# Patient Record
Sex: Female | Born: 1945 | Race: White | Hispanic: No | Smoking: Never smoker
Health system: Southern US, Community
[De-identification: ages and names within clinical notes are randomized; demographics above are authoritative.]

## PROBLEM LIST (undated history)

## (undated) DIAGNOSIS — J449 Chronic obstructive pulmonary disease, unspecified: Secondary | ICD-10-CM

## (undated) DIAGNOSIS — I509 Heart failure, unspecified: Secondary | ICD-10-CM

## (undated) DIAGNOSIS — N289 Disorder of kidney and ureter, unspecified: Secondary | ICD-10-CM

## (undated) DIAGNOSIS — I10 Essential (primary) hypertension: Secondary | ICD-10-CM

## (undated) DIAGNOSIS — I499 Cardiac arrhythmia, unspecified: Secondary | ICD-10-CM

## (undated) DIAGNOSIS — I219 Acute myocardial infarction, unspecified: Secondary | ICD-10-CM

## (undated) DIAGNOSIS — E119 Type 2 diabetes mellitus without complications: Secondary | ICD-10-CM

## (undated) HISTORY — DX: Essential (primary) hypertension: I10

## (undated) HISTORY — PX: ABDOMINAL HYSTERECTOMY: SHX81

## (undated) HISTORY — DX: Heart failure, unspecified: I50.9

## (undated) HISTORY — PX: CARDIAC SURGERY: SHX584

## (undated) HISTORY — DX: Cardiac arrhythmia, unspecified: I49.9

---

## 2004-11-03 ENCOUNTER — Emergency Department: Payer: Self-pay | Admitting: Internal Medicine

## 2004-11-05 ENCOUNTER — Emergency Department: Payer: Self-pay | Admitting: Emergency Medicine

## 2005-02-12 ENCOUNTER — Emergency Department: Payer: Self-pay | Admitting: Internal Medicine

## 2005-03-24 ENCOUNTER — Ambulatory Visit: Payer: Self-pay | Admitting: Family Medicine

## 2005-04-27 ENCOUNTER — Ambulatory Visit: Payer: Self-pay | Admitting: Internal Medicine

## 2006-03-02 ENCOUNTER — Emergency Department: Payer: Self-pay | Admitting: Emergency Medicine

## 2006-09-01 ENCOUNTER — Ambulatory Visit: Payer: Self-pay | Admitting: *Deleted

## 2006-09-14 ENCOUNTER — Inpatient Hospital Stay (HOSPITAL_COMMUNITY): Admission: RE | Admit: 2006-09-14 | Discharge: 2006-09-17 | Payer: Self-pay | Admitting: Cardiology

## 2006-10-04 ENCOUNTER — Encounter: Admission: RE | Admit: 2006-10-04 | Discharge: 2006-10-04 | Payer: Self-pay | Admitting: Neurology

## 2006-10-15 ENCOUNTER — Other Ambulatory Visit: Payer: Self-pay

## 2006-10-15 ENCOUNTER — Ambulatory Visit: Payer: Self-pay | Admitting: Cardiology

## 2006-11-05 ENCOUNTER — Inpatient Hospital Stay (HOSPITAL_COMMUNITY): Admission: RE | Admit: 2006-11-05 | Discharge: 2006-11-06 | Payer: Self-pay | Admitting: Cardiology

## 2008-11-26 DIAGNOSIS — I10 Essential (primary) hypertension: Secondary | ICD-10-CM | POA: Insufficient documentation

## 2009-09-14 ENCOUNTER — Emergency Department: Payer: Self-pay | Admitting: Emergency Medicine

## 2009-09-23 ENCOUNTER — Inpatient Hospital Stay: Payer: Self-pay | Admitting: Specialist

## 2010-11-08 NOTE — Op Note (Signed)
[name], [name]              ACCOUNT NO.:  [id number]   MEDICAL RECORD NO.:  [id number]          PATIENT TYPE:  INP   LOCATION:  6526                         FACILITY:  [facility]   PHYSICIAN:  Cristy Hilts. Jacinto Halim, MD       DATE OF BIRTH:  03/26/1946   DATE OF PROCEDURE:  11/05/2006  DATE OF DISCHARGE:                               OPERATIVE REPORT   PROCEDURES PERFORMED:  1. Right subclavian arteriogram with right vertebral arteriogram.  2. Left subclavian arteriogram.  3. Left vertebral arteriogram.  4. Percutaneous transluminal angioplasty and stenting of the left      subclavian artery.  5. Percutaneous transluminal angioplasty and stenting of the left      vertebral artery.   INDICATIONS:  Andrea [name] is a 65 year old female with a history  of hypertension and hyperlipidemia, who has had symptoms of claudication  in the left upper extremity.  She had previously undergone a selective  left subclavian arteriogram, which had revealed a high-grade stenosis  with a 15 mm pressure gradient across the stenosis.  The stenosis was  very complex, involving the bifurcation of vertebral artery.  She also  has significant symptoms of claudication.  Given this, she was brought  to the catheterization lab with an eye towards percutaneous  revascularization of the left subclavian artery.   Left vertebral artery was compromised with stenting with a high-grade  90% stenosis.  Given this, we proceeded to electively stent the left  vertebral artery.   OPERATOR:  1. Primary operator Cristy Hilts. Jacinto Halim, MD, for angiography.  2. Primary operator for left vertebral stenting:  Nanetta Batty, M.D.  3. Proctoring physician is Grandville Silos. Corliss Skains, M.D.   ANGIOGRAPHIC DATA:  Right vertebral artery:  Right vertebral artery is a  large vessel and is dominant vessel.  Right subclavian artery is widely  patent.   Left vertebral artery:  Left vertebral artery is smaller and appears to  be nondominant.   However, there is a ostial 40% stenosis.  Following  balloon angioplasty, there was a high-grade 90% stenosis.   Left subclavian arteriogram:  Left subclavian arteriogram revealed a 70%  stenosis of the left subclavian artery.  The LIMA arose in the usual  fashion.  The stenosis was in the body of the subclavian artery.   INTERVENTION DATA:  Successful PTA and stenting of the left subclavian  artery with a 7.0 x 24 mm Genesis stent, which was deployed at 11  atmospheres for 42 seconds.  Post stent deployment, stenosis was reduced  to 0%.  However, there was a 90% stenosis noted in the left vertebral  artery.   Successful PTA and stenting of the left vertebral artery with  implantation of a 3.5 x 8-mm Vision performed at 10 atmospheres pressure  for 30 seconds.  Post stent deployment 0% residual stenosis was noted.  There was a spasm noted in the midsegment of the vertebral artery  secondary to the wire; however, this was all.  There was no evidence of  any dissection.   RECOMMENDATIONS:  The patient will be continued on aggressive risk  modification.   A total of 245 mL of contrast was utilized for diagnostic angiography.   TECHNIQUE OF THE PROCEDURE:  Diagnostic cardiac catheterization using a  7-French long bright tip 90 cm sheath.  This sheath was carefully  advanced over a Wholey wire into the left subclavian artery using a 5-  [place] JR-4 diagnostic catheter.  Then angiography was performed.  Then  we proceeded to electively stent the left subclavian artery.  This was  done over the [facility] wire and a 7.0 x 24 mm Genesis stent was advanced  at the site of the lesion and the stent was deployed at 11 atmospheres  pressure for 42 seconds.  Then post stent deployment angiography  revealed a high-grade stenosis of the right vertebral artery.  Then  intra-arterial nitroglycerin was administered and the attention was  directed towards the right subclavian artery and the right  vertebral  artery.  Angiography of the right vertebral artery was performed.  Having confirmed that the right vertebral artery was probably the  dominant artery, we proceeded back to re-look at the left vertebral  artery.  Again the stenosis was persistent.  Hence with the help of a  0.014-inch [employer] we initially attempted to stent it with a 3.5  x 12 mm Vision but because of inability to advance the stent through the  stent strut of the left subclavian artery, we used a 3.0 x 8 mm Express  balloon and a gentle balloon dilatation at 8 atmospheres pressure for 55  seconds was performed and angiography was repeated.  There was  persistence of stenosis.  Hence, we opted to proceed with stenting of  the left vertebral artery.  This time a 3.5 x 8-mm Vision stent was  advanced into the left vertebral artery and after positioning the stent,  the stent was deployed at 10 atmospheres pressure for 30 seconds.  Again  200 mcg of intra-arterial nitroglycerin was also administered.  Angiography was repeated.  The wire-induced spasm was noted in the body  of the vertebral artery.  However, there was no evidence of dissection.  Hence, we felt that this was safe enough to leave this alone.  Overall  the patient tolerated the procedure well.  No immediate complications  were noted.  The left bright tip sheath was exchanged to a short 7-  French sheath and the sheath was sutured in place.  During the procedure  heparin was utilized for anticoagulation.   We thank Dr. Kerby Nora for proctoring the procedure.      Cristy Hilts. Jacinto Halim, MD  Electronically Signed     JRG/MEDQ  D:  11/05/2006  T:  11/06/2006  Job:  [id number]

## 2010-11-11 NOTE — Discharge Summary (Signed)
NAMELAPORSCHE, HOEGER              ACCOUNT NO.:  [id number]   MEDICAL RECORD NO.:  [id number]          PATIENT TYPE:  INP   LOCATION:  2040                         FACILITY:  [facility]   PHYSICIAN:  Cristy Hilts. Jacinto Halim, MD       DATE OF BIRTH:  10-Dec-1945   DATE OF ADMISSION:  09/13/2006  DATE OF DISCHARGE:                               DISCHARGE SUMMARY   HISTORY OF PRESENT ILLNESS:  Andrea [name] is a 65 year old female patient  of Dr. Yates Decamp and Vonita Moss who had been seen in the office on  September 04, 2006.  She has no history of previous coronary artery disease.  She had been having some TIA symptoms.  She has undergone a Myoview test  on August 31, 2006, which revealed mid anteroseptal and apical ischemia.  Thus, it was decided she should undergo cardiac cath for further  evaluation.  This was performed on September 13, 2006, by Dr. Yates Decamp.  She had 80% blockage in a proximal RCA, 80% in her proximal circumflex.  She underwent PTA and stenting with a 3.0 x 24 Endeavor DES to her RCA,  reduced from 80% to 0%.  It was decided that she should undergo PCI of  her circumflex electively.  She also was noted to have a 70% left  subclavian stenosis.  She was given Integrilin and heparin.  Postprocedure, she had a groin hematoma.  She was Star-Closed.  She was  kept in the hospital over the weekend.  An abdominal CT was done because  of suspicion of a retroperitoneal bleed.  She had a rectus sheath  hematoma by CT scan.  On September 17, 2006, she was up walking the halls.  She was feeling well.  She did receive 2 units of packed red blood cells  on September 15, 2006, with stabilization of her hemoglobin.  On the day of  discharge, it was 10.1.  Hematocrit was 29.7.  She was seen by Dr. Jacinto Halim  and considered stable for discharge home.   LABS:  Sodium 136, potassium 4.3, glucose 173, BUN 15, creatinine 0.83.  Total protein was 5.5, albumin 2.7.  SGOT was 20.  SGPT was 19.  September 16, 2006:  Hemoglobin  10.3, hematocrit 31.3, WBC 7.8.  Platelets were  153.  On September 15, 2006, her hemoglobin was 8.0.  Hematocrit was 23.8.   Discharge medications are:  1. Metformin 1 gram twice daily.  2. Aspirin 81 mg a day.  3. Plavix 75 mg a day.  4. Lipitor 80 mg a day.  5. Prinivil 10 mg daily.  6. Ferrous sulfate 325 mg twice a day.  7. Prilosec 20 mg daily.  8. Lutein 20 mg and PreserVision daily.  9. Nitroglycerin 1/150 under the tongue as needed for chest pain.   CT of the abdomen, I do not have.  It is not in the computer at this  time.  However, I was told it showed a rectus sheath hematoma.   DISCHARGE DIAGNOSES:  1. Coronary artery disease status post positive Cardiolite with      subsequent  cardiac catheterization revealing 2-vessel disease in      her RCA and circumflex.  She had RCA DES placed.  2. 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  3. Residual coronary artery disease with plans to have elective      circumflex stents placed and also left subclavian intervention.  4. Post-procedure hematoma.  5. Blood loss anemia treated with 2 units of packed red blood cells.      Also, additional ferrous sulfate given.  6. History of hypertension.  Has been on the low side in the hospital,      probably related to her anemia.  7. Noninsulin-dependent diabetes mellitus.  8. 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      Lezlie Octave, N.P.      Cristy Hilts. Jacinto Halim, MD  Electronically Signed    BB/MEDQ  D:  09/17/2006  T:  09/17/2006  Job:  [id number]   cc:   Vonita Moss, Dr.  Sunny Schlein. Pearlean Brownie, MD  Cristy Hilts Jacinto Halim, MD

## 2010-11-11 NOTE — Discharge Summary (Signed)
Andrea [name], Andrea [name]              ACCOUNT NO.:  [id number]   MEDICAL RECORD NO.:  [id number]          PATIENT TYPE:  INP   LOCATION:  6526                         FACILITY:  [facility]   PHYSICIAN:  Cristy Hilts. Jacinto Halim, MD       DATE OF BIRTH:  14-Oct-1945   DATE OF ADMISSION:  11/05/2006  DATE OF DISCHARGE:  11/06/2006                               DISCHARGE SUMMARY   DISCHARGE DIAGNOSES:  1. Claudication in the left upper extremity.  2. Right and left subclavian arteriogram.  3. Right and left vertebral arteriogram.  4. Status post percutaneous transluminal coronary angioplasty and      stenting of the left subclavian artery.  5. Status post percutaneous transluminal coronary angioplasty and      stenting of the left vertebral artery.  6. Dyslipidemia.  7. Hypertension.  8. Coronary artery disease.  9. 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  10.Patent foramen ovale.   PROCEDURE:  Arteriogram was performed on the right and left subclavian  artery as well as the right and left vertebral artery.  The right  vertebral artery was large, right subclavian widely patent.  The left  vertebral artery was small and had an ostial 40% lesion.  Following  balloon angioplasty there was a high-grade 90% stenosis in the left  vertebral artery.   DICTATION ENDED AT THIS POINT.     ______________________________  Andrea Muff, NP      Cristy Hilts Jacinto Halim, MD  Electronically Signed    LS/MEDQ  D:  12/11/2006  T:  12/12/2006  Job:  [id number]

## 2010-11-11 NOTE — Discharge Summary (Signed)
Andrea [name], Andrea [name]              ACCOUNT NO.:  [id number]   MEDICAL RECORD NO.:  [id number]          PATIENT TYPE:  INP   LOCATION:  6526                         FACILITY:  [facility]   PHYSICIAN:  Cristy Hilts. Jacinto Halim, MD       DATE OF BIRTH:  11/24/1945   DATE OF ADMISSION:  11/05/2006  DATE OF DISCHARGE:  11/06/2006                               DISCHARGE SUMMARY   PRIMARY CARE PHYSICIAN:  Mark Crissmon.   DISCHARGE DIAGNOSES:  1. Left upper extremity claudication.  2. Known high-grade stenosis in the left subclavian involving the      bifurcation of vertebral artery.  3. History of coronary artery disease.  4. Dyslipidemia.  5. Hypertension.   PROCEDURE:  Right and left subclavian arteriogram with right and left  vertebral arteriogram.  These studies were performed on Nov 05, 2006 by  Dr. Yates Decamp.  Complications - none.  The right subclavian artery was  widely patent.  The left subclavian artery revealed a 70% stenosis of  the left subclavian artery.  The left vertebral artery was small with an  ostial 40% lesion, as well as a high-grade 90% stenosis.   INTERVENTION:  Successful PTA and stenting was performed on the left  subclavian artery with a 7.0 x 24 mm Genesis stent.  After that  intervention was complete, a 90% stenosis was noted in the left  vertebral artery.  Therefore, PTA and stenting was performed on the left  vertebral artery with placement of a 3.5 x 8 mm Vision stent.  Complications - none.   BRIEF HISTORY:  Andrea [name] is a very pleasant 65 year old female with a  history of dyslipidemia and hypertension, as well as coronary artery  disease and known peripheral vascular disease.  She reported symptoms of  claudication in the left upper extremity, and there was a known high-  grade stenosis in the left subclavian artery involving the bifurcation  of the vertebral artery.  Given her symptoms and known blockage, she was  taken to the cardiac catheterization suite with  plans for percutaneous  revascularization of the left subclavian artery.   HOSPITAL COURSE:  Andrea [name] was admitted to [facility] and  taken to the cardiac catheterization suite for the above-described  procedure.  She tolerated the arteriogram, as well as the intervention  of her left subclavian and left vertebral arteries, without  complication.  Post procedure labs revealed a hemoglobin of 11.5 and a  hematocrit of 34.2.  Sodium of 135, potassium of 4.1 and was glucose of  228, BUN of 9 and creatinine of 0.82.  Her claudication symptoms  improved significantly, and she had no complaints on the day of  discharge.  Overall, she was stable and discharge was recommended at  this time.   ACTIVITY:  She is to increase her activity slowly and may walk up steps  with assistance.  She may shower or bathe later on the day of discharge.  No lifting for 2 weeks.  No driving for the next 2 days.   DISCHARGE INSTRUCTIONS:  She is to keep her groin  site clean and dry and  call us with any redness, swelling or drainage from the site.  Our  office will schedule upper extremity Doppler studies and schedule a  follow up appointment with Dr. Jacinto Halim.  They will call the patient with  the specific date and time.   DISCHARGE MEDICATIONS:  1. Aspirin 325 mg daily.  2. Plavix 75 mg daily.  3. Metformin 1000 mg b.i.d. (she is to restart that on Nov 08, 2006).  4. Lutein 20 mg daily.  5. PreserVision one daily.  6. Lipitor 80 mg daily.  7. Lisinopril 10 mg daily (she is to restart that on Nov 08, 2006).  8. Nexium 40 mg daily.  9. Iron 325 daily.  10.Nitrostat 0.4 mg under tongue p.r.n. chest pain.     ______________________________  Charmian Muff, NP      Cristy Hilts. Jacinto Halim, MD  Electronically Signed    LS/MEDQ  D:  12/18/2006  T:  12/19/2006  Job:  [id number]   cc:   Tama Gander(?), M.D.

## 2010-11-11 NOTE — Cardiovascular Report (Signed)
NAMEGIAVANA, Andrea [name] NO.:  [id number]   MEDICAL RECORD NO.:  [id number]          PATIENT TYPE:  OBV   LOCATION:  2807                         FACILITY:  [facility]   PHYSICIAN:  Cristy Hilts. Jacinto Halim, MD       DATE OF BIRTH:  [birth year]   DATE OF PROCEDURE:  DATE OF DISCHARGE:                            CARDIAC CATHETERIZATION   DATE OF PROCEDURE:  09/13/06.   CONSULTATION:  Vonita Moss, MD.   PROCEDURE PERFORMED:  1. Left ventriculography.  2. Selective right and left coronary arteriography.  3. Arch aortogram.  4. Left subclavian arteriography.  5. Abdominal aortogram.  6. PTCA and stenting of the right coronary artery.  7. Closure of right femoral arterial access with star close.   INDICATIONS:  Ms. Andrea [name] is a 65 year old female who was  referred with symptoms suggestive of TIA.  She has multiple  cardiovascular risk factors that include hypertension, hyperlipidemia,  prior smoking.  She had undergone a stress Myoview in the outpatient  setting and this had revealed anterior wall ischemia.  Given that she  was brought to the cardiac catheter lab to evaluate her coronary  anatomy.  Left subclavian arteriography was performed because of  symptomatic left subclavian artery stenosis with symptoms of  claudication and wasting of her left upper extremity muscle mass.  Abdominal aortogram was performed  for abdominal atherosclerosis and  renal artery stenosis.   HEMODYNAMIC DATA:  The left ventricular pressure 144/2 with an end-  diastolic pressure of  79 mmHg.  Aortic pressure 144/71 with a mean of  102 mmHg.  There is no significant pressure gradient across the aortic  valve.   ANGIOGRAPHIC DATA:  Left ventricular systolic function was preserved  with ejection fraction of 50-55% with mild global hypokinesis.  There is  inferior and interseptal hypokinesis.  There is no significant mitral  regurgitation.  Right coronary artery is a moderate caliber vessel  which  is the dominant vessel.  There is a high-grade 80% stenosis in the mid  segment of right coronary artery.  There is a tandem 40-50% stenosis.  It gives origin to a moderate-sized PDA and a moderate-sized PLA branch.  He has got mild diffuse disease.   Left main.  The left main is large , smooth and normal.   Circumflex.  Circumflex has a proximal 80% stenosis.  It gives origin to  small OM1.  At the stenotic segment, it also gives origin to a conus  branch.   LAD is a large vessel.  Gives origin to several small to moderate sized  diagonals.  It has got mild luminal irregularity.   Abdominal aortogram. Abdominal gram revealed no evidence abdominal  aortic aneurysm.  No significant abdominal calcification.  The right  renal artery, there was one right renal artery which is widely patent  and there was too small left renal arteries that were patent.   Arteriogram. Arteriogram revealed type A arch.  There was right  innominate artery gives origin to the right internal, right common  carotid and right vertebral artery.  The right vertebral artery appeared  to be much larger than the left.  They are smooth and normal.   Left common carotid also arise from the arch and appeared to be widely  patent in its proximal segment.   Left subclavian artery.  The left subclavian artery is a complex 70-80%  stenosis in the proximal and mid segment.  The stenosis extends to the  origin of the vertebral artery which has an ostial 20% stenosis.  LIMA  is widely patent.  There was a 50 mm pressure gradient across the left  subclavian artery stenosis.   INTERVENTION DATA:  Successful PTCA and stenting of the proximal and mid  segment of the right coronary artery with a 3.0 x 24 mm endeaver stent  was deployed at 12 atmospheres of pressure and post dilated with a 3.25  x 15-mm Quantum at 14 atmospheres pressure.  Overall the stenosis was  reduced from 80% to 0% with brisk TIMI III to TIMI III  flow maintained  in the procedure.   COMPLICATIONS DURING PROCEDURE:  The patient did develop a small  hematoma after access was closed with Star close.   RECOMMENDATIONS:  The patient will be continued with very close  observation.  Probably will be put in the transitional care unit and  hemoglobin will be closely watched.  She will be brought back for an  elective PCI of the circumflex coronary artery.   A total of 275 mL of contrast was utilized for diagnostic angiography.   The subclavian artery stenosis appears to be pretty complex but she  probably will benefit from subclavian angioplasty.  I reviewed these  films with my colleagues and will probably perform the procedure at a  later date.   TECHNIQUE OF PROCEDURE:  Usual sterile precautions using a 6-French  right femoral artery access a 6-French multipurpose B2 catheter was  advanced into the ascending aorta over 0.035 inches J-wire.  The  catheter was gently advanced into left ventricle and LV angio done both  in RAO and LAO projection.  The catheter was flushed with saline, pulled  back into the ascending aorta and pressure gradient across the aortic  valve was monitored.  Right coronary artery selective engaged and  angiography was performed.  Then the left main coronary artery was  engaged.  Angiography was performed.  Then the catheter was pulled back  into the arch of the aorta and arch aortogram was performed.   Left subclavian arteriography was performed by selective engagement of  the left subclavian artery.  Hemodynamics was also monitored.  Then the  catheter was pulled back in the abdominal and abdominal aortogram was  performed.   TECHNIQUE OF INTERVENTION:  Using intergrellin plus Heparin and using a  7-French FR-4 guide right coronary artery selectively and angiography  was performed.  200 mcg ntg ic was also administered.  Then I proceeded direct stenting with a 3.0 x 24 mm endeavor DES stent which was a  3.0 x  24 mm in length which was deployed into the proximal and mid segment of  right coronary artery at 12 atmospheres pressure.  There was a waste  noted in the proximal tightest segment which was post dilated with a  3.25 x 15-mm Quantum at 14 atmospheres pressure.  Post angioplasty  excellent results were noted.  Distally there was a tandem 50% stenosis  now appeared to be 50-60%, however, the transition was very smooth and  without any evidence of dissection or thrombus in terms of procedure.  The patient tolerated the procedure.   Right femoral angiography was performed through the arterial access  sheath and access was closed with Star close, however, the patient did  develop a complication of a moderate-to-large size hematoma.      Cristy Hilts. Jacinto Halim, MD  Electronically Signed     JRG/MEDQ  D:  09/13/2006  T:  09/13/2006  Job:  [id number]

## 2010-11-11 NOTE — Discharge Summary (Signed)
NAMEDAENA, ALPER NO.:  [id number]   MEDICAL RECORD NO.:  [id number]          PATIENT TYPE:  INP   LOCATION:  2040                         FACILITY:  [facility]   PHYSICIAN:  Lezlie Octave, N.P.     DATE OF BIRTH:  1946/04/25   DATE OF ADMISSION:  09/13/2006  DATE OF DISCHARGE:                               DISCHARGE SUMMARY   ADDENDUM  Upon discharge it was noted that a urine clean catch had been done on  September 15, 2006.  It apparently grew E. coli greater than 100,000 with  sensitive trimethoprim-sulfamethoxazole.  Thus, she was placed on Septra  DS 1 p.o. b.i.d. x7 days at the time of discharge.      Lezlie Octave, N.P.     BB/MEDQ  D:  09/17/2006  T:  09/17/2006  Job:  [id number]   cc:   Dr. Pearlean Brownie

## 2011-02-16 DIAGNOSIS — C50919 Malignant neoplasm of unspecified site of unspecified female breast: Secondary | ICD-10-CM | POA: Insufficient documentation

## 2011-10-26 ENCOUNTER — Emergency Department: Payer: Self-pay | Admitting: Unknown Physician Specialty

## 2012-10-11 DIAGNOSIS — I2510 Atherosclerotic heart disease of native coronary artery without angina pectoris: Secondary | ICD-10-CM | POA: Insufficient documentation

## 2012-10-22 DIAGNOSIS — K219 Gastro-esophageal reflux disease without esophagitis: Secondary | ICD-10-CM | POA: Insufficient documentation

## 2013-04-04 DIAGNOSIS — R198 Other specified symptoms and signs involving the digestive system and abdomen: Secondary | ICD-10-CM | POA: Insufficient documentation

## 2013-09-01 DIAGNOSIS — M25551 Pain in right hip: Secondary | ICD-10-CM | POA: Insufficient documentation

## 2013-09-01 DIAGNOSIS — G8929 Other chronic pain: Secondary | ICD-10-CM | POA: Insufficient documentation

## 2014-04-14 DIAGNOSIS — Z87898 Personal history of other specified conditions: Secondary | ICD-10-CM | POA: Insufficient documentation

## 2016-06-28 ENCOUNTER — Encounter: Payer: Self-pay | Admitting: Emergency Medicine

## 2016-06-28 ENCOUNTER — Emergency Department: Payer: Medicare HMO

## 2016-06-28 ENCOUNTER — Emergency Department
Admission: EM | Admit: 2016-06-28 | Discharge: 2016-06-28 | Disposition: A | Discharge status: Home / Self Care | Payer: Medicare HMO | Attending: Emergency Medicine | Admitting: Emergency Medicine

## 2016-06-28 DIAGNOSIS — W19XXXA Unspecified fall, initial encounter: Secondary | ICD-10-CM

## 2016-06-28 DIAGNOSIS — S199XXA Unspecified injury of neck, initial encounter: Secondary | ICD-10-CM | POA: Diagnosis present

## 2016-06-28 DIAGNOSIS — X501XXA Overexertion from prolonged static or awkward postures, initial encounter: Secondary | ICD-10-CM | POA: Diagnosis not present

## 2016-06-28 DIAGNOSIS — S161XXA Strain of muscle, fascia and tendon at neck level, initial encounter: Secondary | ICD-10-CM | POA: Diagnosis not present

## 2016-06-28 DIAGNOSIS — Y999 Unspecified external cause status: Secondary | ICD-10-CM | POA: Diagnosis not present

## 2016-06-28 DIAGNOSIS — Y92481 Parking lot as the place of occurrence of the external cause: Secondary | ICD-10-CM | POA: Insufficient documentation

## 2016-06-28 DIAGNOSIS — R42 Dizziness and giddiness: Secondary | ICD-10-CM | POA: Diagnosis not present

## 2016-06-28 DIAGNOSIS — J449 Chronic obstructive pulmonary disease, unspecified: Secondary | ICD-10-CM | POA: Diagnosis not present

## 2016-06-28 DIAGNOSIS — Y9389 Activity, other specified: Secondary | ICD-10-CM | POA: Diagnosis not present

## 2016-06-28 DIAGNOSIS — S39012A Strain of muscle, fascia and tendon of lower back, initial encounter: Secondary | ICD-10-CM | POA: Insufficient documentation

## 2016-06-28 DIAGNOSIS — S7001XA Contusion of right hip, initial encounter: Secondary | ICD-10-CM | POA: Diagnosis not present

## 2016-06-28 DIAGNOSIS — I2510 Atherosclerotic heart disease of native coronary artery without angina pectoris: Secondary | ICD-10-CM | POA: Insufficient documentation

## 2016-06-28 DIAGNOSIS — E119 Type 2 diabetes mellitus without complications: Secondary | ICD-10-CM | POA: Insufficient documentation

## 2016-06-28 HISTORY — DX: Chronic obstructive pulmonary disease, unspecified: J44.9

## 2016-06-28 HISTORY — DX: Disorder of kidney and ureter, unspecified: N28.9

## 2016-06-28 HISTORY — DX: Type 2 diabetes mellitus without complications: E11.9

## 2016-06-28 HISTORY — DX: Acute myocardial infarction, unspecified: I21.9

## 2016-06-28 LAB — BASIC METABOLIC PANEL
ANION GAP: 8 (ref 5–15)
BUN: 22 mg/dL — AB (ref 6–20)
CO2: 29 mmol/L (ref 22–32)
Calcium: 9.8 mg/dL (ref 8.9–10.3)
Chloride: 103 mmol/L (ref 101–111)
Creatinine, Ser: 1.31 mg/dL — ABNORMAL HIGH (ref 0.44–1.00)
GFR calc Af Amer: 47 mL/min — ABNORMAL LOW (ref >60)
GFR, EST NON AFRICAN AMERICAN: 41 mL/min — AB (ref >60)
GLUCOSE: 222 mg/dL — AB (ref 65–99)
POTASSIUM: 3.9 mmol/L (ref 3.5–5.1)
Sodium: 140 mmol/L (ref 135–145)

## 2016-06-28 LAB — CBC
HEMATOCRIT: 37.6 % (ref 35.0–47.0)
Hemoglobin: 12.6 g/dL (ref 12.0–16.0)
MCH: 29.8 pg (ref 26.0–34.0)
MCHC: 33.4 g/dL (ref 32.0–36.0)
MCV: 89.1 fL (ref 80.0–100.0)
PLATELETS: 173 10*3/uL (ref 150–440)
RBC: 4.22 MIL/uL (ref 3.80–5.20)
RDW: 14.5 % (ref 11.5–14.5)
WBC: 9.1 10*3/uL (ref 3.6–11.0)

## 2016-06-28 MED ORDER — OXYCODONE HCL 5 MG PO TABS: 5.0000 mg | ORAL_TABLET | Freq: Four times a day (QID) | ORAL | 0 refills | Status: AC | PRN

## 2016-06-28 MED ORDER — FENTANYL CITRATE (PF) 100 MCG/2ML IJ SOLN
75.0000 ug | Freq: Once | INTRAMUSCULAR | Status: AC
  Administered 2016-06-28: 75 ug via INTRAVENOUS
  Filled 2016-06-28: qty 2

## 2016-06-28 MED ORDER — OXYCODONE-ACETAMINOPHEN 5-325 MG PO TABS
ORAL_TABLET | ORAL | Status: AC
  Administered 2016-06-28: 1 via ORAL
  Filled 2016-06-28: qty 1

## 2016-06-28 MED ORDER — OXYCODONE-ACETAMINOPHEN 5-325 MG PO TABS
1.0000 | ORAL_TABLET | Freq: Once | ORAL | Status: AC
  Administered 2016-06-28: 1 via ORAL

## 2016-06-28 MED ORDER — SODIUM CHLORIDE 0.9 % IV BOLUS (SEPSIS)
1000.0000 mL | Freq: Once | INTRAVENOUS | Status: AC
  Administered 2016-06-28: 1000 mL via INTRAVENOUS

## 2016-06-28 NOTE — ED Notes (Signed)
Patient transported to X-ray 

## 2016-06-28 NOTE — ED Notes (Signed)

## 2016-06-28 NOTE — ED Notes (Signed)
ED Provider at bedside. 

## 2016-06-28 NOTE — ED Notes (Signed)
Pt attempted to ambulate to toilet, unable to ambulate, reports pain increases when attempting to ambulate.

## 2016-06-28 NOTE — Discharge Instructions (Signed)
You may apply a heating pad to your low back, neck, and your right hip for 10-15 minutes every 2-3 hours. If ice packs feel better, you can try ice instead. You may take Tylenol for mild to moderate pain, and oxycodone for severe pain. Do not drive within 8 hours of taking oxycodone. Do not take Motrin, Advil, ibuprofen, or any other NSAID medications as your kidney function is abnormal and this can worsen your kidney function.  Return to the emergency department if you develop severe pain, numbness tingling or weakness, inability to walk, severe headache, vomiting, or any other symptoms concerning to you.

## 2016-06-28 NOTE — ED Provider Notes (Signed)
[facility] Emergency Department Provider Note  ____________________________________________  Time seen: Approximately 4:07 PM  I have reviewed the triage vital signs and the nursing notes.   HISTORY  Chief Complaint Fall    HPI Andrea [name] is a 71 y.o. female with a history of COPD, DM, CAD status post MI, presenting for mechanical fall with resulting right hip pain. The patient reports that she was pushing a cart up hill in a parking lot when she got "twisted around" and lost her balance.This resulted in her falling, and she was unable to get up without assistance. When she tried to bear weight, she had excruciating pain in the right hip resulting in a lightheaded feeling. She also intermittently had some low back pain and neck pain, which she has had in the past and is not experiencing at this time. She may have hit her head, but did not lose consciousness. She is not having any headache, nausea or vomiting, numbness tingling or weakness. She has no associated chest pain, shortness of breath. The patient is not anticoagulated other than a low dose daily aspirin.   Past Medical History:  Diagnosis Date  . COPD (chronic obstructive pulmonary disease) ([place])   . Diabetes mellitus without complication ([place])   . Heart attack   . Renal disorder     There are no active problems to display for this patient.   Past Surgical History:  Procedure Laterality Date  . ABDOMINAL HYSTERECTOMY    . CARDIAC SURGERY     stents      Allergies Metformin and related and Sulfur  No family history on file.  Social History Social History  Substance Use Topics  . Smoking status: Never Smoker  . Smokeless tobacco: Never Used  . Alcohol use No    Review of Systems Constitutional: No fever/chills.No lightheadedness or dizziness. No syncope. Positive mechanical fall. Eyes: No visual changes. No blurred or double vision. ENT: No sore throat. No congestion or  rhinorrhea. No dental injury. Cardiovascular: Denies chest pain. Denies palpitations. Respiratory: Denies shortness of breath.  No cough. Gastrointestinal: No abdominal pain.  No nausea, no vomiting.  No diarrhea.  No constipation. Genitourinary: Negative for dysuria. Musculoskeletal: Positive for back pain. Positive for neck pain. Both neck and back pain are resolved at this point. Positive for right hip pain. Differential inability to bear weight due to right hip pain. Skin: Negative for rash. Neurological: Negative for headaches. No focal numbness, tingling or weakness.   10-point ROS otherwise negative.  ____________________________________________   PHYSICAL EXAM:  VITAL SIGNS: ED Triage Vitals  Enc Vitals Group     BP 06/28/16 1405 (!) 157/59     Pulse Rate 06/28/16 1405 69     Resp 06/28/16 1405 15     Temp 06/28/16 1405 97.8 F (36.6 C)     Temp Source 06/28/16 1405 Oral     SpO2 06/28/16 1405 99 %     Weight 06/28/16 1406 160 lb (72.6 kg)     Height 06/28/16 1406 5\' 2"  (1.575 m)     Head Circumference --      Peak Flow --      Pain Score 06/28/16 1406 7     Pain Loc --      Pain Edu? --      Excl. in [place]? --     Constitutional: Alert and oriented. Mildly uncomfortable appearing but  in no acute distress. Answers questions appropriately. Eyes: Conjunctivae are normal.  EOMI.  No scleral icterus. Head: Atraumatic. No raccoon eyes. No Battle sign. Nose: No congestion/rhinnorhea. No swelling over the nose. No septal hematoma. Mouth/Throat: Mucous membranes are moist. No dental injury or malocclusion. Neck: No stridor.  Supple.  No midline C-spine tenderness to palpation, step-offs or deformities. Cardiovascular: Normal rate, regular rhythm. No murmurs, rubs or gallops.  Respiratory: Normal respiratory effort.  No accessory muscle use or retractions. Lungs CTAB.  No wheezes, rales or ronchi. Gastrointestinal: Soft, nontender and nondistended.  No guarding or rebound.  No  peritoneal signs. Musculoskeletal: Full range of motion without pain of the bilateral shoulders, elbows, wrists, ankles, left knee, left hip. The right hip is tender over the greater trochanter, the patient is unable to give a full knee exam due to the pain in the right hip. Normal DP and PT pulses bilaterally. Normal sensation to light touch throughout the bilateral lower extremities. The legs are symmetric in length. Neurologic:  A&Ox3.  Speech is clear.  Face and smile are symmetric.  EOMI.  Moves all extremities well. Skin:  Skin is warm, dry and intact. No rash noted. Psychiatric: Mood and affect are normal. Speech and behavior are normal.  Normal judgement.  ____________________________________________   LABS (all labs ordered are listed, but only abnormal results are displayed)  Labs Reviewed  BASIC METABOLIC PANEL - Abnormal; Notable for the following:       Result Value   Glucose, Bld 222 (*)    BUN 22 (*)    Creatinine, Ser 1.31 (*)    GFR calc non Af Amer 41 (*)    GFR calc Af Amer 47 (*)    All other components within normal limits  CBC   ____________________________________________  EKG  ED ECG REPORT I, Eula Listen, the attending physician, personally viewed and interpreted this ECG.   Date: 06/28/2016  EKG Time: 1404  Rate: 73  Rhythm: normal sinus rhythm  Axis: Normal  Intervals:none  ST&T Change: Nonspecific T-wave inversions in V1, no ST elevation. No ischemic changes.  ____________________________________________  RADIOLOGY  Dg Lumbar Spine 2-3 Views  Result Date: 06/28/2016 CLINICAL DATA:  Fall in parking lot today onto right side. Low back pain. EXAM: LUMBAR SPINE - 2-3 VIEW COMPARISON:  CT 09/16/2006 FINDINGS: Degenerative disc disease at L4-5 and L5-S1. Normal alignment. No fracture. SI joints are symmetric and unremarkable. IMPRESSION: Degenerative changes.  No acute findings. Electronically Signed   By: Rolm Baptise M.D.   On: 06/28/2016  16:55   Ct Head Wo Contrast  Result Date: 06/28/2016 CLINICAL DATA:  Fall, hit back of head EXAM: CT HEAD WITHOUT CONTRAST CT CERVICAL SPINE WITHOUT CONTRAST TECHNIQUE: Multidetector CT imaging of the head and cervical spine was performed following the standard protocol without intravenous contrast. Multiplanar CT image reconstructions of the cervical spine were also generated. COMPARISON:  MRI 10/04/2009 FINDINGS: CT HEAD FINDINGS Brain: No acute intracranial abnormality. Specifically, no hemorrhage, hydrocephalus, mass lesion, acute infarction, or significant intracranial injury. Vascular: No hyperdense vessel or unexpected calcification. Skull: No acute calvarial abnormality. Sinuses/Orbits: Visualized paranasal sinuses and mastoids clear. Orbital soft tissues unremarkable. Other: None CT CERVICAL SPINE FINDINGS Alignment: Normal Skull base and vertebrae: No acute fracture. No primary bone lesion or focal pathologic process. Soft tissues and spinal canal: No prevertebral fluid or swelling. No visible canal hematoma. Disc levels: Disc space narrowing at C5-6 and C6-7 with mild anterior spurring. Upper chest: Negative. Other: None IMPRESSION: No acute intracranial abnormality. No acute bony abnormality in the cervical spine. Electronically Signed  By: Rolm Baptise M.D.   On: 06/28/2016 16:51   Ct Cervical Spine Wo Contrast  Result Date: 06/28/2016 CLINICAL DATA:  Fall, hit back of head EXAM: CT HEAD WITHOUT CONTRAST CT CERVICAL SPINE WITHOUT CONTRAST TECHNIQUE: Multidetector CT imaging of the head and cervical spine was performed following the standard protocol without intravenous contrast. Multiplanar CT image reconstructions of the cervical spine were also generated. COMPARISON:  MRI 10/04/2009 FINDINGS: CT HEAD FINDINGS Brain: No acute intracranial abnormality. Specifically, no hemorrhage, hydrocephalus, mass lesion, acute infarction, or significant intracranial injury. Vascular: No hyperdense vessel or  unexpected calcification. Skull: No acute calvarial abnormality. Sinuses/Orbits: Visualized paranasal sinuses and mastoids clear. Orbital soft tissues unremarkable. Other: None CT CERVICAL SPINE FINDINGS Alignment: Normal Skull base and vertebrae: No acute fracture. No primary bone lesion or focal pathologic process. Soft tissues and spinal canal: No prevertebral fluid or swelling. No visible canal hematoma. Disc levels: Disc space narrowing at C5-6 and C6-7 with mild anterior spurring. Upper chest: Negative. Other: None IMPRESSION: No acute intracranial abnormality. No acute bony abnormality in the cervical spine. Electronically Signed   By: Rolm Baptise M.D.   On: 06/28/2016 16:51   Ct Hip Right Wo Contrast  Result Date: 06/28/2016 CLINICAL DATA:  Pain after fall. EXAM: CT OF THE RIGHT HIP WITHOUT CONTRAST TECHNIQUE: Multidetector CT imaging of the right hip was performed according to the standard protocol. Multiplanar CT image reconstructions were also generated. COMPARISON:  None. FINDINGS: Bones/Joint/Cartilage No fracture of the right hip. Mild posterior hip joint space narrowing which suggests probable mild cartilaginous thinning posteriorly. Deep right superior and inferior pubic rami as well as acetabulum appears intact. Ligaments Suboptimally assessed by CT. Muscles and Tendons Nonacute Soft tissues Surgical clips noted along the right external iliac artery and vein. Mild soft tissue induration along the lateral aspect of the right hip that soft tissue hematoma. No intramuscular hematoma. The visualized bladder is nonacute. Hysterectomy. No adnexal mass. IMPRESSION: Mild soft tissue contusion overlying the right hip without acute underlying osseous abnormality or malalignment. Electronically Signed   By: Ashley Royalty M.D.   On: 06/28/2016 17:50   Dg Hip Unilat W Or Wo Pelvis 2-3 Views Right  Result Date: 06/28/2016 CLINICAL DATA:  Fall in parking lot today onto right side. Right lateral hip pain.  EXAM: DG HIP (WITH OR WITHOUT PELVIS) 2-3V RIGHT COMPARISON:  None. FINDINGS: There is no evidence of hip fracture or dislocation. There is no evidence of arthropathy or other focal bone abnormality. IMPRESSION: Negative. Electronically Signed   By: Rolm Baptise M.D.   On: 06/28/2016 16:54    ____________________________________________   PROCEDURES  Procedure(s) performed: None  Procedures  Critical Care performed: No ____________________________________________   INITIAL IMPRESSION / ASSESSMENT AND PLAN / ED COURSE  Pertinent labs & imaging results that were available during my care of the patient were reviewed by me and considered in my medical decision making (see chart for details).  71 y.o. female s/p mechanical fall with R hip pain, now resolved lumbar and cervical spine pain, and possibly hit head.  No neuro deficits.  Eval for hip fx, r/o intracranial or spine injury.  ____________________________________________  FINAL CLINICAL IMPRESSION(S) / ED DIAGNOSES  Final diagnoses:  Contusion of right hip, initial encounter  Strain of lumbar region, initial encounter  Cervical strain, acute, initial encounter    Clinical Course as of Jun 29 1823  Wed Jun 28, 2016  1709 The patient's imaging is reassuring with no intracranial or cervical  spine injury, no lumbar spine abnormalities, no evidence of fracture or dislocation in the right hip. However, the patient has such significant tenderness, I'll follow the right hip x-ray with a CT to rule out fracture.  [AN]    Clinical Course User Index [AN] Eula Listen, MD    ----------------------------------------- 6:25 PM on 06/28/2016 -----------------------------------------  The patient's CT does not show any evidence of fracture. She is able to ambulate with mild discomfort but is able to tolerate full weight bearing. Plan discharge. She understands her follow-up and return  NEW MEDICATIONS STARTED DURING THIS  VISIT:  New Prescriptions   OXYCODONE (ROXICODONE) 5 MG IMMEDIATE RELEASE TABLET    Take 1 tablet (5 mg total) by mouth every 6 (six) hours as needed for severe pain.      Eula Listen, MD 06/28/16 1825

## 2016-06-28 NOTE — ED Triage Notes (Signed)
Per [facility], patient comes from home after a fall. Patient was pushing a cart up a ramp and fell backwards. Patient denies LOC. Patient states she hit the back of her head when she fell. GCS 15. Patient takes daily asa. Patient A&O x4. C/O bilateral hip pain. Left leg shortening noted. Patient states most of her pain is to the right hip. CBG 272. Hx of diabetes. Patient states high blood sugar is abnormal for her but she has been trying to fight a sinus infection.

## 2017-09-21 ENCOUNTER — Other Ambulatory Visit: Payer: Self-pay

## 2017-09-21 ENCOUNTER — Emergency Department: Payer: Medicare HMO

## 2017-09-21 ENCOUNTER — Encounter: Payer: Self-pay | Admitting: Emergency Medicine

## 2017-09-21 ENCOUNTER — Inpatient Hospital Stay
Admission: EM | Admit: 2017-09-21 | Discharge: 2017-09-26 | DRG: 871 | Disposition: A | Discharge status: Home / Self Care | Payer: Medicare HMO | Attending: Internal Medicine | Admitting: Internal Medicine

## 2017-09-21 DIAGNOSIS — Z90710 Acquired absence of both cervix and uterus: Secondary | ICD-10-CM

## 2017-09-21 DIAGNOSIS — T380X5A Adverse effect of glucocorticoids and synthetic analogues, initial encounter: Secondary | ICD-10-CM | POA: Diagnosis present

## 2017-09-21 DIAGNOSIS — Z66 Do not resuscitate: Secondary | ICD-10-CM | POA: Diagnosis present

## 2017-09-21 DIAGNOSIS — J9601 Acute respiratory failure with hypoxia: Secondary | ICD-10-CM | POA: Diagnosis present

## 2017-09-21 DIAGNOSIS — J189 Pneumonia, unspecified organism: Secondary | ICD-10-CM | POA: Diagnosis present

## 2017-09-21 DIAGNOSIS — J441 Chronic obstructive pulmonary disease with (acute) exacerbation: Secondary | ICD-10-CM | POA: Diagnosis present

## 2017-09-21 DIAGNOSIS — A419 Sepsis, unspecified organism: Secondary | ICD-10-CM | POA: Diagnosis not present

## 2017-09-21 DIAGNOSIS — I2510 Atherosclerotic heart disease of native coronary artery without angina pectoris: Secondary | ICD-10-CM | POA: Diagnosis present

## 2017-09-21 DIAGNOSIS — E1165 Type 2 diabetes mellitus with hyperglycemia: Secondary | ICD-10-CM | POA: Diagnosis present

## 2017-09-21 DIAGNOSIS — I252 Old myocardial infarction: Secondary | ICD-10-CM

## 2017-09-21 DIAGNOSIS — I129 Hypertensive chronic kidney disease with stage 1 through stage 4 chronic kidney disease, or unspecified chronic kidney disease: Secondary | ICD-10-CM | POA: Diagnosis present

## 2017-09-21 DIAGNOSIS — E1122 Type 2 diabetes mellitus with diabetic chronic kidney disease: Secondary | ICD-10-CM | POA: Diagnosis present

## 2017-09-21 DIAGNOSIS — N179 Acute kidney failure, unspecified: Secondary | ICD-10-CM | POA: Diagnosis present

## 2017-09-21 DIAGNOSIS — N183 Chronic kidney disease, stage 3 (moderate): Secondary | ICD-10-CM | POA: Diagnosis present

## 2017-09-21 DIAGNOSIS — J440 Chronic obstructive pulmonary disease with acute lower respiratory infection: Secondary | ICD-10-CM | POA: Diagnosis present

## 2017-09-21 DIAGNOSIS — E871 Hypo-osmolality and hyponatremia: Secondary | ICD-10-CM | POA: Diagnosis not present

## 2017-09-21 LAB — PROTIME-INR
INR: 0.98
Prothrombin Time: 12.9 seconds (ref 11.4–15.2)

## 2017-09-21 LAB — COMPREHENSIVE METABOLIC PANEL
ALBUMIN: 3.3 g/dL — AB (ref 3.5–5.0)
ALT: 24 U/L (ref 14–54)
AST: 31 U/L (ref 15–41)
Alkaline Phosphatase: 93 U/L (ref 38–126)
Anion gap: 12 (ref 5–15)
BUN: 33 mg/dL — AB (ref 6–20)
CHLORIDE: 99 mmol/L — AB (ref 101–111)
CO2: 21 mmol/L — AB (ref 22–32)
Calcium: 8.3 mg/dL — ABNORMAL LOW (ref 8.9–10.3)
Creatinine, Ser: 1.81 mg/dL — ABNORMAL HIGH (ref 0.44–1.00)
GFR calc Af Amer: 31 mL/min — ABNORMAL LOW (ref >60)
GFR calc non Af Amer: 27 mL/min — ABNORMAL LOW (ref >60)
GLUCOSE: 261 mg/dL — AB (ref 65–99)
POTASSIUM: 3.8 mmol/L (ref 3.5–5.1)
SODIUM: 132 mmol/L — AB (ref 135–145)
Total Bilirubin: 0.7 mg/dL (ref 0.3–1.2)
Total Protein: 7.6 g/dL (ref 6.5–8.1)

## 2017-09-21 LAB — CBC WITH DIFFERENTIAL/PLATELET
Basophils Absolute: 0 10*3/uL (ref 0–0.1)
Basophils Relative: 0 %
Eosinophils Absolute: 0 10*3/uL (ref 0–0.7)
Eosinophils Relative: 0 %
HEMATOCRIT: 34.5 % — AB (ref 35.0–47.0)
Hemoglobin: 11.3 g/dL — ABNORMAL LOW (ref 12.0–16.0)
LYMPHS ABS: 0.9 10*3/uL — AB (ref 1.0–3.6)
LYMPHS PCT: 8 %
MCH: 28.5 pg (ref 26.0–34.0)
MCHC: 32.6 g/dL (ref 32.0–36.0)
MCV: 87.3 fL (ref 80.0–100.0)
MONO ABS: 0.9 10*3/uL (ref 0.2–0.9)
Monocytes Relative: 8 %
NEUTROS ABS: 9.2 10*3/uL — AB (ref 1.4–6.5)
Neutrophils Relative %: 84 %
Platelets: 197 10*3/uL (ref 150–440)
RBC: 3.95 MIL/uL (ref 3.80–5.20)
RDW: 14.4 % (ref 11.5–14.5)
WBC: 11 10*3/uL (ref 3.6–11.0)

## 2017-09-21 LAB — LACTIC ACID, PLASMA: LACTIC ACID, VENOUS: 1.2 mmol/L (ref 0.5–1.9)

## 2017-09-21 NOTE — ED Notes (Signed)
Patient transported to X-ray 

## 2017-09-21 NOTE — ED Triage Notes (Signed)
Pt in via [facility] from home; pt reports feeling "sick" since Monday.  Pt with non productive cough and febrile upon arrival.  Per EMS, room air saturation 86%, on 4L nasal cannula upon arrival.

## 2017-09-22 DIAGNOSIS — N179 Acute kidney failure, unspecified: Secondary | ICD-10-CM | POA: Diagnosis present

## 2017-09-22 DIAGNOSIS — J441 Chronic obstructive pulmonary disease with (acute) exacerbation: Secondary | ICD-10-CM | POA: Diagnosis present

## 2017-09-22 DIAGNOSIS — J189 Pneumonia, unspecified organism: Secondary | ICD-10-CM | POA: Diagnosis present

## 2017-09-22 DIAGNOSIS — I2510 Atherosclerotic heart disease of native coronary artery without angina pectoris: Secondary | ICD-10-CM | POA: Diagnosis present

## 2017-09-22 DIAGNOSIS — E871 Hypo-osmolality and hyponatremia: Secondary | ICD-10-CM | POA: Diagnosis not present

## 2017-09-22 DIAGNOSIS — N183 Chronic kidney disease, stage 3 (moderate): Secondary | ICD-10-CM | POA: Diagnosis present

## 2017-09-22 DIAGNOSIS — Z90710 Acquired absence of both cervix and uterus: Secondary | ICD-10-CM | POA: Diagnosis not present

## 2017-09-22 DIAGNOSIS — J9601 Acute respiratory failure with hypoxia: Secondary | ICD-10-CM | POA: Diagnosis present

## 2017-09-22 DIAGNOSIS — I252 Old myocardial infarction: Secondary | ICD-10-CM | POA: Diagnosis not present

## 2017-09-22 DIAGNOSIS — T380X5A Adverse effect of glucocorticoids and synthetic analogues, initial encounter: Secondary | ICD-10-CM | POA: Diagnosis present

## 2017-09-22 DIAGNOSIS — E1122 Type 2 diabetes mellitus with diabetic chronic kidney disease: Secondary | ICD-10-CM | POA: Diagnosis present

## 2017-09-22 DIAGNOSIS — Z66 Do not resuscitate: Secondary | ICD-10-CM | POA: Diagnosis present

## 2017-09-22 DIAGNOSIS — E1165 Type 2 diabetes mellitus with hyperglycemia: Secondary | ICD-10-CM | POA: Diagnosis present

## 2017-09-22 DIAGNOSIS — J440 Chronic obstructive pulmonary disease with acute lower respiratory infection: Secondary | ICD-10-CM | POA: Diagnosis present

## 2017-09-22 DIAGNOSIS — I129 Hypertensive chronic kidney disease with stage 1 through stage 4 chronic kidney disease, or unspecified chronic kidney disease: Secondary | ICD-10-CM | POA: Diagnosis present

## 2017-09-22 DIAGNOSIS — A419 Sepsis, unspecified organism: Secondary | ICD-10-CM | POA: Diagnosis present

## 2017-09-22 LAB — URINALYSIS, COMPLETE (UACMP) WITH MICROSCOPIC
Bilirubin Urine: NEGATIVE
GLUCOSE, UA: 50 mg/dL — AB
HGB URINE DIPSTICK: NEGATIVE
Ketones, ur: NEGATIVE mg/dL
NITRITE: NEGATIVE
PH: 5 (ref 5.0–8.0)
Protein, ur: 100 mg/dL — AB
SPECIFIC GRAVITY, URINE: 1.017 (ref 1.005–1.030)

## 2017-09-22 LAB — BASIC METABOLIC PANEL
Anion gap: 12 (ref 5–15)
BUN: 36 mg/dL — AB (ref 6–20)
CHLORIDE: 99 mmol/L — AB (ref 101–111)
CO2: 21 mmol/L — AB (ref 22–32)
Calcium: 7.9 mg/dL — ABNORMAL LOW (ref 8.9–10.3)
Creatinine, Ser: 1.87 mg/dL — ABNORMAL HIGH (ref 0.44–1.00)
GFR calc Af Amer: 30 mL/min — ABNORMAL LOW (ref >60)
GFR calc non Af Amer: 26 mL/min — ABNORMAL LOW (ref >60)
GLUCOSE: 293 mg/dL — AB (ref 65–99)
POTASSIUM: 3.7 mmol/L (ref 3.5–5.1)
SODIUM: 132 mmol/L — AB (ref 135–145)

## 2017-09-22 LAB — INFLUENZA PANEL BY PCR (TYPE A & B)
Influenza A By PCR: NEGATIVE
Influenza B By PCR: NEGATIVE

## 2017-09-22 LAB — CBC
HEMATOCRIT: 31.7 % — AB (ref 35.0–47.0)
Hemoglobin: 10.5 g/dL — ABNORMAL LOW (ref 12.0–16.0)
MCH: 28.9 pg (ref 26.0–34.0)
MCHC: 33.2 g/dL (ref 32.0–36.0)
MCV: 86.9 fL (ref 80.0–100.0)
Platelets: 160 10*3/uL (ref 150–440)
RBC: 3.64 MIL/uL — ABNORMAL LOW (ref 3.80–5.20)
RDW: 14.4 % (ref 11.5–14.5)
WBC: 9.2 10*3/uL (ref 3.6–11.0)

## 2017-09-22 LAB — HEMOGLOBIN A1C
Hgb A1c MFr Bld: 7.2 % — ABNORMAL HIGH (ref 4.8–5.6)
Mean Plasma Glucose: 159.94 mg/dL

## 2017-09-22 LAB — GLUCOSE, CAPILLARY
GLUCOSE-CAPILLARY: 164 mg/dL — AB (ref 65–99)
GLUCOSE-CAPILLARY: 297 mg/dL — AB (ref 65–99)
GLUCOSE-CAPILLARY: 381 mg/dL — AB (ref 65–99)
Glucose-Capillary: 221 mg/dL — ABNORMAL HIGH (ref 65–99)

## 2017-09-22 LAB — LACTIC ACID, PLASMA: Lactic Acid, Venous: 0.8 mmol/L (ref 0.5–1.9)

## 2017-09-22 MED ORDER — PANTOPRAZOLE SODIUM 40 MG PO TBEC
40.0000 mg | DELAYED_RELEASE_TABLET | Freq: Every day | ORAL | Status: DC
  Administered 2017-09-22 – 2017-09-26 (×5): 40 mg via ORAL
  Filled 2017-09-22 (×5): qty 1

## 2017-09-22 MED ORDER — MENTHOL 3 MG MT LOZG
1.0000 | LOZENGE | OROMUCOSAL | Status: DC | PRN
  Filled 2017-09-22: qty 9

## 2017-09-22 MED ORDER — PREDNISONE 50 MG PO TABS
50.0000 mg | ORAL_TABLET | Freq: Every day | ORAL | Status: DC
  Administered 2017-09-22 – 2017-09-24 (×3): 50 mg via ORAL
  Filled 2017-09-22 (×3): qty 1

## 2017-09-22 MED ORDER — MOMETASONE FURO-FORMOTEROL FUM 100-5 MCG/ACT IN AERO
2.0000 | INHALATION_SPRAY | Freq: Two times a day (BID) | RESPIRATORY_TRACT | Status: DC
  Administered 2017-09-22 – 2017-09-26 (×9): 2 via RESPIRATORY_TRACT
  Filled 2017-09-22: qty 8.8

## 2017-09-22 MED ORDER — INSULIN ASPART 100 UNIT/ML ~~LOC~~ SOLN
0.0000 [IU] | Freq: Three times a day (TID) | SUBCUTANEOUS | Status: DC
  Administered 2017-09-22: 8 [IU] via SUBCUTANEOUS
  Administered 2017-09-22: 5 [IU] via SUBCUTANEOUS
  Administered 2017-09-22: 3 [IU] via SUBCUTANEOUS
  Administered 2017-09-23: 15 [IU] via SUBCUTANEOUS
  Administered 2017-09-23: 11 [IU] via SUBCUTANEOUS
  Administered 2017-09-23: 15 [IU] via SUBCUTANEOUS
  Administered 2017-09-24: 8 [IU] via SUBCUTANEOUS
  Administered 2017-09-24: 15 [IU] via SUBCUTANEOUS
  Administered 2017-09-24 – 2017-09-25 (×2): 8 [IU] via SUBCUTANEOUS
  Administered 2017-09-25 (×2): 5 [IU] via SUBCUTANEOUS
  Administered 2017-09-26: 2 [IU] via SUBCUTANEOUS
  Filled 2017-09-22 (×13): qty 1

## 2017-09-22 MED ORDER — ENOXAPARIN SODIUM 40 MG/0.4ML ~~LOC~~ SOLN: 40.0000 mg | SUBCUTANEOUS | Status: DC

## 2017-09-22 MED ORDER — LEVOFLOXACIN IN D5W 750 MG/150ML IV SOLN: 750.0000 mg | INTRAVENOUS | Status: DC

## 2017-09-22 MED ORDER — AMLODIPINE BESYLATE 5 MG PO TABS
5.0000 mg | ORAL_TABLET | Freq: Every day | ORAL | Status: DC
  Administered 2017-09-23 – 2017-09-26 (×3): 5 mg via ORAL
  Filled 2017-09-22 (×5): qty 1

## 2017-09-22 MED ORDER — ONDANSETRON HCL 4 MG PO TABS: 4.0000 mg | ORAL_TABLET | Freq: Four times a day (QID) | ORAL | Status: DC | PRN

## 2017-09-22 MED ORDER — ENOXAPARIN SODIUM 40 MG/0.4ML ~~LOC~~ SOLN
30.0000 mg | SUBCUTANEOUS | Status: DC
  Administered 2017-09-22 – 2017-09-26 (×5): 30 mg via SUBCUTANEOUS
  Filled 2017-09-22 (×5): qty 0.4

## 2017-09-22 MED ORDER — SODIUM CHLORIDE 0.9 % IV SOLN
500.0000 mg | Freq: Once | INTRAVENOUS | Status: AC
  Administered 2017-09-22: 500 mg via INTRAVENOUS
  Filled 2017-09-22: qty 500

## 2017-09-22 MED ORDER — ACETAMINOPHEN 650 MG RE SUPP: 650.0000 mg | Freq: Four times a day (QID) | RECTAL | Status: DC | PRN

## 2017-09-22 MED ORDER — ONDANSETRON HCL 4 MG/2ML IJ SOLN: 4.0000 mg | Freq: Four times a day (QID) | INTRAMUSCULAR | Status: DC | PRN

## 2017-09-22 MED ORDER — GLIPIZIDE 5 MG PO TABS
5.0000 mg | ORAL_TABLET | Freq: Two times a day (BID) | ORAL | Status: DC
  Administered 2017-09-22 – 2017-09-26 (×9): 5 mg via ORAL
  Filled 2017-09-22 (×11): qty 1

## 2017-09-22 MED ORDER — ACETAMINOPHEN 325 MG PO TABS
650.0000 mg | ORAL_TABLET | Freq: Four times a day (QID) | ORAL | Status: DC | PRN
  Administered 2017-09-24: 650 mg via ORAL
  Filled 2017-09-22: qty 2

## 2017-09-22 MED ORDER — METOPROLOL SUCCINATE ER 25 MG PO TB24
25.0000 mg | ORAL_TABLET | Freq: Every day | ORAL | Status: DC
  Administered 2017-09-22 – 2017-09-26 (×5): 25 mg via ORAL
  Filled 2017-09-22 (×4): qty 1

## 2017-09-22 MED ORDER — POLYETHYLENE GLYCOL 3350 17 G PO PACK: 17.0000 g | PACK | Freq: Every day | ORAL | Status: DC | PRN

## 2017-09-22 MED ORDER — ALBUTEROL SULFATE (2.5 MG/3ML) 0.083% IN NEBU
2.5000 mg | INHALATION_SOLUTION | RESPIRATORY_TRACT | Status: DC | PRN
  Administered 2017-09-22: 2.5 mg via RESPIRATORY_TRACT
  Filled 2017-09-22: qty 3

## 2017-09-22 MED ORDER — ASPIRIN EC 81 MG PO TBEC
81.0000 mg | DELAYED_RELEASE_TABLET | Freq: Every day | ORAL | Status: DC
  Administered 2017-09-22 – 2017-09-26 (×5): 81 mg via ORAL
  Filled 2017-09-22 (×5): qty 1

## 2017-09-22 MED ORDER — INSULIN ASPART 100 UNIT/ML ~~LOC~~ SOLN
0.0000 [IU] | Freq: Every day | SUBCUTANEOUS | Status: DC
  Administered 2017-09-22: 5 [IU] via SUBCUTANEOUS
  Administered 2017-09-23: 2 [IU] via SUBCUTANEOUS
  Administered 2017-09-24: 5 [IU] via SUBCUTANEOUS
  Administered 2017-09-25: 100 [IU] via SUBCUTANEOUS
  Filled 2017-09-22 (×4): qty 1

## 2017-09-22 MED ORDER — ORAL CARE MOUTH RINSE
15.0000 mL | Freq: Two times a day (BID) | OROMUCOSAL | Status: DC
  Administered 2017-09-22 – 2017-09-26 (×7): 15 mL via OROMUCOSAL

## 2017-09-22 MED ORDER — SODIUM CHLORIDE 0.9 % IV SOLN
1.0000 g | Freq: Once | INTRAVENOUS | Status: AC
  Administered 2017-09-22: 1 g via INTRAVENOUS
  Filled 2017-09-22: qty 10

## 2017-09-22 NOTE — ED Provider Notes (Signed)
[facility] Emergency Department Provider Note  ____________________________________________   First MD Initiated Contact with Patient 09/21/17 2304     (approximate)  I have reviewed the triage vital signs and the nursing notes.   HISTORY  Chief Complaint Fever and Shortness of Breath   HPI Andrea [name] is a 72 y.o. female with history of COPD as well as MI was presented to the emergency department with fever as well as dry cough and shortness of breath since this past Monday.  Per EMS she was 86% and placed on 4 L by fire rescue.  Not reporting any pain.   Past Medical History:  Diagnosis Date  . COPD (chronic obstructive pulmonary disease) ([place])   . Diabetes mellitus without complication ([place])   . Heart attack ([place])   . Renal disorder     There are no active problems to display for this patient.   Past Surgical History:  Procedure Laterality Date  . ABDOMINAL HYSTERECTOMY    . CARDIAC SURGERY     stents    Prior to Admission medications   Not on File    Allergies Metformin and related and Sulfur  No family history on file.  Social History Social History   Tobacco Use  . Smoking status: Never Smoker  . Smokeless tobacco: Never Used  Substance Use Topics  . Alcohol use: No  . Drug use: No    Review of Systems  Constitutional: Fever Eyes: No visual changes. ENT: No sore throat. Cardiovascular: Denies chest pain. Respiratory: As above Gastrointestinal: No abdominal pain.  No nausea, no vomiting.  No diarrhea.  No constipation. Genitourinary: Negative for dysuria. Musculoskeletal: Negative for back pain. Skin: Negative for rash. Neurological: Negative for headaches, focal weakness or numbness.   ____________________________________________   PHYSICAL EXAM:  VITAL SIGNS: ED Triage Vitals  Enc Vitals Group     BP 09/21/17 2259 140/67     Pulse Rate 09/21/17 2259 (!) 115     Resp 09/21/17 2259 (!) 29     Temp  09/21/17 2259 (!) 101.3 F (38.5 C)     Temp Source 09/21/17 2259 Oral     SpO2 09/21/17 2256 (!) 86 %     Weight 09/21/17 2301 157 lb (71.2 kg)     Height 09/21/17 2301 5\' 2"  (1.575 m)     Head Circumference --      Peak Flow --      Pain Score 09/21/17 2300 0     Pain Loc --      Pain Edu? --      Excl. in [place]? --     Constitutional: Alert and oriented. Well appearing and in no acute distress. Eyes: Conjunctivae are normal.  Head: Atraumatic. Nose: No congestion/rhinnorhea.  Wearing nasal cannula Mouth/Throat: Mucous membranes are moist.  Neck: No stridor.   Cardiovascular: Normal rate, regular rhythm. Grossly normal heart sounds.  Good peripheral circulation. Respiratory: Slightly labored respirations with speaking in full sentences.  Vesicular sounds to the left middle field.  Normal lung sounds to the right field. Gastrointestinal: Soft and nontender. No distention. No CVA tenderness. Musculoskeletal: No lower extremity tenderness nor edema.  No joint effusions. Neurologic:  Normal speech and language. No gross focal neurologic deficits are appreciated. Skin:  Skin is warm, dry and intact. No rash noted. Psychiatric: Mood and affect are normal. Speech and behavior are normal.  ____________________________________________   LABS (all labs ordered are listed, but only abnormal results are displayed)  Labs Reviewed  COMPREHENSIVE METABOLIC PANEL - Abnormal; Notable for the following components:      Result Value   Sodium 132 (*)    Chloride 99 (*)    CO2 21 (*)    Glucose, Bld 261 (*)    BUN 33 (*)    Creatinine, Ser 1.81 (*)    Calcium 8.3 (*)    Albumin 3.3 (*)    GFR calc non Af Amer 27 (*)    GFR calc Af Amer 31 (*)    All other components within normal limits  CBC WITH DIFFERENTIAL/PLATELET - Abnormal; Notable for the following components:   Hemoglobin 11.3 (*)    HCT 34.5 (*)    Neutro Abs 9.2 (*)    Lymphs Abs 0.9 (*)    All other components within normal  limits  CULTURE, BLOOD (ROUTINE X 2)  CULTURE, BLOOD (ROUTINE X 2)  LACTIC ACID, PLASMA  PROTIME-INR  LACTIC ACID, PLASMA  URINALYSIS, COMPLETE (UACMP) WITH MICROSCOPIC  INFLUENZA PANEL BY PCR (TYPE A & B)   ____________________________________________  EKG  ED ECG REPORT I, Doran Stabler, the attending physician, personally viewed and interpreted this ECG.   Date: 09/22/2017  EKG Time: 2302  Rate: 109  Rhythm: sinus tachycardia  Axis: Normal  Intervals:none  ST&T Change: No ST segment elevation or depression.  No abnormal T wave inversion.  EKG machine reads minimal depression in the lateral leads.  Possibly related to the baseline wandering slightly.  ____________________________________________  RADIOLOGY  Right middle lobe pneumonia.  Possible left-sided lingular consolidation ____________________________________________   PROCEDURES  Procedure(s) performed:   Procedures  Critical Care performed:   ____________________________________________   INITIAL IMPRESSION / ASSESSMENT AND PLAN / ED COURSE  Pertinent labs & imaging results that were available during my care of the patient were reviewed by me and considered in my medical decision making (see chart for details).  Differential includes, but is not limited to, viral syndrome, bronchitis including COPD exacerbation, pneumonia, reactive airway disease including asthma, CHF including exacerbation with or without pulmonary/interstitial edema, pneumothorax, ACS, thoracic trauma, and pulmonary embolism. As part of my medical decision making, I reviewed the following data within the electronic MEDICAL RECORD NUMBER Notes from prior ED visits  ----------------------------------------- 12:31 AM on 09/22/2017 -----------------------------------------  Sepsis alert called.  Patient to be admitted for community acquired pneumonia.  Signed out to Dr. Darvin Neighbours.  Patient to be continued on supplemental oxygen which is  new.  ____________________________________________   FINAL CLINICAL IMPRESSION(S) / ED DIAGNOSES  Multilobar pneumonia.    NEW MEDICATIONS STARTED DURING THIS VISIT:  New Prescriptions   No medications on file     Note:  This document was prepared using Dragon voice recognition software and may include unintentional dictation errors.     Orbie Pyo, MD 09/22/17 [phone]

## 2017-09-22 NOTE — Progress Notes (Signed)
Patients BP was 121/56, MD notified and instructed to hold 1200 BP medication. Will reassess BP at 1700.  Dorna Bloom, SN [facility]

## 2017-09-22 NOTE — Progress Notes (Signed)
Pharmacy Antibiotic Note  Andrea [name] is a 72 y.o. female admitted on 09/21/2017 with pneumonia.  Pharmacy has been consulted for levaquin dosing.  Plan: Patient received ceftriaxone 1g and azithromycin 500 mg IV x 1 in ED  Will start levaquin 750 mg IV q48h per CrCl 20 - 49 ml/min to start on 04/01 @ 2200.  Height: 5\' 2"  (157.5 cm) Weight: 157 lb (71.2 kg) IBW/kg (Calculated) : 50.1  Temp (24hrs), Avg:101.3 F (38.5 C), Min:101.3 F (38.5 C), Max:101.3 F (38.5 C)  Recent Labs  Lab 09/21/17 2309  WBC 11.0  CREATININE 1.81*  LATICACIDVEN 1.2    Estimated Creatinine Clearance: 26.3 mL/min (A) (by C-G formula based on SCr of 1.81 mg/dL (H)).    Allergies  Allergen Reactions  . Metformin And Related Diarrhea  . Sulfur Hives    Thank you for allowing pharmacy to be a part of this patient's care.  Tobie Lords, PharmD, BCPS Clinical Pharmacist 09/22/2017

## 2017-09-22 NOTE — H&P (Signed)
[place] [facility] [place] NAME: Andrea [name]    MR#:  [id number]  DATE OF BIRTH:  April 27, 1946  DATE OF ADMISSION:  09/21/2017  PRIMARY CARE PHYSICIAN: Chester Holstein, MD   REQUESTING/REFERRING PHYSICIAN: Dr. Clearnce Hasten  CHIEF COMPLAINT:   Chief Complaint  Patient presents with  . Fever  . Shortness of Breath    HISTORY OF PRESENT ILLNESS:  Pamlea [name]  is a 72 y.o. female with a known history of COPD, hypertension, diabetes mellitus presents to the emergency room complaining of 4 days of worsening shortness of breath and fever of up to 102.8.  Patient on arrival to emergency room was found to have acute hypoxic respiratory failure and placed on 3 L oxygen.  Temperature 101.4, tachycardia.  Lactic acid normal.  Chest x-ray showing right-sided pneumonia.  Patient is being admitted to the hospitalist service.  PAST MEDICAL HISTORY:   Past Medical History:  Diagnosis Date  . COPD (chronic obstructive pulmonary disease) ([place])   . Diabetes mellitus without complication ([place])   . Heart attack ([place])   . Renal disorder     PAST SURGICAL HISTORY:   Past Surgical History:  Procedure Laterality Date  . ABDOMINAL HYSTERECTOMY    . CARDIAC SURGERY     stents    SOCIAL HISTORY:   Social History   Tobacco Use  . Smoking status: Never Smoker  . Smokeless tobacco: Never Used  Substance Use Topics  . Alcohol use: No    FAMILY HISTORY:  No family history on file.  DRUG ALLERGIES:   Allergies  Allergen Reactions  . Metformin And Related Diarrhea  . Sulfur Hives    REVIEW OF SYSTEMS:   Review of Systems  Constitutional: Positive for malaise/fatigue. Negative for chills, fever and weight loss.  HENT: Negative for hearing loss and nosebleeds.   Eyes: Negative for blurred vision, double vision and pain.  Respiratory: Positive for cough, shortness of breath and wheezing. Negative for hemoptysis and sputum production.    Cardiovascular: Negative for chest pain, palpitations, orthopnea and leg swelling.  Gastrointestinal: Negative for abdominal pain, constipation, diarrhea, nausea and vomiting.  Genitourinary: Negative for dysuria and hematuria.  Musculoskeletal: Negative for back pain, falls and myalgias.  Skin: Negative for rash.  Neurological: Negative for dizziness, tremors, sensory change, speech change, focal weakness, seizures and headaches.  Endo/Heme/Allergies: Does not bruise/bleed easily.  Psychiatric/Behavioral: Negative for depression and memory loss. The patient is not nervous/anxious.     MEDICATIONS AT HOME:   Prior to Admission medications   Not on File     VITAL SIGNS:  Blood pressure 140/67, pulse (!) 115, temperature (!) 101.3 F (38.5 C), temperature source Oral, resp. rate (!) 29, height 5\' 2"  (1.575 m), weight 71.2 kg (157 lb), SpO2 94 %.  PHYSICAL EXAMINATION:  Physical Exam  GENERAL:  72 y.o.-year-old patient lying in the bed with conversational dyspnea EYES: Pupils equal, round, reactive to light and accommodation. No scleral icterus. Extraocular muscles intact.  HEENT: Head atraumatic, normocephalic. Oropharynx and nasopharynx clear. No oropharyngeal erythema, moist oral mucosa  NECK:  Supple, no jugular venous distention. No thyroid enlargement, no tenderness.  LUNGS: Decreased breath sounds bilaterally with rhonchi CARDIOVASCULAR: S1, S2 normal. No murmurs, rubs, or gallops.  ABDOMEN: Soft, nontender, nondistended. Bowel sounds present. No organomegaly or mass.  EXTREMITIES: No pedal edema, cyanosis, or clubbing. + 2 pedal & radial pulses b/l.   NEUROLOGIC: Cranial nerves II through XII are intact. No focal  Motor or sensory deficits appreciated b/l PSYCHIATRIC: The patient is alert and oriented x 3.  Anxious SKIN: No obvious rash, lesion, or ulcer.   LABORATORY PANEL:   CBC Recent Labs  Lab 09/21/17 2309  WBC 11.0  HGB 11.3*  HCT 34.5*  PLT 197    ------------------------------------------------------------------------------------------------------------------  Chemistries  Recent Labs  Lab 09/21/17 2309  NA 132*  K 3.8  CL 99*  CO2 21*  GLUCOSE 261*  BUN 33*  CREATININE 1.81*  CALCIUM 8.3*  AST 31  ALT 24  ALKPHOS 93  BILITOT 0.7   ------------------------------------------------------------------------------------------------------------------  Cardiac Enzymes No results for input(s): TROPONINI in the last 168 hours. ------------------------------------------------------------------------------------------------------------------  RADIOLOGY:  Dg Chest 2 View  Result Date: 09/21/2017 CLINICAL DATA:  No fracture of cough.  Decreased oxygen saturation. EXAM: CHEST - 2 VIEW COMPARISON:  09/14/2009 FINDINGS: Cardiomediastinal silhouette is normal. Mediastinal contours appear intact. Calcific atherosclerotic disease of the aorta. There is no evidence of pleural effusion or pneumothorax. Right middle lobe and lingular peribronchial airspace consolidation. Osseous structures are without acute abnormality. Have postsurgical changes in the right chest wall/right breast IMPRESSION: Right middle lobe and lingular peribronchial airspace consolidation, which may be seen with early pneumonia. The distribution is suggestive of atypical pneumonia. Postsurgical changes of the right chest wall or right breast. Has the patient had radiation to her right breast, which may explain some of the changes in the right middle lobe? Electronically Signed   By: Fidela Salisbury M.D.   On: 09/21/2017 23:33     IMPRESSION AND PLAN:   *Right sided community acquired pneumonia with acute hypoxic respiratory failure and COPD exacerbation - steroids, Antibiotics - Scheduled Nebulizers - Inhalers -Wean O2 as tolerated - Consult pulmonary if no improvement  *Hypertension.  Continue amlodipine.  IV medications added.  *Diabetes mellitus type 2.   Sliding scale insulin.  Glipizide at lower dose.  Would likely remain uncontrolled with hyperglycemia due to steroid use per  *DVT prophylaxis with Lovenox   All the records are reviewed and case discussed with ED provider. Management plans discussed with the patient, family and they are in agreement.  CODE STATUS: DNR  TOTAL TIME TAKING CARE OF THIS PATIENT: 35 minutes.   Leia Alf Tannah Dreyfuss M.D on 09/22/2017 at 12:51 AM  Between 7am to 6pm - Pager - [phone]  After 6pm go to [email or web address] - password [email or web address] [facility]  [facility]  Office  [phone]  CC: Primary care physician; Chester Holstein, MD  Note: This dictation was prepared with Dragon dictation along with smaller phrase technology. Any transcriptional errors that result from this process are unintentional.

## 2017-09-22 NOTE — Progress Notes (Signed)
Patient complained of a sore throat and voice hoarseness/difficulty speaking. Respiratory called. PRN neb treatment given, MD paged, orders placed for lozenges. VS and respiratory status stable. Will continue to monitor.  Dorna Bloom, SN [facility]

## 2017-09-22 NOTE — Progress Notes (Signed)
Advance care planning  Patient wants her sister Andrea [name] to be her healthcare power of attorney.  Discussed with patient regarding COPD, acute pneumonia and acute hypoxic respiratory failure.  We discussed regarding prognosis and treatment plan.  Patient quit smoking 30 years back. Patient is independent at baseline and lives alone.  Discussed regarding CPR, intubation and full ventilatory support.  Patient after asking appropriate questions has requested that we change her CODE STATUS to DO NOT RESUSCITATE and DO NOT INTUBATE.  Orders entered  Time spent 20 minutes

## 2017-09-23 LAB — BLOOD CULTURE ID PANEL (REFLEXED)
Acinetobacter baumannii: NOT DETECTED
CANDIDA GLABRATA: NOT DETECTED
CANDIDA TROPICALIS: NOT DETECTED
Candida albicans: NOT DETECTED
Candida krusei: NOT DETECTED
Candida parapsilosis: NOT DETECTED
ENTEROBACTER CLOACAE COMPLEX: NOT DETECTED
Enterobacteriaceae species: NOT DETECTED
Enterococcus species: NOT DETECTED
Escherichia coli: NOT DETECTED
HAEMOPHILUS INFLUENZAE: NOT DETECTED
Klebsiella oxytoca: NOT DETECTED
Klebsiella pneumoniae: NOT DETECTED
Listeria monocytogenes: NOT DETECTED
NEISSERIA MENINGITIDIS: NOT DETECTED
PROTEUS SPECIES: NOT DETECTED
PSEUDOMONAS AERUGINOSA: NOT DETECTED
SERRATIA MARCESCENS: NOT DETECTED
STAPHYLOCOCCUS AUREUS BCID: NOT DETECTED
STREPTOCOCCUS AGALACTIAE: NOT DETECTED
STREPTOCOCCUS SPECIES: NOT DETECTED
Staphylococcus species: NOT DETECTED
Streptococcus pneumoniae: NOT DETECTED
Streptococcus pyogenes: NOT DETECTED

## 2017-09-23 LAB — GLUCOSE, CAPILLARY
GLUCOSE-CAPILLARY: 385 mg/dL — AB (ref 65–99)
Glucose-Capillary: 317 mg/dL — ABNORMAL HIGH (ref 65–99)
Glucose-Capillary: 411 mg/dL — ABNORMAL HIGH (ref 65–99)
Glucose-Capillary: 222 mg/dL — ABNORMAL HIGH (ref 65–99)

## 2017-09-23 LAB — BASIC METABOLIC PANEL
ANION GAP: 12 (ref 5–15)
BUN: 50 mg/dL — ABNORMAL HIGH (ref 6–20)
CALCIUM: 8.8 mg/dL — AB (ref 8.9–10.3)
CO2: 22 mmol/L (ref 22–32)
CREATININE: 2.09 mg/dL — AB (ref 0.44–1.00)
Chloride: 100 mmol/L — ABNORMAL LOW (ref 101–111)
GFR calc Af Amer: 26 mL/min — ABNORMAL LOW (ref >60)
GFR, EST NON AFRICAN AMERICAN: 23 mL/min — AB (ref >60)
GLUCOSE: 377 mg/dL — AB (ref 65–99)
Potassium: 3.9 mmol/L (ref 3.5–5.1)
Sodium: 134 mmol/L — ABNORMAL LOW (ref 135–145)

## 2017-09-23 MED ORDER — TRAZODONE HCL 50 MG PO TABS: 50.0000 mg | ORAL_TABLET | Freq: Every evening | ORAL | Status: DC | PRN

## 2017-09-23 MED ORDER — LEVOFLOXACIN IN D5W 750 MG/150ML IV SOLN
750.0000 mg | INTRAVENOUS | Status: DC
  Administered 2017-09-23 – 2017-09-25 (×2): 750 mg via INTRAVENOUS
  Filled 2017-09-23 (×2): qty 150

## 2017-09-23 MED ORDER — MELATONIN 5 MG PO TABS
5.0000 mg | ORAL_TABLET | Freq: Every day | ORAL | Status: DC
  Administered 2017-09-23 – 2017-09-25 (×3): 5 mg via ORAL
  Filled 2017-09-23 (×4): qty 1

## 2017-09-23 MED ORDER — SODIUM CHLORIDE 0.9 % IV SOLN
INTRAVENOUS | Status: DC
  Administered 2017-09-23 – 2017-09-24 (×2): via INTRAVENOUS

## 2017-09-23 MED ORDER — INSULIN GLARGINE 100 UNIT/ML ~~LOC~~ SOLN
12.0000 [IU] | Freq: Every day | SUBCUTANEOUS | Status: DC
  Administered 2017-09-23: 12 [IU] via SUBCUTANEOUS
  Filled 2017-09-23 (×2): qty 0.12

## 2017-09-23 MED ORDER — ALBUTEROL SULFATE (2.5 MG/3ML) 0.083% IN NEBU
2.5000 mg | INHALATION_SOLUTION | Freq: Four times a day (QID) | RESPIRATORY_TRACT | Status: DC
  Administered 2017-09-23 – 2017-09-26 (×12): 2.5 mg via RESPIRATORY_TRACT
  Filled 2017-09-23 (×12): qty 3

## 2017-09-23 MED ORDER — INSULIN ASPART 100 UNIT/ML ~~LOC~~ SOLN
4.0000 [IU] | SUBCUTANEOUS | Status: AC
  Administered 2017-09-23: 4 [IU] via SUBCUTANEOUS

## 2017-09-23 NOTE — Progress Notes (Signed)
CBG [phone], Dr Leslye Peer paged.

## 2017-09-23 NOTE — Progress Notes (Signed)
Pharmacy Antibiotic Note  [name] is a 72 y.o. female admitted on 09/21/2017 with pneumonia.  Pharmacy has been consulted for levaquin dosing.  Plan: Patient received ceftriaxone 1g and azithromycin 500 mg IV x 1 in ED  Will start levaquin 750 mg IV q48h per CrCl 20 - 49 ml/min to start on 3/31 (separate from azithro by 24hr)  Height: 5\' 2"  (157.5 cm) Weight: 158 lb 12.8 oz (72 kg) IBW/kg (Calculated) : 50.1  Temp (24hrs), Avg:97.6 F (36.4 C), Min:97.1 F (36.2 C), Max:98 F (36.7 C)  Recent Labs  Lab 09/21/17 2309 09/22/17 0309 09/23/17 0355  WBC 11.0 9.2  --   CREATININE 1.81* 1.87* 2.09*  LATICACIDVEN 1.2 0.8  --     Estimated Creatinine Clearance: 23 mL/min (A) (by C-G formula based on SCr of 2.09 mg/dL (H)).    Allergies  Allergen Reactions  . Metformin And Related Diarrhea  . Sulfur Hives    Thank you for allowing pharmacy to be a part of this patient's care.  Ramond Dial, Pharm.D, BCPS Clinical Pharmacist  09/23/2017

## 2017-09-23 NOTE — Progress Notes (Signed)
PHARMACY - PHYSICIAN COMMUNICATION CRITICAL VALUE ALERT - BLOOD CULTURE IDENTIFICATION (BCID)  Andrea [name] is an 72 y.o. female who presented to [facility] on 09/21/2017 with a chief complaint of PNA  Assessment:  1/4 anaerobic gram + cocci BCID negative (include suspected source if known)  Name of physician (or Provider) Contacted: Dr Leslye Peer   Current antibiotics: levaquin  Changes to prescribed antibiotics recommended:  Recommendations declined by provider due to likely contaminant   Results for orders placed or performed during the hospital encounter of 09/21/17  Blood Culture ID Panel (Reflexed) (Collected: 09/21/2017 11:08 PM)  Result Value Ref Range   Enterococcus species NOT DETECTED NOT DETECTED   Listeria monocytogenes NOT DETECTED NOT DETECTED   Staphylococcus species NOT DETECTED NOT DETECTED   Staphylococcus aureus NOT DETECTED NOT DETECTED   Streptococcus species NOT DETECTED NOT DETECTED   Streptococcus agalactiae NOT DETECTED NOT DETECTED   Streptococcus pneumoniae NOT DETECTED NOT DETECTED   Streptococcus pyogenes NOT DETECTED NOT DETECTED   Acinetobacter baumannii NOT DETECTED NOT DETECTED   Enterobacteriaceae species NOT DETECTED NOT DETECTED   Enterobacter cloacae complex NOT DETECTED NOT DETECTED   Escherichia coli NOT DETECTED NOT DETECTED   Klebsiella oxytoca NOT DETECTED NOT DETECTED   Klebsiella pneumoniae NOT DETECTED NOT DETECTED   Proteus species NOT DETECTED NOT DETECTED   Serratia marcescens NOT DETECTED NOT DETECTED   Haemophilus influenzae NOT DETECTED NOT DETECTED   Neisseria meningitidis NOT DETECTED NOT DETECTED   Pseudomonas aeruginosa NOT DETECTED NOT DETECTED   Candida albicans NOT DETECTED NOT DETECTED   Candida glabrata NOT DETECTED NOT DETECTED   Candida krusei NOT DETECTED NOT DETECTED   Candida parapsilosis NOT DETECTED NOT DETECTED   Candida tropicalis NOT DETECTED NOT DETECTED    [name] 09/23/2017  10:05 AM

## 2017-09-23 NOTE — Progress Notes (Signed)
Patient 91% on room air, ambulated to restroom and o2 sats dropped to 84%. O2 2L placed back on and sats increased to 94%.

## 2017-09-23 NOTE — Progress Notes (Addendum)
Patient ID: Andrea [name], female   DOB: Mar 10, 1946, 72 y.o.   MRN: [id number]  [employer] PROGRESS NOTE  Andrea [name] LPF:[id number] DOB: [birth year] DOA: 09/21/2017 PCP: Chester Holstein, MD  HPI/Subjective: Patient feeling better today than yesterday.  Still short of breath and wheezing and cough.  Objective: Vitals:   09/22/17 2127 09/23/17 0745  BP: (!) 114/58 133/60  Pulse: 70 66  Resp: 20 18  Temp: 97.6 F (36.4 C) (!) 97.1 F (36.2 C)  SpO2: 98% 100%    Filed Weights   09/21/17 2301 09/22/17 0226  Weight: 71.2 kg (157 lb) 72 kg (158 lb 12.8 oz)    ROS: Review of Systems  Constitutional: Negative for chills and fever.  Eyes: Negative for blurred vision.  Respiratory: Positive for cough, shortness of breath and wheezing.   Cardiovascular: Negative for chest pain.  Gastrointestinal: Negative for abdominal pain, constipation, diarrhea, nausea and vomiting.  Genitourinary: Negative for dysuria.  Musculoskeletal: Negative for joint pain.  Neurological: Negative for dizziness and headaches.   Exam: Physical Exam  Constitutional: She is oriented to person, place, and time.  HENT:  Nose: No mucosal edema.  Mouth/Throat: No oropharyngeal exudate or posterior oropharyngeal edema.  Eyes: Pupils are equal, round, and reactive to light. Conjunctivae, EOM and lids are normal.  Neck: No JVD present. Carotid bruit is not present. No edema present. No thyroid mass and no thyromegaly present.  Cardiovascular: S1 normal and S2 normal. Exam reveals no gallop.  No murmur heard. Pulses:      Dorsalis pedis pulses are 2+ on the right side, and 2+ on the left side.  Respiratory: No respiratory distress. She has decreased breath sounds in the right middle field, the right lower field, the left middle field and the left lower field. She has wheezes in the right middle field, the right lower field, the left middle field and the left lower field. She has no rhonchi. She has no  rales.  GI: Soft. Bowel sounds are normal. There is no tenderness.  Musculoskeletal:       Right ankle: She exhibits no swelling.       Left ankle: She exhibits no swelling.  Lymphadenopathy:    She has no cervical adenopathy.  Neurological: She is alert and oriented to person, place, and time. No cranial nerve deficit.  Skin: Skin is warm. No rash noted. Nails show no clubbing.  Psychiatric: She has a normal mood and affect.      Data Reviewed: Basic Metabolic Panel: Recent Labs  Lab 09/21/17 2309 09/22/17 0309 09/23/17 0355  NA 132* 132* 134*  K 3.8 3.7 3.9  CL 99* 99* 100*  CO2 21* 21* 22  GLUCOSE 261* 293* 377*  BUN 33* 36* 50*  CREATININE 1.81* 1.87* 2.09*  CALCIUM 8.3* 7.9* 8.8*   Liver Function Tests: Recent Labs  Lab 09/21/17 2309  AST 31  ALT 24  ALKPHOS 93  BILITOT 0.7  PROT 7.6  ALBUMIN 3.3*   CBC: Recent Labs  Lab 09/21/17 2309 09/22/17 0309  WBC 11.0 9.2  NEUTROABS 9.2*  --   HGB 11.3* 10.5*  HCT 34.5* 31.7*  MCV 87.3 86.9  PLT 197 160    CBG: Recent Labs  Lab 09/22/17 1206 09/22/17 1650 09/22/17 2108 09/23/17 0748 09/23/17 1216  GLUCAP 164* 297* 381* 317* 411*    Recent Results (from the past 240 hour(s))  Culture, blood (Routine x 2)     Status: None (Preliminary result)  Collection Time: 09/21/17 11:08 PM  Result Value Ref Range Status   Specimen Description   Final    BLOOD LEFT ANTECUBITAL Performed at [facility], [address] [place], [place] [address]    Special Requests   Final    BOTTLES DRAWN AEROBIC AND ANAEROBIC Blood Culture adequate volume Performed at [facility], [address] [place], [place] [address]    Culture  Setup Time   Final    GRAM POSITIVE COCCI ANAEROBIC BOTTLE ONLY CRITICAL RESULT CALLED TO, READ BACK BY AND VERIFIED WITH: GARRETT COFFEE ON 09/23/17 AT 0957 QSD Performed at [facility], [place] [address], [place], [place] [address]    Culture GRAM POSITIVE COCCI   Final   Report Status PENDING  Incomplete  Blood Culture ID Panel (Reflexed)     Status: None   Collection Time: 09/21/17 11:08 PM  Result Value Ref Range Status   Enterococcus species NOT DETECTED NOT DETECTED Final   Listeria monocytogenes NOT DETECTED NOT DETECTED Final   Staphylococcus species NOT DETECTED NOT DETECTED Final   Staphylococcus aureus NOT DETECTED NOT DETECTED Final   Streptococcus species NOT DETECTED NOT DETECTED Final   Streptococcus agalactiae NOT DETECTED NOT DETECTED Final   Streptococcus pneumoniae NOT DETECTED NOT DETECTED Final   Streptococcus pyogenes NOT DETECTED NOT DETECTED Final   Acinetobacter baumannii NOT DETECTED NOT DETECTED Final   Enterobacteriaceae species NOT DETECTED NOT DETECTED Final   Enterobacter cloacae complex NOT DETECTED NOT DETECTED Final   Escherichia coli NOT DETECTED NOT DETECTED Final   Klebsiella oxytoca NOT DETECTED NOT DETECTED Final   Klebsiella pneumoniae NOT DETECTED NOT DETECTED Final   Proteus species NOT DETECTED NOT DETECTED Final   Serratia marcescens NOT DETECTED NOT DETECTED Final   Haemophilus influenzae NOT DETECTED NOT DETECTED Final   Neisseria meningitidis NOT DETECTED NOT DETECTED Final   Pseudomonas aeruginosa NOT DETECTED NOT DETECTED Final   Candida albicans NOT DETECTED NOT DETECTED Final   Candida glabrata NOT DETECTED NOT DETECTED Final   Candida krusei NOT DETECTED NOT DETECTED Final   Candida parapsilosis NOT DETECTED NOT DETECTED Final   Candida tropicalis NOT DETECTED NOT DETECTED Final    Comment: Performed at [facility], [address] [place], [place] [address]  Culture, blood (Routine x 2)     Status: None (Preliminary result)   Collection Time: 09/21/17 11:09 PM  Result Value Ref Range Status   Specimen Description BLOOD LEFT ANTECUBITAL  Final   Special Requests   Final    BOTTLES DRAWN AEROBIC AND ANAEROBIC Blood Culture adequate volume   Culture   Final    NO GROWTH 1  DAY Performed at [facility], [address] [place], [place] [address]    Report Status PENDING  Incomplete     Studies: Dg Chest 2 View  Result Date: 09/21/2017 CLINICAL DATA:  No fracture of cough.  Decreased oxygen saturation. EXAM: CHEST - 2 VIEW COMPARISON:  09/14/2009 FINDINGS: Cardiomediastinal silhouette is normal. Mediastinal contours appear intact. Calcific atherosclerotic disease of the aorta. There is no evidence of pleural effusion or pneumothorax. Right middle lobe and lingular peribronchial airspace consolidation. Osseous structures are without acute abnormality. Have postsurgical changes in the right chest wall/right breast IMPRESSION: Right middle lobe and lingular peribronchial airspace consolidation, which may be seen with early pneumonia. The distribution is suggestive of atypical pneumonia. Postsurgical changes of the right chest wall or right breast. Has the patient had radiation to her  right breast, which may explain some of the changes in the right middle lobe? Electronically Signed   By: Fidela Salisbury M.D.   On: 09/21/2017 23:33    Scheduled Meds: . albuterol  2.5 mg Nebulization Q6H  . amLODipine  5 mg Oral Daily  . aspirin EC  81 mg Oral Daily  . enoxaparin (LOVENOX) injection  30 mg Subcutaneous Q24H  . glipiZIDE  5 mg Oral BID AC  . insulin aspart  0-15 Units Subcutaneous TID WC  . insulin aspart  0-5 Units Subcutaneous QHS  . insulin glargine  12 Units Subcutaneous QHS  . mouth rinse  15 mL Mouth Rinse BID  . Melatonin  5 mg Oral QHS  . metoprolol succinate  25 mg Oral Daily  . mometasone-formoterol  2 puff Inhalation BID  . pantoprazole  40 mg Oral Daily  . predniSONE  50 mg Oral Q breakfast   Continuous Infusions: . sodium chloride 50 mL/hr at 09/23/17 2706  . levofloxacin (LEVAQUIN) IV 750 mg (09/23/17 1228)    Assessment/Plan:  1. Acute hypoxic respiratory failure.  Patient initially had a pulse ox of 86%.  Currently on 2 L.  Try  to taper off oxygen and check pulse ox tomorrow morning. 2. Clinical sepsis with community-acquired pneumonia (present on admission) with fever tachycardia.  On Levaquin.  I think positive blood culture will likely be a skin contamination. 3. COPD exacerbation on prednisone and nebulizer treatments 4. Type 2 diabetes mellitus sugars will be high while on steroids.  Place on low-dose Lantus along with sliding scale.  Add on a hemoglobin A1c. 5. History of CAD on aspirin and metoprolol 6. Acute kidney injury on chronic kidney disease stage III.  Gentle IV fluids and recheck BMP tomorrow morning.  Code Status:     Code Status Orders  (From admission, onward)        Start     Ordered   09/22/17 0049  Do not attempt resuscitation (DNR)  Continuous    Question Answer Comment  In the event of cardiac or respiratory ARREST Do not call a "code blue"   In the event of cardiac or respiratory ARREST Do not perform Intubation, CPR, defibrillation or ACLS   In the event of cardiac or respiratory ARREST Use medication by any route, position, wound care, and other measures to relive pain and suffering. May use oxygen, suction and manual treatment of airway obstruction as needed for comfort.      09/22/17 0049    Code Status History    This patient has a current code status but no historical code status.     Disposition Plan: To be determined  Antibiotics:  Levaquin  Time spent: 28 minutes    [place]

## 2017-09-24 LAB — GLUCOSE, CAPILLARY
GLUCOSE-CAPILLARY: 265 mg/dL — AB (ref 65–99)
GLUCOSE-CAPILLARY: 298 mg/dL — AB (ref 65–99)
GLUCOSE-CAPILLARY: 385 mg/dL — AB (ref 65–99)
Glucose-Capillary: 353 mg/dL — ABNORMAL HIGH (ref 65–99)

## 2017-09-24 LAB — BASIC METABOLIC PANEL
ANION GAP: 13 (ref 5–15)
BUN: 56 mg/dL — ABNORMAL HIGH (ref 6–20)
CALCIUM: 8.3 mg/dL — AB (ref 8.9–10.3)
CHLORIDE: 98 mmol/L — AB (ref 101–111)
CO2: 18 mmol/L — AB (ref 22–32)
CREATININE: 2.11 mg/dL — AB (ref 0.44–1.00)
GFR calc non Af Amer: 22 mL/min — ABNORMAL LOW (ref >60)
GFR, EST AFRICAN AMERICAN: 26 mL/min — AB (ref >60)
Glucose, Bld: 283 mg/dL — ABNORMAL HIGH (ref 65–99)
Potassium: 3.6 mmol/L (ref 3.5–5.1)
SODIUM: 129 mmol/L — AB (ref 135–145)

## 2017-09-24 MED ORDER — INSULIN GLARGINE 100 UNIT/ML ~~LOC~~ SOLN
15.0000 [IU] | Freq: Every day | SUBCUTANEOUS | Status: DC
  Administered 2017-09-24 – 2017-09-25 (×2): 15 [IU] via SUBCUTANEOUS
  Filled 2017-09-24 (×3): qty 0.15

## 2017-09-24 MED ORDER — BENZONATATE 100 MG PO CAPS
100.0000 mg | ORAL_CAPSULE | Freq: Three times a day (TID) | ORAL | Status: DC
  Administered 2017-09-24 – 2017-09-26 (×6): 100 mg via ORAL
  Filled 2017-09-24 (×6): qty 1

## 2017-09-24 MED ORDER — PREDNISONE 20 MG PO TABS
40.0000 mg | ORAL_TABLET | Freq: Every day | ORAL | Status: DC
  Administered 2017-09-25: 40 mg via ORAL
  Filled 2017-09-24 (×2): qty 2

## 2017-09-24 MED ORDER — INSULIN ASPART 100 UNIT/ML ~~LOC~~ SOLN
4.0000 [IU] | Freq: Three times a day (TID) | SUBCUTANEOUS | Status: DC
  Administered 2017-09-24 – 2017-09-26 (×5): 4 [IU] via SUBCUTANEOUS
  Filled 2017-09-24 (×5): qty 1

## 2017-09-24 NOTE — Progress Notes (Addendum)
Inpatient Diabetes Program Recommendations  AACE/ADA: New Consensus Statement on Inpatient Glycemic Control (2015)  Target Ranges:  Prepandial:   less than 140 mg/dL      Peak postprandial:   less than 180 mg/dL (1-2 hours)      Critically ill patients:  140 - 180 mg/dL   Results for Andrea [name], Andrea [name] (MRN [id number]) as of 09/24/2017 09:00  Ref. Range 09/23/2017 07:48 09/23/2017 12:16 09/23/2017 16:52 09/23/2017 21:13  Glucose-Capillary Latest Ref Range: 65 - 99 mg/dL 317 (H)  11 units NOVOLOG  411 (H)  19 units NOVOLOG  385 (H)  15 units NOVOLOG  222 (H)  2 units NOVOLOG +  12 units LANTUS   Results for Andrea [name], Andrea [name] (MRN [id number]) as of 09/24/2017 09:00  Ref. Range 09/24/2017 08:05  Glucose-Capillary Latest Ref Range: 65 - 99 mg/dL 265 (H)  8 units NOVOLOG    Results for Andrea [name], Andrea [name] (MRN [id number]) as of 09/24/2017 09:00  Ref. Range 09/21/2017 23:09  Hemoglobin A1C Latest Ref Range: 4.8 - 5.6 % 7.2 (H)    Home DM Meds: Glipizide 10 mg BID       Januvia 50 mg daily  Current Orders: Lantus 12 units QHS       Novolog Moderate Correction Scale/ SSI (0-15 units) TID AC + HS      Glipizide 5 mg BID       Glucose levels well controlled at home on oral DM regimen.  Current A1c= 7.2%.  Note patient getting Prednisone 50 mg daily.  Prednisone likely worsening CBG control in hospital.    MD- Please consider the following in-hospital insulin adjustments if patient to remain on Prednisone:  1. Increase Lantus slightly to 15 units QHS  2. Start Novolog Meal Coverage: Novolog 4 units TID with meals (hold if pt eats <50% of meal)     --Will follow patient during hospitalization--  Wyn Quaker RN, MSN, CDE Diabetes Coordinator Inpatient Glycemic Control Team Team Pager: [phone] (8a-5p)

## 2017-09-24 NOTE — Progress Notes (Signed)
Patient ID: Andrea [name], female   DOB: 04/07/1946, 72 y.o.   MRN: [id number]  [facility] PROGRESS NOTE  Andrea [name] UVO:[id number] DOB: 01-25-1946 DOA: 09/21/2017 PCP: Chester Holstein, MD  HPI/Subjective: Patient feels a little bit better today.  Still with some cough and shortness of breath.  There were able to taper down to 1 L of oxygen.  Objective: Vitals:   09/24/17 1304 09/24/17 1306  BP:    Pulse:    Resp:    Temp:    SpO2: (!) 87% 93%    Filed Weights   09/21/17 2301 09/22/17 0226 09/24/17 0500  Weight: 71.2 kg (157 lb) 72 kg (158 lb 12.8 oz) 72 kg (158 lb 12.8 oz)    ROS: Review of Systems  Constitutional: Negative for chills and fever.  Eyes: Negative for blurred vision.  Respiratory: Positive for cough, shortness of breath and wheezing.   Cardiovascular: Negative for chest pain.  Gastrointestinal: Negative for abdominal pain, constipation, diarrhea, nausea and vomiting.  Genitourinary: Negative for dysuria.  Musculoskeletal: Negative for joint pain.  Neurological: Negative for dizziness and headaches.   Exam: Physical Exam  Constitutional: She is oriented to person, place, and time.  HENT:  Nose: No mucosal edema.  Mouth/Throat: No oropharyngeal exudate or posterior oropharyngeal edema.  Eyes: Pupils are equal, round, and reactive to light. Conjunctivae, EOM and lids are normal.  Neck: No JVD present. Carotid bruit is not present. No edema present. No thyroid mass and no thyromegaly present.  Cardiovascular: S1 normal and S2 normal. Exam reveals no gallop.  No murmur heard. Pulses:      Dorsalis pedis pulses are 2+ on the right side, and 2+ on the left side.  Respiratory: No respiratory distress. She has decreased breath sounds in the right middle field, the right lower field, the left middle field and the left lower field. She has wheezes in the right lower field and the left lower field. She has no rhonchi. She has no rales.  GI: Soft. Bowel  sounds are normal. There is no tenderness.  Musculoskeletal:       Right ankle: She exhibits no swelling.       Left ankle: She exhibits no swelling.  Lymphadenopathy:    She has no cervical adenopathy.  Neurological: She is alert and oriented to person, place, and time. No cranial nerve deficit.  Skin: Skin is warm. No rash noted. Nails show no clubbing.  Psychiatric: She has a normal mood and affect.      Data Reviewed: Basic Metabolic Panel: Recent Labs  Lab 09/21/17 2309 09/22/17 0309 09/23/17 0355 09/24/17 0413  NA 132* 132* 134* 129*  K 3.8 3.7 3.9 3.6  CL 99* 99* 100* 98*  CO2 21* 21* 22 18*  GLUCOSE 261* 293* 377* 283*  BUN 33* 36* 50* 56*  CREATININE 1.81* 1.87* 2.09* 2.11*  CALCIUM 8.3* 7.9* 8.8* 8.3*   Liver Function Tests: Recent Labs  Lab 09/21/17 2309  AST 31  ALT 24  ALKPHOS 93  BILITOT 0.7  PROT 7.6  ALBUMIN 3.3*   CBC: Recent Labs  Lab 09/21/17 2309 09/22/17 0309  WBC 11.0 9.2  NEUTROABS 9.2*  --   HGB 11.3* 10.5*  HCT 34.5* 31.7*  MCV 87.3 86.9  PLT 197 160    CBG: Recent Labs  Lab 09/23/17 1216 09/23/17 1652 09/23/17 2113 09/24/17 0805 09/24/17 1138  GLUCAP 411* 385* 222* 265* 298*    Recent Results (from the past 240 hour(s))  Culture, blood (Routine x 2)     Status: None (Preliminary result)   Collection Time: 09/21/17 11:08 PM  Result Value Ref Range Status   Specimen Description   Final    BLOOD LEFT ANTECUBITAL Performed at [facility], [address] [place], [place] [address]    Special Requests   Final    BOTTLES DRAWN AEROBIC AND ANAEROBIC Blood Culture adequate volume Performed at [facility], [address], [place], [place] [address]    Culture  Setup Time   Final    GRAM POSITIVE COCCI ANAEROBIC BOTTLE ONLY CRITICAL RESULT CALLED TO, READ BACK BY AND VERIFIED WITH: GARRETT COFFEE ON 09/23/17 AT 0957 QSD    Culture   Final    GRAM POSITIVE COCCI IDENTIFICATION TO FOLLOW Performed  at [facility], [place] [address], [place], [place] [address]    Report Status PENDING  Incomplete  Blood Culture ID Panel (Reflexed)     Status: None   Collection Time: 09/21/17 11:08 PM  Result Value Ref Range Status   Enterococcus species NOT DETECTED NOT DETECTED Final   Listeria monocytogenes NOT DETECTED NOT DETECTED Final   Staphylococcus species NOT DETECTED NOT DETECTED Final   Staphylococcus aureus NOT DETECTED NOT DETECTED Final   Streptococcus species NOT DETECTED NOT DETECTED Final   Streptococcus agalactiae NOT DETECTED NOT DETECTED Final   Streptococcus pneumoniae NOT DETECTED NOT DETECTED Final   Streptococcus pyogenes NOT DETECTED NOT DETECTED Final   Acinetobacter baumannii NOT DETECTED NOT DETECTED Final   Enterobacteriaceae species NOT DETECTED NOT DETECTED Final   Enterobacter cloacae complex NOT DETECTED NOT DETECTED Final   Escherichia coli NOT DETECTED NOT DETECTED Final   Klebsiella oxytoca NOT DETECTED NOT DETECTED Final   Klebsiella pneumoniae NOT DETECTED NOT DETECTED Final   Proteus species NOT DETECTED NOT DETECTED Final   Serratia marcescens NOT DETECTED NOT DETECTED Final   Haemophilus influenzae NOT DETECTED NOT DETECTED Final   Neisseria meningitidis NOT DETECTED NOT DETECTED Final   Pseudomonas aeruginosa NOT DETECTED NOT DETECTED Final   Candida albicans NOT DETECTED NOT DETECTED Final   Candida glabrata NOT DETECTED NOT DETECTED Final   Candida krusei NOT DETECTED NOT DETECTED Final   Candida parapsilosis NOT DETECTED NOT DETECTED Final   Candida tropicalis NOT DETECTED NOT DETECTED Final    Comment: Performed at [facility], [address] [place], [place] [address]  Culture, blood (Routine x 2)     Status: None (Preliminary result)   Collection Time: 09/21/17 11:09 PM  Result Value Ref Range Status   Specimen Description BLOOD LEFT ANTECUBITAL  Final   Special Requests   Final    BOTTLES DRAWN AEROBIC AND ANAEROBIC Blood  Culture adequate volume   Culture   Final    NO GROWTH 2 DAYS Performed at [facility], [address] [place], [place] [address]    Report Status PENDING  Incomplete     Scheduled Meds: . albuterol  2.5 mg Nebulization Q6H  . amLODipine  5 mg Oral Daily  . aspirin EC  81 mg Oral Daily  . enoxaparin (LOVENOX) injection  30 mg Subcutaneous Q24H  . glipiZIDE  5 mg Oral BID AC  . insulin aspart  0-15 Units Subcutaneous TID WC  . insulin aspart  0-5 Units Subcutaneous QHS  . insulin aspart  4 Units Subcutaneous TID WC  . insulin glargine  15 Units Subcutaneous QHS  . mouth rinse  15 mL Mouth Rinse  BID  . Melatonin  5 mg Oral QHS  . metoprolol succinate  25 mg Oral Daily  . mometasone-formoterol  2 puff Inhalation BID  . pantoprazole  40 mg Oral Daily  . [START ON 09/25/2017] predniSONE  40 mg Oral Q breakfast   Continuous Infusions: . levofloxacin (LEVAQUIN) IV Stopped (09/23/17 1349)    Assessment/Plan:  1. Acute hypoxic respiratory failure.  Patient initially had a pulse ox of 86%.  Currently on 1 L.  Try to taper off oxygen and check pulse ox tomorrow morning. 2. Clinical sepsis with community-acquired pneumonia (present on admission) with fever tachycardia.  On Levaquin.  Positive  blood culture is a skin contaminant  3. COPD exacerbation on prednisone and nebulizer treatments 4. Type 2 diabetes mellitus sugars will be high while on steroids.  While on steroids increase Lantus to 15 units daily and add NovoLog 4 units prior to meals with sliding scale. 5. History of CAD on aspirin and metoprolol 6. Acute kidney injury on chronic kidney disease stage III.  Patient eating and drinking better.  Will stop IV fluids.  Check a BMP tomorrow 7. Hyponatremia.  Could be worsened with the IV fluids.  Stop IV fluids.  Code Status:     Code Status Orders  (From admission, onward)        Start     Ordered   09/22/17 0049  Do not attempt resuscitation (DNR)  Continuous     Question Answer Comment  In the event of cardiac or respiratory ARREST Do not call a "code blue"   In the event of cardiac or respiratory ARREST Do not perform Intubation, CPR, defibrillation or ACLS   In the event of cardiac or respiratory ARREST Use medication by any route, position, wound care, and other measures to relive pain and suffering. May use oxygen, suction and manual treatment of airway obstruction as needed for comfort.      09/22/17 0049    Code Status History    This patient has a current code status but no historical code status.     Disposition Plan: Hopefully home once off oxygen  Antibiotics:  Levaquin  Time spent: 26 minutes    [place]

## 2017-09-25 LAB — GLUCOSE, CAPILLARY
GLUCOSE-CAPILLARY: 209 mg/dL — AB (ref 65–99)
GLUCOSE-CAPILLARY: 254 mg/dL — AB (ref 65–99)
GLUCOSE-CAPILLARY: 250 mg/dL — AB (ref 65–99)
GLUCOSE-CAPILLARY: 273 mg/dL — AB (ref 65–99)

## 2017-09-25 LAB — BASIC METABOLIC PANEL
Anion gap: 5 (ref 5–15)
BUN: 59 mg/dL — ABNORMAL HIGH (ref 6–20)
CALCIUM: 8.9 mg/dL (ref 8.9–10.3)
CO2: 23 mmol/L (ref 22–32)
Chloride: 110 mmol/L (ref 101–111)
Creatinine, Ser: 1.96 mg/dL — ABNORMAL HIGH (ref 0.44–1.00)
GFR, EST AFRICAN AMERICAN: 28 mL/min — AB (ref >60)
GFR, EST NON AFRICAN AMERICAN: 25 mL/min — AB (ref >60)
Glucose, Bld: 299 mg/dL — ABNORMAL HIGH (ref 65–99)
POTASSIUM: 4.4 mmol/L (ref 3.5–5.1)
Sodium: 138 mmol/L (ref 135–145)

## 2017-09-25 LAB — CULTURE, BLOOD (ROUTINE X 2): SPECIAL REQUESTS: ADEQUATE

## 2017-09-25 MED ORDER — GUAIFENESIN-DM 100-10 MG/5ML PO SYRP
5.0000 mL | ORAL_SOLUTION | ORAL | Status: DC | PRN
  Administered 2017-09-25: 5 mL via ORAL
  Filled 2017-09-25: qty 5

## 2017-09-25 MED ORDER — LEVOFLOXACIN 500 MG PO TABS: 750.0000 mg | ORAL_TABLET | ORAL | Status: DC

## 2017-09-25 MED ORDER — HYDROCOD POLST-CPM POLST ER 10-8 MG/5ML PO SUER: 5.0000 mL | Freq: Every evening | ORAL | Status: DC | PRN

## 2017-09-25 NOTE — Progress Notes (Signed)
Inpatient Diabetes Program Recommendations  AACE/ADA: New Consensus Statement on Inpatient Glycemic Control (2015)  Target Ranges:  Prepandial:   less than 140 mg/dL      Peak postprandial:   less than 180 mg/dL (1-2 hours)      Critically ill patients:  140 - 180 mg/dL   Results for Andrea [name], Andrea [name] (MRN [id number]) as of 09/25/2017 12:49  Ref. Range 09/24/2017 08:05 09/24/2017 11:38 09/24/2017 17:08 09/24/2017 21:10  Glucose-Capillary Latest Ref Range: 65 - 99 mg/dL 265 (H) 298 (H) 385 (H) 353 (H)    Results for Andrea [name], Andrea [name] (MRN [id number]) as of 09/25/2017 12:49  Ref. Range 09/25/2017 07:59 09/25/2017 11:40  Glucose-Capillary Latest Ref Range: 65 - 99 mg/dL 209 (H) 254 (H)    Home DM Meds: Glipizide 10 mg BID                             Januvia 50 mg daily  Current Orders: Lantus 15 units QHS                             Novolog Moderate Correction Scale/ SSI (0-15 units) TID AC + HS      Novolog 4 units TID with meals                            Glipizide 5 mg BID       Glucose levels well controlled at home on oral DM regimen.  Current A1c= 7.2%.  Note patient getting Prednisone 40 mg daily.  Prednisone likely worsening CBG control in hospital.  Note that Lantus increased last PM and Novolog Meal Coverage started yesterday at 5pm.     MD- Please consider the following in-hospital insulin adjustments:  Increase Novolog Meal Coverage to: Novolog 6 units TID with meals (hold if pt eats <50% of meal)  While patient remains on Prednisone      --Will follow patient during hospitalization--  Wyn Quaker RN, MSN, CDE Diabetes Coordinator Inpatient Glycemic Control Team Team Pager: [phone] (8a-5p)

## 2017-09-25 NOTE — Evaluation (Signed)
Physical Therapy Evaluation Patient Details Name: Andrea [name] MRN: [id number] DOB: 1945-11-06 Today's Date: 09/25/2017   History of Present Illness  72 y/o female here with pneumonia, sepsis, COPD exacerbation.  Clinical Impression  Pt was able to ambulate 250 ft (and negotiate up/down steps) w/o AD or O2 with safe but relatively slow gait.  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  She is not at her baseline, but regarding all safety and PT issues she does not require further PT intervention.  Pt is safe to go home once O2 situation is resolved and does not require further PT intervention.  She did have drop in O2 on room air with sats in the mid 90s at rest and slow drop to mid 80s during activity - able to improve this with some focused breathing but generally was <90% t/o the effort of ambulation.  Orders will be completed at this time.    Follow Up Recommendations No PT follow up    Equipment Recommendations       Recommendations for Other Services       Precautions / Restrictions Restrictions Weight Bearing Restrictions: No      Mobility  Bed Mobility Overal bed mobility: Independent                Transfers Overall transfer level: Independent               General transfer comment: Pt able to easily rise to standing w/o assist  Ambulation/Gait Ambulation/Gait assistance: Modified independent (Device/Increase time) Ambulation Distance (Feet): 250 Feet Assistive device: None       General Gait Details: Pt reports she is walking much slower than her normal and did chose to occasionally use rail/counter for some stability though she did not have any LOBs or safety issues apart from gradually dropping O2 sats (drop to 85% on room air, generally held 88-90% with focused breathing)  Stairs Stairs: Yes Stairs assistance: Modified independent (Device/Increase time) Stair Management: Two rails Number of Stairs:  4 General stair comments: Pt easily negotiates up/down steps  Wheelchair Mobility    Modified Rankin (Stroke Patients Only)       Balance Overall balance assessment: Modified Independent                                           Pertinent Vitals/Pain Pain Assessment: No/denies pain    Home Living Family/patient expects to be discharged to:: Private residence Living Arrangements: Alone Available Help at Discharge: Family Type of Home: Apartment Home Access: Stairs to enter Entrance Stairs-Rails: Can reach both Entrance Stairs-Number of Steps: 4   Home Equipment: Dell - 2 wheels;Cane - single point      Prior Function Level of Independence: Independent         Comments: Pt active goes to gym 3 hrs 3x/wk, cleans homes, generally very active     Hand Dominance        Extremity/Trunk Assessment   Upper Extremity Assessment Upper Extremity Assessment: Overall WFL for tasks assessed    Lower Extremity Assessment Lower Extremity Assessment: Overall WFL for tasks assessed       Communication   Communication: No difficulties  Cognition Arousal/Alertness: Awake/alert Behavior During Therapy: WFL for tasks assessed/performed Overall Cognitive Status: Within Functional Limits for tasks assessed  General Comments      Exercises     Assessment/Plan    PT Assessment Patent does not need any further PT services  PT Problem List Decreased activity tolerance;Cardiopulmonary status limiting activity;Decreased balance;Decreased safety awareness       PT Treatment Interventions      PT Goals (Current goals can be found in the Care Plan section)  Acute Rehab PT Goals Patient Stated Goal: get back to going to the gym PT Goal Formulation: All assessment and education complete, DC therapy    Frequency     Barriers to discharge        Co-evaluation               AM-PAC PT "6  Clicks" Daily Activity  Outcome Measure Difficulty turning over in bed (including adjusting bedclothes, sheets and blankets)?: None Difficulty moving from lying on back to sitting on the side of the bed? : None Difficulty sitting down on and standing up from a chair with arms (e.g., wheelchair, bedside commode, etc,.)?: None Help needed moving to and from a bed to chair (including a wheelchair)?: None Help needed walking in hospital room?: None Help needed climbing 3-5 steps with a railing? : None 6 Click Score: 24    End of Session Equipment Utilized During Treatment: Gait belt Activity Tolerance: Patient tolerated treatment well;Patient limited by fatigue Patient left: in chair;with call bell/phone within reach Nurse Communication: (drop in O2 on room air) PT Visit Diagnosis: Muscle weakness (generalized) (M62.81);Difficulty in walking, not elsewhere classified (R26.2)    Time: 2440-1027 PT Time Calculation (min) (ACUTE ONLY): 22 min   Charges:   PT Evaluation $PT Eval Low Complexity: 1 Low     PT G Codes:        Kreg Shropshire, DPT 09/25/2017, 10:44 AM

## 2017-09-25 NOTE — Progress Notes (Signed)
Pt on room air sats 93%. Up to bathroom, sats came down to 90%.

## 2017-09-25 NOTE — Progress Notes (Signed)
Patient ID: Andrea [name], female   DOB: [birth year], 72 y.o.   MRN: [id number]  [facility] PROGRESS NOTE  Andrea [name] [id number]:[id number] DOB: Apr 07, 1946 DOA: 09/21/2017 PCP: Chester Holstein, MD  HPI/Subjective: Patient states she was coughing all night and could not sleep.  She is short of breath and wheezing.  Not feeling well at all.  Patient was still on oxygen this morning.    Objective: Vitals:   09/25/17 0950 09/25/17 1023  BP: 133/87   Pulse: 90   Resp:    Temp:    SpO2:  92%    Filed Weights   09/22/17 0226 09/24/17 0500 09/25/17 0447  Weight: 72 kg (158 lb 12.8 oz) 72 kg (158 lb 12.8 oz) 72.8 kg (160 lb 7.9 oz)    ROS: Review of Systems  Constitutional: Negative for chills and fever.  Eyes: Negative for blurred vision.  Respiratory: Positive for cough, shortness of breath and wheezing.   Cardiovascular: Negative for chest pain.  Gastrointestinal: Negative for abdominal pain, constipation, diarrhea, nausea and vomiting.  Genitourinary: Negative for dysuria.  Musculoskeletal: Negative for joint pain.  Neurological: Negative for dizziness and headaches.   Exam: Physical Exam  Constitutional: She is oriented to person, place, and time.  HENT:  Nose: No mucosal edema.  Mouth/Throat: No oropharyngeal exudate or posterior oropharyngeal edema.  Eyes: Pupils are equal, round, and reactive to light. Conjunctivae, EOM and lids are normal.  Neck: No JVD present. Carotid bruit is not present. No edema present. No thyroid mass and no thyromegaly present.  Cardiovascular: S1 normal and S2 normal. Exam reveals no gallop.  No murmur heard. Pulses:      Dorsalis pedis pulses are 2+ on the right side, and 2+ on the left side.  Respiratory: No respiratory distress. She has decreased breath sounds in the right middle field, the right lower field, the left middle field and the left lower field. She has wheezes in the right lower field and the left lower field. She has no  rhonchi. She has no rales.  GI: Soft. Bowel sounds are normal. There is no tenderness.  Musculoskeletal:       Right ankle: She exhibits no swelling.       Left ankle: She exhibits no swelling.  Lymphadenopathy:    She has no cervical adenopathy.  Neurological: She is alert and oriented to person, place, and time. No cranial nerve deficit.  Skin: Skin is warm. No rash noted. Nails show no clubbing.  Psychiatric: She has a normal mood and affect.      Data Reviewed: Basic Metabolic Panel: Recent Labs  Lab 09/21/17 2309 09/22/17 0309 09/23/17 0355 09/24/17 0413 09/25/17 0451  NA 132* 132* 134* 129* 138  K 3.8 3.7 3.9 3.6 4.4  CL 99* 99* 100* 98* 110  CO2 21* 21* 22 18* 23  GLUCOSE 261* 293* 377* 283* 299*  BUN 33* 36* 50* 56* 59*  CREATININE 1.81* 1.87* 2.09* 2.11* 1.96*  CALCIUM 8.3* 7.9* 8.8* 8.3* 8.9   Liver Function Tests: Recent Labs  Lab 09/21/17 2309  AST 31  ALT 24  ALKPHOS 93  BILITOT 0.7  PROT 7.6  ALBUMIN 3.3*   CBC: Recent Labs  Lab 09/21/17 2309 09/22/17 0309  WBC 11.0 9.2  NEUTROABS 9.2*  --   HGB 11.3* 10.5*  HCT 34.5* 31.7*  MCV 87.3 86.9  PLT 197 160    CBG: Recent Labs  Lab 09/24/17 1138 09/24/17 1708 09/24/17 2110 09/25/17 0759 09/25/17  Andrea [name]    Recent Results (from the past 240 hour(s))  Culture, blood (Routine x 2)     Status: Abnormal   Collection Time: 09/21/17 11:08 PM  Result Value Ref Range Status   Specimen Description   Final    BLOOD LEFT ANTECUBITAL Performed at [facility], [address] [place], [place] [address]    Special Requests   Final    BOTTLES DRAWN AEROBIC AND ANAEROBIC Blood Culture adequate volume Performed at [facility], [address] [place], [place] [address]    Culture  Setup Time   Final    GRAM POSITIVE COCCI ANAEROBIC BOTTLE ONLY CRITICAL RESULT CALLED TO, READ BACK BY AND VERIFIED WITH: GARRETT COFFEE ON 09/23/17 AT 0957 QSD     Culture (A)  Final    STAPHYLOCOCCUS SPECIES (COAGULASE NEGATIVE) THE SIGNIFICANCE OF ISOLATING THIS ORGANISM FROM A SINGLE SET OF BLOOD CULTURES WHEN MULTIPLE SETS ARE DRAWN IS UNCERTAIN. PLEASE NOTIFY THE MICROBIOLOGY DEPARTMENT WITHIN ONE WEEK IF SPECIATION AND SENSITIVITIES ARE REQUIRED. Performed at [facility], [place] [address], [place], [place] [address]    Report Status 09/25/2017 FINAL  Final  Blood Culture ID Panel (Reflexed)     Status: None   Collection Time: 09/21/17 11:08 PM  Result Value Ref Range Status   Enterococcus species NOT DETECTED NOT DETECTED Final   Listeria monocytogenes NOT DETECTED NOT DETECTED Final   Staphylococcus species NOT DETECTED NOT DETECTED Final   Staphylococcus aureus NOT DETECTED NOT DETECTED Final   Streptococcus species NOT DETECTED NOT DETECTED Final   Streptococcus agalactiae NOT DETECTED NOT DETECTED Final   Streptococcus pneumoniae NOT DETECTED NOT DETECTED Final   Streptococcus pyogenes NOT DETECTED NOT DETECTED Final   Acinetobacter baumannii NOT DETECTED NOT DETECTED Final   Enterobacteriaceae species NOT DETECTED NOT DETECTED Final   Enterobacter cloacae complex NOT DETECTED NOT DETECTED Final   Escherichia coli NOT DETECTED NOT DETECTED Final   Klebsiella oxytoca NOT DETECTED NOT DETECTED Final   Klebsiella pneumoniae NOT DETECTED NOT DETECTED Final   Proteus species NOT DETECTED NOT DETECTED Final   Serratia marcescens NOT DETECTED NOT DETECTED Final   Haemophilus influenzae NOT DETECTED NOT DETECTED Final   Neisseria meningitidis NOT DETECTED NOT DETECTED Final   Pseudomonas aeruginosa NOT DETECTED NOT DETECTED Final   Candida albicans NOT DETECTED NOT DETECTED Final   Candida glabrata NOT DETECTED NOT DETECTED Final   Candida krusei NOT DETECTED NOT DETECTED Final   Candida parapsilosis NOT DETECTED NOT DETECTED Final   Candida tropicalis NOT DETECTED NOT DETECTED Final    Comment: Performed at [facility], [address] [place], [place] [address]  Culture, blood (Routine x 2)     Status: None (Preliminary result)   Collection Time: 09/21/17 11:09 PM  Result Value Ref Range Status   Specimen Description BLOOD LEFT ANTECUBITAL  Final   Special Requests   Final    BOTTLES DRAWN AEROBIC AND ANAEROBIC Blood Culture adequate volume   Culture   Final    NO GROWTH 3 DAYS Performed at [facility], [address] [place],  [address]    Report Status PENDING  Incomplete     Scheduled Meds: . albuterol  2.5 mg Nebulization Q6H  . amLODipine  5 mg Oral Daily  . aspirin EC  81 mg Oral Daily  . benzonatate  100 mg Oral TID  . enoxaparin (LOVENOX) injection  30 mg  Subcutaneous Q24H  . glipiZIDE  5 mg Oral BID AC  . insulin aspart  0-15 Units Subcutaneous TID WC  . insulin aspart  0-5 Units Subcutaneous QHS  . insulin aspart  4 Units Subcutaneous TID WC  . insulin glargine  15 Units Subcutaneous QHS  . [START ON 09/27/2017] levofloxacin  750 mg Oral Q48H  . mouth rinse  15 mL Mouth Rinse BID  . Melatonin  5 mg Oral QHS  . metoprolol succinate  25 mg Oral Daily  . mometasone-formoterol  2 puff Inhalation BID  . pantoprazole  40 mg Oral Daily  . predniSONE  40 mg Oral Q breakfast   Continuous Infusions:   Assessment/Plan:  1. Acute hypoxic respiratory failure.  Patient initially had a pulse ox of 86%.  Patient able to be titrated off oxygen 2. Clinical sepsis with community-acquired pneumonia (present on admission) with fever tachycardia.  On Levaquin.  Positive  blood culture is a skin contaminant  3. COPD exacerbation on prednisone and nebulizer treatments.  Still with quite a bit of cough at night.  Try Tussionex at night. 4. Type 2 diabetes mellitus sugars will be high while on steroids.  While on steroids increase Lantus to 15 units daily and add NovoLog 4 units prior to meals with sliding scale. 5. History of CAD on aspirin and metoprolol 6. Acute kidney injury on chronic  kidney disease stage III.  Patient eating and drinking better.  Creatinine had improved 7. Hyponatremia.  Improved off IV fluids  Code Status:     Code Status Orders  (From admission, onward)        Start     Ordered   09/22/17 0049  Do not attempt resuscitation (DNR)  Continuous    Question Answer Comment  In the event of cardiac or respiratory ARREST Do not call a "code blue"   In the event of cardiac or respiratory ARREST Do not perform Intubation, CPR, defibrillation or ACLS   In the event of cardiac or respiratory ARREST Use medication by any route, position, wound care, and other measures to relive pain and suffering. May use oxygen, suction and manual treatment of airway obstruction as needed for comfort.      09/22/17 0049    Code Status History    This patient has a current code status but no historical code status.     Disposition Plan: Hopefully will be able to get a better night sleep and less coughing and be able to go home tomorrow.  Antibiotics:  Levaquin  Time spent: 26 minutes  [place]

## 2017-09-25 NOTE — Progress Notes (Signed)
SATURATION QUALIFICATIONS:   Patient Saturations on Room Air at Rest = 93%  Patient Saturations on Room Air while Ambulating = 87%  Patient Saturations on 2 Liters of oxygen while Ambulating = 94%  Please briefly explain why patient needs home oxygen: Desats when ambulating

## 2017-09-26 LAB — GLUCOSE, CAPILLARY
GLUCOSE-CAPILLARY: 132 mg/dL — AB (ref 65–99)
Glucose-Capillary: 129 mg/dL — ABNORMAL HIGH (ref 65–99)

## 2017-09-26 LAB — BASIC METABOLIC PANEL WITH GFR
Anion gap: 9 (ref 5–15)
BUN: 50 mg/dL — ABNORMAL HIGH (ref 6–20)
CO2: 24 mmol/L (ref 22–32)
Calcium: 9.2 mg/dL (ref 8.9–10.3)
Chloride: 109 mmol/L (ref 101–111)
Creatinine, Ser: 1.63 mg/dL — ABNORMAL HIGH (ref 0.44–1.00)
GFR calc Af Amer: 36 mL/min — ABNORMAL LOW
GFR calc non Af Amer: 31 mL/min — ABNORMAL LOW
Glucose, Bld: 198 mg/dL — ABNORMAL HIGH (ref 65–99)
Potassium: 4 mmol/L (ref 3.5–5.1)
Sodium: 142 mmol/L (ref 135–145)

## 2017-09-26 MED ORDER — LEVOFLOXACIN 750 MG PO TABS: 750.0000 mg | ORAL_TABLET | ORAL | 0 refills | Status: DC

## 2017-09-26 MED ORDER — TRAZODONE HCL 50 MG PO TABS: 50.0000 mg | ORAL_TABLET | Freq: Every evening | ORAL | 0 refills | Status: DC | PRN

## 2017-09-26 MED ORDER — HYDROCOD POLST-CPM POLST ER 10-8 MG/5ML PO SUER: 5.0000 mL | Freq: Every evening | ORAL | 0 refills | Status: DC | PRN

## 2017-09-26 MED ORDER — PREDNISONE 10 MG PO TABS: ORAL_TABLET | ORAL | 0 refills | Status: DC

## 2017-09-26 MED ORDER — PREDNISONE 20 MG PO TABS
30.0000 mg | ORAL_TABLET | Freq: Every day | ORAL | Status: DC
  Administered 2017-09-26: 30 mg via ORAL

## 2017-09-26 MED ORDER — BENZONATATE 100 MG PO CAPS: 100.0000 mg | ORAL_CAPSULE | Freq: Three times a day (TID) | ORAL | 0 refills | Status: DC

## 2017-09-26 NOTE — Discharge Instructions (Signed)

## 2017-09-26 NOTE — Discharge Summary (Signed)
[name] NAME: Andrea [name]    MR#:  [id number]  DATE OF BIRTH:  [birth year]  DATE OF ADMISSION:  09/21/2017 ADMITTING PHYSICIAN: Hillary Bow, MD  DATE OF DISCHARGE: 09/26/2017  1:10 PM  PRIMARY CARE PHYSICIAN: Chester Holstein, MD    ADMISSION DIAGNOSIS:  Multifocal pneumonia [J18.9]  DISCHARGE DIAGNOSIS:  Active Problems:   Pneumonia   SECONDARY DIAGNOSIS:   Past Medical History:  Diagnosis Date  . COPD (chronic obstructive pulmonary disease) ([place])   . Diabetes mellitus without complication ([place])   . Heart attack ([place])   . Renal disorder     HOSPITAL COURSE:   1.  Acute hypoxic respiratory failure.  The patient initially had a pulse ox of 86%.  The patient was able to be titrated off oxygen prior to disposition. 2.  Clinical sepsis with community-acquired pneumonia.  Present on admission.  Patient had fever and tachycardia.  The patient was started on Levaquin.  Positive blood culture was a skin contamination.  2 more doses of Levaquin upon going home. 3.  COPD exacerbation.  The patient was started on prednisone and nebulizer treatments.  The patient had quite a bit of cough and was prescribed Tussionex at night which helped. 4.  Type 2 diabetes mellitus.  Sugars were high while on steroids.  Her A1c is 7.2 under good control at home.  She will go back on glipizide at home.  Here in the hospital she was given Lantus and NovoLog insulin to cover while she was on the steroids.  Quick prednisone taper. 5.  History of CAD on aspirin and metoprolol 6.  Acute kidney injury on chronic kidney disease stage III.  Since patient is eating and drinking better her creatinine actually improved down to  1.63.  Was high as 2.11 during the hospital course. 7.  Hyponatremia.  When the patient was started on IV fluids for acute kidney injury the sodium actually went down.  Her IV fluids were stopped and sodium then normalized.  DISCHARGE  CONDITIONS:   Satisfactory  CONSULTS OBTAINED:  None  DRUG ALLERGIES:   Allergies  Allergen Reactions  . Metformin And Related Diarrhea  . Sulfur Hives    DISCHARGE MEDICATIONS:   Allergies as of 09/26/2017      Reactions   Metformin And Related Diarrhea   Sulfur Hives      Medication List    TAKE these medications   amLODipine 10 MG tablet Commonly known as:  NORVASC TAKE 1 TABLET BY MOUTH ONCE DAILY FOR  HIGH  BLOOD  PRESSURE   aspirin EC 81 MG tablet Take 81 mg by mouth daily.   benzonatate 100 MG capsule Commonly known as:  TESSALON Take 1 capsule (100 mg total) by mouth 3 (three) times daily.   chlorpheniramine-HYDROcodone 10-8 MG/5ML Suer Commonly known as:  TUSSIONEX Take 5 mLs by mouth at bedtime as needed for cough.   fluticasone 50 MCG/ACT nasal spray Commonly known as:  FLONASE 1 spray by Each Nare route daily.   glipiZIDE 10 MG tablet Commonly known as:  GLUCOTROL TAKE ONE TABLET BY MOUTH TWICE DAILY TAKE  30  MINUTES  BEFORE  A  MEAL*   levofloxacin 750 MG tablet Commonly known as:  LEVAQUIN Take 1 tablet (750 mg total) by mouth every other day. Start taking on:  09/27/2017   metoprolol succinate 25 MG 24 hr tablet Commonly known as:  TOPROL-XL TAKE ONE TABLET BY MOUTH ONCE  DAILY   MULTI-VITAMINS Tabs Take 1 tablet by mouth daily.   nitroGLYCERIN 0.4 MG SL tablet Commonly known as:  NITROSTAT PLACE 1 TABLET UNDER THE TONGUE EVERY 5 MINUTES AS NEEDED FOR CHEST PAIN   omeprazole 20 MG capsule Commonly known as:  PRILOSEC Take 1 capsule by mouth daily.   predniSONE 10 MG tablet Commonly known as:  DELTASONE 2 tabs po day1; 1 tab po day2 then stop   sitaGLIPtin 50 MG tablet Commonly known as:  JANUVIA Take 50 mg by mouth.   SPIRIVA HANDIHALER 18 MCG inhalation capsule Generic drug:  tiotropium INHALE ONE DOSE BY MOUTH ONCE DAILY   SYMBICORT 160-4.5 MCG/ACT inhaler Generic drug:  budesonide-formoterol INHALE TWO PUFFS BY MOUTH  TWICE DAILY   traZODone 50 MG tablet Commonly known as:  DESYREL Take 1 tablet (50 mg total) by mouth at bedtime as needed for sleep.   VENTOLIN HFA 108 (90 Base) MCG/ACT inhaler Generic drug:  albuterol INHALE TWO PUFFS BY MOUTH EVERY 4 HOURS AS NEEDED FOR WHEEZING   Vitamin D3 1000 units Caps Take 1 capsule by mouth daily.        DISCHARGE INSTRUCTIONS:  Follow-up PMD  6 days   If you experience worsening of your admission symptoms, develop shortness of breath, life threatening emergency, suicidal or homicidal thoughts you must seek medical attention immediately by calling 911 or calling your MD immediately  if symptoms less severe.  You Must read complete instructions/literature along with all the possible adverse reactions/side effects for all the Medicines you take and that have been prescribed to you. Take any new Medicines after you have completely understood and accept all the possible adverse reactions/side effects.   Please note  You were cared for by a hospitalist during your hospital stay. If you have any questions about your discharge medications or the care you received while you were in the hospital after you are discharged, you can call the unit and asked to speak with the hospitalist on call if the hospitalist that took care of you is not available. Once you are discharged, your primary care physician will handle any further medical issues. Please note that NO REFILLS for any discharge medications will be authorized once you are discharged, as it is imperative that you return to your primary care physician (or establish a relationship with a primary care physician if you do not have one) for your aftercare needs so that they can reassess your need for medications and monitor your lab values.    Today   CHIEF COMPLAINT:   Chief Complaint  Patient presents with  . Fever  . Shortness of Breath    HISTORY OF PRESENT ILLNESS:  Andrea [name]  is a 72 y.o. female  with a known history of presented with fever and shortness of breath   VITAL SIGNS:  Blood pressure (!) 135/59, pulse 69, temperature 97.7 F (36.5 C), temperature source Oral, resp. rate 18, height 5\' 2"  (1.575 m), weight 73 kg (161 lb), SpO2 96 %.   PHYSICAL EXAMINATION:  GENERAL:  72 y.o.-year-old patient lying in the bed with no acute distress.  EYES: Pupils equal, round, reactive to light and accommodation. No scleral icterus. Extraocular muscles intact.  HEENT: Head atraumatic, normocephalic. Oropharynx and nasopharynx clear.  NECK:  Supple, no jugular venous distention. No thyroid enlargement, no tenderness.  LUNGS: Decreased breath sounds bilaterally, no wheezing, rales,rhonchi or crepitation. No use of accessory muscles of respiration.  CARDIOVASCULAR: S1, S2 normal. No murmurs, rubs,  or gallops.  ABDOMEN: Soft, non-tender, non-distended. Bowel sounds present. No organomegaly or mass.  EXTREMITIES: No pedal edema, cyanosis, or clubbing.  NEUROLOGIC: Cranial nerves II through XII are intact. Muscle strength 5/5 in all extremities. Sensation intact. Gait not checked.  PSYCHIATRIC: The patient is alert and oriented x 3.  SKIN: No obvious rash, lesion, or ulcer.   DATA REVIEW:   CBC Recent Labs  Lab 09/22/17 0309  WBC 9.2  HGB 10.5*  HCT 31.7*  PLT 160    Chemistries  Recent Labs  Lab 09/21/17 2309  09/26/17 0544  NA 132*   < > 142  K 3.8   < > 4.0  CL 99*   < > 109  CO2 21*   < > 24  GLUCOSE 261*   < > 198*  BUN 33*   < > 50*  CREATININE 1.81*   < > 1.63*  CALCIUM 8.3*   < > 9.2  AST 31  --   --   ALT 24  --   --   ALKPHOS 93  --   --   BILITOT 0.7  --   --    < > = values in this interval not displayed.     Microbiology Results  Results for orders placed or performed during the hospital encounter of 09/21/17  Culture, blood (Routine x 2)     Status: Abnormal   Collection Time: 09/21/17 11:08 PM  Result Value Ref Range Status   Specimen Description    Final    BLOOD LEFT ANTECUBITAL Performed at [facility], [address], [place], [place] [address]    Special Requests   Final    BOTTLES DRAWN AEROBIC AND ANAEROBIC Blood Culture adequate volume Performed at [facility], [address] [place], [place] [address]    Culture  Setup Time   Final    GRAM POSITIVE COCCI ANAEROBIC BOTTLE ONLY CRITICAL RESULT CALLED TO, READ BACK BY AND VERIFIED WITH: GARRETT COFFEE ON 09/23/17 AT 0957 QSD    Culture (A)  Final    STAPHYLOCOCCUS SPECIES (COAGULASE NEGATIVE) THE SIGNIFICANCE OF ISOLATING THIS ORGANISM FROM A SINGLE SET OF BLOOD CULTURES WHEN MULTIPLE SETS ARE DRAWN IS UNCERTAIN. PLEASE NOTIFY THE MICROBIOLOGY DEPARTMENT WITHIN ONE WEEK IF SPECIATION AND SENSITIVITIES ARE REQUIRED. Performed at [facility], [place] [address] [place], [place] [address]    Report Status 09/25/2017 FINAL  Final  Blood Culture ID Panel (Reflexed)     Status: None   Collection Time: 09/21/17 11:08 PM  Result Value Ref Range Status   Enterococcus species NOT DETECTED NOT DETECTED Final   Listeria monocytogenes NOT DETECTED NOT DETECTED Final   Staphylococcus species NOT DETECTED NOT DETECTED Final   Staphylococcus aureus NOT DETECTED NOT DETECTED Final   Streptococcus species NOT DETECTED NOT DETECTED Final   Streptococcus agalactiae NOT DETECTED NOT DETECTED Final   Streptococcus pneumoniae NOT DETECTED NOT DETECTED Final   Streptococcus pyogenes NOT DETECTED NOT DETECTED Final   Acinetobacter baumannii NOT DETECTED NOT DETECTED Final   Enterobacteriaceae species NOT DETECTED NOT DETECTED Final   Enterobacter cloacae complex NOT DETECTED NOT DETECTED Final   Escherichia coli NOT DETECTED NOT DETECTED Final   Klebsiella oxytoca NOT DETECTED NOT DETECTED Final   Klebsiella pneumoniae NOT DETECTED NOT DETECTED Final   Proteus species NOT DETECTED NOT DETECTED Final   Serratia marcescens NOT DETECTED NOT DETECTED Final   Haemophilus  influenzae NOT DETECTED NOT DETECTED Final  Neisseria meningitidis NOT DETECTED NOT DETECTED Final   Pseudomonas aeruginosa NOT DETECTED NOT DETECTED Final   Candida albicans NOT DETECTED NOT DETECTED Final   Candida glabrata NOT DETECTED NOT DETECTED Final   Candida krusei NOT DETECTED NOT DETECTED Final   Candida parapsilosis NOT DETECTED NOT DETECTED Final   Candida tropicalis NOT DETECTED NOT DETECTED Final    Comment: Performed at [facility], [address] [place], [place] [address]  Culture, blood (Routine x 2)     Status: None (Preliminary result)   Collection Time: 09/21/17 11:09 PM  Result Value Ref Range Status   Specimen Description BLOOD LEFT ANTECUBITAL  Final   Special Requests   Final    BOTTLES DRAWN AEROBIC AND ANAEROBIC Blood Culture adequate volume   Culture   Final    NO GROWTH 4 DAYS Performed at [facility], [address], [place], [place] [address]    Report Status PENDING  Incomplete     Management plans discussed with the patient,  and she is in agreement.  CODE STATUS:  Code Status History    Date Active Date Inactive Code Status Order ID Comments User Context   09/22/2017 0049 09/26/2017 1618 DNR [id number]  Hillary Bow, MD ED    Questions for Most Recent Historical Code Status (Order [id number])    Question Answer Comment   In the event of cardiac or respiratory ARREST Do not call a "code blue"    In the event of cardiac or respiratory ARREST Do not perform Intubation, CPR, defibrillation or ACLS    In the event of cardiac or respiratory ARREST Use medication by any route, position, wound care, and other measures to relive pain and suffering. May use oxygen, suction and manual treatment of airway obstruction as needed for comfort.       TOTAL TIME TAKING CARE OF THIS PATIENT: 32 minutes.    Loletha Grayer M.D on 09/26/2017 at 4:36 PM  Between 7am to 6pm - Pager - [phone]  After 6pm go to [email or web address] - password  [facility]  [phone]  CC: Primary care physician; Chester Holstein, MD

## 2017-09-26 NOTE — Progress Notes (Signed)
Patient being discharged. Daughter here to pick her up. Reviewed AVS in detail with patient. Reiterated medication education that was discussed in the morning. Patient given opportunity to ask questions.

## 2017-09-27 LAB — CULTURE, BLOOD (ROUTINE X 2)
CULTURE: NO GROWTH
Special Requests: ADEQUATE

## 2017-10-01 ENCOUNTER — Telehealth: Payer: Self-pay

## 2017-10-01 NOTE — Telephone Encounter (Signed)
EMMI Follow-up: Noted on the report the patient had questions about discharge papers.  She did receive her discharge paperwork but questioned why MD took her off cholesterol medication while in the hospital and why it wasn't on the list of medications to resume. She said she had lost about 1 lb. a day (158.8 down to 153.2 lbs.)  Directed her to contact Dr. Verner Chol regarding if she should/or should not resume taking cholesterol medication and weight lost. No other concerns at this time.

## 2017-11-15 ENCOUNTER — Other Ambulatory Visit: Payer: Self-pay

## 2017-11-15 ENCOUNTER — Inpatient Hospital Stay
Admission: EM | Admit: 2017-11-15 | Discharge: 2017-11-18 | DRG: 190 | Disposition: A | Discharge status: Home / Self Care | Payer: Medicare HMO | Attending: Internal Medicine | Admitting: Internal Medicine

## 2017-11-15 ENCOUNTER — Encounter: Payer: Self-pay | Admitting: Emergency Medicine

## 2017-11-15 ENCOUNTER — Emergency Department: Payer: Medicare HMO

## 2017-11-15 DIAGNOSIS — K219 Gastro-esophageal reflux disease without esophagitis: Secondary | ICD-10-CM | POA: Diagnosis present

## 2017-11-15 DIAGNOSIS — E1122 Type 2 diabetes mellitus with diabetic chronic kidney disease: Secondary | ICD-10-CM | POA: Diagnosis present

## 2017-11-15 DIAGNOSIS — B348 Other viral infections of unspecified site: Secondary | ICD-10-CM | POA: Diagnosis present

## 2017-11-15 DIAGNOSIS — N183 Chronic kidney disease, stage 3 (moderate): Secondary | ICD-10-CM | POA: Diagnosis present

## 2017-11-15 DIAGNOSIS — J9601 Acute respiratory failure with hypoxia: Secondary | ICD-10-CM | POA: Diagnosis present

## 2017-11-15 DIAGNOSIS — Z90710 Acquired absence of both cervix and uterus: Secondary | ICD-10-CM

## 2017-11-15 DIAGNOSIS — Z79899 Other long term (current) drug therapy: Secondary | ICD-10-CM | POA: Diagnosis not present

## 2017-11-15 DIAGNOSIS — Z882 Allergy status to sulfonamides status: Secondary | ICD-10-CM | POA: Diagnosis not present

## 2017-11-15 DIAGNOSIS — Z7984 Long term (current) use of oral hypoglycemic drugs: Secondary | ICD-10-CM | POA: Diagnosis not present

## 2017-11-15 DIAGNOSIS — I2510 Atherosclerotic heart disease of native coronary artery without angina pectoris: Secondary | ICD-10-CM | POA: Diagnosis present

## 2017-11-15 DIAGNOSIS — Z7982 Long term (current) use of aspirin: Secondary | ICD-10-CM

## 2017-11-15 DIAGNOSIS — J441 Chronic obstructive pulmonary disease with (acute) exacerbation: Secondary | ICD-10-CM | POA: Diagnosis present

## 2017-11-15 DIAGNOSIS — Z7951 Long term (current) use of inhaled steroids: Secondary | ICD-10-CM

## 2017-11-15 DIAGNOSIS — J440 Chronic obstructive pulmonary disease with acute lower respiratory infection: Principal | ICD-10-CM | POA: Diagnosis present

## 2017-11-15 DIAGNOSIS — I252 Old myocardial infarction: Secondary | ICD-10-CM | POA: Diagnosis not present

## 2017-11-15 DIAGNOSIS — R0902 Hypoxemia: Secondary | ICD-10-CM

## 2017-11-15 DIAGNOSIS — Z888 Allergy status to other drugs, medicaments and biological substances status: Secondary | ICD-10-CM | POA: Diagnosis not present

## 2017-11-15 DIAGNOSIS — I129 Hypertensive chronic kidney disease with stage 1 through stage 4 chronic kidney disease, or unspecified chronic kidney disease: Secondary | ICD-10-CM | POA: Diagnosis present

## 2017-11-15 DIAGNOSIS — J209 Acute bronchitis, unspecified: Secondary | ICD-10-CM | POA: Diagnosis present

## 2017-11-15 LAB — BASIC METABOLIC PANEL
Anion gap: 8 (ref 5–15)
BUN: 21 mg/dL — ABNORMAL HIGH (ref 6–20)
CHLORIDE: 102 mmol/L (ref 101–111)
CO2: 26 mmol/L (ref 22–32)
Calcium: 9 mg/dL (ref 8.9–10.3)
Creatinine, Ser: 1.55 mg/dL — ABNORMAL HIGH (ref 0.44–1.00)
GFR, EST AFRICAN AMERICAN: 38 mL/min — AB (ref >60)
GFR, EST NON AFRICAN AMERICAN: 33 mL/min — AB (ref >60)
Glucose, Bld: 211 mg/dL — ABNORMAL HIGH (ref 65–99)
POTASSIUM: 4.2 mmol/L (ref 3.5–5.1)
Sodium: 136 mmol/L (ref 135–145)

## 2017-11-15 LAB — TROPONIN I: Troponin I: <0.03 ng/mL (ref <0.03)

## 2017-11-15 LAB — CBC WITH DIFFERENTIAL/PLATELET
BASOS ABS: 0 10*3/uL (ref 0–0.1)
BASOS PCT: 0 %
EOS PCT: 3 %
Eosinophils Absolute: 0.2 10*3/uL (ref 0–0.7)
HCT: 35.6 % (ref 35.0–47.0)
HEMOGLOBIN: 11.9 g/dL — AB (ref 12.0–16.0)
LYMPHS PCT: 19 %
Lymphs Abs: 1.2 10*3/uL (ref 1.0–3.6)
MCH: 29.4 pg (ref 26.0–34.0)
MCHC: 33.6 g/dL (ref 32.0–36.0)
MCV: 87.5 fL (ref 80.0–100.0)
Monocytes Absolute: 0.6 10*3/uL (ref 0.2–0.9)
Monocytes Relative: 8 %
NEUTROS ABS: 4.6 10*3/uL (ref 1.4–6.5)
Neutrophils Relative %: 70 %
PLATELETS: 171 10*3/uL (ref 150–440)
RBC: 4.06 MIL/uL (ref 3.80–5.20)
RDW: 15.5 % — ABNORMAL HIGH (ref 11.5–14.5)
WBC: 6.7 10*3/uL (ref 3.6–11.0)

## 2017-11-15 LAB — INFLUENZA PANEL BY PCR (TYPE A & B)
INFLAPCR: NEGATIVE
Influenza B By PCR: NEGATIVE

## 2017-11-15 LAB — GLUCOSE, CAPILLARY: GLUCOSE-CAPILLARY: 406 mg/dL — AB (ref 65–99)

## 2017-11-15 MED ORDER — IPRATROPIUM BROMIDE 0.02 % IN SOLN
1.0000 mg | Freq: Once | RESPIRATORY_TRACT | Status: AC
  Administered 2017-11-15: 1 mg via RESPIRATORY_TRACT
  Filled 2017-11-15: qty 5

## 2017-11-15 MED ORDER — METHYLPREDNISOLONE SODIUM SUCC 125 MG IJ SOLR
125.0000 mg | Freq: Once | INTRAMUSCULAR | Status: AC
  Administered 2017-11-15: 125 mg via INTRAVENOUS
  Filled 2017-11-15: qty 2

## 2017-11-15 MED ORDER — IPRATROPIUM-ALBUTEROL 0.5-2.5 (3) MG/3ML IN SOLN
3.0000 mL | Freq: Four times a day (QID) | RESPIRATORY_TRACT | Status: DC
  Administered 2017-11-15 – 2017-11-18 (×12): 3 mL via RESPIRATORY_TRACT
  Filled 2017-11-15 (×12): qty 3

## 2017-11-15 MED ORDER — INSULIN ASPART 100 UNIT/ML ~~LOC~~ SOLN: 0.0000 [IU] | Freq: Every day | SUBCUTANEOUS | Status: DC

## 2017-11-15 MED ORDER — AZITHROMYCIN 500 MG PO TABS
500.0000 mg | ORAL_TABLET | Freq: Once | ORAL | Status: AC
  Administered 2017-11-15: 500 mg via ORAL
  Filled 2017-11-15: qty 1

## 2017-11-15 MED ORDER — IPRATROPIUM-ALBUTEROL 0.5-2.5 (3) MG/3ML IN SOLN
3.0000 mL | Freq: Once | RESPIRATORY_TRACT | Status: AC
  Administered 2017-11-15: 3 mL via RESPIRATORY_TRACT
  Filled 2017-11-15: qty 3

## 2017-11-15 MED ORDER — AZITHROMYCIN 250 MG PO TABS
250.0000 mg | ORAL_TABLET | Freq: Every day | ORAL | Status: DC
  Administered 2017-11-16 – 2017-11-18 (×3): 250 mg via ORAL
  Filled 2017-11-15 (×3): qty 1

## 2017-11-15 MED ORDER — INSULIN ASPART 100 UNIT/ML ~~LOC~~ SOLN
0.0000 [IU] | Freq: Three times a day (TID) | SUBCUTANEOUS | Status: DC
  Administered 2017-11-15: 15 [IU] via SUBCUTANEOUS
  Filled 2017-11-15: qty 1

## 2017-11-15 MED ORDER — INSULIN ASPART 100 UNIT/ML ~~LOC~~ SOLN
0.0000 [IU] | Freq: Three times a day (TID) | SUBCUTANEOUS | Status: DC
  Administered 2017-11-16 (×2): 15 [IU] via SUBCUTANEOUS
  Administered 2017-11-16: 8 [IU] via SUBCUTANEOUS
  Administered 2017-11-17: 2 [IU] via SUBCUTANEOUS
  Administered 2017-11-17 (×2): 8 [IU] via SUBCUTANEOUS
  Administered 2017-11-18: 3 [IU] via SUBCUTANEOUS
  Administered 2017-11-18: 8 [IU] via SUBCUTANEOUS
  Filled 2017-11-15 (×9): qty 1

## 2017-11-15 MED ORDER — ALBUTEROL SULFATE HFA 108 (90 BASE) MCG/ACT IN AERS: 2.0000 | INHALATION_SPRAY | RESPIRATORY_TRACT | Status: DC | PRN

## 2017-11-15 MED ORDER — ONDANSETRON HCL 4 MG/2ML IJ SOLN: 4.0000 mg | Freq: Four times a day (QID) | INTRAMUSCULAR | Status: DC | PRN

## 2017-11-15 MED ORDER — DOCUSATE SODIUM 100 MG PO CAPS
100.0000 mg | ORAL_CAPSULE | Freq: Two times a day (BID) | ORAL | Status: DC
  Administered 2017-11-15 – 2017-11-18 (×6): 100 mg via ORAL
  Filled 2017-11-15 (×6): qty 1

## 2017-11-15 MED ORDER — PREDNISONE 20 MG PO TABS: 40.0000 mg | ORAL_TABLET | Freq: Every day | ORAL | Status: DC

## 2017-11-15 MED ORDER — AZITHROMYCIN 250 MG PO TABS
500.0000 mg | ORAL_TABLET | Freq: Once | ORAL | Status: DC
Start: 2017-11-15 — End: 2017-11-15

## 2017-11-15 MED ORDER — INSULIN ASPART 100 UNIT/ML ~~LOC~~ SOLN
0.0000 [IU] | Freq: Every day | SUBCUTANEOUS | Status: DC
  Administered 2017-11-16: 5 [IU] via SUBCUTANEOUS
  Filled 2017-11-15: qty 1

## 2017-11-15 MED ORDER — ALBUTEROL SULFATE (2.5 MG/3ML) 0.083% IN NEBU
2.5000 mg | INHALATION_SOLUTION | RESPIRATORY_TRACT | Status: DC | PRN
  Filled 2017-11-15: qty 3

## 2017-11-15 MED ORDER — INSULIN GLARGINE 100 UNIT/ML ~~LOC~~ SOLN
12.0000 [IU] | Freq: Every day | SUBCUTANEOUS | Status: DC
  Administered 2017-11-15 – 2017-11-17 (×3): 12 [IU] via SUBCUTANEOUS
  Filled 2017-11-15 (×4): qty 0.12

## 2017-11-15 MED ORDER — AZITHROMYCIN 250 MG PO TABS: 250.0000 mg | ORAL_TABLET | Freq: Every day | ORAL | Status: DC

## 2017-11-15 MED ORDER — METHYLPREDNISOLONE SODIUM SUCC 40 MG IJ SOLR
40.0000 mg | Freq: Three times a day (TID) | INTRAMUSCULAR | Status: DC
  Administered 2017-11-15 – 2017-11-16 (×3): 40 mg via INTRAVENOUS
  Filled 2017-11-15 (×3): qty 1

## 2017-11-15 MED ORDER — ALBUTEROL SULFATE (2.5 MG/3ML) 0.083% IN NEBU
5.0000 mg | INHALATION_SOLUTION | Freq: Once | RESPIRATORY_TRACT | Status: AC
  Administered 2017-11-15: 5 mg via RESPIRATORY_TRACT
  Filled 2017-11-15: qty 6

## 2017-11-15 MED ORDER — INSULIN ASPART 100 UNIT/ML ~~LOC~~ SOLN
10.0000 [IU] | Freq: Once | SUBCUTANEOUS | Status: AC
  Administered 2017-11-15: 10 [IU] via SUBCUTANEOUS
  Filled 2017-11-15: qty 1

## 2017-11-15 MED ORDER — ALBUTEROL SULFATE (2.5 MG/3ML) 0.083% IN NEBU
2.5000 mg | INHALATION_SOLUTION | RESPIRATORY_TRACT | Status: AC
  Administered 2017-11-15: 2.5 mg via RESPIRATORY_TRACT
  Filled 2017-11-15: qty 3

## 2017-11-15 MED ORDER — ACETAMINOPHEN 325 MG PO TABS
650.0000 mg | ORAL_TABLET | Freq: Four times a day (QID) | ORAL | Status: DC | PRN
  Administered 2017-11-16 – 2017-11-18 (×2): 650 mg via ORAL
  Filled 2017-11-15 (×2): qty 2

## 2017-11-15 NOTE — Progress Notes (Signed)
Nwew admission for respiratory failure secondary to bronchitis and COPD exercebation,iv steriods in progress,2 liters of oxygen via nasal canula,hyperglycemic and treated.

## 2017-11-15 NOTE — ED Provider Notes (Signed)
[facility] Emergency Department Provider Note   ____________________________________________   First MD Initiated Contact with Patient 11/15/17 1152     (approximate)  I have reviewed the triage vital signs and the nursing notes.   HISTORY  Chief Complaint Shortness of Breath    HPI Andrea [name] is a 72 y.o. female here for shortness of breath, reports about 4 days now she has had a cough generally nonproductive but did have some productive sputum yesterday.  She reports she is noticed some wheezing and a "rattling and bubbling" when she coughs.  This morning she got up and when she woke up she felt very short of breath.  She walked to her chair and checked her home pulse oximetry which registered 80%.  She sat in the chair while her oxygen level improved into the high 80s, but she is continued to feel short of breath.  Does not use oxygen at home.  She has not used any breathing treatments at home, reports she does not have a nebulizer  No chest pain.  No fevers or chills.  No nausea vomiting.  Reports she had pneumonia and was hospitalized just about a month ago as well.     Past Medical History:  Diagnosis Date  . COPD (chronic obstructive pulmonary disease) ([place])   . Diabetes mellitus without complication ([place])   . Heart attack ([place])   . Renal disorder     Patient Active Problem List   Diagnosis Date Noted  . Pneumonia 09/22/2017    Past Surgical History:  Procedure Laterality Date  . ABDOMINAL HYSTERECTOMY    . CARDIAC SURGERY     stents    Prior to Admission medications   Medication Sig Start Date End Date Taking? Authorizing Provider  albuterol (VENTOLIN HFA) 108 (90 Base) MCG/ACT inhaler INHALE TWO PUFFS BY MOUTH EVERY 4 HOURS AS NEEDED FOR WHEEZING 05/07/17   [provider]  amLODipine (NORVASC) 10 MG tablet TAKE 1 TABLET BY MOUTH ONCE DAILY FOR  HIGH  BLOOD  PRESSURE 05/28/17   [provider]  aspirin EC 81  MG tablet Take 81 mg by mouth daily.    [provider]  benzonatate (TESSALON) 100 MG capsule Take 1 capsule (100 mg total) by mouth 3 (three) times daily. 09/26/17   Loletha Grayer, MD  budesonide-formoterol (SYMBICORT) 160-4.5 MCG/ACT inhaler INHALE TWO PUFFS BY MOUTH TWICE DAILY 06/28/16   [provider]  chlorpheniramine-HYDROcodone (TUSSIONEX) 10-8 MG/5ML SUER Take 5 mLs by mouth at bedtime as needed for cough. 09/26/17   Loletha Grayer, MD  Cholecalciferol (VITAMIN D3) 1000 units CAPS Take 1 capsule by mouth daily.    [provider]  fluticasone (FLONASE) 50 MCG/ACT nasal spray 1 spray by Each Nare route daily. 01/03/17 01/03/18  [provider]  glipiZIDE (GLUCOTROL) 10 MG tablet TAKE ONE TABLET BY MOUTH TWICE DAILY TAKE  30  MINUTES  BEFORE  A  MEAL* 03/19/17   [provider]  levofloxacin (LEVAQUIN) 750 MG tablet Take 1 tablet (750 mg total) by mouth every other day. 09/27/17   Loletha Grayer, MD  metoprolol succinate (TOPROL-XL) 25 MG 24 hr tablet TAKE ONE TABLET BY MOUTH ONCE DAILY 11/15/16   [provider]  Multiple Vitamin (MULTI-VITAMINS) TABS Take 1 tablet by mouth daily.    [provider]  nitroGLYCERIN (NITROSTAT) 0.4 MG SL tablet PLACE 1 TABLET UNDER THE TONGUE EVERY 5 MINUTES AS NEEDED FOR CHEST PAIN 01/19/17   [provider]  omeprazole (PRILOSEC) 20 MG capsule Take 1 capsule by mouth daily. 07/04/17   [provider]  predniSONE (DELTASONE) 10 MG tablet 2 tabs po day1; 1 tab po day2 then stop 09/26/17   Loletha Grayer, MD  sitaGLIPtin (JANUVIA) 50 MG tablet Take 50 mg by mouth. 07/31/17 07/31/18  [provider]  tiotropium (SPIRIVA HANDIHALER) 18 MCG inhalation capsule INHALE ONE DOSE BY MOUTH ONCE DAILY 08/01/17   [provider]  traZODone (DESYREL) 50 MG tablet Take 1 tablet (50 mg total) by mouth at bedtime as needed for sleep. 09/26/17   Loletha Grayer, MD    Allergies Metformin  and related and Sulfur  History reviewed. No pertinent family history.  Social History Social History   Tobacco Use  . Smoking status: Never Smoker  . Smokeless tobacco: Never Used  Substance Use Topics  . Alcohol use: No  . Drug use: No    Review of Systems Constitutional: No fever/chills Eyes: No visual changes. ENT: No sore throat. Cardiovascular: Denies chest pain. Respiratory: Denies shortness of breath. Gastrointestinal: No abdominal pain.  No nausea, no vomiting.  No diarrhea.  No constipation. Genitourinary: Negative for dysuria. Musculoskeletal: Negative for back pain. Skin: Negative for rash. Neurological: Negative for headaches, focal weakness or numbness.    ____________________________________________   PHYSICAL EXAM:  VITAL SIGNS: ED Triage Vitals  Enc Vitals Group     BP 11/15/17 1135 (!) 156/57     Pulse Rate 11/15/17 1135 81     Resp --      Temp 11/15/17 1135 98.1 F (36.7 C)     Temp Source 11/15/17 1135 Oral     SpO2 11/15/17 1135 (!) 88 %     Weight 11/15/17 1136 150 lb (68 kg)     Height 11/15/17 1136 5' 2"  (1.575 m)     Head Circumference --      Peak Flow --      Pain Score 11/15/17 1136 0     Pain Loc --      Pain Edu? --      Excl. in [place]? --     Constitutional: Alert and oriented.  Mildly dyspneic, saturating about 87% on room air.  She is very pleasant along with friend at the bedside. Eyes: Conjunctivae are normal. Head: Atraumatic. Nose: No congestion/rhinnorhea. Mouth/Throat: Mucous membranes are moist. Neck: No stridor.   Cardiovascular: Normal rate, regular rhythm. Grossly normal heart sounds.  Good peripheral circulation. Respiratory: Mild use of accessory muscles.  Moderate end expiratory wheezing in all lobes.  Central rhonchi.  No focal crackles denoted. Gastrointestinal: Soft and nontender. No distention. Musculoskeletal: No lower extremity tenderness nor edema. Neurologic:  Normal speech and language. No gross focal  neurologic deficits are appreciated.  Skin:  Skin is warm, dry and intact. No rash noted. Psychiatric: Mood and affect are normal. Speech and behavior are normal.  Patient placed on 2 L nasal cannula with improvement in oxygen saturation.  Reports improvement in shortness of breath.  ____________________________________________   LABS (all labs ordered are listed, but only abnormal results are displayed)  Labs Reviewed  CBC WITH DIFFERENTIAL/PLATELET - Abnormal; Notable for the following components:      Result Value   Hemoglobin 11.9 (*)    RDW 15.5 (*)    All other components within normal limits  BASIC METABOLIC PANEL - Abnormal; Notable for the following components:   Glucose, Bld 211 (*)    BUN 21 (*)    Creatinine, Ser  1.55 (*)    GFR calc non Af Amer 33 (*)    GFR calc Af Amer 38 (*)    All other components within normal limits  TROPONIN I  INFLUENZA PANEL BY PCR (TYPE A & B)   ____________________________________________  EKG  Reviewed enterotomy at 1310 Heart rate 110 QRS 90 QTc 470 Sinus tachycardia, somewhat diffuse T wave depressions noted throughout, except for the anterior precordial leads.  No evidence of STEMI ____________________________________________  RADIOLOGY    Chest x-ray reviewed, no acute disease denoted ____________________________________________   PROCEDURES  Procedure(s) performed: None  Procedures  Critical Care performed: Yes, see critical care note(s)  CRITICAL CARE Performed by: Delman Kitten   Total critical care time: 35 minutes  Critical care time was exclusive of separately billable procedures and treating other patients.  Critical care was necessary to treat or prevent imminent or life-threatening deterioration.  Critical care was time spent personally by me on the following activities: development of treatment plan with patient and/or surrogate as well as nursing, discussions with consultants, evaluation of patient's  response to treatment, examination of patient, obtaining history from patient or surrogate, ordering and performing treatments and interventions, ordering and review of laboratory studies, ordering and review of radiographic studies, pulse oximetry and re-evaluation of patient's condition.  Patient required repeated doses of bronchodilator, IV steroids, and reassessment as well as admission to the hospital.  Patient felt to be at elevated risk for pulmonary decompensation, respiratory failure.  ____________________________________________   INITIAL IMPRESSION / ASSESSMENT AND PLAN / ED COURSE  Pertinent labs & imaging results that were available during my care of the patient were reviewed by me and considered in my medical decision making (see chart for details).  Dyspnea.  Associated with wheezing, upper respiratory symptoms including dry cough.  No associated chest pain.  Recent hospitalization and treated with outpatient Levaquin for multifocal pneumonia with less than 2 months ago.  As of acute cardiac disease by EKG, will send trop, CBC, met panel.  No chest pain, no pleuritic discomfort, no tachycardia suggest pulmonary embolism.  Symptoms appear likely bronchitis with associated COPD exacerbation given presentation, but also consider potential infectious etiologies such as pneumonia.  Will start by treatment with duo nebs and steroids, close reevaluation.    ----------------------------------------- 1:44 PM on 11/15/2017 -----------------------------------------  Patient condition improving, on 2 L nasal cannula saturation in the mid 90s now.  Mild ongoing expiratory wheezing.  Will order additional albuterol treatment for ongoing bronchospasm.  ____________________________________________   FINAL CLINICAL IMPRESSION(S) / ED DIAGNOSES  Final diagnoses:  COPD exacerbation ([place])  Hypoxia      NEW MEDICATIONS STARTED DURING THIS VISIT:  New Prescriptions   No medications  on file     Note:  This document was prepared using Dragon voice recognition software and may include unintentional dictation errors.     Delman Kitten, MD 11/15/17 1346

## 2017-11-15 NOTE — ED Triage Notes (Signed)
Pt brought over from [facility], she was being seen for [facility], they were unable to get her O2 above 90%. Pt reports that she had pneumonia about a month ago and feels that she may not have got over it. She does not wear O2 at home. She is able to speak in complete clear sentences. She reports that she got [facility] when she was getting ready this morning. She does have a non-productive cough.

## 2017-11-15 NOTE — Progress Notes (Signed)
Family Meeting Note  Advance Directive:yes  Today a meeting took place with the Patient.   The following clinical team members were present during this meeting:MD  The following were discussed:Patient's diagnosis: Acute hypoxic respiratory failure from COPD exacerbation and acute bronchitis, other comorbidities as dictated below in the plan of care discussed in detail with the patient.  She verbalized understanding of the plan.      Patient's progosis: > 12 months and Goals for treatment: Full Code, her-2 daughters are healthcare power of attorney  Additional follow-up to be provided: hospitalist  Time spent during discussion:18 min   Nicholes Mango, MD

## 2017-11-15 NOTE — ED Notes (Signed)
Admitting provider at bedside.

## 2017-11-15 NOTE — H&P (Signed)
[facility] [place] NAME: Andrea [name]    MR#:  [id number]  DATE OF BIRTH:  Oct 15, 1945  DATE OF ADMISSION:  11/15/2017  PRIMARY CARE PHYSICIAN: Chester Holstein, MD   REQUESTING/REFERRING PHYSICIAN: quale  CHIEF COMPLAINT:   Sob , cough HISTORY OF PRESENT ILLNESS:  Andrea [name]  is a 72 y.o. female with a known history of copd ,DM, CKD , is presenting to the ED with a chief complaint of worsening of shortness of breath and cough for the past 2 to 3 days.  Patient does not live on oxygen at home.  Influenza test is negative and a chest x-ray is negative in the ED.  Patient is started on IV Solu-Medrol as she was hypoxemic and she was received a bronchodilator treatments.  Hospitalist team is called to admit the patient as there is no significant improvement after bronchodilator treatments and IV Solu-Medrol.  Patient is resting comfortably during my examination.  Patient reports while resting she is  not short of breath.  PAST MEDICAL HISTORY:   Past Medical History:  Diagnosis Date  . COPD (chronic obstructive pulmonary disease) ([place])   . Diabetes mellitus without complication ([place])   . Heart attack ([place])   . Renal disorder     PAST SURGICAL HISTOIRY:   Past Surgical History:  Procedure Laterality Date  . ABDOMINAL HYSTERECTOMY    . CARDIAC SURGERY     stents    SOCIAL HISTORY:   Social History   Tobacco Use  . Smoking status: Never Smoker  . Smokeless tobacco: Never Used  Substance Use Topics  . Alcohol use: No    FAMILY HISTORY:  History reviewed. No pertinent family history.  DRUG ALLERGIES:   Allergies  Allergen Reactions  . Metformin And Related Diarrhea  . Sulfur Hives    REVIEW OF SYSTEMS:  CONSTITUTIONAL: No fever, fatigue or weakness.  EYES: No blurred or double vision.  EARS, NOSE, AND THROAT: No tinnitus or ear pain.  RESPIRATORY: reports cough, shortness of breath, wheezing , no   hemoptysis.  CARDIOVASCULAR: No chest pain, orthopnea, edema.  GASTROINTESTINAL: No nausea, vomiting, diarrhea or abdominal pain.  GENITOURINARY: No dysuria, hematuria.  ENDOCRINE: No polyuria, nocturia,  HEMATOLOGY: No anemia, easy bruising or bleeding SKIN: No rash or lesion. MUSCULOSKELETAL: No joint pain or arthritis.   NEUROLOGIC: No tingling, numbness, weakness.  PSYCHIATRY: No anxiety or depression.   MEDICATIONS AT HOME:   Prior to Admission medications   Medication Sig Start Date End Date Taking? Authorizing Provider  albuterol (VENTOLIN HFA) 108 (90 Base) MCG/ACT inhaler INHALE TWO PUFFS BY MOUTH EVERY 4 HOURS AS NEEDED FOR WHEEZING 05/07/17  Yes [provider]  amLODipine (NORVASC) 10 MG tablet TAKE 1 TABLET BY MOUTH ONCE DAILY FOR  HIGH  BLOOD  PRESSURE 05/28/17  Yes [provider]  aspirin EC 81 MG tablet Take 81 mg by mouth daily.   Yes [provider]  atorvastatin (LIPITOR) 40 MG tablet Take 40 mg by mouth at bedtime. 10/09/17  Yes [provider]  benzonatate (TESSALON) 100 MG capsule Take 1 capsule (100 mg total) by mouth 3 (three) times daily. 09/26/17  Yes Loletha Grayer, MD  budesonide-formoterol (SYMBICORT) 160-4.5 MCG/ACT inhaler INHALE TWO PUFFS BY MOUTH TWICE DAILY 06/28/16  Yes [provider]  chlorpheniramine-HYDROcodone (TUSSIONEX) 10-8 MG/5ML SUER Take 5 mLs by mouth at bedtime as needed for cough. 09/26/17  Yes Loletha Grayer, MD  Cholecalciferol (VITAMIN D3)  1000 units CAPS Take 1 capsule by mouth daily.   Yes [provider]  fluticasone (FLONASE) 50 MCG/ACT nasal spray 1 spray by Each Nare route daily. 01/03/17 01/03/18 Yes [provider]  glipiZIDE (GLUCOTROL) 10 MG tablet TAKE ONE TABLET BY MOUTH TWICE DAILY TAKE  30  MINUTES  BEFORE  A  MEAL* 03/19/17  Yes [provider]  metoprolol succinate (TOPROL-XL) 25 MG 24 hr tablet TAKE ONE TABLET BY MOUTH ONCE DAILY 11/15/16  Yes [provider]  Multiple Vitamin (MULTI-VITAMINS) TABS Take 1 tablet by mouth daily.   Yes [provider]  nitroGLYCERIN (NITROSTAT) 0.4 MG SL tablet PLACE 1 TABLET UNDER THE TONGUE EVERY 5 MINUTES AS NEEDED FOR CHEST PAIN 01/19/17  Yes [provider]  omeprazole (PRILOSEC) 20 MG capsule Take 1 capsule by mouth daily. 07/04/17  Yes [provider]  sitaGLIPtin (JANUVIA) 50 MG tablet Take 50 mg by mouth. 07/31/17 07/31/18 Yes [provider]  tiotropium (SPIRIVA HANDIHALER) 18 MCG inhalation capsule INHALE ONE DOSE BY MOUTH ONCE DAILY 08/01/17  Yes [provider]  predniSONE (DELTASONE) 10 MG tablet 2 tabs po day1; 1 tab po day2 then stop Patient not taking: Reported on 11/15/2017 09/26/17   Loletha Grayer, MD  traZODone (DESYREL) 50 MG tablet Take 1 tablet (50 mg total) by mouth at bedtime as needed for sleep. Patient not taking: Reported on 11/15/2017 09/26/17   Loletha Grayer, MD      VITAL SIGNS:  Blood pressure (!) 131/53, pulse 98, temperature 98.1 F (36.7 C), temperature source Oral, resp. rate 16, height 5\' 2"  (1.575 m), weight 68 kg (150 lb), SpO2 96 %.  PHYSICAL EXAMINATION:  GENERAL:  72 y.o.-year-old patient lying in the bed with no acute distress.  EYES: Pupils equal, round, reactive to light and accommodation. No scleral icterus. Extraocular muscles intact.  HEENT: Head atraumatic, normocephalic. Oropharynx and nasopharynx clear.  NECK:  Supple, no jugular venous distention. No thyroid enlargement, no tenderness.  LUNGS: Mod coarse  breath sounds bilaterally, min  Wheezing, no rales,rhonchi or crepitation. No use of accessory muscles of respiration.  CARDIOVASCULAR: S1, S2 normal. No murmurs, rubs, or gallops.  ABDOMEN: Soft, nontender, nondistended. Bowel sounds present. No organomegaly or mass.  EXTREMITIES: No pedal edema, cyanosis, or clubbing.  NEUROLOGIC: Cranial nerves II through XII are intact. Muscle strength 5/5 in all  extremities. Sensation intact. Gait not checked.  PSYCHIATRIC: The patient is alert and oriented x 3.  SKIN: No obvious rash, lesion, or ulcer.   LABORATORY PANEL:   CBC Recent Labs  Lab 11/15/17 1147  WBC 6.7  HGB 11.9*  HCT 35.6  PLT 171   ------------------------------------------------------------------------------------------------------------------  Chemistries  Recent Labs  Lab 11/15/17 1147  NA 136  K 4.2  CL 102  CO2 26  GLUCOSE 211*  BUN 21*  CREATININE 1.55*  CALCIUM 9.0   ------------------------------------------------------------------------------------------------------------------  Cardiac Enzymes Recent Labs  Lab 11/15/17 1147  TROPONINI <0.03   ------------------------------------------------------------------------------------------------------------------  RADIOLOGY:  Dg Chest 2 View  Result Date: 11/15/2017 CLINICAL DATA:  Shortness of breath. EXAM: CHEST - 2 VIEW COMPARISON:  09/21/2017 FINDINGS: There is bilateral chronic interstitial thickening. There is no focal consolidation. There is no pleural effusion or pneumothorax. The heart and mediastinal contours are unremarkable. The osseous structures are unremarkable. IMPRESSION: No active cardiopulmonary disease. Electronically Signed   By: Kathreen Devoid   On: 11/15/2017 12:18    EKG:   Orders placed or performed during the hospital encounter of  11/15/17  . ED EKG  . ED EKG  . EKG 12-Lead  . EKG 12-Lead    IMPRESSION AND PLAN:   Sury Wentworth  is a 72 y.o. female with a known history of copd ,DM, CKD , is presenting to the ED with a chief complaint of worsening of shortness of breath and cough for the past 2 to 3 days.  Patient does not live on oxygen at home.  Influenza test is negative and a chest x-ray is negative in the ED.  Patient is started on IV Solu-Medrol as she was hypoxemic and she was received a bronchodilator treatments  #Acute hypoxic respiratory failure secondary to  acute bronchitis and COPD exacerbation Admit to MedSurg unit  oxygen via nasal cannula and wean off as tolerated  Solu-Medrol IV and bronchodilator treatments  #Acute bronchitis Chest x-ray is negative We will get sputum culture and sensitivity if patient can provide a sample Azithromycin Bronchodilator treatments  incentive spirometry  #Acute COPD Centimeters 125 mg IV was given in the emergency department we will continue Solu-Medrol 60 mg IV and changed to p.o. prednisone tomorrow if patient is clinically improving Bronchodilator treatments-and albuterol as needed duo nebs every 6 hours Singulair to be continued  #Essential hypertension Blood pressure is stable.  Continue home medication amlodipine and Toprol  #Diabetes mellitus Continue glipizide and hold sitagliptin  provide sliding scale insulin   DVT prophylaxis with Lovenox subcu GI prophylaxis with PPI   All the records are reviewed and case discussed with ED provider. Management plans discussed with the patient, family and they are in agreement.  CODE STATUS: fc   TOTAL TIME TAKING CARE OF THIS PATIENT: 43 minutes.   Note: This dictation was prepared with Dragon dictation along with smaller phrase technology. Any transcriptional errors that result from this process are unintentional.  Nicholes Mango M.D on 11/15/2017 at 2:20 PM  Between 7am to 6pm - Pager - [phone]  After 6pm go to [email or web address] - password [email or web address] [facility]  [facility]  Office  [phone]  CC: Primary care physician; Chester Holstein, MD

## 2017-11-15 NOTE — ED Notes (Signed)
Patient transported to X-ray 

## 2017-11-16 LAB — RESPIRATORY PANEL BY PCR
ADENOVIRUS-RVPPCR: NOT DETECTED
Bordetella pertussis: NOT DETECTED
CORONAVIRUS NL63-RVPPCR: NOT DETECTED
CORONAVIRUS OC43-RVPPCR: NOT DETECTED
Chlamydophila pneumoniae: NOT DETECTED
Coronavirus 229E: NOT DETECTED
Coronavirus HKU1: NOT DETECTED
INFLUENZA A-RVPPCR: NOT DETECTED
Influenza B: NOT DETECTED
Metapneumovirus: NOT DETECTED
Mycoplasma pneumoniae: NOT DETECTED
PARAINFLUENZA VIRUS 1-RVPPCR: NOT DETECTED
PARAINFLUENZA VIRUS 2-RVPPCR: NOT DETECTED
PARAINFLUENZA VIRUS 3-RVPPCR: DETECTED — AB
PARAINFLUENZA VIRUS 4-RVPPCR: NOT DETECTED
RHINOVIRUS / ENTEROVIRUS - RVPPCR: NOT DETECTED
Respiratory Syncytial Virus: NOT DETECTED

## 2017-11-16 LAB — GLUCOSE, CAPILLARY
Glucose-Capillary: 427 mg/dL — ABNORMAL HIGH (ref 65–99)
Glucose-Capillary: 297 mg/dL — ABNORMAL HIGH (ref 65–99)
Glucose-Capillary: 366 mg/dL — ABNORMAL HIGH (ref 65–99)
Glucose-Capillary: 352 mg/dL — ABNORMAL HIGH (ref 65–99)
Glucose-Capillary: 359 mg/dL — ABNORMAL HIGH (ref 65–99)

## 2017-11-16 LAB — HEMOGLOBIN A1C
Hgb A1c MFr Bld: 7.3 % — ABNORMAL HIGH (ref 4.8–5.6)
Mean Plasma Glucose: 162.81 mg/dL

## 2017-11-16 MED ORDER — MENTHOL 3 MG MT LOZG
1.0000 | LOZENGE | OROMUCOSAL | Status: DC | PRN
  Administered 2017-11-16: 3 mg via ORAL
  Filled 2017-11-16: qty 9

## 2017-11-16 MED ORDER — PANTOPRAZOLE SODIUM 40 MG PO TBEC
40.0000 mg | DELAYED_RELEASE_TABLET | Freq: Every day | ORAL | Status: DC
  Administered 2017-11-16 – 2017-11-18 (×3): 40 mg via ORAL
  Filled 2017-11-16 (×3): qty 1

## 2017-11-16 MED ORDER — BUDESONIDE 0.5 MG/2ML IN SUSP
0.5000 mg | Freq: Two times a day (BID) | RESPIRATORY_TRACT | Status: DC
  Administered 2017-11-16 – 2017-11-18 (×5): 0.5 mg via RESPIRATORY_TRACT
  Filled 2017-11-16 (×5): qty 2

## 2017-11-16 MED ORDER — PREMIER PROTEIN SHAKE
11.0000 [oz_av] | Freq: Two times a day (BID) | ORAL | Status: DC
  Administered 2017-11-16 – 2017-11-18 (×5): 11 [oz_av] via ORAL

## 2017-11-16 MED ORDER — METHYLPREDNISOLONE SODIUM SUCC 40 MG IJ SOLR
40.0000 mg | Freq: Every day | INTRAMUSCULAR | Status: DC
  Administered 2017-11-16 – 2017-11-18 (×3): 40 mg via INTRAVENOUS
  Filled 2017-11-16 (×3): qty 1

## 2017-11-16 MED ORDER — HEPARIN SODIUM (PORCINE) 5000 UNIT/ML IJ SOLN
5000.0000 [IU] | Freq: Three times a day (TID) | INTRAMUSCULAR | Status: DC
  Administered 2017-11-16 – 2017-11-18 (×5): 5000 [IU] via SUBCUTANEOUS
  Filled 2017-11-16 (×5): qty 1

## 2017-11-16 NOTE — Evaluation (Signed)
Clinical/Bedside Swallow Evaluation Patient Details  Name: Andrea [name] MRN: [id number] Date of Birth: 1945/12/05  Today's Date: 11/16/2017 Time: SLP Start Time (ACUTE ONLY): 53 SLP Stop Time (ACUTE ONLY): 1310 SLP Time Calculation (min) (ACUTE ONLY): 50 min  Past Medical History:  Past Medical History:  Diagnosis Date  . COPD (chronic obstructive pulmonary disease) ([place])   . Diabetes mellitus without complication ([place])   . Heart attack ([place])   . Renal disorder    Past Surgical History:  Past Surgical History:  Procedure Laterality Date  . ABDOMINAL HYSTERECTOMY    . CARDIAC SURGERY     stents   HPI:  Pt is a 72 y.o. female with a known history of COPD, DM, CKD, GERD per pt report who is presenting to the ED with a chief complaint of worsening of shortness of breath and cough for the past 2 to 3 days.  Patient does not live on oxygen at home.  Influenza test is negative and a chest x-ray is negative in the ED.  Patient is started on IV Solu-Medrol as she was hypoxemic and she was received a bronchodilator treatments.  Hospitalist team is called to admit the patient as there is no significant improvement after bronchodilator treatments and IV Solu-Medrol.  Patient is resting comfortably during my examination.  Patient reports while resting she is  not short of breath. Pt reports "very acidy reflux" but only taking a PPI 1x daily since "cutting back from 2x a day". Pt also stated she has awakened to have to go to the bathroom to cough/spit up. She does follow some REFLUX precautions at home. She also reports to feeling dry-mouthed - is on multiple medications, has to monitor fluid intake d/t CKD, has Reflux.    Assessment / Plan / Recommendation Clinical Impression  Pt appears to present w/ adequate oropharyngeal phase swallow function; she appears at reduced risk for aspiration from an oropharyngeal phase standpoint when following general aspiration precautions. Pt consumed trials of  thin liquids via cup/straw and solids w/ no immediate, overt coughing noted; no decline in vocal quality or respiratory status during/post po's. Oral phase appeared [facility] for boluses given, however, pt does wear U/L dentures w/ dry mouth from the adhesive she feels. Encouraged pt to moisten foods well, chew carefully and take smaller bites - avoid tougher solid foods difficult to chew(especially meats). Pt fed self; conversive w/ SLP. She described moderate s/s of REFLUX stating she is on a PPI but only 1x daily. Educated pt on general REFLUX precautions and encouraged her to f/u w/ her GI for REFLUX/GERD management - ANY Reflux/Regurgitated material could impact the Pulmonary system and pose risk for Pulmonary decline(she has described such Regurgitation at night at home). Recommend continue a regular diet w/ meats moistened and eaten in small bites/pieces, less bread w/ thin liquids; general aspiration precautions; REFLUX precautions. Handouts given. Pills in puree if needed d/t difficulty swallowing w/ liquids. No further ST services indicated at this time. NSG to reconsult if needed. MD updated.  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 General aspiration precautions; REFLUX precautions.  Medication Administration: Whole meds with liquid(as tolerates)    Other  Recommendations Recommended Consults: Consider GI evaluation(for GERD management) Oral Care Recommendations: Oral care BID;Patient independent with oral care  Other Recommendations: (n/a)   Follow up Recommendations None      Frequency and Duration (n/a)  (n/a)       Prognosis Prognosis for Safe Diet Advancement: Good Barriers to Reach Goals: (n/a)      Swallow Study   General Date of Onset:  11/15/17 HPI: Pt is a 72 y.o. female with a known history of COPD, DM, CKD, GERD per pt report who is presenting to the ED with a chief complaint of worsening of shortness of breath and cough for the past 2 to 3 days.  Patient does not live on oxygen at home.  Influenza test is negative and a chest x-ray is negative in the ED.  Patient is started on IV Solu-Medrol as she was hypoxemic and she was received a bronchodilator treatments.  Hospitalist team is called to admit the patient as there is no significant improvement after bronchodilator treatments and IV Solu-Medrol.  Patient is resting comfortably during my examination.  Patient reports while resting she is  not short of breath. Pt reports "very acidy reflux" but only taking a PPI 1x daily since "cutting back from 2x a day". Pt also stated she has awakened to have to go to the bathroom to cough/spit up. She does follow some REFLUX precautions at home. She also reports to feeling dry-mouthed - is on multiple medications, has to monitor fluid intake d/t CKD, has Reflux.  Type of Study: Bedside Swallow Evaluation Previous Swallow Assessment: none Diet Prior to this Study: Regular;Thin liquids Temperature Spikes Noted: No(wbc 6.7) Respiratory Status: Nasal cannula(2 liters) History of Recent Intubation: No Behavior/Cognition: Alert;Cooperative;Pleasant mood Oral Cavity Assessment: Within Functional Limits Oral Care Completed by SLP: Recent completion by staff Oral Cavity - Dentition: Dentures, top;Dentures, bottom Vision: Functional for self-feeding Self-Feeding Abilities: Able to feed self Patient Positioning: Upright in bed Baseline Vocal Quality: Normal Volitional Cough: Strong Volitional Swallow: Able to elicit    Oral/Motor/Sensory Function Overall Oral Motor/Sensory Function: Within functional limits   Ice Chips Ice chips: Not tested   Thin Liquid Thin Liquid: Within functional limits Presentation: Cup;Self Fed;Straw(~4 ozs )    Nectar  Thick Nectar Thick Liquid: Not tested   Honey Thick Honey Thick Liquid: Not tested   Puree Puree: Not tested   Solid   GO   Solid: Within functional limits Presentation: Self Fed;Spoon(4 trials) Other Comments: pt c/o the white meat chicken being "very dry"; she felt it was "worse" if she tried overly chewing it and that it just became "a pulp".       Orinda Kenner, MS, CCC-SLP Marquisa Salih 11/16/2017,1:33 PM

## 2017-11-16 NOTE — Progress Notes (Signed)
Initial Nutrition Assessment  DOCUMENTATION CODES:   Not applicable  INTERVENTION:   Premier Protein BID, each supplement provides 160 kcal and 30 grams of protein.   Liberalize diet  NUTRITION DIAGNOSIS:   Increased nutrient needs related to catabolic illness(COPD) as evidenced by increased estimated needs.  GOAL:   Patient will meet greater than or equal to 90% of their needs  MONITOR:   Supplement acceptance, PO intake, Labs, Weight trends  REASON FOR ASSESSMENT:   Consult COPD Protocol  ASSESSMENT:    72 y.o. female with a known history of COPD, DM, CKD, is presented to the ED with a chief complaint of worsening of shortness of breath and cough for the past 2 to 3 days. Admitted for COPD exacerbation and bronchitis    Met with pt in room today. Pt reports good appetite and oral intake pta and currently. Pt eating 100% of meals in hospital. Pt drinks Premier Protein at home. Per chart, pt appears wt stable. Pt reports her UBW around 153-155lbs; pt weighs regularly at home. RD discussed with pt the importance of adequate protein intake with COPD. RD will order supplements and liberalize diet as a heart healthy diet restricts protein also.   Medications reviewed and include: azithromycin, colace, heparin, insulin, solu-medrol   Labs reviewed: BUN 21(H), creat 1.55(H) cbgs- 406, 427, 366 x 24hrs AIC 7.2(H)- 08/2017  NUTRITION - FOCUSED PHYSICAL EXAM:    Most Recent Value  Orbital Region  No depletion  Upper Arm Region  No depletion  Thoracic and Lumbar Region  No depletion  Buccal Region  No depletion  Temple Region  No depletion  Clavicle Bone Region  No depletion  Clavicle and Acromion Bone Region  No depletion  Scapular Bone Region  No depletion  Dorsal Hand  No depletion  Patellar Region  No depletion  Anterior Thigh Region  No depletion  Posterior Calf Region  No depletion  Edema (RD Assessment)  None  Hair  Reviewed  Eyes  Reviewed  Mouth  Reviewed   Skin  Reviewed  Nails  Reviewed     Diet Order:   Diet Order           Diet regular Room service appropriate? Yes; Fluid consistency: Thin  Diet effective now         EDUCATION NEEDS:   Education needs have been addressed  Skin:  Skin Assessment: Reviewed RN Assessment  Last BM:  5/23  Height:   Ht Readings from Last 1 Encounters:  11/15/17 5' 2" (1.575 m)    Weight:   Wt Readings from Last 1 Encounters:  11/16/17 161 lb 12.8 oz (73.4 kg)    Ideal Body Weight:  50 kg  BMI:  Body mass index is 29.59 kg/m.  Estimated Nutritional Needs:   Kcal:  1400-1600kcal/day   Protein:  70-83g/day   Fluid:  >1.3L/day     MS, RD, LDN Pager #- [phone] Office#- [phone] After Hours Pager: [phone]  

## 2017-11-16 NOTE — Progress Notes (Signed)
SATURATION QUALIFICATIONS: (This note is used to comply with regulatory documentation for home oxygen)  Patient Saturations on Room Air at Rest =94%  Patient Saturations on Room Air while Ambulating = 91%  Patient Saturations on  Liters of oxygen while Ambulating = %  Please briefly explain why patient needs home oxygen:

## 2017-11-16 NOTE — Progress Notes (Signed)
Inpatient Diabetes Program Recommendations  AACE/ADA: New Consensus Statement on Inpatient Glycemic Control (2015)  Target Ranges:  Prepandial:   less than 140 mg/dL      Peak postprandial:   less than 180 mg/dL (1-2 hours)      Critically ill patients:  140 - 180 mg/dL   Lab Results  Component Value Date   GLUCAP 427 (H) 11/15/2017   HGBA1C 7.2 (H) 09/21/2017    Review of Glycemic ControlResults for [name], Andrea [name] (MRN [id number]) as of 11/16/2017 09:48  Ref. Range 09/26/2017 11:46 11/15/2017 16:16 11/15/2017 20:36  Glucose-Capillary Latest Ref Range: 65 - 99 mg/dL 132 (H) 406 (H) 427 (H)   Diabetes history: Type 2 DM  Outpatient Diabetes medications: Glucotrol 10 mg bid, Januvia 50 mg daily Current orders for Inpatient glycemic control: Lantus 12 units daily, Novolog moderate tid with meals and HS, Solumedrol 40 mg IV daily Inpatient Diabetes Program Recommendations:   Referral received. Note steroids have been reduced to once daily.  A1C in March was 7.2%.  CBG this morning=297 mg/dL.  Agree with current orders will follow.   Thanks,  Andrea Perl, RN, BC-ADM Inpatient Diabetes Coordinator Pager [phone] (8a-5p)

## 2017-11-16 NOTE — Progress Notes (Signed)
Patient ID: Andrea [name], female   DOB: 13-May-1946, 72 y.o.   MRN: [id number]  [facility] PROGRESS NOTE  Andrea [name] FAO:[id number] DOB: 1946-06-16 DOA: 11/15/2017 PCP: Chester Holstein, MD  HPI/Subjective: Patient feeling better than when she came in.  Still coughing but now nonproductive.  Sugars were elevated with high-dose steroids yesterday.  Complains of food getting caught.  She took a drink and it flushed it down.  Objective: Vitals:   11/16/17 0735 11/16/17 0811  BP:  (!) 112/53  Pulse:  94  Resp:  20  Temp:  97.8 F (36.6 C)  SpO2: 96% 98%    Filed Weights   11/15/17 1136 11/15/17 1446 11/16/17 1202  Weight: 68 kg (150 lb) 63.8 kg (140 lb 9.6 oz) 73.4 kg (161 lb 12.8 oz)    ROS: Review of Systems  Constitutional: Negative for chills and fever.  Eyes: Negative for blurred vision.  Respiratory: Positive for cough, shortness of breath and wheezing.   Cardiovascular: Negative for chest pain.  Gastrointestinal: Negative for abdominal pain, constipation, diarrhea, nausea and vomiting.  Genitourinary: Negative for dysuria.  Musculoskeletal: Negative for joint pain.  Neurological: Negative for dizziness and headaches.   Exam: Physical Exam  Constitutional: She is oriented to person, place, and time.  HENT:  Nose: No mucosal edema.  Mouth/Throat: No oropharyngeal exudate or posterior oropharyngeal edema.  Eyes: Pupils are equal, round, and reactive to light. Conjunctivae, EOM and lids are normal.  Neck: No JVD present. Carotid bruit is not present. No edema present. No thyroid mass and no thyromegaly present.  Cardiovascular: S1 normal and S2 normal. Exam reveals no gallop.  No murmur heard. Pulses:      Dorsalis pedis pulses are 2+ on the right side, and 2+ on the left side.  Respiratory: No respiratory distress. She has decreased breath sounds in the right middle field, the right lower field, the left middle field and the left lower field. She has  wheezes in the right middle field, the right lower field, the left middle field and the left lower field. She has no rhonchi. She has no rales.  GI: Soft. Bowel sounds are normal. There is no tenderness.  Musculoskeletal:       Right ankle: She exhibits no swelling.       Left ankle: She exhibits no swelling.  Lymphadenopathy:    She has no cervical adenopathy.  Neurological: She is alert and oriented to person, place, and time. No cranial nerve deficit.  Skin: Skin is warm. No rash noted. Nails show no clubbing.  Psychiatric: She has a normal mood and affect.      Data Reviewed: Basic Metabolic Panel: Recent Labs  Lab 11/15/17 1147  NA 136  K 4.2  CL 102  CO2 26  GLUCOSE 211*  BUN 21*  CREATININE 1.55*  CALCIUM 9.0   CBC: Recent Labs  Lab 11/15/17 1147  WBC 6.7  NEUTROABS 4.6  HGB 11.9*  HCT 35.6  MCV 87.5  PLT 171   Cardiac Enzymes: Recent Labs  Lab 11/15/17 1147  TROPONINI <0.03    CBG: Recent Labs  Lab 11/15/17 1616 11/15/17 2036 11/16/17 1148  GLUCAP 406* 427* 366*    Recent Results (from the past 240 hour(s))  Respiratory Panel by PCR     Status: Abnormal   Collection Time: 11/16/17  9:17 AM  Result Value Ref Range Status   Adenovirus NOT DETECTED NOT DETECTED Final   Coronavirus 229E NOT DETECTED NOT DETECTED  Final   Coronavirus HKU1 NOT DETECTED NOT DETECTED Final   Coronavirus NL63 NOT DETECTED NOT DETECTED Final   Coronavirus OC43 NOT DETECTED NOT DETECTED Final   Metapneumovirus NOT DETECTED NOT DETECTED Final   Rhinovirus / Enterovirus NOT DETECTED NOT DETECTED Final   Influenza A NOT DETECTED NOT DETECTED Final   Influenza B NOT DETECTED NOT DETECTED Final   Parainfluenza Virus 1 NOT DETECTED NOT DETECTED Final   Parainfluenza Virus 2 NOT DETECTED NOT DETECTED Final   Parainfluenza Virus 3 DETECTED (A) NOT DETECTED Final   Parainfluenza Virus 4 NOT DETECTED NOT DETECTED Final   Respiratory Syncytial Virus NOT DETECTED NOT DETECTED  Final   Bordetella pertussis NOT DETECTED NOT DETECTED Final   Chlamydophila pneumoniae NOT DETECTED NOT DETECTED Final   Mycoplasma pneumoniae NOT DETECTED NOT DETECTED Final    Comment: Performed at [facility], [place] [address], [place], [place] [address]     Studies: Dg Chest 2 View  Result Date: 11/15/2017 CLINICAL DATA:  Shortness of breath. EXAM: CHEST - 2 VIEW COMPARISON:  09/21/2017 FINDINGS: There is bilateral chronic interstitial thickening. There is no focal consolidation. There is no pleural effusion or pneumothorax. The heart and mediastinal contours are unremarkable. The osseous structures are unremarkable. IMPRESSION: No active cardiopulmonary disease. Electronically Signed   By: Kathreen Devoid   On: 11/15/2017 12:18    Scheduled Meds: . azithromycin  250 mg Oral Daily  . budesonide (PULMICORT) nebulizer solution  0.5 mg Nebulization BID  . docusate sodium  100 mg Oral BID  . heparin injection (subcutaneous)  5,000 Units Subcutaneous Q8H  . insulin aspart  0-15 Units Subcutaneous TID WC  . insulin aspart  0-5 Units Subcutaneous QHS  . insulin glargine  12 Units Subcutaneous QHS  . ipratropium-albuterol  3 mL Nebulization QID  . methylPREDNISolone (SOLU-MEDROL) injection  40 mg Intravenous Daily  . pantoprazole  40 mg Oral Daily  . protein supplement shake  11 oz Oral BID BM   Continuous Infusions:  Assessment/Plan:  1. Acute hypoxic respiratory failure.  Pulse ox 88% on room air on presentation.  Continue oxygen supplementation.  Check pulse ox tomorrow morning on room air with ambulation. 2. COPD exacerbation.  Triggered by parainfluenza virus 3.  Continue Solu-Medrol, DuoNeb and budesonide nebulizers.  On Zithromax. 3. Type 2 diabetes mellitus on glargine insulin and sliding scale 4. GERD and food getting caught.  Start Protonix 40 mg daily 5. History of CAD  Code Status:     Code Status Orders  (From admission, onward)        Start     Ordered    11/16/17 0648  Full code  Continuous     11/16/17 0648    Code Status History    Date Active Date Inactive Code Status Order ID Comments User Context   09/22/2017 0049 09/26/2017 1618 DNR [id number]  Hillary Bow, MD ED      Disposition Plan: Home once breathing better and off oxygen  Antibiotics:  Zithromax  Time spent: 28 minutes  [place]

## 2017-11-17 LAB — GLUCOSE, CAPILLARY
GLUCOSE-CAPILLARY: 251 mg/dL — AB (ref 65–99)
GLUCOSE-CAPILLARY: 149 mg/dL — AB (ref 65–99)
Glucose-Capillary: 291 mg/dL — ABNORMAL HIGH (ref 65–99)
Glucose-Capillary: 403 mg/dL — ABNORMAL HIGH (ref 65–99)

## 2017-11-17 MED ORDER — SODIUM CHLORIDE 0.9% FLUSH
3.0000 mL | Freq: Two times a day (BID) | INTRAVENOUS | Status: DC
  Administered 2017-11-17 – 2017-11-18 (×2): 3 mL via INTRAVENOUS

## 2017-11-17 MED ORDER — INSULIN ASPART 100 UNIT/ML ~~LOC~~ SOLN
12.0000 [IU] | Freq: Once | SUBCUTANEOUS | Status: AC
  Administered 2017-11-17: 12 [IU] via SUBCUTANEOUS
  Filled 2017-11-17: qty 1

## 2017-11-17 NOTE — Progress Notes (Signed)
Patient ID: Andrea [name], female   DOB: September 28, 1945, 72 y.o.   MRN: [id number]  [facility] PROGRESS NOTE  Andrea [name] MWU:[id number] DOB: 17-Oct-1945 DOA: 11/15/2017 PCP: Chester Holstein, MD  HPI/Subjective: Patient had an episode last night where she became very short of breath and can hardly breathe.  She is feeling better today and able to breathe a little bit better.  Still with some cough and wheeze.  Objective: Vitals:   11/17/17 0733 11/17/17 0746  BP: (!) 131/59   Pulse: 75   Resp: 18   Temp: 97.7 F (36.5 C)   SpO2: 95% 97%    Filed Weights   11/15/17 1136 11/15/17 1446 11/16/17 1202  Weight: 68 kg (150 lb) 63.8 kg (140 lb 9.6 oz) 73.4 kg (161 lb 12.8 oz)    ROS: Review of Systems  Constitutional: Negative for chills and fever.  Eyes: Negative for blurred vision.  Respiratory: Positive for cough, shortness of breath and wheezing.   Cardiovascular: Negative for chest pain.  Gastrointestinal: Negative for abdominal pain, constipation, diarrhea, nausea and vomiting.  Genitourinary: Negative for dysuria.  Musculoskeletal: Negative for joint pain.  Neurological: Negative for dizziness and headaches.   Exam: Physical Exam  Constitutional: She is oriented to person, place, and time.  HENT:  Nose: No mucosal edema.  Mouth/Throat: No oropharyngeal exudate or posterior oropharyngeal edema.  Eyes: Pupils are equal, round, and reactive to light. Conjunctivae, EOM and lids are normal.  Neck: No JVD present. Carotid bruit is not present. No edema present. No thyroid mass and no thyromegaly present.  Cardiovascular: S1 normal and S2 normal. Exam reveals no gallop.  No murmur heard. Pulses:      Dorsalis pedis pulses are 2+ on the right side, and 2+ on the left side.  Respiratory: No respiratory distress. She has decreased breath sounds in the right middle field, the right lower field, the left middle field and the left lower field. She has wheezes in the right  lower field and the left lower field. She has no rhonchi. She has no rales.  GI: Soft. Bowel sounds are normal. There is no tenderness.  Musculoskeletal:       Right ankle: She exhibits no swelling.       Left ankle: She exhibits no swelling.  Lymphadenopathy:    She has no cervical adenopathy.  Neurological: She is alert and oriented to person, place, and time. No cranial nerve deficit.  Skin: Skin is warm. No rash noted. Nails show no clubbing.  Psychiatric: She has a normal mood and affect.      Data Reviewed: Basic Metabolic Panel: Recent Labs  Lab 11/15/17 1147  NA 136  K 4.2  CL 102  CO2 26  GLUCOSE 211*  BUN 21*  CREATININE 1.55*  CALCIUM 9.0   CBC: Recent Labs  Lab 11/15/17 1147  WBC 6.7  NEUTROABS 4.6  HGB 11.9*  HCT 35.6  MCV 87.5  PLT 171   Cardiac Enzymes: Recent Labs  Lab 11/15/17 1147  TROPONINI <0.03    CBG: Recent Labs  Lab 11/16/17 1148 11/16/17 1623 11/16/17 2252 11/17/17 0736 11/17/17 1144  GLUCAP 366* 352* 359* 251* 149*    Recent Results (from the past 240 hour(s))  Respiratory Panel by PCR     Status: Abnormal   Collection Time: 11/16/17  9:17 AM  Result Value Ref Range Status   Adenovirus NOT DETECTED NOT DETECTED Final   Coronavirus 229E NOT DETECTED NOT DETECTED Final  Coronavirus HKU1 NOT DETECTED NOT DETECTED Final   Coronavirus NL63 NOT DETECTED NOT DETECTED Final   Coronavirus OC43 NOT DETECTED NOT DETECTED Final   Metapneumovirus NOT DETECTED NOT DETECTED Final   Rhinovirus / Enterovirus NOT DETECTED NOT DETECTED Final   Influenza A NOT DETECTED NOT DETECTED Final   Influenza B NOT DETECTED NOT DETECTED Final   Parainfluenza Virus 1 NOT DETECTED NOT DETECTED Final   Parainfluenza Virus 2 NOT DETECTED NOT DETECTED Final   Parainfluenza Virus 3 DETECTED (A) NOT DETECTED Final   Parainfluenza Virus 4 NOT DETECTED NOT DETECTED Final   Respiratory Syncytial Virus NOT DETECTED NOT DETECTED Final   Bordetella pertussis  NOT DETECTED NOT DETECTED Final   Chlamydophila pneumoniae NOT DETECTED NOT DETECTED Final   Mycoplasma pneumoniae NOT DETECTED NOT DETECTED Final    Comment: Performed at [facility], [place] [address], [place], [place] [address]     Studies: No results found.  Scheduled Meds: . azithromycin  250 mg Oral Daily  . budesonide (PULMICORT) nebulizer solution  0.5 mg Nebulization BID  . docusate sodium  100 mg Oral BID  . heparin injection (subcutaneous)  5,000 Units Subcutaneous Q8H  . insulin aspart  0-15 Units Subcutaneous TID WC  . insulin aspart  0-5 Units Subcutaneous QHS  . insulin glargine  12 Units Subcutaneous QHS  . ipratropium-albuterol  3 mL Nebulization QID  . methylPREDNISolone (SOLU-MEDROL) injection  40 mg Intravenous Daily  . pantoprazole  40 mg Oral Daily  . protein supplement shake  11 oz Oral BID BM   Continuous Infusions:  Assessment/Plan:  1. Acute hypoxic respiratory failure.  Pulse ox 88% on room air on presentation.  Taper off oxygen. 2. COPD exacerbation.  Triggered by parainfluenza virus 3.  Continue Solu-Medrol, DuoNeb and budesonide nebulizers.  On Zithromax. 3. Type 2 diabetes mellitus on glargine insulin and sliding scale 4. GERD and food getting caught.  Started Protonix 40 mg daily normally takes omeprazole at home..  5. History of CAD  Code Status:     Code Status Orders  (From admission, onward)        Start     Ordered   11/16/17 0648  Full code  Continuous     11/16/17 0648    Code Status History    Date Active Date Inactive Code Status Order ID Comments User Context   09/22/2017 0049 09/26/2017 1618 DNR [id number]  Hillary Bow, MD ED      Disposition Plan:  potential discharge tomorrow  Antibiotics:  Zithromax  Time spent: 27 minutes  [place]

## 2017-11-17 NOTE — Plan of Care (Signed)
  Problem: Safety: Goal: Ability to remain free from injury will improve Outcome: Progressing   

## 2017-11-18 LAB — GLUCOSE, CAPILLARY
GLUCOSE-CAPILLARY: 255 mg/dL — AB (ref 65–99)
GLUCOSE-CAPILLARY: 275 mg/dL — AB (ref 65–99)

## 2017-11-18 MED ORDER — PREDNISONE 10 MG PO TABS: ORAL_TABLET | ORAL | 0 refills | Status: DC

## 2017-11-18 MED ORDER — ALBUTEROL SULFATE HFA 108 (90 BASE) MCG/ACT IN AERS: 2.0000 | INHALATION_SPRAY | Freq: Four times a day (QID) | RESPIRATORY_TRACT | 0 refills | Status: AC | PRN

## 2017-11-18 NOTE — Discharge Summary (Signed)
[facility] [place] NAME: Andrea [name]    MR#:  [id number]  DATE OF BIRTH:  02/10/1946  DATE OF ADMISSION:  11/15/2017 ADMITTING PHYSICIAN: Nicholes Mango, MD  DATE OF DISCHARGE: 11/18/2017  1:14 PM  PRIMARY CARE PHYSICIAN: Chester Holstein, MD    ADMISSION DIAGNOSIS:  Hypoxia [R09.02] COPD exacerbation ([place]) [J44.1]  DISCHARGE DIAGNOSIS:  Active Problems:   Acute bronchitis with chronic obstructive pulmonary disease (COPD) ([place])   SECONDARY DIAGNOSIS:   Past Medical History:  Diagnosis Date  . COPD (chronic obstructive pulmonary disease) ([place])   . Diabetes mellitus without complication ([place])   . Heart attack ([name])   . Renal disorder     HOSPITAL COURSE:   1.  Acute hypoxic respiratory failure.  Pulse ox was 88% on room air on presentation.  The patient was tapered off the oxygen during her hospital course and no longer requires it. 2.  COPD exacerbation.  This was triggered by parainfluenza virus #3.  Continue Solu-Medrol here in the hospital and a quick prednisone taper upon going home.  In the hospital we gave DuoNeb nebulizer solution and budesonide.  Patient also received Zithromax in the hospital.  Can go back on her usual inhalers Spiriva, Symbicort and albuterol inhalers. 3.  Type 2 diabetes mellitus on glargine insulin and sliding scale while here.  Can go back on glipizide and Januvia as outpatient.  Hemoglobin A1c 7.3.  Once off the steroid sugar should come down quickly. 4.  GERD and food getting caught in her throat.  Patient was started on Protonix here.  She takes omeprazole at home. 5.  History of CAD 6.  Chronic kidney disease stage III  DISCHARGE CONDITIONS:   Satisfactory  CONSULTS OBTAINED:  None  DRUG ALLERGIES:   Allergies  Allergen Reactions  . Metformin And Related Diarrhea  . Sulfur Hives    DISCHARGE MEDICATIONS:   Allergies as of 11/18/2017      Reactions   Metformin And Related Diarrhea   Sulfur Hives      Medication List    STOP taking these medications   amLODipine 10 MG tablet Commonly known as:  NORVASC   traZODone 50 MG tablet Commonly known as:  DESYREL     TAKE these medications   albuterol 108 (90 Base) MCG/ACT inhaler Commonly known as:  PROVENTIL HFA;VENTOLIN HFA Inhale 2 puffs into the lungs every 6 (six) hours as needed for wheezing or shortness of breath. What changed:  See the new instructions.   aspirin EC 81 MG tablet Take 81 mg by mouth daily.   atorvastatin 40 MG tablet Commonly known as:  LIPITOR Take 40 mg by mouth at bedtime.   benzonatate 100 MG capsule Commonly known as:  TESSALON Take 1 capsule (100 mg total) by mouth 3 (three) times daily. Notes to patient:  For Cough   chlorpheniramine-HYDROcodone 10-8 MG/5ML Suer Commonly known as:  TUSSIONEX Take 5 mLs by mouth at bedtime as needed for cough. Notes to patient:  For cough   fluticasone 50 MCG/ACT nasal spray Commonly known as:  FLONASE 1 spray by Each Nare route daily.   glipiZIDE 10 MG tablet Commonly known as:  GLUCOTROL TAKE ONE TABLET BY MOUTH TWICE DAILY TAKE  30  MINUTES  BEFORE  A  MEAL*   metoprolol succinate 25 MG 24 hr tablet Commonly known as:  TOPROL-XL TAKE ONE TABLET BY MOUTH ONCE DAILY   MULTI-VITAMINS Tabs Take 1 tablet by mouth  daily.   nitroGLYCERIN 0.4 MG SL tablet Commonly known as:  NITROSTAT PLACE 1 TABLET UNDER THE TONGUE EVERY 5 MINUTES AS NEEDED FOR CHEST PAIN   omeprazole 20 MG capsule Commonly known as:  PRILOSEC Take 1 capsule by mouth daily.   predniSONE 10 MG tablet Commonly known as:  DELTASONE 4 tabs po daily for two days What changed:  additional instructions Notes to patient:  Take with food   sitaGLIPtin 50 MG tablet Commonly known as:  JANUVIA Take 50 mg by mouth.   SPIRIVA HANDIHALER 18 MCG inhalation capsule Generic drug:  tiotropium INHALE ONE DOSE BY MOUTH ONCE DAILY Notes to patient:  None today   SYMBICORT  160-4.5 MCG/ACT inhaler Generic drug:  budesonide-formoterol INHALE TWO PUFFS BY MOUTH TWICE DAILY   Vitamin D3 1000 units Caps Take 1 capsule by mouth daily.        DISCHARGE INSTRUCTIONS:   Follow-up with PMD 1 week  If you experience worsening of your admission symptoms, develop shortness of breath, life threatening emergency, suicidal or homicidal thoughts you must seek medical attention immediately by calling 911 or calling your MD immediately  if symptoms less severe.  You Must read complete instructions/literature along with all the possible adverse reactions/side effects for all the Medicines you take and that have been prescribed to you. Take any new Medicines after you have completely understood and accept all the possible adverse reactions/side effects.   Please note  You were cared for by a hospitalist during your hospital stay. If you have any questions about your discharge medications or the care you received while you were in the hospital after you are discharged, you can call the unit and asked to speak with the hospitalist on call if the hospitalist that took care of you is not available. Once you are discharged, your primary care physician will handle any further medical issues. Please note that NO REFILLS for any discharge medications will be authorized once you are discharged, as it is imperative that you return to your primary care physician (or establish a relationship with a primary care physician if you do not have one) for your aftercare needs so that they can reassess your need for medications and monitor your lab values.    Today   CHIEF COMPLAINT:   Chief Complaint  Patient presents with  . Shortness of Breath    HISTORY OF PRESENT ILLNESS:  Andrea [name]  is a 72 y.o. female came in with shortness of breath   VITAL SIGNS:  Blood pressure (!) 140/55, pulse 76, temperature 97.8 F (36.6 C), temperature source Oral, resp. rate 18, height 5\' 2"  (1.575  m), weight 73.4 kg (161 lb 12.8 oz), SpO2 95 %.    PHYSICAL EXAMINATION:  GENERAL:  72 y.o.-year-old patient lying in the bed with no acute distress.  EYES: Pupils equal, round, reactive to light and accommodation. No scleral icterus. Extraocular muscles intact.  HEENT: Head atraumatic, normocephalic. Oropharynx and nasopharynx clear.  NECK:  Supple, no jugular venous distention. No thyroid enlargement, no tenderness.  LUNGS: Decreased breath sounds bilaterally, slight expiratory wheezing.  No rales,rhonchi or crepitation. No use of accessory muscles of respiration.  CARDIOVASCULAR: S1, S2 normal. No murmurs, rubs, or gallops.  ABDOMEN: Soft, non-tender, non-distended. Bowel sounds present. No organomegaly or mass.  EXTREMITIES: No pedal edema, cyanosis, or clubbing.  NEUROLOGIC: Cranial nerves II through XII are intact. Muscle strength 5/5 in all extremities. Sensation intact. Gait not checked.  PSYCHIATRIC: The patient is alert  and oriented x 3.  SKIN: No obvious rash, lesion, or ulcer.   DATA REVIEW:   CBC Recent Labs  Lab 11/15/17 1147  WBC 6.7  HGB 11.9*  HCT 35.6  PLT 171    Chemistries  Recent Labs  Lab 11/15/17 1147  NA 136  K 4.2  CL 102  CO2 26  GLUCOSE 211*  BUN 21*  CREATININE 1.55*  CALCIUM 9.0    Cardiac Enzymes Recent Labs  Lab 11/15/17 1147  [place] <0.03    Microbiology Results  Results for orders placed or performed during the hospital encounter of 11/15/17  Respiratory Panel by PCR     Status: Abnormal   Collection Time: 11/16/17  9:17 AM  Result Value Ref Range Status   Adenovirus NOT DETECTED NOT DETECTED Final   Coronavirus 229E NOT DETECTED NOT DETECTED Final   Coronavirus HKU1 NOT DETECTED NOT DETECTED Final   Coronavirus NL63 NOT DETECTED NOT DETECTED Final   Coronavirus OC43 NOT DETECTED NOT DETECTED Final   Metapneumovirus NOT DETECTED NOT DETECTED Final   Rhinovirus / Enterovirus NOT DETECTED NOT DETECTED Final   Influenza A  NOT DETECTED NOT DETECTED Final   Influenza B NOT DETECTED NOT DETECTED Final   Parainfluenza Virus 1 NOT DETECTED NOT DETECTED Final   Parainfluenza Virus 2 NOT DETECTED NOT DETECTED Final   Parainfluenza Virus 3 DETECTED (A) NOT DETECTED Final   Parainfluenza Virus 4 NOT DETECTED NOT DETECTED Final   Respiratory Syncytial Virus NOT DETECTED NOT DETECTED Final   Bordetella pertussis NOT DETECTED NOT DETECTED Final   Chlamydophila pneumoniae NOT DETECTED NOT DETECTED Final   Mycoplasma pneumoniae NOT DETECTED NOT DETECTED Final    Comment: Performed at [facility], [address], [place], [place] [address]     Management plans discussed with the patient, family and they are in agreement.  CODE STATUS:     Code Status Orders  (From admission, onward)        Start     Ordered   11/16/17 0648  Full code  Continuous     11/16/17 0648    Code Status History    Date Active Date Inactive Code Status Order ID Comments User Context   09/22/2017 0049 09/26/2017 1618 DNR [id number]  Hillary Bow, MD ED      TOTAL TIME TAKING CARE OF THIS PATIENT: 35 minutes.    Loletha Grayer M.D on 11/18/2017 at 1:46 PM  Between 7am to 6pm - Pager - [phone]  After 6pm go to [email or web address] - password [email or web address] [facility]  [phone]  CC: Primary care physician; Chester Holstein, MD

## 2017-11-18 NOTE — Progress Notes (Signed)
Discharge to home shortly.  Her sister is giving her a ride home.

## 2018-01-08 IMAGING — CT CT HIP*R* W/O CM
2 of 3 series · 17 of 46 positions shown, 19 images · non-contrast
Comparison: None.

CLINICAL DATA: Pain after fall.

EXAM:
CT OF THE RIGHT HIP WITHOUT CONTRAST
TECHNIQUE: Multidetector CT imaging of the right hip was performed according to
the standard protocol. Multiplanar CT image reconstructions were
also generated.

[Series 6: coronal st · coronal · 0.39mm/px · 3 of 113 slices shown]
[im 38/113  soft-tissue]
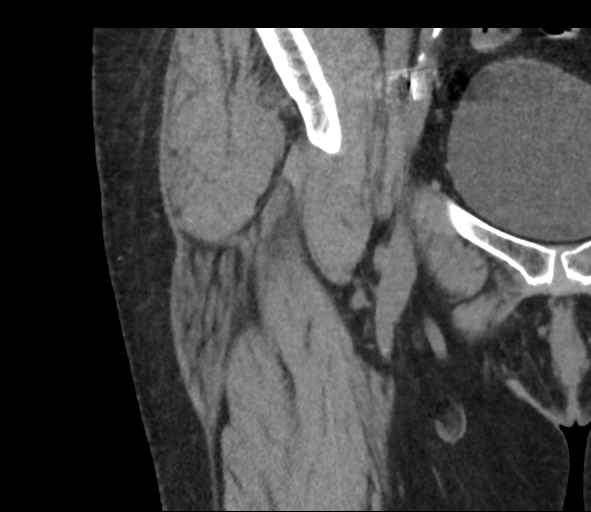
[im 50/113  soft-tissue]
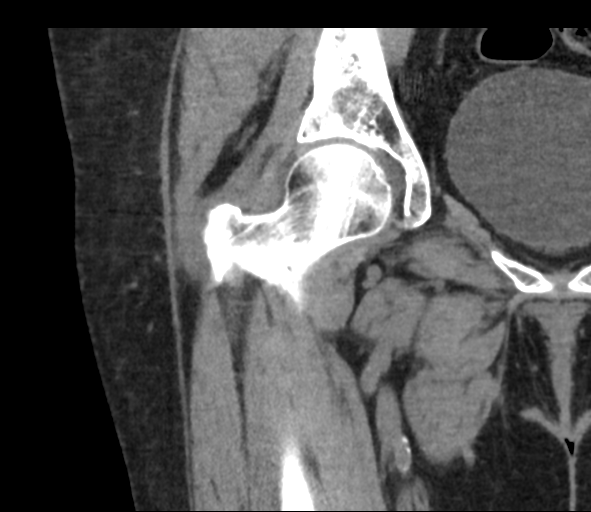
[im 63/113  soft-tissue]
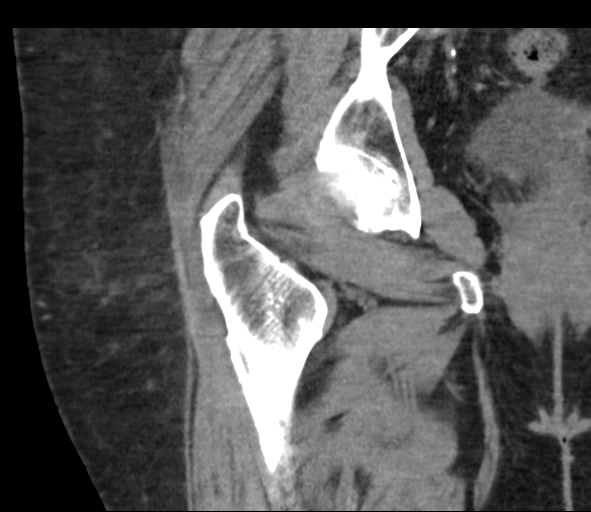

[Series 10: axial st · axial · 0.50mm/px · z∈[-683,-521]mm · 14 of 94 slices shown, 16 images]
[im 7/94  soft-tissue]
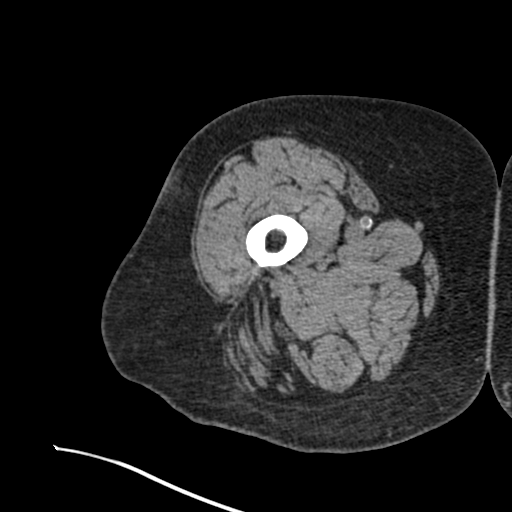
[im 7/94  bone]
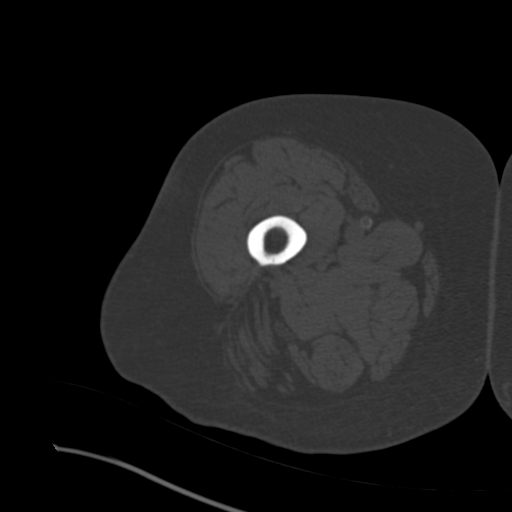
[im 13/94  soft-tissue]
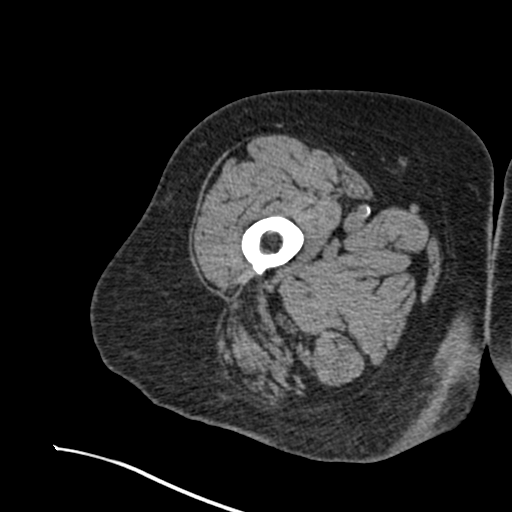
[im 19/94  soft-tissue]
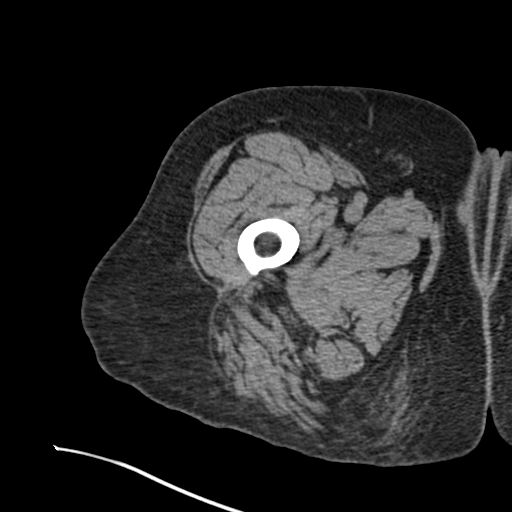
[im 25/94  soft-tissue]
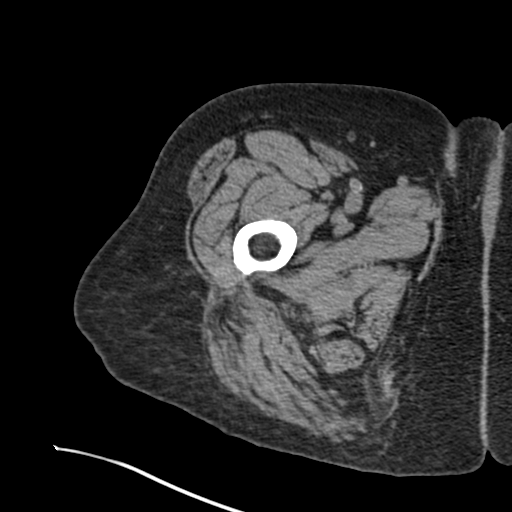
[im 31/94  soft-tissue]
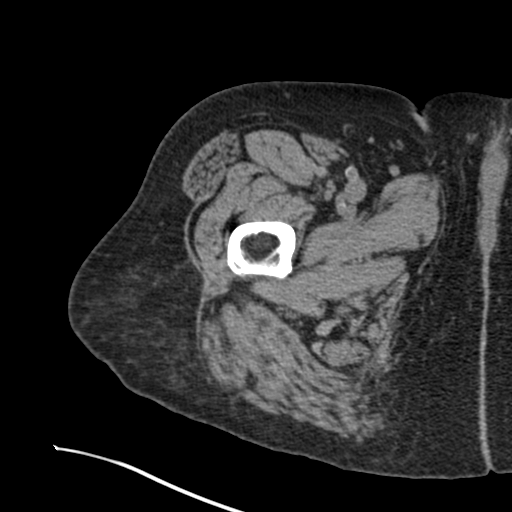
[im 37/94  soft-tissue]
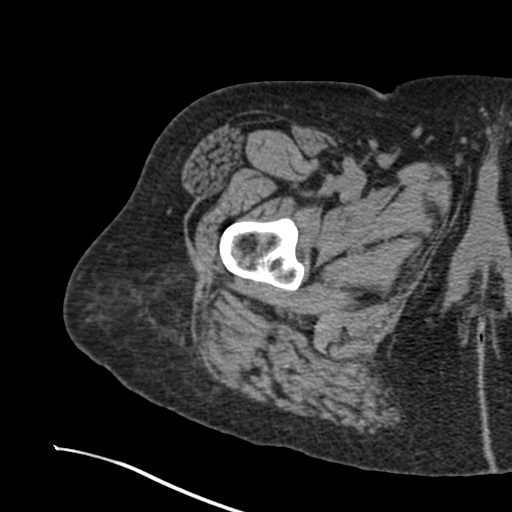
[im 43/94  soft-tissue]
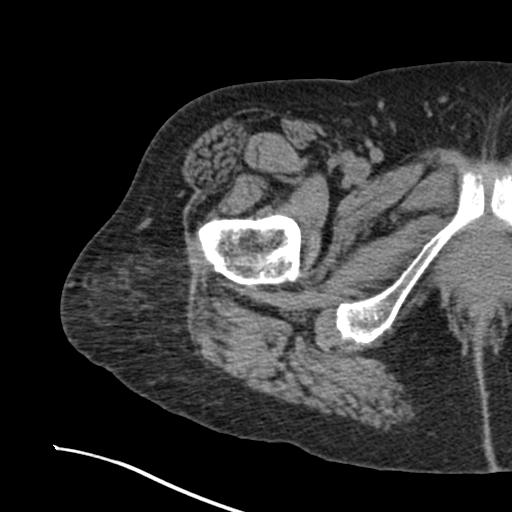
[im 52/94  soft-tissue]
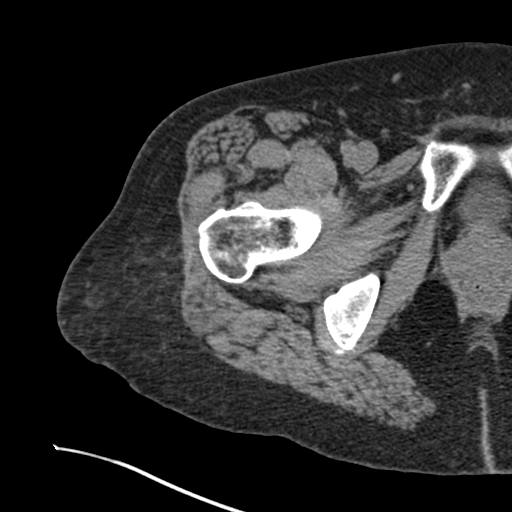
[im 58/94  soft-tissue]
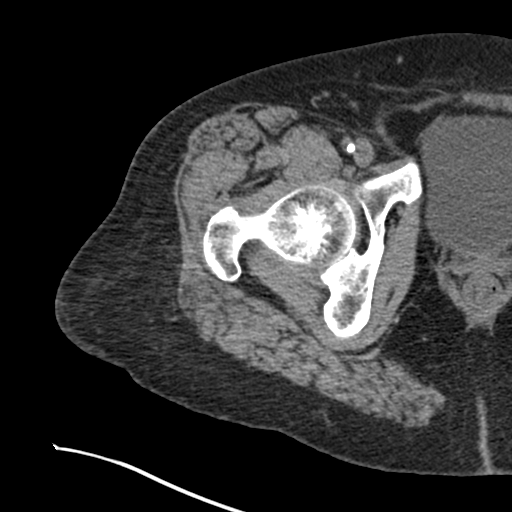
[im 58/94  bone]
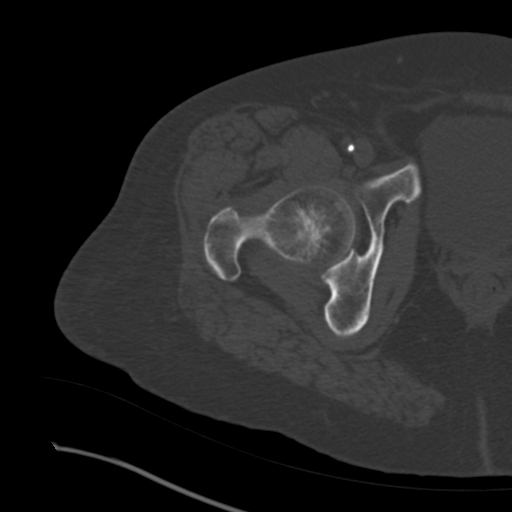
[im 64/94  soft-tissue]
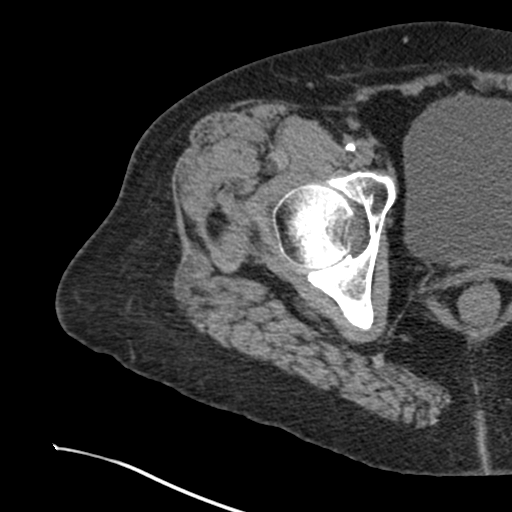
[im 70/94  soft-tissue]
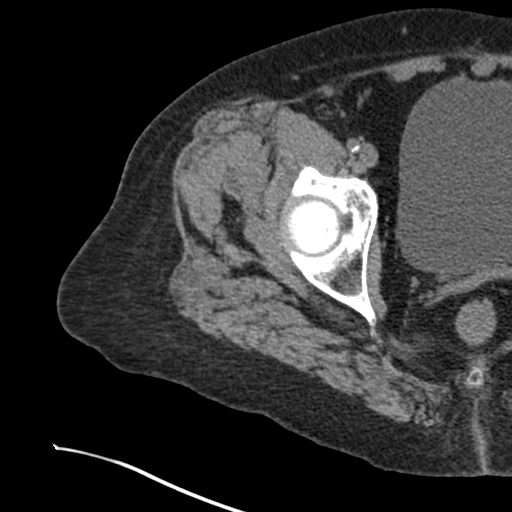
[im 76/94  soft-tissue]
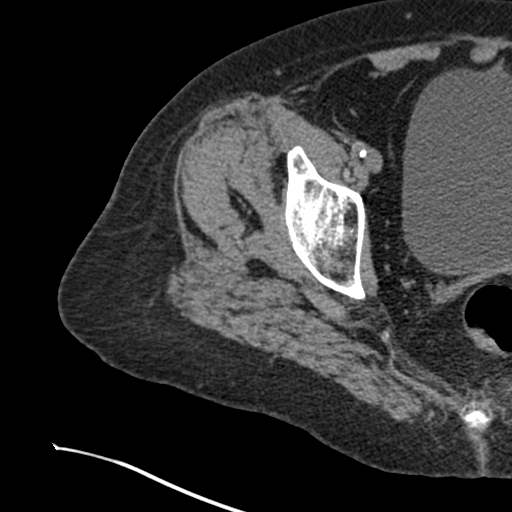
[im 82/94  soft-tissue]
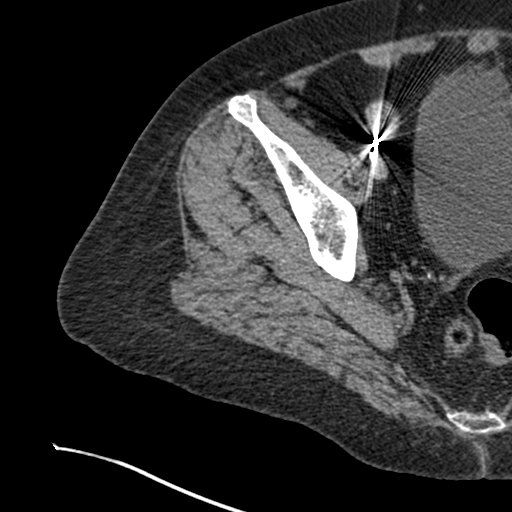
[im 88/94  soft-tissue]
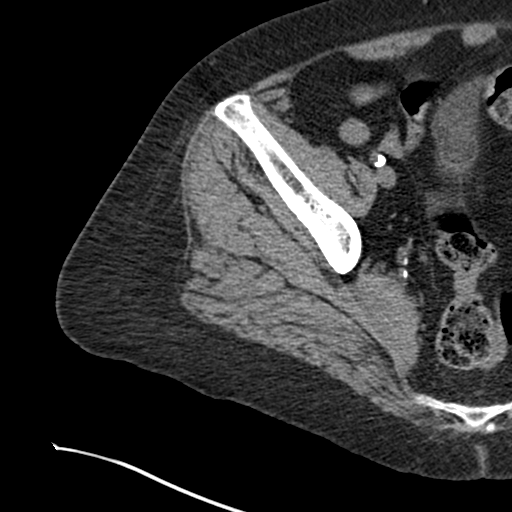

[17 of 46 positions shown; findings below may reference images not displayed]

FINDINGS: Bones/Joint/Cartilage

No fracture of the right hip. Mild posterior hip joint space
narrowing which suggests probable mild cartilaginous thinning
posteriorly. Deep right superior and inferior pubic rami as well as
acetabulum appears intact.

Ligaments

Suboptimally assessed by CT.

Muscles and Tendons

Nonacute

Soft tissues

Surgical clips noted along the right external iliac artery and vein.
Mild soft tissue induration along the lateral aspect of the right
hip that soft tissue hematoma. No intramuscular hematoma. The
visualized bladder is nonacute. Hysterectomy. No adnexal mass.
IMPRESSION: Mild soft tissue contusion overlying the right hip without acute
underlying osseous abnormality or malalignment.

## 2018-05-28 DIAGNOSIS — M1A0790 Idiopathic chronic gout, unspecified ankle and foot, without tophus (tophi): Secondary | ICD-10-CM | POA: Insufficient documentation

## 2018-08-08 DIAGNOSIS — R0609 Other forms of dyspnea: Secondary | ICD-10-CM | POA: Insufficient documentation

## 2018-09-09 DIAGNOSIS — J948 Other specified pleural conditions: Secondary | ICD-10-CM | POA: Insufficient documentation

## 2018-09-09 DIAGNOSIS — I2699 Other pulmonary embolism without acute cor pulmonale: Secondary | ICD-10-CM | POA: Insufficient documentation

## 2018-12-10 ENCOUNTER — Other Ambulatory Visit: Payer: Self-pay

## 2018-12-10 ENCOUNTER — Encounter: Payer: Medicare Other | Attending: Cardiology | Admitting: *Deleted

## 2018-12-10 DIAGNOSIS — Z951 Presence of aortocoronary bypass graft: Secondary | ICD-10-CM

## 2018-12-10 NOTE — Progress Notes (Signed)
Confirm Consent - "In the setting of the current Covid19 crisis, you are scheduled to join our "At Home" [facility] or Pulmonary  Rehab program . Just as we do with many in-gym visits, in order for you to participate in this program, we must obtain consent.  If you'd like, I can send this to your mychart (if signed up) or email for you to review.  Otherwise, I can obtain your verbal consent now.  By agreeing to a Cardiac or Pulmonary Rehab Telehealth visit, we'd like you to understand that the technology does not allow for your Cardiac or Pulmonary Rehab team member to perform a physical assessment, and thus may limit their ability to fully assess your ability to perform exercise programs. If your provider identifies any concerns that need to be evaluated in person, we will make arrangements to do so.  Finally, though the technology is pretty good, we cannot assure that it will always work on either your or our end and we cannot ensure that we have a secure connection.  Cardiac and Pulmonary Rehab Telehealth visits and "At Home" cardiac and pulmonary rehab are provided at no cost to you. Are you willing to proceed?" STAFF: Did the patient verbally acknowledge consent to telehealth visit? Document YES/NO here: YES   Date and Time   1041 12/10/2018                                             Staff completing consent process: Heath Lark RN BSN CCRP    Email: [email or web address]   Phone: [phone] Diagnosis: CONSENT COMPLETED: Yes  Risk Stratification:high Risk Factors:  prior MI, Diabetes Mellitus, hypertension, hypercholesterolemia/hyperlipidemia, COPD Current Exercise: walking Patient Exercise Barriers :wearing oxygen   2l sleeping and with activity Mobility Assistive Device at Home:  walker  In spaces that are not familiar Vital Sign Devices at [facility] oximetry Exercise Equipment at Home: none Followup appointment made: Yes To use [facility]:Yes  Entered on Dashboard Yes  SMS sent  with invite Yes   Has exercise room Bike and TM at building she lives in to use when all clear to use    Was exercising 3 days a week before going to the hospital Appt made for EP and RD

## 2018-12-17 ENCOUNTER — Other Ambulatory Visit: Payer: Self-pay

## 2018-12-17 ENCOUNTER — Encounter: Payer: Medicare Other | Admitting: *Deleted

## 2018-12-17 DIAGNOSIS — Z951 Presence of aortocoronary bypass graft: Secondary | ICD-10-CM

## 2018-12-17 NOTE — Progress Notes (Signed)
Cardiac Individual Treatment Plan  Patient Details  Name: Andrea [name] MRN: [id number] Date of Birth: 08/30/1945 Referring Provider:     Cardiac Rehab from 12/17/2018 in [facility] Cardiac and Pulmonary Rehab  Referring Provider  Chong Sicilian MD      Initial Encounter Date:    Cardiac Rehab from 12/17/2018 in [facility] Cardiac and Pulmonary Rehab  Date  12/17/18      Visit Diagnosis: S/P CABG x 3  Patient's Home Medications on Admission:  Current Outpatient Medications:  .  albuterol (PROVENTIL HFA;VENTOLIN HFA) 108 (90 Base) MCG/ACT inhaler, Inhale 2 puffs into the lungs every 6 (six) hours as needed for wheezing or shortness of breath., Disp: 1 Inhaler, Rfl: 0 .  amLODipine (NORVASC) 10 MG tablet, , Disp: , Rfl:  .  aspirin EC 81 MG tablet, Take 81 mg by mouth daily., Disp: , Rfl:  .  atorvastatin (LIPITOR) 40 MG tablet, Take 40 mg by mouth at bedtime., Disp: , Rfl: 3 .  benzonatate (TESSALON) 100 MG capsule, Take 1 capsule (100 mg total) by mouth 3 (three) times daily. (Patient not taking: Reported on 12/10/2018), Disp: 20 capsule, Rfl: 0 .  budesonide-formoterol (SYMBICORT) 160-4.5 MCG/ACT inhaler, INHALE TWO PUFFS BY MOUTH TWICE DAILY, Disp: , Rfl:  .  chlorpheniramine-HYDROcodone (TUSSIONEX) 10-8 MG/5ML SUER, Take 5 mLs by mouth at bedtime as needed for cough. (Patient not taking: Reported on 12/10/2018), Disp: 115 mL, Rfl: 0 .  Cholecalciferol (VITAMIN D3) 1000 units CAPS, Take 1 capsule by mouth daily., Disp: , Rfl:  .  ELIQUIS 5 MG TABS tablet, , Disp: , Rfl:  .  fluticasone (FLONASE) 50 MCG/ACT nasal spray, 1 spray by Each Nare route daily., Disp: , Rfl:  .  furosemide (LASIX) 20 MG tablet, , Disp: , Rfl:  .  glipiZIDE (GLUCOTROL) 10 MG tablet, TAKE ONE TABLET BY MOUTH TWICE DAILY TAKE  30  MINUTES  BEFORE  A  MEAL*, Disp: , Rfl:  .  JANUVIA 50 MG tablet, , Disp: , Rfl:  .  metoprolol succinate (TOPROL-XL) 25 MG 24 hr tablet, TAKE ONE TABLET BY MOUTH ONCE DAILY, Disp: , Rfl:  .   Multiple Vitamin (MULTI-VITAMINS) TABS, Take 1 tablet by mouth daily., Disp: , Rfl:  .  nitroGLYCERIN (NITROSTAT) 0.4 MG SL tablet, PLACE 1 TABLET UNDER THE TONGUE EVERY 5 MINUTES AS NEEDED FOR CHEST PAIN, Disp: , Rfl:  .  omeprazole (PRILOSEC) 20 MG capsule, Take 1 capsule by mouth daily., Disp: , Rfl:  .  predniSONE (DELTASONE) 10 MG tablet, 4 tabs po daily for two days (Patient not taking: Reported on 12/10/2018), Disp: 8 tablet, Rfl: 0 .  sitaGLIPtin (JANUVIA) 50 MG tablet, Take 50 mg by mouth., Disp: , Rfl:  .  tiotropium (SPIRIVA HANDIHALER) 18 MCG inhalation capsule, INHALE ONE DOSE BY MOUTH ONCE DAILY, Disp: , Rfl:   Past Medical History: Past Medical History:  Diagnosis Date  . COPD (chronic obstructive pulmonary disease) ([place])   . Diabetes mellitus without complication ([place])   . Heart attack ([place])   . Renal disorder     Tobacco Use: Social History   Tobacco Use  Smoking Status Never Smoker  Smokeless Tobacco Never Used    Labs: Recent Review Flowsheet Data    Labs for ITP Cardiac and Pulmonary Rehab Latest Ref Rng & Units 09/21/2017 11/16/2017   Hemoglobin A1c 4.8 - 5.6 % 7.2(H) 7.3(H)       Exercise Target Goals: Exercise Program Goal: Individual exercise prescription set using  results from initial 6 min walk test and THRR while considering  patient's activity barriers and safety.   Exercise Prescription Goal: Initial exercise prescription builds to 30-45 minutes a day of aerobic activity, 2-3 days per week.  Home exercise guidelines will be given to patient during program as part of exercise prescription that the participant will acknowledge.  Activity Barriers & Risk Stratification: Activity Barriers & Cardiac Risk Stratification - 12/17/18 1006      Activity Barriers & Cardiac Risk Stratification   Activity Barriers  Balance Concerns;History of Falls;Assistive Device;Other (comment);Deconditioning;Muscular Weakness    Comments  Currently wearing brace for foot  after concentator fell on it, using walker    Cardiac Risk Stratification  High       6 Minute Walk:   Oxygen Initial Assessment: Oxygen Initial Assessment - 12/17/18 1009      Home Oxygen   Home Oxygen Device  Home Concentrator;E-Tanks;Portable Concentrator    Sleep Oxygen Prescription  Continuous    Liters per minute  2    Home Exercise Oxygen Prescription  Pulsed    Liters per minute  2    Home at Rest Exercise Oxygen Prescription  None    Compliance with Home Oxygen Use  Yes      Program Oxygen Prescription   Program Oxygen Prescription  Portable Concentrator;Pulsed    Liters per minute  2      Intervention   Short Term Goals  To learn and exhibit compliance with exercise, home and travel O2 prescription;To learn and understand importance of monitoring SPO2 with pulse oximeter and demonstrate accurate use of the pulse oximeter.;To learn and understand importance of maintaining oxygen saturations>88%;To learn and demonstrate proper pursed lip breathing techniques or other breathing techniques.;To learn and demonstrate proper use of respiratory medications    Long  Term Goals  Exhibits compliance with exercise, home and travel O2 prescription;Verbalizes importance of monitoring SPO2 with pulse oximeter and return demonstration;Maintenance of O2 saturations>88%;Compliance with respiratory medication;Exhibits proper breathing techniques, such as pursed lip breathing or other method taught during program session;Demonstrates proper use of MDI's       Oxygen Re-Evaluation:   Oxygen Discharge (Final Oxygen Re-Evaluation):   Initial Exercise Prescription: Initial Exercise Prescription - 12/17/18 1000      Date of Initial Exercise RX and Referring Provider   Date  12/17/18    Referring Provider  Chong Sicilian MD      Oxygen   Oxygen  Intermittent    Liters  2      Track   Minutes  15      Prescription Details   Frequency (times per week)  3    Duration  Progress to 30  minutes of continuous aerobic without signs/symptoms of physical distress      Intensity   THRR 40-80% of Max Heartrate  111-136    Ratings of Perceived Exertion  11-13    Perceived Dyspnea  0-4      Progression   Progression  Continue to progress workloads to maintain intensity without signs/symptoms of physical distress.      Resistance Training   Training Prescription  Yes    Weight  ROM/body weight    Reps  10-15       Perform Capillary Blood Glucose checks as needed.  Exercise Prescription Changes:   Exercise Comments: Exercise Comments    Row Name 12/17/18 1016           Exercise Comments  Pt would like to work  towards coming off her oxygen          Exercise Goals and Review: Exercise Goals    Row Name 12/17/18 1014             Exercise Goals   Increase Physical Activity  Yes       Intervention  Provide advice, education, support and counseling about physical activity/exercise needs.;Develop an individualized exercise prescription for aerobic and resistive training based on initial evaluation findings, risk stratification, comorbidities and participant's personal goals.       Expected Outcomes  Long Term: Add in home exercise to make exercise part of routine and to increase amount of physical activity.;Short Term: Attend rehab on a regular basis to increase amount of physical activity.;Long Term: Exercising regularly at least 3-5 days a week.       Increase Strength and Stamina  Yes       Intervention  Provide advice, education, support and counseling about physical activity/exercise needs.;Develop an individualized exercise prescription for aerobic and resistive training based on initial evaluation findings, risk stratification, comorbidities and participant's personal goals.       Expected Outcomes  Short Term: Increase workloads from initial exercise prescription for resistance, speed, and METs.;Short Term: Perform resistance training exercises routinely during  rehab and add in resistance training at home;Long Term: Improve cardiorespiratory fitness, muscular endurance and strength as measured by increased METs and functional capacity (6MWT)       Able to understand and use rate of perceived exertion (RPE) scale  Yes       Intervention  Provide education and explanation on how to use RPE scale       Expected Outcomes  Short Term: Able to use RPE daily in rehab to express subjective intensity level;Long Term:  Able to use RPE to guide intensity level when exercising independently       Able to understand and use Dyspnea scale  Yes       Intervention  Provide education and explanation on how to use Dyspnea scale       Expected Outcomes  Short Term: Able to use Dyspnea scale daily in rehab to express subjective sense of shortness of breath during exertion;Long Term: Able to use Dyspnea scale to guide intensity level when exercising independently       Knowledge and understanding of Target Heart Rate Range (THRR)  Yes       Intervention  Provide education and explanation of THRR including how the numbers were predicted and where they are located for reference       Expected Outcomes  Short Term: Able to state/look up THRR;Short Term: Able to use daily as guideline for intensity in rehab;Long Term: Able to use THRR to govern intensity when exercising independently       Able to check pulse independently  Yes       Intervention  Provide education and demonstration on how to check pulse in carotid and radial arteries.;Review the importance of being able to check your own pulse for safety during independent exercise       Expected Outcomes  Short Term: Able to explain why pulse checking is important during independent exercise;Long Term: Able to check pulse independently and accurately       Understanding of Exercise Prescription  Yes       Intervention  Provide education, explanation, and written materials on patient's individual exercise prescription        Expected Outcomes  Short Term: Able to explain program  exercise prescription;Long Term: Able to explain home exercise prescription to exercise independently          Exercise Goals Re-Evaluation :   Discharge Exercise Prescription (Final Exercise Prescription Changes):   Nutrition:  Target Goals: Understanding of nutrition guidelines, daily intake of sodium <1535m, cholesterol <2071m calories 30% from fat and 7% or less from saturated fats, daily to have 5 or more servings of fruits and vegetables.  Biometrics:    Nutrition Therapy Plan and Nutrition Goals:   Nutrition Assessments:   Nutrition Goals Re-Evaluation:   Nutrition Goals Discharge (Final Nutrition Goals Re-Evaluation):   Psychosocial: Target Goals: Acknowledge presence or absence of significant depression and/or stress, maximize coping skills, provide positive support system. Participant is able to verbalize types and ability to use techniques and skills needed for reducing stress and depression.   Initial Review & Psychosocial Screening:   Quality of Life Scores:   Scores of 19 and below usually indicate a poorer quality of life in these areas.  A difference of  2-3 points is a clinically meaningful difference.  A difference of 2-3 points in the total score of the Quality of Life Index has been associated with significant improvement in overall quality of life, self-image, physical symptoms, and general health in studies assessing change in quality of life.  PHQ-9: Recent Review Flowsheet Data    There is no flowsheet data to display.     Interpretation of Total Score  Total Score Depression Severity:  1-4 = Minimal depression, 5-9 = Mild depression, 10-14 = Moderate depression, 15-19 = Moderately severe depression, 20-27 = Severe depression   Psychosocial Evaluation and Intervention:   Psychosocial Re-Evaluation:   Psychosocial Discharge (Final Psychosocial Re-Evaluation):   Vocational  Rehabilitation: Provide vocational rehab assistance to qualifying candidates.   Vocational Rehab Evaluation & Intervention:   Education: Education Goals: Education classes will be provided on a variety of topics geared toward better understanding of heart health and risk factor modification. Participant will state understanding/return demonstration of topics presented as noted by education test scores.  Learning Barriers/Preferences:   Education Topics:  AED/CPR: - Group verbal and written instruction with the use of models to demonstrate the basic use of the AED with the basic ABC's of resuscitation.   General Nutrition Guidelines/Fats and Fiber: -Group instruction provided by verbal, written material, models and posters to present the general guidelines for heart healthy nutrition. Gives an explanation and review of dietary fats and fiber.   Controlling Sodium/Reading Food Labels: -Group verbal and written material supporting the discussion of sodium use in heart healthy nutrition. Review and explanation with models, verbal and written materials for utilization of the food label.   Exercise Physiology & General Exercise Guidelines: - Group verbal and written instruction with models to review the exercise physiology of the cardiovascular system and associated critical values. Provides general exercise guidelines with specific guidelines to those with heart or lung disease.    Aerobic Exercise & Resistance Training: - Gives group verbal and written instruction on the various components of exercise. Focuses on aerobic and resistive training programs and the benefits of this training and how to safely progress through these programs..   Flexibility, Balance, Mind/Body Relaxation: Provides group verbal/written instruction on the benefits of flexibility and balance training, including mind/body exercise modes such as yoga, pilates and tai chi.  Demonstration and skill practice  provided.   Stress and Anxiety: - Provides group verbal and written instruction about the health risks of elevated stress and causes  of high stress.  Discuss the correlation between heart/lung disease and anxiety and treatment options. Review healthy ways to manage with stress and anxiety.   Depression: - Provides group verbal and written instruction on the correlation between heart/lung disease and depressed mood, treatment options, and the stigmas associated with seeking treatment.   Anatomy & Physiology of the Heart: - Group verbal and written instruction and models provide basic cardiac anatomy and physiology, with the coronary electrical and arterial systems. Review of Valvular disease and Heart Failure   Cardiac Procedures: - Group verbal and written instruction to review commonly prescribed medications for heart disease. Reviews the medication, class of the drug, and side effects. Includes the steps to properly store meds and maintain the prescription regimen. (beta blockers and nitrates)   Cardiac Medications I: - Group verbal and written instruction to review commonly prescribed medications for heart disease. Reviews the medication, class of the drug, and side effects. Includes the steps to properly store meds and maintain the prescription regimen.   Cardiac Medications II: -Group verbal and written instruction to review commonly prescribed medications for heart disease. Reviews the medication, class of the drug, and side effects. (all other drug classes)    Go Sex-Intimacy & Heart Disease, Get SMART - Goal Setting: - Group verbal and written instruction through game format to discuss heart disease and the return to sexual intimacy. Provides group verbal and written material to discuss and apply goal setting through the application of the S.M.A.R.T. Method.   Other Matters of the Heart: - Provides group verbal, written materials and models to describe Stable Angina and  Peripheral Artery. Includes description of the disease process and treatment options available to the cardiac patient.   Exercise & Equipment Safety: - Individual verbal instruction and demonstration of equipment use and safety with use of the equipment.   Infection Prevention: - Provides verbal and written material to individual with discussion of infection control including proper hand washing and proper equipment cleaning during exercise session.   Falls Prevention: - Provides verbal and written material to individual with discussion of falls prevention and safety.   Diabetes: - Individual verbal and written instruction to review signs/symptoms of diabetes, desired ranges of glucose level fasting, after meals and with exercise. Acknowledge that pre and post exercise glucose checks will be done for 3 sessions at entry of program.   Know Your Numbers and Risk Factors: -Group verbal and written instruction about important numbers in your health.  Discussion of what are risk factors and how they play a role in the disease process.  Review of Cholesterol, Blood Pressure, Diabetes, and BMI and the role they play in your overall health.   Sleep Hygiene: -Provides group verbal and written instruction about how sleep can affect your health.  Define sleep hygiene, discuss sleep cycles and impact of sleep habits. Review good sleep hygiene tips.    Other: -Provides group and verbal instruction on various topics (see comments)   Knowledge Questionnaire Score:   Core Components/Risk Factors/Patient Goals at Admission: Personal Goals and Risk Factors at Admission - 12/17/18 1009      Core Components/Risk Factors/Patient Goals on Admission    Weight Management  Yes;Weight Loss    Intervention  Weight Management: Develop a combined nutrition and exercise program designed to reach desired caloric intake, while maintaining appropriate intake of nutrient and fiber, sodium and fats, and  appropriate energy expenditure required for the weight goal.;Weight Management: Provide education and appropriate resources to help participant work  on and attain dietary goals.    Expected Outcomes  Short Term: Continue to assess and modify interventions until short term weight is achieved;Long Term: Adherence to nutrition and physical activity/exercise program aimed toward attainment of established weight goal;Weight Loss: Understanding of general recommendations for a balanced deficit meal plan, which promotes 1-2 lb weight loss per week and includes a negative energy balance of [id number] kcal/d;Understanding recommendations for meals to include 15-35% energy as protein, 25-35% energy from fat, 35-60% energy from carbohydrates, less than 271m of dietary cholesterol, 20-35 gm of total fiber daily;Understanding of distribution of calorie intake throughout the day with the consumption of 4-5 meals/snacks    Diabetes  Yes    Intervention  Provide education about signs/symptoms and action to take for hypo/hyperglycemia.;Provide education about proper nutrition, including hydration, and aerobic/resistive exercise prescription along with prescribed medications to achieve blood glucose in normal ranges: Fasting glucose 65-99 mg/dL    Expected Outcomes  Short Term: Participant verbalizes understanding of the signs/symptoms and immediate care of hyper/hypoglycemia, proper foot care and importance of medication, aerobic/resistive exercise and nutrition plan for blood glucose control.;Long Term: Attainment of HbA1C < 7%.    Hypertension  Yes    Intervention  Provide education on lifestyle modifcations including regular physical activity/exercise, weight management, moderate sodium restriction and increased consumption of fresh fruit, vegetables, and low fat dairy, alcohol moderation, and smoking cessation.;Monitor prescription use compliance.    Expected Outcomes  Short Term: Continued assessment and intervention  until BP is < 140/931mHG in hypertensive participants. < 130/8017mG in hypertensive participants with diabetes, heart failure or chronic kidney disease.;Long Term: Maintenance of blood pressure at goal levels.    Lipids  Yes    Intervention  Provide education and support for participant on nutrition & aerobic/resistive exercise along with prescribed medications to achieve LDL <93m28mDL >40mg62m Expected Outcomes  Long Term: Cholesterol controlled with medications as prescribed, with individualized exercise RX and with personalized nutrition plan. Value goals: LDL < 93mg,61m > 40 mg.;Short Term: Participant states understanding of desired cholesterol values and is compliant with medications prescribed. Participant is following exercise prescription and nutrition guidelines.       Core Components/Risk Factors/Patient Goals Review:    Core Components/Risk Factors/Patient Goals at Discharge (Final Review):    ITP Comments: ITP Comments    Row Name 12/10/18 1057 12/17/18 1006         ITP Comments  Initial Visit for [employer] today. Consent and intake completed. Appt made for EP and RD .  Completed initial ExRx created and sent to Dr. Mark MEmily Filbertcal Director to review and sign.         Comments: Initial Virtual ExRx

## 2018-12-17 NOTE — Progress Notes (Signed)
Starting out your exercise session should last for 15 to 30 minutes for days a week.    Your progression for home exercise is:  **Please wear your oxygen when you exercise**  Start at 15 min 3 days a week consistently. Then begin to add a minute each day until you can do 30 min 3 days a week.  Continue at this level for 1-2 weeks then begin to add in your 4th and 5th days.  Another alternative will be to start with one round of videos (start with chair routine/low intensity) and then add in the second round to reach your 30 min.  You can also combine the videos with your walking too.   Resistance Training: Start with 6-8 exercises for 12 reps each or follow along with a video. Exercises can be found in packet that will be mailed to you.  You can add in your dumbbells once you feel ready.   Exercise at a comfortable pace, using your heart rate and rate of perceived exertion as guides for intensity.  Your target heart rate range is 111-136.

## 2018-12-23 ENCOUNTER — Other Ambulatory Visit: Payer: Self-pay

## 2018-12-23 ENCOUNTER — Emergency Department: Payer: Medicare Other

## 2018-12-23 ENCOUNTER — Emergency Department
Admission: EM | Admit: 2018-12-23 | Discharge: 2018-12-23 | Disposition: A | Discharge status: Home / Self Care | Payer: Medicare Other | Attending: Emergency Medicine | Admitting: Emergency Medicine

## 2018-12-23 ENCOUNTER — Encounter: Payer: Self-pay | Admitting: Emergency Medicine

## 2018-12-23 DIAGNOSIS — Z79899 Other long term (current) drug therapy: Secondary | ICD-10-CM | POA: Insufficient documentation

## 2018-12-23 DIAGNOSIS — Z7984 Long term (current) use of oral hypoglycemic drugs: Secondary | ICD-10-CM | POA: Insufficient documentation

## 2018-12-23 DIAGNOSIS — E119 Type 2 diabetes mellitus without complications: Secondary | ICD-10-CM | POA: Insufficient documentation

## 2018-12-23 DIAGNOSIS — I493 Ventricular premature depolarization: Secondary | ICD-10-CM | POA: Insufficient documentation

## 2018-12-23 DIAGNOSIS — Z951 Presence of aortocoronary bypass graft: Secondary | ICD-10-CM | POA: Insufficient documentation

## 2018-12-23 DIAGNOSIS — Z7901 Long term (current) use of anticoagulants: Secondary | ICD-10-CM | POA: Insufficient documentation

## 2018-12-23 DIAGNOSIS — Z7982 Long term (current) use of aspirin: Secondary | ICD-10-CM | POA: Insufficient documentation

## 2018-12-23 DIAGNOSIS — R002 Palpitations: Secondary | ICD-10-CM | POA: Diagnosis present

## 2018-12-23 DIAGNOSIS — I252 Old myocardial infarction: Secondary | ICD-10-CM | POA: Insufficient documentation

## 2018-12-23 DIAGNOSIS — J449 Chronic obstructive pulmonary disease, unspecified: Secondary | ICD-10-CM | POA: Insufficient documentation

## 2018-12-23 LAB — CBC
HCT: 33.1 % — ABNORMAL LOW (ref 36.0–46.0)
Hemoglobin: 10.5 g/dL — ABNORMAL LOW (ref 12.0–15.0)
MCH: 29 pg (ref 26.0–34.0)
MCHC: 31.7 g/dL (ref 30.0–36.0)
MCV: 91.4 fL (ref 80.0–100.0)
Platelets: 180 10*3/uL (ref 150–400)
RBC: 3.62 MIL/uL — ABNORMAL LOW (ref 3.87–5.11)
RDW: 14.1 % (ref 11.5–15.5)
WBC: 7.2 10*3/uL (ref 4.0–10.5)
nRBC: 0 % (ref 0.0–0.2)

## 2018-12-23 LAB — BASIC METABOLIC PANEL
Anion gap: 9 (ref 5–15)
BUN: 37 mg/dL — ABNORMAL HIGH (ref 8–23)
CO2: 28 mmol/L (ref 22–32)
Calcium: 9.3 mg/dL (ref 8.9–10.3)
Chloride: 102 mmol/L (ref 98–111)
Creatinine, Ser: 1.78 mg/dL — ABNORMAL HIGH (ref 0.44–1.00)
GFR calc Af Amer: 32 mL/min — ABNORMAL LOW (ref >60)
GFR calc non Af Amer: 28 mL/min — ABNORMAL LOW (ref >60)
Glucose, Bld: 168 mg/dL — ABNORMAL HIGH (ref 70–99)
Potassium: 4.4 mmol/L (ref 3.5–5.1)
Sodium: 139 mmol/L (ref 135–145)

## 2018-12-23 LAB — TROPONIN I (HIGH SENSITIVITY)
Troponin I (High Sensitivity): 12 ng/L (ref <18)
Troponin I (High Sensitivity): 11 ng/L (ref <18)

## 2018-12-23 NOTE — ED Notes (Signed)
Pt in ambulance, unable to sign due to no signature pad. PT in NAD, verbalizes d/c understanding and follow up

## 2018-12-23 NOTE — ED Notes (Signed)
ED Provider at bedside. 

## 2018-12-23 NOTE — ED Provider Notes (Signed)
[facility] Emergency Department Provider Note    First MD Initiated Contact with Patient 12/23/18 1130     (approximate)  I have reviewed the triage vital signs and the nursing notes.   HISTORY  Chief Complaint Palpitations    HPI Andrea [name] is a 73 y.o. female with below list of previous medical conditions including COPD diabetes and previous MI status post CABG presents to the emergency department with a history of irregular heartbeat which lasted approximately 1 hour this morning.  Patient states that she was drinking coffee at the time and not performing any strenuous activity when she noticed that her heartbeat was irregular.  Patient denies it being fast but states that it just felt really "all over the place".  Patient denies any chest pain no shortness of breath no dizziness.  Patient denies any nausea or vomiting or diaphoresis.  Patient states that she had 1 previous episode of atrial fibrillation following her CABG however she has not had any issues since.  Patient denies any complaints at present.       Past Medical History:  Diagnosis Date  . COPD (chronic obstructive pulmonary disease) ([place])   . Diabetes mellitus without complication ([place])   . Heart attack ([place])   . Renal disorder     Patient Active Problem List   Diagnosis Date Noted  . Acute bronchitis with chronic obstructive pulmonary disease (COPD) ([place]) 11/15/2017  . Pneumonia 09/22/2017    Past Surgical History:  Procedure Laterality Date  . ABDOMINAL HYSTERECTOMY    . CARDIAC SURGERY     stents    Prior to Admission medications   Medication Sig Start Date End Date Taking? Authorizing Provider  albuterol (PROVENTIL HFA;VENTOLIN HFA) 108 (90 Base) MCG/ACT inhaler Inhale 2 puffs into the lungs every 6 (six) hours as needed for wheezing or shortness of breath. 11/18/17   Loletha Grayer, MD  amLODipine (NORVASC) 10 MG tablet  09/12/18   [provider]  aspirin  EC 81 MG tablet Take 81 mg by mouth daily.    [provider]  atorvastatin (LIPITOR) 40 MG tablet Take 40 mg by mouth at bedtime. 10/09/17   [provider]  benzonatate (TESSALON) 100 MG capsule Take 1 capsule (100 mg total) by mouth 3 (three) times daily. Patient not taking: Reported on 12/10/2018 09/26/17   Loletha Grayer, MD  budesonide-formoterol [facility]) 160-4.5 MCG/ACT inhaler INHALE TWO PUFFS BY MOUTH TWICE DAILY 06/28/16   [provider]  chlorpheniramine-HYDROcodone (TUSSIONEX) 10-8 MG/5ML SUER Take 5 mLs by mouth at bedtime as needed for cough. Patient not taking: Reported on 12/10/2018 09/26/17   Loletha Grayer, MD  Cholecalciferol (VITAMIN D3) 1000 units CAPS Take 1 capsule by mouth daily.    [provider]  ELIQUIS 5 MG TABS tablet  11/13/18   [provider]  fluticasone (FLONASE) 50 MCG/ACT nasal spray 1 spray by Each Nare route daily. 01/03/17 01/03/18  [provider]  furosemide (LASIX) 20 MG tablet  12/04/18   [provider]  glipiZIDE (GLUCOTROL) 10 MG tablet TAKE ONE TABLET BY MOUTH TWICE DAILY TAKE  30  MINUTES  BEFORE  A  MEAL* 03/19/17   [provider]  JANUVIA 50 MG tablet  11/20/18   [provider]  metoprolol succinate (TOPROL-XL) 25 MG 24 hr tablet TAKE ONE TABLET BY MOUTH ONCE DAILY 11/15/16   [provider]  Multiple Vitamin (MULTI-VITAMINS) TABS Take 1 tablet by mouth daily.  [provider]  nitroGLYCERIN (NITROSTAT) 0.4 MG SL tablet PLACE 1 TABLET UNDER THE TONGUE EVERY 5 MINUTES AS NEEDED FOR CHEST PAIN 01/19/17   [provider]  omeprazole (PRILOSEC) 20 MG capsule Take 1 capsule by mouth daily. 07/04/17   [provider]  predniSONE (DELTASONE) 10 MG tablet 4 tabs po daily for two days Patient not taking: Reported on 12/10/2018 11/18/17   Loletha Grayer, MD  sitaGLIPtin (JANUVIA) 50 MG tablet Take 50 mg by mouth. 07/31/17 07/31/18  [provider]  tiotropium (SPIRIVA HANDIHALER) 18 MCG inhalation capsule INHALE ONE DOSE BY MOUTH ONCE DAILY 08/01/17   [provider]    Allergies Metformin and related and Sulfur  No family history on file.  Social History Social History   Tobacco Use  . Smoking status: Never Smoker  . Smokeless tobacco: Never Used  Substance Use Topics  . Alcohol use: No  . Drug use: No    Review of Systems Constitutional: No fever/chills Eyes: No visual changes. ENT: No sore throat. Cardiovascular: Denies chest pain.  Positive for irregular heartbeat (resolved) Respiratory: Denies shortness of breath. Gastrointestinal: No abdominal pain.  No nausea, no vomiting.  No diarrhea.  No constipation. Genitourinary: Negative for dysuria. Musculoskeletal: Negative for neck pain.  Negative for back pain. Integumentary: Negative for rash. Neurological: Negative for headaches, focal weakness or numbness.   ____________________________________________   PHYSICAL EXAM:  VITAL SIGNS: ED Triage Vitals [12/23/18 1107]  Enc Vitals Group     BP (!) 139/49     Pulse Rate 73     Resp 18     Temp 98.7 F (37.1 C)     Temp Source Oral     SpO2 100 %     Weight 68 kg (150 lb)     Height 1.594 m (5' 2.75")     Head Circumference      Peak Flow      Pain Score 0     Pain Loc      Pain Edu?      Excl. in [place]?     Constitutional: Alert and oriented. Well appearing and in no acute distress. Eyes: Conjunctivae are normal. Mouth/Throat: Mucous membranes are moist  Oropharynx non-erythematous. Neck: No stridor.   Cardiovascular: Normal rate, regular rhythm. Good peripheral circulation. Grossly normal heart sounds. Respiratory: Normal respiratory effort.  No retractions. No audible wheezing. Gastrointestinal: Soft and nontender. No distention.  Musculoskeletal: No lower extremity tenderness nor edema. No gross deformities of extremities. Neurologic:  Normal speech and language. No gross focal  neurologic deficits are appreciated.  Skin:  Skin is warm, dry and intact. No rash noted. Psychiatric: Mood and affect are normal. Speech and behavior are normal.  ____________________________________________   LABS (all labs ordered are listed, but only abnormal results are displayed)  Labs Reviewed  BASIC METABOLIC PANEL - Abnormal; Notable for the following components:      Result Value   Glucose, Bld 168 (*)    BUN 37 (*)    Creatinine, Ser 1.78 (*)    GFR calc non Af Amer 28 (*)    GFR calc Af Amer 32 (*)    All other components within normal limits  CBC - Abnormal; Notable for the following components:   RBC 3.62 (*)    Hemoglobin 10.5 (*)    HCT 33.1 (*)    All other components within normal limits  TROPONIN I (HIGH SENSITIVITY)  TROPONIN I (HIGH SENSITIVITY)   ____________________________________________  EKG  ED ECG REPORT I, [place] N BROWN, the attending physician, personally viewed and interpreted this ECG.   Date: 12/23/2018  EKG Time: 11:06 AM  Rate: 78  Rhythm: Normal sinus rhythm with a premature ventricular contraction.  Axis: Rightward axis deviation  Intervals:Normal  ST&T Change: None  ____________________________________________  RADIOLOGY I, [place] N BROWN, personally viewed and evaluated these images (plain radiographs) as part of my medical decision making, as well as reviewing the written report by the radiologist.  ED MD interpretation: Normal chest x-ray  Official radiology report(s): Dg Chest 2 View  Result Date: 12/23/2018 CLINICAL DATA:  Palpitations. EXAM: CHEST - 2 VIEW COMPARISON:  No prior. FINDINGS: Surgical clips noted over the right chest. Mediastinum and hilar structures normal. Lungs are clear. No pleural effusion pneumothorax. Prior CABG. Surgical clips over the right chest. Heart size stable. IMPRESSION: Prior CABG. Heart size stable. No acute cardiopulmonary disease. Chest is stable from prior exam. Electronically Signed    By: Marcello Moores  Register   On: 12/23/2018 11:49      Procedures   ____________________________________________   INITIAL IMPRESSION / MDM / [place] / ED COURSE  As part of my medical decision making, I reviewed the following data within the electronic MEDICAL RECORD NUMBER   73 year old female presented with above-stated history and physical exam secondary to palpitations.  Patient without any complaint during entire ED stay.  EKG revealed evidence of premature ventricular contraction.  During evaluation patient had isolated episodes of PVCs.  I encouraged patient to follow-up with her primary care provider for further outpatient evaluation including possible Holter monitor given the possibility of additional arrhythmia other than the PVC that was noted in the emergency department.  Patient has no complaints at present.  *Andrea [name] was evaluated in Emergency Department on 12/23/2018 for the symptoms described in the history of present illness. She was evaluated in the context of the global COVID-19 pandemic, which necessitated consideration that the patient might be at risk for infection with the SARS-CoV-2 virus that causes COVID-19. Institutional protocols and algorithms that pertain to the evaluation of patients at risk for COVID-19 are in a state of rapid change based on information released by regulatory bodies including the CDC and federal and state organizations. These policies and algorithms were followed during the patient's care in the ED.  Some ED evaluations and interventions may be delayed as a result of limited staffing during the pandemic.*    ____________________________________________  FINAL CLINICAL IMPRESSION(S) / ED DIAGNOSES  Final diagnoses:  PVC (premature ventricular contraction)     MEDICATIONS GIVEN DURING THIS VISIT:  Medications - No data to display   ED Discharge Orders    None       Note:  This document was prepared using Dragon voice  recognition software and may include unintentional dictation errors.   Gregor Hams, MD 12/23/18 [phone]

## 2018-12-23 NOTE — ED Notes (Signed)
Pt ambulatory to restroom with NAD noted.

## 2018-12-23 NOTE — ED Triage Notes (Signed)
Pt arrives via ems from home with concerns over palpitations that started this morning lasting for 1 hour. Pt in NSR for ems. Vitals WDL for ems. Pt took 1 asa prior to calling ems

## 2018-12-25 ENCOUNTER — Encounter: Payer: Self-pay | Admitting: *Deleted

## 2018-12-25 DIAGNOSIS — Z951 Presence of aortocoronary bypass graft: Secondary | ICD-10-CM

## 2019-01-07 ENCOUNTER — Other Ambulatory Visit: Payer: Self-pay

## 2019-01-07 ENCOUNTER — Encounter: Payer: Medicare Other | Attending: Cardiology | Admitting: *Deleted

## 2019-01-07 DIAGNOSIS — Z951 Presence of aortocoronary bypass graft: Secondary | ICD-10-CM

## 2019-01-07 NOTE — Progress Notes (Signed)
Andrea [name] is doing better overall.  She presented to the ED at the beginning of the month for DOE and SOB.  She was noted to have PVCs and was placed ona 14 day event monitor.  She has not had any repeat episodes, but did note a few days of "increased weakness".  She would like to have her follow up appointment with cardiology on 8/3 prior to coming into rehab.  RN Orient scheduled 7/27 and 6MWT/gym orient 8/4.  Estefany has been exercising some.  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  She aims for at least 30 min every day!!  Overall, she is feeling good and eager to start rehab in the gym and get back on a treadmill again.    She is feeling good at home.  She is worried about her younger daughter not wearing a mask.  Her oldest is a Marine scientist and has tested positive for COVID already.  She is worried for her family, but coping the best she can.  Her weight and blood pressures have been good and she has not noted any problems.

## 2019-01-20 ENCOUNTER — Other Ambulatory Visit: Payer: Self-pay

## 2019-01-20 ENCOUNTER — Encounter: Payer: Medicare Other | Admitting: *Deleted

## 2019-01-20 DIAGNOSIS — Z951 Presence of aortocoronary bypass graft: Secondary | ICD-10-CM

## 2019-01-20 NOTE — Progress Notes (Signed)
RN Virtual orientation completed. Diagnosis can be found in CE 2/18. EP/RD scheduled for 8/4

## 2019-01-28 ENCOUNTER — Other Ambulatory Visit: Payer: Self-pay

## 2019-01-28 ENCOUNTER — Encounter: Payer: Medicare Other | Attending: Cardiology

## 2019-01-28 DIAGNOSIS — N289 Disorder of kidney and ureter, unspecified: Secondary | ICD-10-CM | POA: Diagnosis not present

## 2019-01-28 DIAGNOSIS — E118 Type 2 diabetes mellitus with unspecified complications: Secondary | ICD-10-CM | POA: Insufficient documentation

## 2019-01-28 DIAGNOSIS — Z7982 Long term (current) use of aspirin: Secondary | ICD-10-CM | POA: Insufficient documentation

## 2019-01-28 DIAGNOSIS — Z79899 Other long term (current) drug therapy: Secondary | ICD-10-CM | POA: Diagnosis not present

## 2019-01-28 DIAGNOSIS — Z951 Presence of aortocoronary bypass graft: Secondary | ICD-10-CM | POA: Diagnosis present

## 2019-01-28 DIAGNOSIS — I252 Old myocardial infarction: Secondary | ICD-10-CM | POA: Insufficient documentation

## 2019-01-28 DIAGNOSIS — Z7901 Long term (current) use of anticoagulants: Secondary | ICD-10-CM | POA: Diagnosis not present

## 2019-01-28 DIAGNOSIS — J449 Chronic obstructive pulmonary disease, unspecified: Secondary | ICD-10-CM | POA: Diagnosis not present

## 2019-01-28 NOTE — Patient Instructions (Signed)
Patient Instructions  Patient Details  Name: Andrea [name] MRN: [id number] Date of Birth: September 19, 1945 Referring Provider:  Levonne Spiller, MD  Below are your personal goals for exercise, nutrition, and risk factors. Our goal is to help you stay on track towards obtaining and maintaining these goals. We will be discussing your progress on these goals with you throughout the program.  Initial Exercise Prescription: Initial Exercise Prescription - 01/28/19 1600      Date of Initial Exercise RX and Referring Provider   Date  01/28/19    Referring Provider  Chong Sicilian MD      Oxygen   Oxygen  Continuous    Liters  2      Treadmill   MPH  1.3    Grade  0    Minutes  15    METs  1.2      NuStep   Level  2    SPM  60    Minutes  15    METs  2      Arm Ergometer   Level  1    Watts  15    RPM  10    Minutes  15    METs  1.2      Biostep-RELP   Level  2    SPM  50    Minutes  15    METs  2      Prescription Details   Frequency (times per week)  3    Duration  Progress to 30 minutes of continuous aerobic without signs/symptoms of physical distress      Intensity   THRR 40-80% of Max Heartrate  122-139    Ratings of Perceived Exertion  11-15    Perceived Dyspnea  0-4      Progression   Progression  Continue progressive overload as per policy without signs/symptoms or physical distress.      Resistance Training   Weight  3    Reps  10-15       Exercise Goals: Frequency: Be able to perform aerobic exercise two to three times per week in program working toward 2-5 days per week of home exercise.  Intensity: Work with a perceived exertion of 11 (fairly light) - 15 (hard) while following your exercise prescription.  We will make changes to your prescription with you as you progress through the program.   Duration: Be able to do 30 to 45 minutes of continuous aerobic exercise in addition to a 5 minute warm-up and a 5 minute cool-down routine.   Nutrition  Goals: Your personal nutrition goals will be established when you do your nutrition analysis with the dietician.  The following are general nutrition guidelines to follow: Cholesterol < 200mg /day Sodium < 1500mg /day Fiber: Women over 50 yrs - 21 grams per day  Personal Goals: Personal Goals and Risk Factors at Admission - 12/17/18 1009      Core Components/Risk Factors/Patient Goals on Admission    Weight Management  Yes;Weight Loss    Intervention  Weight Management: Develop a combined nutrition and exercise program designed to reach desired caloric intake, while maintaining appropriate intake of nutrient and fiber, sodium and fats, and appropriate energy expenditure required for the weight goal.;Weight Management: Provide education and appropriate resources to help participant work on and attain dietary goals.    Expected Outcomes  Short Term: Continue to assess and modify interventions until short term weight is achieved;Long Term: Adherence to nutrition and physical activity/exercise program aimed  toward attainment of established weight goal;Weight Loss: Understanding of general recommendations for a balanced deficit meal plan, which promotes 1-2 lb weight loss per week and includes a negative energy balance of [phone] kcal/d;Understanding recommendations for meals to include 15-35% energy as protein, 25-35% energy from fat, 35-60% energy from carbohydrates, less than 200mg  of dietary cholesterol, 20-35 gm of total fiber daily;Understanding of distribution of calorie intake throughout the day with the consumption of 4-5 meals/snacks    Diabetes  Yes    Intervention  Provide education about signs/symptoms and action to take for hypo/hyperglycemia.;Provide education about proper nutrition, including hydration, and aerobic/resistive exercise prescription along with prescribed medications to achieve blood glucose in normal ranges: Fasting glucose 65-99 mg/dL    Expected Outcomes  Short Term:  Participant verbalizes understanding of the signs/symptoms and immediate care of hyper/hypoglycemia, proper foot care and importance of medication, aerobic/resistive exercise and nutrition plan for blood glucose control.;Long Term: Attainment of HbA1C < 7%.    Hypertension  Yes    Intervention  Provide education on lifestyle modifcations including regular physical activity/exercise, weight management, moderate sodium restriction and increased consumption of fresh fruit, vegetables, and low fat dairy, alcohol moderation, and smoking cessation.;Monitor prescription use compliance.    Expected Outcomes  Short Term: Continued assessment and intervention until BP is < 140/67mm HG in hypertensive participants. < 130/58mm HG in hypertensive participants with diabetes, heart failure or chronic kidney disease.;Long Term: Maintenance of blood pressure at goal levels.    Lipids  Yes    Intervention  Provide education and support for participant on nutrition & aerobic/resistive exercise along with prescribed medications to achieve LDL 70mg , HDL >40mg .    Expected Outcomes  Long Term: Cholesterol controlled with medications as prescribed, with individualized exercise RX and with personalized nutrition plan. Value goals: LDL < 70mg , HDL > 40 mg.;Short Term: Participant states understanding of desired cholesterol values and is compliant with medications prescribed. Participant is following exercise prescription and nutrition guidelines.       Tobacco Use Initial Evaluation: Social History   Tobacco Use  Smoking Status Never Smoker  Smokeless Tobacco Never Used    Exercise Goals and Review: Exercise Goals    Row Name 12/17/18 1014 01/28/19 1611           Exercise Goals   Increase Physical Activity  Yes  Yes      Intervention  Provide advice, education, support and counseling about physical activity/exercise needs.;Develop an individualized exercise prescription for aerobic and resistive training based on  initial evaluation findings, risk stratification, comorbidities and participant's personal goals.  Provide advice, education, support and counseling about physical activity/exercise needs.;Develop an individualized exercise prescription for aerobic and resistive training based on initial evaluation findings, risk stratification, comorbidities and participant's personal goals.      Expected Outcomes  Long Term: Add in home exercise to make exercise part of routine and to increase amount of physical activity.;Short Term: Attend rehab on a regular basis to increase amount of physical activity.;Long Term: Exercising regularly at least 3-5 days a week.  Long Term: Add in home exercise to make exercise part of routine and to increase amount of physical activity.;Short Term: Attend rehab on a regular basis to increase amount of physical activity.;Long Term: Exercising regularly at least 3-5 days a week.      Increase Strength and Stamina  Yes  Yes      Intervention  Provide advice, education, support and counseling about physical activity/exercise needs.;Develop an individualized exercise prescription for  aerobic and resistive training based on initial evaluation findings, risk stratification, comorbidities and participant's personal goals.  Provide advice, education, support and counseling about physical activity/exercise needs.;Develop an individualized exercise prescription for aerobic and resistive training based on initial evaluation findings, risk stratification, comorbidities and participant's personal goals.      Expected Outcomes  Short Term: Increase workloads from initial exercise prescription for resistance, speed, and METs.;Short Term: Perform resistance training exercises routinely during rehab and add in resistance training at home;Long Term: Improve cardiorespiratory fitness, muscular endurance and strength as measured by increased METs and functional capacity (6MWT)  Short Term: Increase workloads from  initial exercise prescription for resistance, speed, and METs.;Short Term: Perform resistance training exercises routinely during rehab and add in resistance training at home;Long Term: Improve cardiorespiratory fitness, muscular endurance and strength as measured by increased METs and functional capacity (6MWT)      Able to understand and use rate of perceived exertion (RPE) scale  Yes  Yes      Intervention  Provide education and explanation on how to use RPE scale  Provide education and explanation on how to use RPE scale      Expected Outcomes  Short Term: Able to use RPE daily in rehab to express subjective intensity level;Long Term:  Able to use RPE to guide intensity level when exercising independently  Short Term: Able to use RPE daily in rehab to express subjective intensity level;Long Term:  Able to use RPE to guide intensity level when exercising independently      Able to understand and use Dyspnea scale  Yes  Yes      Intervention  Provide education and explanation on how to use Dyspnea scale  Provide education and explanation on how to use Dyspnea scale      Expected Outcomes  Short Term: Able to use Dyspnea scale daily in rehab to express subjective sense of shortness of breath during exertion;Long Term: Able to use Dyspnea scale to guide intensity level when exercising independently  Short Term: Able to use Dyspnea scale daily in rehab to express subjective sense of shortness of breath during exertion;Long Term: Able to use Dyspnea scale to guide intensity level when exercising independently      Knowledge and understanding of Target Heart Rate Range (THRR)  Yes  Yes      Intervention  Provide education and explanation of THRR including how the numbers were predicted and where they are located for reference  Provide education and explanation of THRR including how the numbers were predicted and where they are located for reference      Expected Outcomes  Short Term: Able to state/look up  THRR;Short Term: Able to use daily as guideline for intensity in rehab;Long Term: Able to use THRR to govern intensity when exercising independently  Short Term: Able to state/look up THRR;Short Term: Able to use daily as guideline for intensity in rehab;Long Term: Able to use THRR to govern intensity when exercising independently      Able to check pulse independently  Yes  Yes      Intervention  Provide education and demonstration on how to check pulse in carotid and radial arteries.;Review the importance of being able to check your own pulse for safety during independent exercise  Provide education and demonstration on how to check pulse in carotid and radial arteries.;Review the importance of being able to check your own pulse for safety during independent exercise      Expected Outcomes  Short Term:  Able to explain why pulse checking is important during independent exercise;Long Term: Able to check pulse independently and accurately  Short Term: Able to explain why pulse checking is important during independent exercise;Long Term: Able to check pulse independently and accurately      Understanding of Exercise Prescription  Yes  Yes      Intervention  Provide education, explanation, and written materials on patient's individual exercise prescription  Provide education, explanation, and written materials on patient's individual exercise prescription      Expected Outcomes  Short Term: Able to explain program exercise prescription;Long Term: Able to explain home exercise prescription to exercise independently  Short Term: Able to explain program exercise prescription;Long Term: Able to explain home exercise prescription to exercise independently         Copy of goals given to participant.

## 2019-01-28 NOTE — Progress Notes (Signed)
Cardiac Individual Treatment Plan  Patient Details  Name: JOSEFINE FUHR MRN: [id number] Date of Birth: 1946/05/25 Referring Provider:     Cardiac Rehab from 01/28/2019 in [facility] Cardiac and Pulmonary Rehab  Referring Provider  Chong Sicilian MD      Initial Encounter Date:    Cardiac Rehab from 01/28/2019 in [facility] Cardiac and Pulmonary Rehab  Date  01/28/19      Visit Diagnosis: S/P CABG x 3  Patient's Home Medications on Admission:  Current Outpatient Medications:  .  albuterol (PROVENTIL HFA;VENTOLIN HFA) 108 (90 Base) MCG/ACT inhaler, Inhale 2 puffs into the lungs every 6 (six) hours as needed for wheezing or shortness of breath., Disp: 1 Inhaler, Rfl: 0 .  amLODipine (NORVASC) 10 MG tablet, , Disp: , Rfl:  .  aspirin EC 81 MG tablet, Take 81 mg by mouth daily., Disp: , Rfl:  .  atorvastatin (LIPITOR) 40 MG tablet, Take 40 mg by mouth at bedtime., Disp: , Rfl: 3 .  benzonatate (TESSALON) 100 MG capsule, Take 1 capsule (100 mg total) by mouth 3 (three) times daily. (Patient not taking: Reported on 01/20/2019), Disp: 20 capsule, Rfl: 0 .  budesonide-formoterol (SYMBICORT) 160-4.5 MCG/ACT inhaler, INHALE TWO PUFFS BY MOUTH TWICE DAILY, Disp: , Rfl:  .  chlorpheniramine-HYDROcodone (TUSSIONEX) 10-8 MG/5ML SUER, Take 5 mLs by mouth at bedtime as needed for cough. (Patient not taking: Reported on 12/10/2018), Disp: 115 mL, Rfl: 0 .  Cholecalciferol (VITAMIN D3) 1000 units CAPS, Take 1 capsule by mouth daily., Disp: , Rfl:  .  ELIQUIS 5 MG TABS tablet, , Disp: , Rfl:  .  fluticasone (FLONASE) 50 MCG/ACT nasal spray, 1 spray by Each Nare route daily., Disp: , Rfl:  .  furosemide (LASIX) 20 MG tablet, , Disp: , Rfl:  .  glipiZIDE (GLUCOTROL) 10 MG tablet, TAKE ONE TABLET BY MOUTH TWICE DAILY TAKE  30  MINUTES  BEFORE  A  MEAL*, Disp: , Rfl:  .  JANUVIA 50 MG tablet, , Disp: , Rfl:  .  metoprolol succinate (TOPROL-XL) 25 MG 24 hr tablet, TAKE ONE TABLET BY MOUTH ONCE DAILY, Disp: , Rfl:  .   Multiple Vitamin (MULTI-VITAMINS) TABS, Take 1 tablet by mouth daily., Disp: , Rfl:  .  nitroGLYCERIN (NITROSTAT) 0.4 MG SL tablet, PLACE 1 TABLET UNDER THE TONGUE EVERY 5 MINUTES AS NEEDED FOR CHEST PAIN, Disp: , Rfl:  .  omeprazole (PRILOSEC) 20 MG capsule, Take 1 capsule by mouth daily., Disp: , Rfl:  .  predniSONE (DELTASONE) 10 MG tablet, 4 tabs po daily for two days (Patient not taking: Reported on 12/10/2018), Disp: 8 tablet, Rfl: 0 .  sitaGLIPtin (JANUVIA) 50 MG tablet, Take 50 mg by mouth., Disp: , Rfl:  .  tiotropium (SPIRIVA HANDIHALER) 18 MCG inhalation capsule, INHALE ONE DOSE BY MOUTH ONCE DAILY, Disp: , Rfl:   Past Medical History: Past Medical History:  Diagnosis Date  . COPD (chronic obstructive pulmonary disease) ([place])   . Diabetes mellitus without complication ([place])   . Heart attack ([place])   . Renal disorder     Tobacco Use: Social History   Tobacco Use  Smoking Status Never Smoker  Smokeless Tobacco Never Used    Labs: Recent Review Flowsheet Data    Labs for ITP Cardiac and Pulmonary Rehab Latest Ref Rng & Units 09/21/2017 11/16/2017   Hemoglobin A1c 4.8 - 5.6 % 7.2(H) 7.3(H)       Exercise Target Goals: Exercise Program Goal: Individual exercise prescription set using  results from initial 6 min walk test and THRR while considering  patient's activity barriers and safety.   Exercise Prescription Goal: Initial exercise prescription builds to 30-45 minutes a day of aerobic activity, 2-3 days per week.  Home exercise guidelines will be given to patient during program as part of exercise prescription that the participant will acknowledge.  Activity Barriers & Risk Stratification: Activity Barriers & Cardiac Risk Stratification - 12/17/18 1006      Activity Barriers & Cardiac Risk Stratification   Activity Barriers  Balance Concerns;History of Falls;Assistive Device;Other (comment);Deconditioning;Muscular Weakness    Comments  Currently wearing brace for foot  after concentator fell on it, using walker    Cardiac Risk Stratification  High       6 Minute Walk: 6 Minute Walk    Row Name 01/28/19 1557         6 Minute Walk   Phase  Initial     Distance  660 feet     Walk Time  4.48 minutes     # of Rest Breaks  1     MPH  1.2     METS  2     RPE  11     Perceived Dyspnea   1     VO2 Peak  7     Symptoms  Yes (comment)     Comments  Patient reports taking a break due to being tired and thirsty from wearing a mask.     Resting HR  104 bpm     Resting BP  118/58     Resting Oxygen Saturation   92 %     Exercise Oxygen Saturation  during 6 min walk  97 %     Max Ex. HR  127 bpm     Max Ex. BP  146/70     2 Minute Post BP  120/58        Oxygen Initial Assessment: Oxygen Initial Assessment - 12/17/18 1009      Home Oxygen   Home Oxygen Device  Home Concentrator;E-Tanks;Portable Concentrator    Sleep Oxygen Prescription  Continuous    Liters per minute  2    Home Exercise Oxygen Prescription  Pulsed    Liters per minute  2    Home at Rest Exercise Oxygen Prescription  None    Compliance with Home Oxygen Use  Yes      Program Oxygen Prescription   Program Oxygen Prescription  Portable Concentrator;Pulsed    Liters per minute  2      Intervention   Short Term Goals  To learn and exhibit compliance with exercise, home and travel O2 prescription;To learn and understand importance of monitoring SPO2 with pulse oximeter and demonstrate accurate use of the pulse oximeter.;To learn and understand importance of maintaining oxygen saturations>88%;To learn and demonstrate proper pursed lip breathing techniques or other breathing techniques.;To learn and demonstrate proper use of respiratory medications    Long  Term Goals  Exhibits compliance with exercise, home and travel O2 prescription;Verbalizes importance of monitoring SPO2 with pulse oximeter and return demonstration;Maintenance of O2 saturations>88%;Compliance with respiratory  medication;Exhibits proper breathing techniques, such as pursed lip breathing or other method taught during program session;Demonstrates proper use of MDI's       Oxygen Re-Evaluation:   Oxygen Discharge (Final Oxygen Re-Evaluation):   Initial Exercise Prescription: Initial Exercise Prescription - 01/28/19 1600      Date of Initial Exercise RX and Referring Provider   Date  01/28/19    Referring Provider  Chong Sicilian MD      Oxygen   Oxygen  Continuous    Liters  2      Treadmill   MPH  1.3    Grade  0    Minutes  15    METs  1.2      NuStep   Level  2    SPM  60    Minutes  15    METs  2      Arm Ergometer   Level  1    Watts  15    RPM  10    Minutes  15    METs  1.2      Biostep-RELP   Level  2    SPM  50    Minutes  15    METs  2      Prescription Details   Frequency (times per week)  3    Duration  Progress to 30 minutes of continuous aerobic without signs/symptoms of physical distress      Intensity   THRR 40-80% of Max Heartrate  122-139    Ratings of Perceived Exertion  11-15    Perceived Dyspnea  0-4      Progression   Progression  Continue progressive overload as per policy without signs/symptoms or physical distress.      Resistance Training   Weight  3    Reps  10-15       Perform Capillary Blood Glucose checks as needed.  Exercise Prescription Changes: Exercise Prescription Changes    Row Name 01/28/19 1600             Response to Exercise   Blood Pressure (Admit)  118/58       Blood Pressure (Exercise)  146/70       Blood Pressure (Exit)  120/58       Heart Rate (Admit)  104 bpm       Heart Rate (Exercise)  127 bpm       Heart Rate (Exit)  89 bpm       Oxygen Saturation (Admit)  92 %       Oxygen Saturation (Exercise)  97 %       Oxygen Saturation (Exit)  95 %       Rating of Perceived Exertion (Exercise)  11       Perceived Dyspnea (Exercise)  1       Symptoms  - Patient stopped to rest because she felt tired and  thirsty.       Duration  Progress to 30 minutes of  aerobic without signs/symptoms of physical distress       Intensity  THRR New         Progression   Progression  Continue to progress workloads to maintain intensity without signs/symptoms of physical distress.          Exercise Comments: Exercise Comments    Row Name 12/17/18 1016           Exercise Comments  Pt would like to work towards coming off her oxygen          Exercise Goals and Review: Exercise Goals    Row Name 12/17/18 1014 01/28/19 1611           Exercise Goals   Increase Physical Activity  Yes  Yes      Intervention  Provide advice, education, support and counseling about physical activity/exercise needs.;Develop an  individualized exercise prescription for aerobic and resistive training based on initial evaluation findings, risk stratification, comorbidities and participant's personal goals.  Provide advice, education, support and counseling about physical activity/exercise needs.;Develop an individualized exercise prescription for aerobic and resistive training based on initial evaluation findings, risk stratification, comorbidities and participant's personal goals.      Expected Outcomes  Long Term: Add in home exercise to make exercise part of routine and to increase amount of physical activity.;Short Term: Attend rehab on a regular basis to increase amount of physical activity.;Long Term: Exercising regularly at least 3-5 days a week.  Long Term: Add in home exercise to make exercise part of routine and to increase amount of physical activity.;Short Term: Attend rehab on a regular basis to increase amount of physical activity.;Long Term: Exercising regularly at least 3-5 days a week.      Increase Strength and Stamina  Yes  Yes      Intervention  Provide advice, education, support and counseling about physical activity/exercise needs.;Develop an individualized exercise prescription for aerobic and resistive  training based on initial evaluation findings, risk stratification, comorbidities and participant's personal goals.  Provide advice, education, support and counseling about physical activity/exercise needs.;Develop an individualized exercise prescription for aerobic and resistive training based on initial evaluation findings, risk stratification, comorbidities and participant's personal goals.      Expected Outcomes  Short Term: Increase workloads from initial exercise prescription for resistance, speed, and METs.;Short Term: Perform resistance training exercises routinely during rehab and add in resistance training at home;Long Term: Improve cardiorespiratory fitness, muscular endurance and strength as measured by increased METs and functional capacity (6MWT)  Short Term: Increase workloads from initial exercise prescription for resistance, speed, and METs.;Short Term: Perform resistance training exercises routinely during rehab and add in resistance training at home;Long Term: Improve cardiorespiratory fitness, muscular endurance and strength as measured by increased METs and functional capacity (6MWT)      Able to understand and use rate of perceived exertion (RPE) scale  Yes  Yes      Intervention  Provide education and explanation on how to use RPE scale  Provide education and explanation on how to use RPE scale      Expected Outcomes  Short Term: Able to use RPE daily in rehab to express subjective intensity level;Long Term:  Able to use RPE to guide intensity level when exercising independently  Short Term: Able to use RPE daily in rehab to express subjective intensity level;Long Term:  Able to use RPE to guide intensity level when exercising independently      Able to understand and use Dyspnea scale  Yes  Yes      Intervention  Provide education and explanation on how to use Dyspnea scale  Provide education and explanation on how to use Dyspnea scale      Expected Outcomes  Short Term: Able to use  Dyspnea scale daily in rehab to express subjective sense of shortness of breath during exertion;Long Term: Able to use Dyspnea scale to guide intensity level when exercising independently  Short Term: Able to use Dyspnea scale daily in rehab to express subjective sense of shortness of breath during exertion;Long Term: Able to use Dyspnea scale to guide intensity level when exercising independently      Knowledge and understanding of Target Heart Rate Range (THRR)  Yes  Yes      Intervention  Provide education and explanation of THRR including how the numbers were predicted and where they are located for  reference  Provide education and explanation of THRR including how the numbers were predicted and where they are located for reference      Expected Outcomes  Short Term: Able to state/look up THRR;Short Term: Able to use daily as guideline for intensity in rehab;Long Term: Able to use THRR to govern intensity when exercising independently  Short Term: Able to state/look up THRR;Short Term: Able to use daily as guideline for intensity in rehab;Long Term: Able to use THRR to govern intensity when exercising independently      Able to check pulse independently  Yes  Yes      Intervention  Provide education and demonstration on how to check pulse in carotid and radial arteries.;Review the importance of being able to check your own pulse for safety during independent exercise  Provide education and demonstration on how to check pulse in carotid and radial arteries.;Review the importance of being able to check your own pulse for safety during independent exercise      Expected Outcomes  Short Term: Able to explain why pulse checking is important during independent exercise;Long Term: Able to check pulse independently and accurately  Short Term: Able to explain why pulse checking is important during independent exercise;Long Term: Able to check pulse independently and accurately      Understanding of Exercise  Prescription  Yes  Yes      Intervention  Provide education, explanation, and written materials on patient's individual exercise prescription  Provide education, explanation, and written materials on patient's individual exercise prescription      Expected Outcomes  Short Term: Able to explain program exercise prescription;Long Term: Able to explain home exercise prescription to exercise independently  Short Term: Able to explain program exercise prescription;Long Term: Able to explain home exercise prescription to exercise independently         Exercise Goals Re-Evaluation : Exercise Goals Re-Evaluation    [place] Name 01/07/19 1059             Exercise Goal Re-Evaluation   Exercise Goals Review  Increase Physical Activity;Increase Strength and Stamina;Understanding of Exercise Prescription       Comments  Adore has been exercising some.  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  She aims for at least 30 min every day!!  Overall, she is feeling good and eager to start rehab in the gym and get back on a treadmill again.       Expected Outcomes  Short: Continue to at least walk daily.  Long: Get started with rehab in gym.          Discharge Exercise Prescription (Final Exercise Prescription Changes): Exercise Prescription Changes - 01/28/19 1600      Response to Exercise   Blood Pressure (Admit)  118/58    Blood Pressure (Exercise)  146/70    Blood Pressure (Exit)  120/58    Heart Rate (Admit)  104 bpm    Heart Rate (Exercise)  127 bpm    Heart Rate (Exit)  89 bpm    Oxygen Saturation (Admit)  92 %    Oxygen Saturation (Exercise)  97 %    Oxygen Saturation (Exit)  95 %    Rating of Perceived Exertion (Exercise)  11    Perceived Dyspnea (Exercise)  1    Symptoms  --   Patient stopped to rest because she felt tired and thirsty.   Duration  Progress to 30  minutes of  aerobic without signs/symptoms of physical distress     Intensity  THRR New      Progression   Progression  Continue to progress workloads to maintain intensity without signs/symptoms of physical distress.       Nutrition:  Target Goals: Understanding of nutrition guidelines, daily intake of sodium '1500mg'$ , cholesterol '200mg'$ , calories 30% from fat and 7% or less from saturated fats, daily to have 5 or more servings of fruits and vegetables.  Biometrics:    Nutrition Therapy Plan and Nutrition Goals: Nutrition Therapy & Goals - 01/28/19 1340      Nutrition Therapy   Diet  Low Na, HH, DM diet    Protein (specify units)  60-65g    Fiber  25 grams    Whole Grain Foods  3 servings    Saturated Fats  12 max. grams    Fruits and Vegetables  5 servings/day    Sodium  1.5 grams      Personal Nutrition Goals   Nutrition Goal  ST: Adding snacks in between meals to reduce mindless eating LT: breath without O2 and lose 15 lbs (155-->140 lbs)    Comments  Pt on Lasix. Pt reports eating cheerios with 2% milk (wants to change to almond milk but doesn't like it, talked about soymilk) OR frozen pancakes (every 1-2 weeks) OR oatmeal and decaf coffee (they told her in the hospital not the have caffiene but is unsure if thats still true, told her to call her doctor and ask). Pt will drink diet soda. L chicken salad sandwich on whole wheat (used to be tomato, now cant tolerate tomatoes) with some cheetos (puffed). Will snack on cheetos now in quarentine. D chicken (doesn't like other meat) or pinto bean and broccoli casserole. Will watch her Na and says her dr told her that her numbers are fine. Pt would like to work on the amount of snacks she eats due to boredom ; discussed midful eating and halthy snacking. Discussed HH and DM eating. Discussed increased protein and calorie needs due to her pulmonary issues and maintaining muscle mass. Pt reports sister will shop for her and is trying to get her to buy [facility] and DM friendly foods, just got her to stop bringing  sweets.      Intervention Plan   Intervention  Prescribe, educate and counsel regarding individualized specific dietary modifications aiming towards targeted core components such as weight, hypertension, lipid management, diabetes, heart failure and other comorbidities.;Nutrition handout(s) given to patient.    Expected Outcomes  Short Term Goal: Understand basic principles of dietary content, such as calories, fat, sodium, cholesterol and nutrients.;Short Term Goal: A plan has been developed with personal nutrition goals set during dietitian appointment.;Long Term Goal: Adherence to prescribed nutrition plan.       Nutrition Assessments:   Nutrition Goals Re-Evaluation:   Nutrition Goals Discharge (Final Nutrition Goals Re-Evaluation):   Psychosocial: Target Goals: Acknowledge presence or absence of significant depression and/or stress, maximize coping skills, provide positive support system. Participant is able to verbalize types and ability to use techniques and skills needed for reducing stress and depression.   Initial Review & Psychosocial Screening: Initial Psych Review & Screening - 01/20/19 1052      Initial Review   Current issues with  Current Stress Concerns;Current Sleep Concerns    Source of Stress Concerns  Chronic Illness    Comments  [place] is self isolating away from family and friends. Her MD recently told her  not to go back to church because of being high risk. She misses it, but stays in contact with her church friends and her family daily. She reports not sleeping well mainly because of the concentrator's noise level. She is also getting ready to move to a bigger apartment in September which she is really excited about!      Family Dynamics   Good Support System?  Yes      Barriers   Psychosocial barriers to participate in program  There are no identifiable barriers or psychosocial needs.      Screening Interventions   Interventions  Encouraged to exercise     Expected Outcomes  Long Term Goal: Stressors or current issues are controlled or eliminated.;Short Term goal: Utilizing psychosocial counselor, staff and physician to assist with identification of specific Stressors or current issues interfering with healing process. Setting desired goal for each stressor or current issue identified.;Short Term goal: Identification and review with participant of any Quality of Life or Depression concerns found by scoring the questionnaire.;Long Term goal: The participant improves quality of Life and PHQ9 Scores as seen by post scores and/or verbalization of changes       Quality of Life Scores:   Scores of 19 and below usually indicate a poorer quality of life in these areas.  A difference of  2-3 points is a clinically meaningful difference.  A difference of 2-3 points in the total score of the Quality of Life Index has been associated with significant improvement in overall quality of life, self-image, physical symptoms, and general health in studies assessing change in quality of life.  PHQ-9: Recent Review Flowsheet Data    There is no flowsheet data to display.     Interpretation of Total Score  Total Score Depression Severity:  1-4 = Minimal depression, 5-9 = Mild depression, 10-14 = Moderate depression, 15-19 = Moderately severe depression, 20-27 = Severe depression   Psychosocial Evaluation and Intervention:   Psychosocial Re-Evaluation: Psychosocial Re-Evaluation    [place] Name 01/07/19 1111             Psychosocial Re-Evaluation   Current issues with  Current Stress Concerns       Comments  She is feeling good at home.  She is worried about her younger daughter not wearing a mask.  Her oldest is a Marine scientist and has tested positive for COVID already.  She is worried for her family, but coping the best she can.       Expected Outcomes  Short: Continue to talk to daughters on phone for connection and get started with rehab.  Long: Continue to stay  positive.       Interventions  Encouraged to attend Cardiac Rehabilitation for the exercise       Continue Psychosocial Services   Follow up required by staff          Psychosocial Discharge (Final Psychosocial Re-Evaluation): Psychosocial Re-Evaluation - 01/07/19 1111      Psychosocial Re-Evaluation   Current issues with  Current Stress Concerns    Comments  She is feeling good at home.  She is worried about her younger daughter not wearing a mask.  Her oldest is a Marine scientist and has tested positive for COVID already.  She is worried for her family, but coping the best she can.    Expected Outcomes  Short: Continue to talk to daughters on phone for connection and get started with rehab.  Long: Continue to stay positive.  Interventions  Encouraged to attend Cardiac Rehabilitation for the exercise    Continue Psychosocial Services   Follow up required by staff       Vocational Rehabilitation: Provide vocational rehab assistance to qualifying candidates.   Vocational Rehab Evaluation & Intervention: Vocational Rehab - 01/20/19 1044      Initial Vocational Rehab Evaluation & Intervention   Assessment shows need for Vocational Rehabilitation  No       Education: Education Goals: Education classes will be provided on a variety of topics geared toward better understanding of heart health and risk factor modification. Participant will state understanding/return demonstration of topics presented as noted by education test scores.  Learning Barriers/Preferences: Learning Barriers/Preferences - 01/20/19 1052      Learning Barriers/Preferences   Learning Barriers  None    Learning Preferences  None       Education Topics:  AED/CPR: - Group verbal and written instruction with the use of models to demonstrate the basic use of the AED with the basic ABC's of resuscitation.   General Nutrition Guidelines/Fats and Fiber: -Group instruction provided by verbal, written material, models and  posters to present the general guidelines for heart healthy nutrition. Gives an explanation and review of dietary fats and fiber.   Controlling Sodium/Reading Food Labels: -Group verbal and written material supporting the discussion of sodium use in heart healthy nutrition. Review and explanation with models, verbal and written materials for utilization of the food label.   Exercise Physiology & General Exercise Guidelines: - Group verbal and written instruction with models to review the exercise physiology of the cardiovascular system and associated critical values. Provides general exercise guidelines with specific guidelines to those with heart or lung disease.    Aerobic Exercise & Resistance Training: - Gives group verbal and written instruction on the various components of exercise. Focuses on aerobic and resistive training programs and the benefits of this training and how to safely progress through these programs..   Flexibility, Balance, Mind/Body Relaxation: Provides group verbal/written instruction on the benefits of flexibility and balance training, including mind/body exercise modes such as yoga, pilates and tai chi.  Demonstration and skill practice provided.   Stress and Anxiety: - Provides group verbal and written instruction about the health risks of elevated stress and causes of high stress.  Discuss the correlation between heart/lung disease and anxiety and treatment options. Review healthy ways to manage with stress and anxiety.   Depression: - Provides group verbal and written instruction on the correlation between heart/lung disease and depressed mood, treatment options, and the stigmas associated with seeking treatment.   Anatomy & Physiology of the Heart: - Group verbal and written instruction and models provide basic cardiac anatomy and physiology, with the coronary electrical and arterial systems. Review of Valvular disease and Heart Failure   Cardiac  Procedures: - Group verbal and written instruction to review commonly prescribed medications for heart disease. Reviews the medication, class of the drug, and side effects. Includes the steps to properly store meds and maintain the prescription regimen. (beta blockers and nitrates)   Cardiac Medications I: - Group verbal and written instruction to review commonly prescribed medications for heart disease. Reviews the medication, class of the drug, and side effects. Includes the steps to properly store meds and maintain the prescription regimen.   Cardiac Medications II: -Group verbal and written instruction to review commonly prescribed medications for heart disease. Reviews the medication, class of the drug, and side effects. (all other drug classes)  Go Sex-Intimacy & Heart Disease, Get SMART - Goal Setting: - Group verbal and written instruction through game format to discuss heart disease and the return to sexual intimacy. Provides group verbal and written material to discuss and apply goal setting through the application of the S.M.A.R.T. Method.   Other Matters of the Heart: - Provides group verbal, written materials and models to describe Stable Angina and Peripheral Artery. Includes description of the disease process and treatment options available to the cardiac patient.   Exercise & Equipment Safety: - Individual verbal instruction and demonstration of equipment use and safety with use of the equipment.   Cardiac Rehab from 01/28/2019 in [facility] Cardiac and Pulmonary Rehab  Date  01/28/19  Educator  [place]  Instruction Review Code  1- Verbalizes Understanding      Infection Prevention: - Provides verbal and written material to individual with discussion of infection control including proper hand washing and proper equipment cleaning during exercise session.   Cardiac Rehab from 01/28/2019 in [facility] Cardiac and Pulmonary Rehab  Date  01/28/19  Educator  [place]  Instruction Review Code  1-  Verbalizes Understanding      Falls Prevention: - Provides verbal and written material to individual with discussion of falls prevention and safety.   Cardiac Rehab from 01/28/2019 in [facility] Cardiac and Pulmonary Rehab  Date  01/28/19  Educator  [place]  Instruction Review Code  1- Verbalizes Understanding      Diabetes: - Individual verbal and written instruction to review signs/symptoms of diabetes, desired ranges of glucose level fasting, after meals and with exercise. Acknowledge that pre and post exercise glucose checks will be done for 3 sessions at entry of program.   Cardiac Rehab from 01/28/2019 in [facility] Cardiac and Pulmonary Rehab  Date  01/28/19  Educator  [place]  Instruction Review Code  1- Verbalizes Understanding      Know Your Numbers and Risk Factors: -Group verbal and written instruction about important numbers in your health.  Discussion of what are risk factors and how they play a role in the disease process.  Review of Cholesterol, Blood Pressure, Diabetes, and BMI and the role they play in your overall health.   Sleep Hygiene: -Provides group verbal and written instruction about how sleep can affect your health.  Define sleep hygiene, discuss sleep cycles and impact of sleep habits. Review good sleep hygiene tips.    Other: -Provides group and verbal instruction on various topics (see comments)   Knowledge Questionnaire Score:   Core Components/Risk Factors/Patient Goals at Admission: Personal Goals and Risk Factors at Admission - 12/17/18 1009      Core Components/Risk Factors/Patient Goals on Admission    Weight Management  Yes;Weight Loss    Intervention  Weight Management: Develop a combined nutrition and exercise program designed to reach desired caloric intake, while maintaining appropriate intake of nutrient and fiber, sodium and fats, and appropriate energy expenditure required for the weight goal.;Weight Management: Provide education and appropriate resources  to help participant work on and attain dietary goals.    Expected Outcomes  Short Term: Continue to assess and modify interventions until short term weight is achieved;Long Term: Adherence to nutrition and physical activity/exercise program aimed toward attainment of established weight goal;Weight Loss: Understanding of general recommendations for a balanced deficit meal plan, which promotes 1-2 lb weight loss per week and includes a negative energy balance of [phone] kcal/d;Understanding recommendations for meals to include 15-35% energy as protein, 25-35% energy from fat, 35-60% energy from  carbohydrates, less than '200mg'$  of dietary cholesterol, 20-35 gm of total fiber daily;Understanding of distribution of calorie intake throughout the day with the consumption of 4-5 meals/snacks    Diabetes  Yes    Intervention  Provide education about signs/symptoms and action to take for hypo/hyperglycemia.;Provide education about proper nutrition, including hydration, and aerobic/resistive exercise prescription along with prescribed medications to achieve blood glucose in normal ranges: Fasting glucose 65-99 mg/dL    Expected Outcomes  Short Term: Participant verbalizes understanding of the signs/symptoms and immediate care of hyper/hypoglycemia, proper foot care and importance of medication, aerobic/resistive exercise and nutrition plan for blood glucose control.;Long Term: Attainment of HbA1C < 7%.    Hypertension  Yes    Intervention  Provide education on lifestyle modifcations including regular physical activity/exercise, weight management, moderate sodium restriction and increased consumption of fresh fruit, vegetables, and low fat dairy, alcohol moderation, and smoking cessation.;Monitor prescription use compliance.    Expected Outcomes  Short Term: Continued assessment and intervention until BP is < 140/76m HG in hypertensive participants. < 130/844mHG in hypertensive participants with diabetes, heart failure  or chronic kidney disease.;Long Term: Maintenance of blood pressure at goal levels.    Lipids  Yes    Intervention  Provide education and support for participant on nutrition & aerobic/resistive exercise along with prescribed medications to achieve LDL '70mg'$ , HDL >'40mg'$ .    Expected Outcomes  Long Term: Cholesterol controlled with medications as prescribed, with individualized exercise RX and with personalized nutrition plan. Value goals: LDL < '70mg'$ , HDL > 40 mg.;Short Term: Participant states understanding of desired cholesterol values and is compliant with medications prescribed. Participant is following exercise prescription and nutrition guidelines.       Core Components/Risk Factors/Patient Goals Review:  Goals and Risk Factor Review    Row Name 01/07/19 1112 01/20/19 1046 01/28/19 1612         Core Components/Risk Factors/Patient Goals Review   Personal Goals Review  Weight Management/Obesity;Hypertension  Weight Management/Obesity;Diabetes;Hypertension  Weight Management/Obesity;Diabetes;Hypertension     Review  Her weight and blood pressures have been good and she has not noted any problems.  Her weight is a little up, but slowly. She is keeping an eye on it, but still wants to lose weight. Her current weight is 153lb and she wants to get down to 140 lb. Her blood pressure has been running well. She feels like she is managing her diabetes well.  Her weight is a little up, but slowly. She is keeping an eye on it, but still wants to lose weight. Her current weight is 153lb and she wants to get down to 140 lb. Her blood pressure has been running well. She feels like she is managing her diabetes well.     Expected Outcomes  Short and Long: Continue to monitor risk factors.  Short: come to Cardiac Rehab to learn more about risk factors. Long: become independent with managing risk factors.  Short: come to Cardiac Rehab to learn more about risk factors. Long: become independent with managing risk  factors.        Core Components/Risk Factors/Patient Goals at Discharge (Final Review):  Goals and Risk Factor Review - 01/28/19 1612      Core Components/Risk Factors/Patient Goals Review   Personal Goals Review  Weight Management/Obesity;Diabetes;Hypertension    Review  Her weight is a little up, but slowly. She is keeping an eye on it, but still wants to lose weight. Her current weight is 153lb and she wants to get  down to 140 lb. Her blood pressure has been running well. She feels like she is managing her diabetes well.    Expected Outcomes  Short: come to Cardiac Rehab to learn more about risk factors. Long: become independent with managing risk factors.       ITP Comments: ITP Comments    Row Name 12/10/18 1057 12/17/18 1006 12/25/18 1004 01/07/19 1044 01/20/19 1035   ITP Comments  Initial Visit for Virtual [facility] today. Consent and intake completed. Appt made for EP and RD .  Completed initial ExRx created and sent to Dr. Emily Filbert, Medical Director to review and sign.  [place] contacted [place] via the [employer] App that she needs to hold off on exercise until cleared by her doctor.  Virtual appt completed.  She is feeling better and has been wearing an event monitor for 14 days. She turns it in on Thurs 01/09/19.  She has had a few episodes of "weakness" feeling and would like to wait to actually come in person until after her f/u visit on 01/27/19 with Dr. Sabra Heck.  RN Virtual orientation completed. Diagnosis can be found in CE 2/18. EP/RD scheduled for 8/4      Comments: Initial ITP

## 2019-01-28 NOTE — Progress Notes (Signed)
Daily Session Note  Patient Details  Name: Andrea [name] MRN: [id number] Date of Birth: 1946-04-24 Referring Provider:     Cardiac Rehab from 01/28/2019 in [facility] Cardiac and Pulmonary Rehab  Referring Provider  Chong Sicilian MD      Encounter Date: 01/28/2019  Check In: Session Check In - 01/28/19 1555      Check-In   Supervising physician immediately available to respond to emergencies  See telemetry face sheet for immediately available ER MD    Location  [facility]-Cardiac & Pulmonary Rehab    Staff Present  Heath Lark, RN, BSN, CCRP;Amanda Sommer, BA, ACSM CEP, Exercise Physiologist;Jeanna Durrell BS, Exercise Physiologist    Virtual Visit  No    Medication changes reported      No    Fall or balance concerns reported     No    Warm-up and Cool-down  Not performed (comment)    Resistance Training Performed  No    VAD Patient?  No    PAD/SET Patient?  No      Pain Assessment   Currently in Pain?  No/denies        Exercise Prescription Changes - 01/28/19 1600      Response to Exercise   Blood Pressure (Admit)  118/58    Blood Pressure (Exercise)  146/70    Blood Pressure (Exit)  120/58    Heart Rate (Admit)  104 bpm    Heart Rate (Exercise)  127 bpm    Heart Rate (Exit)  89 bpm    Oxygen Saturation (Admit)  92 %    Oxygen Saturation (Exercise)  97 %    Oxygen Saturation (Exit)  95 %    Rating of Perceived Exertion (Exercise)  11    Perceived Dyspnea (Exercise)  1    Symptoms  --   Patient stopped to rest because she felt tired and thirsty.   Duration  Progress to 30 minutes of  aerobic without signs/symptoms of physical distress    Intensity  THRR New      Progression   Progression  Continue to progress workloads to maintain intensity without signs/symptoms of physical distress.       Social History   Tobacco Use  Smoking Status Never Smoker  Smokeless Tobacco Never Used    Goals Met:  Exercise tolerated well No report of cardiac concerns or  symptoms  Goals Unmet:  Not Applicable  Comments:  6 Minute Walk    Row Name 01/28/19 1557         6 Minute Walk   Phase  Initial     Distance  660 feet     Walk Time  4.48 minutes     # of Rest Breaks  1     MPH  1.2     METS  2     RPE  11     Perceived Dyspnea   1     VO2 Peak  7     Symptoms  Yes (comment)     Comments  Patient reports taking a break due to being tired and thirsty from wearing a mask.     Resting HR  104 bpm     Resting BP  118/58     Resting Oxygen Saturation   92 %     Exercise Oxygen Saturation  during 6 min walk  97 %     Max Ex. HR  127 bpm     Max Ex. BP  146/70  2 Minute Post BP  120/58         Dr. Emily Filbert is Medical Director for [place] and [name].

## 2019-01-29 DIAGNOSIS — Z951 Presence of aortocoronary bypass graft: Secondary | ICD-10-CM

## 2019-01-29 LAB — GLUCOSE, CAPILLARY
Glucose-Capillary: 212 mg/dL — ABNORMAL HIGH (ref 70–99)
Glucose-Capillary: 186 mg/dL — ABNORMAL HIGH (ref 70–99)

## 2019-01-29 NOTE — Progress Notes (Signed)
Daily Session Note  Patient Details  Name: Andrea [name] MRN: [id number] Date of Birth: [birth year] Referring Provider:     Cardiac Rehab from 01/28/2019 in [facility] Cardiac and Pulmonary Rehab  Referring Provider  Chong Sicilian MD      Encounter Date: 01/29/2019  Check In: Session Check In - 01/29/19 1657      Check-In   Supervising physician immediately available to respond to emergencies  See telemetry face sheet for immediately available ER MD    Location  [facility]-Cardiac & Pulmonary Rehab    Staff Present  Justin Mend RCP,RRT,BSRT;Heath Lark, RN, BSN, [employer], BA, ACSM CEP, Exercise Physiologist;Mary Kellie Shropshire, RN, BSN, MA    Virtual Visit  No    Medication changes reported      No    Fall or balance concerns reported     No    Warm-up and Cool-down  Performed on first and last piece of equipment    Resistance Training Performed  Yes    VAD Patient?  No    PAD/SET Patient?  No      Pain Assessment   Currently in Pain?  No/denies          Social History   Tobacco Use  Smoking Status Never Smoker  Smokeless Tobacco Never Used    Goals Met:  Exercise tolerated well Personal goals reviewed No report of cardiac concerns or symptoms Strength training completed today  Goals Unmet:  Not Applicable  Comments: First full day of exercise!  Patient was oriented to gym and equipment including functions, settings, policies, and procedures.  Patient's individual exercise prescription and treatment plan were reviewed.  All starting workloads were established based on the results of the 6 minute walk test done at initial orientation visit.  The plan for exercise progression was also introduced and progression will be customized based on patient's performance and goals.   Dr. Emily Filbert is Medical Director for [place] and [name].

## 2019-01-30 ENCOUNTER — Encounter: Payer: Medicare Other | Admitting: *Deleted

## 2019-01-30 ENCOUNTER — Other Ambulatory Visit: Payer: Self-pay

## 2019-01-30 DIAGNOSIS — Z951 Presence of aortocoronary bypass graft: Secondary | ICD-10-CM

## 2019-01-30 LAB — GLUCOSE, CAPILLARY
Glucose-Capillary: 172 mg/dL — ABNORMAL HIGH (ref 70–99)
Glucose-Capillary: 166 mg/dL — ABNORMAL HIGH (ref 70–99)

## 2019-01-30 NOTE — Progress Notes (Signed)
Daily Session Note  Patient Details  Name: Andrea [name] MRN: [id number] Date of Birth: February 05, 1946 Referring Provider:     Cardiac Rehab from 01/28/2019 in [facility] Cardiac and Pulmonary Rehab  Referring Provider  Chong Sicilian MD      Encounter Date: 01/30/2019  Check In: Session Check In - 01/30/19 1601      Check-In   Supervising physician immediately available to respond to emergencies  See telemetry face sheet for immediately available ER MD    Location  [facility]-Cardiac & Pulmonary Rehab    Staff Present  Renita Papa, RN BSN;Laureen Owens Shark, BS, RRT, CPFT;Amanda Oletta Darter, BA, ACSM CEP, Exercise Physiologist;Joseph Tessie Fass RCP,RRT,BSRT    Virtual Visit  No    Medication changes reported      No    Fall or balance concerns reported     No    Warm-up and Cool-down  Performed on first and last piece of equipment    Resistance Training Performed  Yes    VAD Patient?  No    PAD/SET Patient?  No      Pain Assessment   Currently in Pain?  No/denies          Social History   Tobacco Use  Smoking Status Never Smoker  Smokeless Tobacco Never Used    Goals Met:  Independence with exercise equipment Exercise tolerated well No report of cardiac concerns or symptoms Strength training completed today  Goals Unmet:  Not Applicable  Comments: Pt able to follow exercise prescription today without complaint.  Will continue to monitor for progression.    Dr. Emily Filbert is Medical Director for [place] and [name].

## 2019-02-03 ENCOUNTER — Other Ambulatory Visit: Payer: Self-pay

## 2019-02-03 DIAGNOSIS — Z951 Presence of aortocoronary bypass graft: Secondary | ICD-10-CM

## 2019-02-03 LAB — GLUCOSE, CAPILLARY
Glucose-Capillary: 125 mg/dL — ABNORMAL HIGH (ref 70–99)
Glucose-Capillary: 118 mg/dL — ABNORMAL HIGH (ref 70–99)

## 2019-02-03 NOTE — Progress Notes (Signed)
Daily Session Note  Patient Details  Name: Andrea [name] MRN: [id number] Date of Birth: 03/30/1946 Referring Provider:     Cardiac Rehab from 01/28/2019 in [facility] Cardiac and Pulmonary Rehab  Referring Provider  Miller, Paula MD      Encounter Date: 02/03/2019  Check In:      Social History   Tobacco Use  Smoking Status Never Smoker  Smokeless Tobacco Never Used    Goals Met:  Independence with exercise equipment Exercise tolerated well No report of cardiac concerns or symptoms Strength training completed today  Goals Unmet:  Not Applicable  Comments: Pt able to follow exercise prescription today without complaint.  Will continue to monitor for progression.    Dr. Mark Miller is Medical Director for [facility] and [name]. 

## 2019-02-05 ENCOUNTER — Encounter: Payer: Self-pay | Admitting: *Deleted

## 2019-02-05 ENCOUNTER — Other Ambulatory Visit: Payer: Self-pay

## 2019-02-05 DIAGNOSIS — Z951 Presence of aortocoronary bypass graft: Secondary | ICD-10-CM

## 2019-02-05 NOTE — Progress Notes (Signed)
Daily Session Note  Patient Details  Name: Andrea [name] MRN: [id number] Date of Birth: 1945/12/25 Referring Provider:     Cardiac Rehab from 01/28/2019 in [facility] Cardiac and Pulmonary Rehab  Referring Provider  Chong Sicilian MD      Encounter Date: 02/05/2019  Check In:      Social History   Tobacco Use  Smoking Status Never Smoker  Smokeless Tobacco Never Used    Goals Met:  Independence with exercise equipment Exercise tolerated well No report of cardiac concerns or symptoms Strength training completed today  Goals Unmet:  Not Applicable  Comments: Pt able to follow exercise prescription today without complaint.  Will continue to monitor for progression.    Dr. Emily Filbert is Medical Director for [place] and [name].

## 2019-02-05 NOTE — Progress Notes (Signed)
Cardiac Individual Treatment Plan  Patient Details  Name: Andrea [name] MRN: [id number] Date of Birth: 1946/05/25 Referring Provider:     Cardiac Rehab from 01/28/2019 in [facility] Cardiac and Pulmonary Rehab  Referring Provider  Chong Sicilian MD      Initial Encounter Date:    Cardiac Rehab from 01/28/2019 in [facility] Cardiac and Pulmonary Rehab  Date  01/28/19      Visit Diagnosis: S/P CABG x 3  Patient's Home Medications on Admission:  Current Outpatient Medications:  .  albuterol (PROVENTIL HFA;VENTOLIN HFA) 108 (90 Base) MCG/ACT inhaler, Inhale 2 puffs into the lungs every 6 (six) hours as needed for wheezing or shortness of breath., Disp: 1 Inhaler, Rfl: 0 .  amLODipine (NORVASC) 10 MG tablet, , Disp: , Rfl:  .  aspirin EC 81 MG tablet, Take 81 mg by mouth daily., Disp: , Rfl:  .  atorvastatin (LIPITOR) 40 MG tablet, Take 40 mg by mouth at bedtime., Disp: , Rfl: 3 .  benzonatate (TESSALON) 100 MG capsule, Take 1 capsule (100 mg total) by mouth 3 (three) times daily. (Patient not taking: Reported on 01/20/2019), Disp: 20 capsule, Rfl: 0 .  budesonide-formoterol (SYMBICORT) 160-4.5 MCG/ACT inhaler, INHALE TWO PUFFS BY MOUTH TWICE DAILY, Disp: , Rfl:  .  chlorpheniramine-HYDROcodone (TUSSIONEX) 10-8 MG/5ML SUER, Take 5 mLs by mouth at bedtime as needed for cough. (Patient not taking: Reported on 12/10/2018), Disp: 115 mL, Rfl: 0 .  Cholecalciferol (VITAMIN D3) 1000 units CAPS, Take 1 capsule by mouth daily., Disp: , Rfl:  .  ELIQUIS 5 MG TABS tablet, , Disp: , Rfl:  .  fluticasone (FLONASE) 50 MCG/ACT nasal spray, 1 spray by Each Nare route daily., Disp: , Rfl:  .  furosemide (LASIX) 20 MG tablet, , Disp: , Rfl:  .  glipiZIDE (GLUCOTROL) 10 MG tablet, TAKE ONE TABLET BY MOUTH TWICE DAILY TAKE  30  MINUTES  BEFORE  A  MEAL*, Disp: , Rfl:  .  JANUVIA 50 MG tablet, , Disp: , Rfl:  .  metoprolol succinate (TOPROL-XL) 25 MG 24 hr tablet, TAKE ONE TABLET BY MOUTH ONCE DAILY, Disp: , Rfl:  .   Multiple Vitamin (MULTI-VITAMINS) TABS, Take 1 tablet by mouth daily., Disp: , Rfl:  .  nitroGLYCERIN (NITROSTAT) 0.4 MG SL tablet, PLACE 1 TABLET UNDER THE TONGUE EVERY 5 MINUTES AS NEEDED FOR CHEST PAIN, Disp: , Rfl:  .  omeprazole (PRILOSEC) 20 MG capsule, Take 1 capsule by mouth daily., Disp: , Rfl:  .  predniSONE (DELTASONE) 10 MG tablet, 4 tabs po daily for two days (Patient not taking: Reported on 12/10/2018), Disp: 8 tablet, Rfl: 0 .  sitaGLIPtin (JANUVIA) 50 MG tablet, Take 50 mg by mouth., Disp: , Rfl:  .  tiotropium (SPIRIVA HANDIHALER) 18 MCG inhalation capsule, INHALE ONE DOSE BY MOUTH ONCE DAILY, Disp: , Rfl:   Past Medical History: Past Medical History:  Diagnosis Date  . COPD (chronic obstructive pulmonary disease) ([place])   . Diabetes mellitus without complication ([place])   . Heart attack ([place])   . Renal disorder     Tobacco Use: Social History   Tobacco Use  Smoking Status Never Smoker  Smokeless Tobacco Never Used    Labs: Recent Review Flowsheet Data    Labs for ITP Cardiac and Pulmonary Rehab Latest Ref Rng & Units 09/21/2017 11/16/2017   Hemoglobin A1c 4.8 - 5.6 % 7.2(H) 7.3(H)       Exercise Target Goals: Exercise Program Goal: Individual exercise prescription set using  results from initial 6 min walk test and THRR while considering  patient's activity barriers and safety.   Exercise Prescription Goal: Initial exercise prescription builds to 30-45 minutes a day of aerobic activity, 2-3 days per week.  Home exercise guidelines will be given to patient during program as part of exercise prescription that the participant will acknowledge.  Activity Barriers & Risk Stratification: Activity Barriers & Cardiac Risk Stratification - 12/17/18 1006      Activity Barriers & Cardiac Risk Stratification   Activity Barriers  Balance Concerns;History of Falls;Assistive Device;Other (comment);Deconditioning;Muscular Weakness    Comments  Currently wearing brace for foot  after concentator fell on it, using walker    Cardiac Risk Stratification  High       6 Minute Walk: 6 Minute Walk    Row Name 01/28/19 1557         6 Minute Walk   Phase  Initial     Distance  660 feet     Walk Time  4.48 minutes     # of Rest Breaks  1     MPH  1.2     METS  2     RPE  11     Perceived Dyspnea   1     VO2 Peak  7     Symptoms  Yes (comment)     Comments  Patient reports taking a break due to being tired and thirsty from wearing a mask.     Resting HR  104 bpm     Resting BP  118/58     Resting Oxygen Saturation   92 %     Exercise Oxygen Saturation  during 6 min walk  97 %     Max Ex. HR  127 bpm     Max Ex. BP  146/70     2 Minute Post BP  120/58        Oxygen Initial Assessment: Oxygen Initial Assessment - 12/17/18 1009      Home Oxygen   Home Oxygen Device  Home Concentrator;E-Tanks;Portable Concentrator    Sleep Oxygen Prescription  Continuous    Liters per minute  2    Home Exercise Oxygen Prescription  Pulsed    Liters per minute  2    Home at Rest Exercise Oxygen Prescription  None    Compliance with Home Oxygen Use  Yes      Program Oxygen Prescription   Program Oxygen Prescription  Portable Concentrator;Pulsed    Liters per minute  2      Intervention   Short Term Goals  To learn and exhibit compliance with exercise, home and travel O2 prescription;To learn and understand importance of monitoring SPO2 with pulse oximeter and demonstrate accurate use of the pulse oximeter.;To learn and understand importance of maintaining oxygen saturations>88%;To learn and demonstrate proper pursed lip breathing techniques or other breathing techniques.;To learn and demonstrate proper use of respiratory medications    Long  Term Goals  Exhibits compliance with exercise, home and travel O2 prescription;Verbalizes importance of monitoring SPO2 with pulse oximeter and return demonstration;Maintenance of O2 saturations>88%;Compliance with respiratory  medication;Exhibits proper breathing techniques, such as pursed lip breathing or other method taught during program session;Demonstrates proper use of MDI's       Oxygen Re-Evaluation: Oxygen Re-Evaluation    Row Name 01/29/19 1700             Program Oxygen Prescription   Program Oxygen Prescription  None;E-Tanks;Continuous  Liters per minute  2         Home Oxygen   Home Oxygen Device  Portable Concentrator;Home Concentrator;E-Tanks       Sleep Oxygen Prescription  Continuous       Liters per minute  2       Home Exercise Oxygen Prescription  Continuous       Liters per minute  2       Home at Rest Exercise Oxygen Prescription  None       Compliance with Home Oxygen Use  Yes         Goals/Expected Outcomes   Short Term Goals  To learn and understand importance of monitoring SPO2 with pulse oximeter and demonstrate accurate use of the pulse oximeter.;To learn and understand importance of maintaining oxygen saturations>88%;To learn and demonstrate proper pursed lip breathing techniques or other breathing techniques.;To learn and exhibit compliance with exercise, home and travel O2 prescription       Long  Term Goals  Compliance with respiratory medication;Exhibits proper breathing techniques, such as pursed lip breathing or other method taught during program session;Maintenance of O2 saturations>88%;Verbalizes importance of monitoring SPO2 with pulse oximeter and return demonstration;Exhibits compliance with exercise, home and travel O2 prescription       Comments  Reviewed PLB technique with pt.  Talked about how it work and it's important to maintaining his exercise saturations.       Goals/Expected Outcomes  Short: Become more profiecient at using PLB.   Long: Become independent at using PLB.          Oxygen Discharge (Final Oxygen Re-Evaluation): Oxygen Re-Evaluation - 01/29/19 1700      Program Oxygen Prescription   Program Oxygen Prescription  None;E-Tanks;Continuous     Liters per minute  2      Home Oxygen   Home Oxygen Device  Portable Concentrator;Home Concentrator;E-Tanks    Sleep Oxygen Prescription  Continuous    Liters per minute  2    Home Exercise Oxygen Prescription  Continuous    Liters per minute  2    Home at Rest Exercise Oxygen Prescription  None    Compliance with Home Oxygen Use  Yes      Goals/Expected Outcomes   Short Term Goals  To learn and understand importance of monitoring SPO2 with pulse oximeter and demonstrate accurate use of the pulse oximeter.;To learn and understand importance of maintaining oxygen saturations>88%;To learn and demonstrate proper pursed lip breathing techniques or other breathing techniques.;To learn and exhibit compliance with exercise, home and travel O2 prescription    Long  Term Goals  Compliance with respiratory medication;Exhibits proper breathing techniques, such as pursed lip breathing or other method taught during program session;Maintenance of O2 saturations>88%;Verbalizes importance of monitoring SPO2 with pulse oximeter and return demonstration;Exhibits compliance with exercise, home and travel O2 prescription    Comments  Reviewed PLB technique with pt.  Talked about how it work and it's important to maintaining his exercise saturations.    Goals/Expected Outcomes  Short: Become more profiecient at using PLB.   Long: Become independent at using PLB.       Initial Exercise Prescription: Initial Exercise Prescription - 01/28/19 1600      Date of Initial Exercise RX and Referring Provider   Date  01/28/19    Referring Provider  Chong Sicilian MD      Oxygen   Oxygen  Continuous    Liters  2      Treadmill  MPH  1.3    Grade  0    Minutes  15    METs  1.2      NuStep   Level  2    SPM  60    Minutes  15    METs  2      Arm Ergometer   Level  1    Watts  15    RPM  10    Minutes  15    METs  1.2      Biostep-RELP   Level  2    SPM  50    Minutes  15    METs  2       Prescription Details   Frequency (times per week)  3    Duration  Progress to 30 minutes of continuous aerobic without signs/symptoms of physical distress      Intensity   THRR 40-80% of Max Heartrate  122-139    Ratings of Perceived Exertion  11-15    Perceived Dyspnea  0-4      Progression   Progression  Continue progressive overload as per policy without signs/symptoms or physical distress.      Resistance Training   Weight  3    Reps  10-15       Perform Capillary Blood Glucose checks as needed.  Exercise Prescription Changes: Exercise Prescription Changes    Row Name 01/28/19 1600             Response to Exercise   Blood Pressure (Admit)  118/58       Blood Pressure (Exercise)  146/70       Blood Pressure (Exit)  120/58       Heart Rate (Admit)  104 bpm       Heart Rate (Exercise)  127 bpm       Heart Rate (Exit)  89 bpm       Oxygen Saturation (Admit)  92 %       Oxygen Saturation (Exercise)  97 %       Oxygen Saturation (Exit)  95 %       Rating of Perceived Exertion (Exercise)  11       Perceived Dyspnea (Exercise)  1       Symptoms  - Patient stopped to rest because she felt tired and thirsty.       Duration  Progress to 30 minutes of  aerobic without signs/symptoms of physical distress       Intensity  THRR New         Progression   Progression  Continue to progress workloads to maintain intensity without signs/symptoms of physical distress.          Exercise Comments: Exercise Comments    Row Name 12/17/18 1016 01/29/19 1658         Exercise Comments  Pt would like to work towards coming off her oxygen  First full day of exercise!  Patient was oriented to gym and equipment including functions, settings, policies, and procedures.  Patient's individual exercise prescription and treatment plan were reviewed.  All starting workloads were established based on the results of the 6 minute walk test done at initial orientation visit.  The plan for exercise  progression was also introduced and progression will be customized based on patient's performance and goals.         Exercise Goals and Review: Exercise Goals    Row Name 12/17/18 1014 01/28/19 1611  Exercise Goals   Increase Physical Activity  Yes  Yes      Intervention  Provide advice, education, support and counseling about physical activity/exercise needs.;Develop an individualized exercise prescription for aerobic and resistive training based on initial evaluation findings, risk stratification, comorbidities and participant's personal goals.  Provide advice, education, support and counseling about physical activity/exercise needs.;Develop an individualized exercise prescription for aerobic and resistive training based on initial evaluation findings, risk stratification, comorbidities and participant's personal goals.      Expected Outcomes  Long Term: Add in home exercise to make exercise part of routine and to increase amount of physical activity.;Short Term: Attend rehab on a regular basis to increase amount of physical activity.;Long Term: Exercising regularly at least 3-5 days a week.  Long Term: Add in home exercise to make exercise part of routine and to increase amount of physical activity.;Short Term: Attend rehab on a regular basis to increase amount of physical activity.;Long Term: Exercising regularly at least 3-5 days a week.      Increase Strength and Stamina  Yes  Yes      Intervention  Provide advice, education, support and counseling about physical activity/exercise needs.;Develop an individualized exercise prescription for aerobic and resistive training based on initial evaluation findings, risk stratification, comorbidities and participant's personal goals.  Provide advice, education, support and counseling about physical activity/exercise needs.;Develop an individualized exercise prescription for aerobic and resistive training based on initial evaluation findings,  risk stratification, comorbidities and participant's personal goals.      Expected Outcomes  Short Term: Increase workloads from initial exercise prescription for resistance, speed, and METs.;Short Term: Perform resistance training exercises routinely during rehab and add in resistance training at home;Long Term: Improve cardiorespiratory fitness, muscular endurance and strength as measured by increased METs and functional capacity (6MWT)  Short Term: Increase workloads from initial exercise prescription for resistance, speed, and METs.;Short Term: Perform resistance training exercises routinely during rehab and add in resistance training at home;Long Term: Improve cardiorespiratory fitness, muscular endurance and strength as measured by increased METs and functional capacity (6MWT)      Able to understand and use rate of perceived exertion (RPE) scale  Yes  Yes      Intervention  Provide education and explanation on how to use RPE scale  Provide education and explanation on how to use RPE scale      Expected Outcomes  Short Term: Able to use RPE daily in rehab to express subjective intensity level;Long Term:  Able to use RPE to guide intensity level when exercising independently  Short Term: Able to use RPE daily in rehab to express subjective intensity level;Long Term:  Able to use RPE to guide intensity level when exercising independently      Able to understand and use Dyspnea scale  Yes  Yes      Intervention  Provide education and explanation on how to use Dyspnea scale  Provide education and explanation on how to use Dyspnea scale      Expected Outcomes  Short Term: Able to use Dyspnea scale daily in rehab to express subjective sense of shortness of breath during exertion;Long Term: Able to use Dyspnea scale to guide intensity level when exercising independently  Short Term: Able to use Dyspnea scale daily in rehab to express subjective sense of shortness of breath during exertion;Long Term: Able to  use Dyspnea scale to guide intensity level when exercising independently      Knowledge and understanding of Target Heart Rate Range (THRR)  Yes  Yes      Intervention  Provide education and explanation of THRR including how the numbers were predicted and where they are located for reference  Provide education and explanation of THRR including how the numbers were predicted and where they are located for reference      Expected Outcomes  Short Term: Able to state/look up THRR;Short Term: Able to use daily as guideline for intensity in rehab;Long Term: Able to use THRR to govern intensity when exercising independently  Short Term: Able to state/look up THRR;Short Term: Able to use daily as guideline for intensity in rehab;Long Term: Able to use THRR to govern intensity when exercising independently      Able to check pulse independently  Yes  Yes      Intervention  Provide education and demonstration on how to check pulse in carotid and radial arteries.;Review the importance of being able to check your own pulse for safety during independent exercise  Provide education and demonstration on how to check pulse in carotid and radial arteries.;Review the importance of being able to check your own pulse for safety during independent exercise      Expected Outcomes  Short Term: Able to explain why pulse checking is important during independent exercise;Long Term: Able to check pulse independently and accurately  Short Term: Able to explain why pulse checking is important during independent exercise;Long Term: Able to check pulse independently and accurately      Understanding of Exercise Prescription  Yes  Yes      Intervention  Provide education, explanation, and written materials on patient's individual exercise prescription  Provide education, explanation, and written materials on patient's individual exercise prescription      Expected Outcomes  Short Term: Able to explain program exercise prescription;Long  Term: Able to explain home exercise prescription to exercise independently  Short Term: Able to explain program exercise prescription;Long Term: Able to explain home exercise prescription to exercise independently         Exercise Goals Re-Evaluation : Exercise Goals Re-Evaluation    Row Name 01/07/19 1059 01/29/19 1658           Exercise Goal Re-Evaluation   Exercise Goals Review  Increase Physical Activity;Increase Strength and Stamina;Understanding of Exercise Prescription  Able to understand and use rate of perceived exertion (RPE) scale;Knowledge and understanding of Target Heart Rate Range (THRR);Understanding of Exercise Prescription      Comments  Jlynn has been exercising some.  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  She aims for at least 30 min every day!!  Overall, she is feeling good and eager to start rehab in the gym and get back on a treadmill again.  Reviewed RPE scale, THR and program prescription with pt today.  Pt voiced understanding and was given a copy of goals to take home.      Expected Outcomes  Short: Continue to at least walk daily.  Long: Get started with rehab in gym.  Short: Use RPE daily to regulate intensity. Long: Follow program prescription in THR.         Discharge Exercise Prescription (Final Exercise Prescription Changes): Exercise Prescription Changes - 01/28/19 1600      Response to Exercise   Blood Pressure (Admit)  118/58    Blood Pressure (Exercise)  146/70    Blood Pressure (Exit)  120/58    Heart Rate (Admit)  104 bpm  Heart Rate (Exercise)  127 bpm    Heart Rate (Exit)  89 bpm    Oxygen Saturation (Admit)  92 %    Oxygen Saturation (Exercise)  97 %    Oxygen Saturation (Exit)  95 %    Rating of Perceived Exertion (Exercise)  11    Perceived Dyspnea (Exercise)  1    Symptoms  --   Patient stopped to rest because she felt tired and thirsty.   Duration  Progress to 30 minutes  of  aerobic without signs/symptoms of physical distress    Intensity  THRR New      Progression   Progression  Continue to progress workloads to maintain intensity without signs/symptoms of physical distress.       Nutrition:  Target Goals: Understanding of nutrition guidelines, daily intake of sodium '1500mg'$ , cholesterol '200mg'$ , calories 30% from fat and 7% or less from saturated fats, daily to have 5 or more servings of fruits and vegetables.  Biometrics:    Nutrition Therapy Plan and Nutrition Goals: Nutrition Therapy & Goals - 01/28/19 1340      Nutrition Therapy   Diet  Low Na, HH, DM diet    Protein (specify units)  60-65g    Fiber  25 grams    Whole Grain Foods  3 servings    Saturated Fats  12 max. grams    Fruits and Vegetables  5 servings/day    Sodium  1.5 grams      Personal Nutrition Goals   Nutrition Goal  ST: Adding snacks in between meals to reduce mindless eating LT: breath without O2 and lose 15 lbs (155-->140 lbs)    Comments  Pt on Lasix. Pt reports eating cheerios with 2% milk (wants to change to almond milk but doesn't like it, talked about soymilk) OR frozen pancakes (every 1-2 weeks) OR oatmeal and decaf coffee (they told her in the hospital not the have caffiene but is unsure if thats still true, told her to call her doctor and ask). Pt will drink diet soda. L chicken salad sandwich on whole wheat (used to be tomato, now cant tolerate tomatoes) with some cheetos (puffed). Will snack on cheetos now in quarentine. D chicken (doesn't like other meat) or pinto bean and broccoli casserole. Will watch her Na and says her dr told her that her numbers are fine. Pt would like to work on the amount of snacks she eats due to boredom ; discussed midful eating and halthy snacking. Discussed HH and DM eating. Discussed increased protein and calorie needs due to her pulmonary issues and maintaining muscle mass. Pt reports sister will shop for her and is trying to get her to buy  [facility] and DM friendly foods, just got her to stop bringing sweets.      Intervention Plan   Intervention  Prescribe, educate and counsel regarding individualized specific dietary modifications aiming towards targeted core components such as weight, hypertension, lipid management, diabetes, heart failure and other comorbidities.;Nutrition handout(s) given to patient.    Expected Outcomes  Short Term Goal: Understand basic principles of dietary content, such as calories, fat, sodium, cholesterol and nutrients.;Short Term Goal: A plan has been developed with personal nutrition goals set during dietitian appointment.;Long Term Goal: Adherence to prescribed nutrition plan.       Nutrition Assessments: Nutrition Assessments - 01/29/19 1128      Rate Your Plate Scores   Pre Score  39       Nutrition Goals Re-Evaluation:  Nutrition Goals Discharge (Final Nutrition Goals Re-Evaluation):   Psychosocial: Target Goals: Acknowledge presence or absence of significant depression and/or stress, maximize coping skills, provide positive support system. Participant is able to verbalize types and ability to use techniques and skills needed for reducing stress and depression.   Initial Review & Psychosocial Screening: Initial Psych Review & Screening - 01/20/19 1052      Initial Review   Current issues with  Current Stress Concerns;Current Sleep Concerns    Source of Stress Concerns  Chronic Illness    Comments  Jamesyn is self isolating away from family and friends. Her MD recently told her not to go back to church because of being high risk. She misses it, but stays in contact with her church friends and her family daily. She reports not sleeping well mainly because of the concentrator's noise level. She is also getting ready to move to a bigger apartment in September which she is really excited about!      Family Dynamics   Good Support System?  Yes      Barriers   Psychosocial barriers to  participate in program  There are no identifiable barriers or psychosocial needs.      Screening Interventions   Interventions  Encouraged to exercise    Expected Outcomes  Long Term Goal: Stressors or current issues are controlled or eliminated.;Short Term goal: Utilizing psychosocial counselor, staff and physician to assist with identification of specific Stressors or current issues interfering with healing process. Setting desired goal for each stressor or current issue identified.;Short Term goal: Identification and review with participant of any Quality of Life or Depression concerns found by scoring the questionnaire.;Long Term goal: The participant improves quality of Life and PHQ9 Scores as seen by post scores and/or verbalization of changes       Quality of Life Scores:   Scores of 19 and below usually indicate a poorer quality of life in these areas.  A difference of  2-3 points is a clinically meaningful difference.  A difference of 2-3 points in the total score of the Quality of Life Index has been associated with significant improvement in overall quality of life, self-image, physical symptoms, and general health in studies assessing change in quality of life.  PHQ-9: Recent Review Flowsheet Data    There is no flowsheet data to display.     Interpretation of Total Score  Total Score Depression Severity:  1-4 = Minimal depression, 5-9 = Mild depression, 10-14 = Moderate depression, 15-19 = Moderately severe depression, 20-27 = Severe depression   Psychosocial Evaluation and Intervention:   Psychosocial Re-Evaluation: Psychosocial Re-Evaluation    [place] Name 01/07/19 1111             Psychosocial Re-Evaluation   Current issues with  Current Stress Concerns       Comments  She is feeling good at home.  She is worried about her younger daughter not wearing a mask.  Her oldest is a Marine scientist and has tested positive for COVID already.  She is worried for her family, but coping the  best she can.       Expected Outcomes  Short: Continue to talk to daughters on phone for connection and get started with rehab.  Long: Continue to stay positive.       Interventions  Encouraged to attend Cardiac Rehabilitation for the exercise       Continue Psychosocial Services   Follow up required by staff  Psychosocial Discharge (Final Psychosocial Re-Evaluation): Psychosocial Re-Evaluation - 01/07/19 1111      Psychosocial Re-Evaluation   Current issues with  Current Stress Concerns    Comments  She is feeling good at home.  She is worried about her younger daughter not wearing a mask.  Her oldest is a Marine scientist and has tested positive for COVID already.  She is worried for her family, but coping the best she can.    Expected Outcomes  Short: Continue to talk to daughters on phone for connection and get started with rehab.  Long: Continue to stay positive.    Interventions  Encouraged to attend Cardiac Rehabilitation for the exercise    Continue Psychosocial Services   Follow up required by staff       Vocational Rehabilitation: Provide vocational rehab assistance to qualifying candidates.   Vocational Rehab Evaluation & Intervention: Vocational Rehab - 01/20/19 1044      Initial Vocational Rehab Evaluation & Intervention   Assessment shows need for Vocational Rehabilitation  No       Education: Education Goals: Education classes will be provided on a variety of topics geared toward better understanding of heart health and risk factor modification. Participant will state understanding/return demonstration of topics presented as noted by education test scores.  Learning Barriers/Preferences: Learning Barriers/Preferences - 01/20/19 1052      Learning Barriers/Preferences   Learning Barriers  None    Learning Preferences  None       Education Topics:  AED/CPR: - Group verbal and written instruction with the use of models to demonstrate the basic use of the AED with  the basic ABC's of resuscitation.   General Nutrition Guidelines/Fats and Fiber: -Group instruction provided by verbal, written material, models and posters to present the general guidelines for heart healthy nutrition. Gives an explanation and review of dietary fats and fiber.   Controlling Sodium/Reading Food Labels: -Group verbal and written material supporting the discussion of sodium use in heart healthy nutrition. Review and explanation with models, verbal and written materials for utilization of the food label.   Exercise Physiology & General Exercise Guidelines: - Group verbal and written instruction with models to review the exercise physiology of the cardiovascular system and associated critical values. Provides general exercise guidelines with specific guidelines to those with heart or lung disease.    Aerobic Exercise & Resistance Training: - Gives group verbal and written instruction on the various components of exercise. Focuses on aerobic and resistive training programs and the benefits of this training and how to safely progress through these programs..   Flexibility, Balance, Mind/Body Relaxation: Provides group verbal/written instruction on the benefits of flexibility and balance training, including mind/body exercise modes such as yoga, pilates and tai chi.  Demonstration and skill practice provided.   Stress and Anxiety: - Provides group verbal and written instruction about the health risks of elevated stress and causes of high stress.  Discuss the correlation between heart/lung disease and anxiety and treatment options. Review healthy ways to manage with stress and anxiety.   Depression: - Provides group verbal and written instruction on the correlation between heart/lung disease and depressed mood, treatment options, and the stigmas associated with seeking treatment.   Anatomy & Physiology of the Heart: - Group verbal and written instruction and models provide  basic cardiac anatomy and physiology, with the coronary electrical and arterial systems. Review of Valvular disease and Heart Failure   Cardiac Procedures: - Group verbal and written instruction to review commonly prescribed  medications for heart disease. Reviews the medication, class of the drug, and side effects. Includes the steps to properly store meds and maintain the prescription regimen. (beta blockers and nitrates)   Cardiac Medications I: - Group verbal and written instruction to review commonly prescribed medications for heart disease. Reviews the medication, class of the drug, and side effects. Includes the steps to properly store meds and maintain the prescription regimen.   Cardiac Medications II: -Group verbal and written instruction to review commonly prescribed medications for heart disease. Reviews the medication, class of the drug, and side effects. (all other drug classes)    Go Sex-Intimacy & Heart Disease, Get SMART - Goal Setting: - Group verbal and written instruction through game format to discuss heart disease and the return to sexual intimacy. Provides group verbal and written material to discuss and apply goal setting through the application of the S.M.A.R.T. Method.   Other Matters of the Heart: - Provides group verbal, written materials and models to describe Stable Angina and Peripheral Artery. Includes description of the disease process and treatment options available to the cardiac patient.   Exercise & Equipment Safety: - Individual verbal instruction and demonstration of equipment use and safety with use of the equipment.   Cardiac Rehab from 01/28/2019 in [facility] Cardiac and Pulmonary Rehab  Date  01/28/19  Educator  [place]  Instruction Review Code  1- Verbalizes Understanding      Infection Prevention: - Provides verbal and written material to individual with discussion of infection control including proper hand washing and proper equipment cleaning during  exercise session.   Cardiac Rehab from 01/28/2019 in [facility] Cardiac and Pulmonary Rehab  Date  01/28/19  Educator  [place]  Instruction Review Code  1- Verbalizes Understanding      Falls Prevention: - Provides verbal and written material to individual with discussion of falls prevention and safety.   Cardiac Rehab from 01/28/2019 in [facility] Cardiac and Pulmonary Rehab  Date  01/28/19  Educator  [place]  Instruction Review Code  1- Verbalizes Understanding      Diabetes: - Individual verbal and written instruction to review signs/symptoms of diabetes, desired ranges of glucose level fasting, after meals and with exercise. Acknowledge that pre and post exercise glucose checks will be done for 3 sessions at entry of program.   Cardiac Rehab from 01/28/2019 in [facility] Cardiac and Pulmonary Rehab  Date  01/28/19  Educator  [place]  Instruction Review Code  1- Verbalizes Understanding      Know Your Numbers and Risk Factors: -Group verbal and written instruction about important numbers in your health.  Discussion of what are risk factors and how they play a role in the disease process.  Review of Cholesterol, Blood Pressure, Diabetes, and BMI and the role they play in your overall health.   Sleep Hygiene: -Provides group verbal and written instruction about how sleep can affect your health.  Define sleep hygiene, discuss sleep cycles and impact of sleep habits. Review good sleep hygiene tips.    Other: -Provides group and verbal instruction on various topics (see comments)   Knowledge Questionnaire Score:   Core Components/Risk Factors/Patient Goals at Admission: Personal Goals and Risk Factors at Admission - 12/17/18 1009      Core Components/Risk Factors/Patient Goals on Admission    Weight Management  Yes;Weight Loss    Intervention  Weight Management: Develop a combined nutrition and exercise program designed to reach desired caloric intake, while maintaining appropriate intake of nutrient and fiber,  sodium and fats, and appropriate energy expenditure required for the weight goal.;Weight Management: Provide education and appropriate resources to help participant work on and attain dietary goals.    Expected Outcomes  Short Term: Continue to assess and modify interventions until short term weight is achieved;Long Term: Adherence to nutrition and physical activity/exercise program aimed toward attainment of established weight goal;Weight Loss: Understanding of general recommendations for a balanced deficit meal plan, which promotes 1-2 lb weight loss per week and includes a negative energy balance of [phone] kcal/d;Understanding recommendations for meals to include 15-35% energy as protein, 25-35% energy from fat, 35-60% energy from carbohydrates, less than '200mg'$  of dietary cholesterol, 20-35 gm of total fiber daily;Understanding of distribution of calorie intake throughout the day with the consumption of 4-5 meals/snacks    Diabetes  Yes    Intervention  Provide education about signs/symptoms and action to take for hypo/hyperglycemia.;Provide education about proper nutrition, including hydration, and aerobic/resistive exercise prescription along with prescribed medications to achieve blood glucose in normal ranges: Fasting glucose 65-99 mg/dL    Expected Outcomes  Short Term: Participant verbalizes understanding of the signs/symptoms and immediate care of hyper/hypoglycemia, proper foot care and importance of medication, aerobic/resistive exercise and nutrition plan for blood glucose control.;Long Term: Attainment of HbA1C < 7%.    Hypertension  Yes    Intervention  Provide education on lifestyle modifcations including regular physical activity/exercise, weight management, moderate sodium restriction and increased consumption of fresh fruit, vegetables, and low fat dairy, alcohol moderation, and smoking cessation.;Monitor prescription use compliance.    Expected Outcomes  Short Term: Continued assessment  and intervention until BP is < 140/80m HG in hypertensive participants. < 130/846mHG in hypertensive participants with diabetes, heart failure or chronic kidney disease.;Long Term: Maintenance of blood pressure at goal levels.    Lipids  Yes    Intervention  Provide education and support for participant on nutrition & aerobic/resistive exercise along with prescribed medications to achieve LDL '70mg'$ , HDL >'40mg'$ .    Expected Outcomes  Long Term: Cholesterol controlled with medications as prescribed, with individualized exercise RX and with personalized nutrition plan. Value goals: LDL < '70mg'$ , HDL > 40 mg.;Short Term: Participant states understanding of desired cholesterol values and is compliant with medications prescribed. Participant is following exercise prescription and nutrition guidelines.       Core Components/Risk Factors/Patient Goals Review:  Goals and Risk Factor Review    Row Name 01/07/19 1112 01/20/19 1046 01/28/19 1612         Core Components/Risk Factors/Patient Goals Review   Personal Goals Review  Weight Management/Obesity;Hypertension  Weight Management/Obesity;Diabetes;Hypertension  Weight Management/Obesity;Diabetes;Hypertension     Review  Her weight and blood pressures have been good and she has not noted any problems.  Her weight is a little up, but slowly. She is keeping an eye on it, but still wants to lose weight. Her current weight is 153lb and she wants to get down to 140 lb. Her blood pressure has been running well. She feels like she is managing her diabetes well.  Her weight is a little up, but slowly. She is keeping an eye on it, but still wants to lose weight. Her current weight is 153lb and she wants to get down to 140 lb. Her blood pressure has been running well. She feels like she is managing her diabetes well.     Expected Outcomes  Short and Long: Continue to monitor risk factors.  Short: come to Cardiac Rehab to learn more about risk  factors. Long: become  independent with managing risk factors.  Short: come to Cardiac Rehab to learn more about risk factors. Long: become independent with managing risk factors.        Core Components/Risk Factors/Patient Goals at Discharge (Final Review):  Goals and Risk Factor Review - 01/28/19 1612      Core Components/Risk Factors/Patient Goals Review   Personal Goals Review  Weight Management/Obesity;Diabetes;Hypertension    Review  Her weight is a little up, but slowly. She is keeping an eye on it, but still wants to lose weight. Her current weight is 153lb and she wants to get down to 140 lb. Her blood pressure has been running well. She feels like she is managing her diabetes well.    Expected Outcomes  Short: come to Cardiac Rehab to learn more about risk factors. Long: become independent with managing risk factors.       ITP Comments: ITP Comments    Row Name 12/10/18 1057 12/17/18 1006 12/25/18 1004 01/07/19 1044 01/20/19 1035   ITP Comments  Initial Visit for Virtual [facility] today. Consent and intake completed. Appt made for EP and RD .  Completed initial ExRx created and sent to Dr. Emily Filbert, Medical Director to review and sign.  [place] contacted [place] via the [employer] App that she needs to hold off on exercise until cleared by her doctor.  Virtual appt completed.  She is feeling better and has been wearing an event monitor for 14 days. She turns it in on Thurs 01/09/19.  She has had a few episodes of "weakness" feeling and would like to wait to actually come in person until after her f/u visit on 01/27/19 with Dr. Sabra Heck.  RN Virtual orientation completed. Diagnosis can be found in CE 2/18. EP/RD scheduled for 8/4   Row Name 01/29/19 1658 02/05/19 0559         ITP Comments  First full day of exercise!  Patient was oriented to gym and equipment including functions, settings, policies, and procedures.  Patient's individual exercise prescription and treatment plan were reviewed.  All  starting workloads were established based on the results of the 6 minute walk test done at initial orientation visit.  The plan for exercise progression was also introduced and progression will be customized based on patient's performance and goals.  30 Day Review Completed today. Continue with ITP unless changed by Medical Director review.         Comments:

## 2019-02-10 ENCOUNTER — Other Ambulatory Visit: Payer: Self-pay

## 2019-02-10 ENCOUNTER — Encounter: Payer: Medicare Other | Admitting: *Deleted

## 2019-02-10 DIAGNOSIS — Z951 Presence of aortocoronary bypass graft: Secondary | ICD-10-CM

## 2019-02-10 NOTE — Progress Notes (Signed)
Daily Session Note  Patient Details  Name: Andrea [name] MRN: [id number] Date of Birth: [birth year] Referring Provider:     Cardiac Rehab from 01/28/2019 in [facility] Cardiac and Pulmonary Rehab  Referring Provider  Chong Sicilian MD      Encounter Date: 02/10/2019  Check In: Session Check In - 02/10/19 1607      Check-In   Supervising physician immediately available to respond to emergencies  See telemetry face sheet for immediately available ER MD    Location  [facility]-Cardiac & Pulmonary Rehab    Staff Present  Heath Lark, RN, BSN, Laveda Norman, BS, ACSM CEP, Exercise Physiologist;Jeanna Durrell BS, Exercise Physiologist    Virtual Visit  No    Medication changes reported      No    Fall or balance concerns reported     No    Warm-up and Cool-down  Performed on first and last piece of equipment    Resistance Training Performed  Yes    VAD Patient?  No    PAD/SET Patient?  No      Pain Assessment   Currently in Pain?  No/denies          Social History   Tobacco Use  Smoking Status Never Smoker  Smokeless Tobacco Never Used    Goals Met:  Independence with exercise equipment Exercise tolerated well No report of cardiac concerns or symptoms Strength training completed today  Goals Unmet:  Not Applicable  Comments: Pt able to follow exercise prescription today without complaint.  Will continue to monitor for progression.    Dr. Emily Filbert is Medical Director for [place] and [name].

## 2019-02-12 ENCOUNTER — Encounter: Payer: Medicare Other | Admitting: *Deleted

## 2019-02-12 ENCOUNTER — Other Ambulatory Visit: Payer: Self-pay

## 2019-02-12 DIAGNOSIS — Z951 Presence of aortocoronary bypass graft: Secondary | ICD-10-CM | POA: Diagnosis not present

## 2019-02-12 NOTE — Progress Notes (Signed)
Daily Session Note  Patient Details  Name: Andrea [name] MRN: [id number] Date of Birth: 1946/03/13 Referring Provider:     Cardiac Rehab from 01/28/2019 in [facility] Cardiac and Pulmonary Rehab  Referring Provider  Chong Sicilian MD      Encounter Date: 02/12/2019  Check In: Session Check In - 02/12/19 1648      Check-In   Supervising physician immediately available to respond to emergencies  See telemetry face sheet for immediately available ER MD    Location  [facility]-Cardiac & Pulmonary Rehab    Staff Present  Heath Lark, RN, BSN, CCRP;Jeanna Durrell BS, Exercise Physiologist;Amanda Oletta Darter, BA, ACSM CEP, Exercise Physiologist    Virtual Visit  No    Medication changes reported      No    Fall or balance concerns reported     No    Warm-up and Cool-down  Performed on first and last piece of equipment    Resistance Training Performed  Yes    VAD Patient?  No    PAD/SET Patient?  No      Pain Assessment   Currently in Pain?  No/denies          Social History   Tobacco Use  Smoking Status Never Smoker  Smokeless Tobacco Never Used    Goals Met:  Independence with exercise equipment Exercise tolerated well No report of cardiac concerns or symptoms Strength training completed today  Goals Unmet:  Not Applicable  Comments: Andrea [name] has been moving. 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   Some SOB and need for rescue inhaler with REC Bike use. Was able to complete exercise session without further problems.    Dr. Emily Filbert is Medical Director for [place] and [name].

## 2019-02-13 DIAGNOSIS — Z951 Presence of aortocoronary bypass graft: Secondary | ICD-10-CM

## 2019-02-13 NOTE — Progress Notes (Signed)
Daily Session Note  Patient Details  Name: Andrea [name] MRN: [id number] Date of Birth: 05/25/1946 Referring Provider:     Cardiac Rehab from 01/28/2019 in [facility] Cardiac and Pulmonary Rehab  Referring Provider  Miller, Paula MD      Encounter Date: 02/13/2019  Check In:      Social History   Tobacco Use  Smoking Status Never Smoker  Smokeless Tobacco Never Used    Goals Met:  Independence with exercise equipment Exercise tolerated well No report of cardiac concerns or symptoms Strength training completed today  Goals Unmet:  Not Applicable  Comments: Pt able to follow exercise prescription today without complaint.  Will continue to monitor for progression.    Dr. Mark Miller is Medical Director for [facility] and [name]. 

## 2019-02-17 ENCOUNTER — Other Ambulatory Visit: Payer: Self-pay

## 2019-02-17 ENCOUNTER — Encounter: Payer: Medicare Other | Admitting: *Deleted

## 2019-02-17 DIAGNOSIS — Z951 Presence of aortocoronary bypass graft: Secondary | ICD-10-CM | POA: Diagnosis not present

## 2019-02-17 NOTE — Progress Notes (Signed)
Daily Session Note  Patient Details  Name: MARIEANN ZIPP MRN: [id number] Date of Birth: [birth year] Referring Provider:     Cardiac Rehab from 01/28/2019 in [facility] Cardiac and Pulmonary Rehab  Referring Provider  Chong Sicilian MD      Encounter Date: 02/17/2019  Check In: Session Check In - 02/17/19 1541      Check-In   Supervising physician immediately available to respond to emergencies  See telemetry face sheet for immediately available ER MD    Location  [facility]-Cardiac & Pulmonary Rehab    Staff Present  Renita Papa, RN Moises Blood, BS, ACSM CEP, Exercise Physiologist;Jeanna Durrell BS, Exercise Physiologist    Virtual Visit  No    Medication changes reported      No    Fall or balance concerns reported     No    Warm-up and Cool-down  Performed on first and last piece of equipment    Resistance Training Performed  Yes    VAD Patient?  No    PAD/SET Patient?  No      Pain Assessment   Currently in Pain?  No/denies          Social History   Tobacco Use  Smoking Status Never Smoker  Smokeless Tobacco Never Used    Goals Met:  Independence with exercise equipment Exercise tolerated well No report of cardiac concerns or symptoms Strength training completed today  Goals Unmet:  Not Applicable  Comments: Pt able to follow exercise prescription today without complaint.  Will continue to monitor for progression.    Dr. Emily Filbert is Medical Director for [place] and [name].

## 2019-02-19 ENCOUNTER — Encounter: Payer: Medicare Other | Admitting: *Deleted

## 2019-02-19 ENCOUNTER — Other Ambulatory Visit: Payer: Self-pay

## 2019-02-19 DIAGNOSIS — Z951 Presence of aortocoronary bypass graft: Secondary | ICD-10-CM

## 2019-02-19 NOTE — Progress Notes (Signed)
Daily Session Note  Patient Details  Name: Andrea [name] MRN: [id number] Date of Birth: [birth year] Referring Provider:     Cardiac Rehab from 01/28/2019 in [facility] Cardiac and Pulmonary Rehab  Referring Provider  Chong Sicilian MD      Encounter Date: 02/19/2019  Check In: Session Check In - 02/19/19 1545      Check-In   Supervising physician immediately available to respond to emergencies  See telemetry face sheet for immediately available ER MD    Location  [facility]-Cardiac & Pulmonary Rehab    Staff Present  Renita Papa, RN Vickki Hearing, BA, ACSM CEP, Exercise Physiologist;Melissa Caiola RDN, LDN    Virtual Visit  No    Medication changes reported      No    Fall or balance concerns reported     No    Warm-up and Cool-down  Performed on first and last piece of equipment    Resistance Training Performed  Yes    VAD Patient?  No    PAD/SET Patient?  No      Pain Assessment   Currently in Pain?  No/denies          Social History   Tobacco Use  Smoking Status Never Smoker  Smokeless Tobacco Never Used    Goals Met:  Independence with exercise equipment Exercise tolerated well No report of cardiac concerns or symptoms Strength training completed today  Goals Unmet:  Not Applicable  Comments: Pt able to follow exercise prescription today without complaint.  Will continue to monitor for progression.    Dr. Emily Filbert is Medical Director for [place] and [name].

## 2019-02-20 ENCOUNTER — Encounter: Payer: Medicare Other | Admitting: *Deleted

## 2019-02-20 DIAGNOSIS — Z951 Presence of aortocoronary bypass graft: Secondary | ICD-10-CM

## 2019-02-20 NOTE — Progress Notes (Signed)
Daily Session Note  Patient Details  Name: Andrea [name] MRN: [id number] Date of Birth: [birth year] Referring Provider:     Cardiac Rehab from 01/28/2019 in [facility] Cardiac and Pulmonary Rehab  Referring Provider  Chong Sicilian MD      Encounter Date: 02/20/2019  Check In: Session Check In - 02/20/19 [place]      Check-In   Supervising physician immediately available to respond to emergencies  See telemetry face sheet for immediately available ER MD    Location  [facility]-Cardiac & Pulmonary Rehab    Staff Present  Renita Papa, RN BSN;Jeanna Durrell BS, Exercise Physiologist;Jessica Mora, MA, RCEP, CCRP, CCET    Virtual Visit  No    Medication changes reported      No    Fall or balance concerns reported     No    Warm-up and Cool-down  Performed on first and last piece of equipment    Resistance Training Performed  Yes    VAD Patient?  No    PAD/SET Patient?  No      Pain Assessment   Currently in Pain?  No/denies          Social History   Tobacco Use  Smoking Status Never Smoker  Smokeless Tobacco Never Used    Goals Met:  Independence with exercise equipment Exercise tolerated well No report of cardiac concerns or symptoms Strength training completed today  Goals Unmet:  Not Applicable  Comments: Pt able to follow exercise prescription today without complaint.  Will continue to monitor for progression.    Dr. Emily Filbert is Medical Director for [place] and [name].

## 2019-02-24 ENCOUNTER — Other Ambulatory Visit: Payer: Self-pay

## 2019-02-24 ENCOUNTER — Encounter: Payer: Medicare Other | Admitting: *Deleted

## 2019-02-24 DIAGNOSIS — Z951 Presence of aortocoronary bypass graft: Secondary | ICD-10-CM | POA: Diagnosis not present

## 2019-02-24 NOTE — Progress Notes (Signed)
Daily Session Note  Patient Details  Name: Andrea [name] MRN: [id number] Date of Birth: 1946/04/15 Referring Provider:     Cardiac Rehab from 01/28/2019 in [facility] Cardiac and Pulmonary Rehab  Referring Provider  Chong Sicilian MD      Encounter Date: 02/24/2019  Check In: Session Check In - 02/24/19 1543      Check-In   Supervising physician immediately available to respond to emergencies  See telemetry face sheet for immediately available ER MD    Location  [facility]-Cardiac & Pulmonary Rehab    Staff Present  Heath Lark, RN, BSN, CCRP;Meredith Sherryll Burger, RN Moises Blood, BS, ACSM CEP, Exercise Physiologist;Jeanna Durrell BS, Exercise Physiologist    Virtual Visit  No    Medication changes reported      No    Fall or balance concerns reported     No    Warm-up and Cool-down  Performed on first and last piece of equipment    Resistance Training Performed  Yes    VAD Patient?  No    PAD/SET Patient?  No      Pain Assessment   Currently in Pain?  No/denies          Social History   Tobacco Use  Smoking Status Never Smoker  Smokeless Tobacco Never Used    Goals Met:  Independence with exercise equipment Exercise tolerated well No report of cardiac concerns or symptoms Strength training completed today  Goals Unmet:  Not Applicable  Comments: Pt able to follow exercise prescription today without complaint.  Will continue to monitor for progression.    Dr. Emily Filbert is Medical Director for [place] and [name].

## 2019-02-26 ENCOUNTER — Other Ambulatory Visit: Payer: Self-pay

## 2019-02-26 ENCOUNTER — Encounter: Payer: Medicare Other | Attending: Cardiology | Admitting: *Deleted

## 2019-02-26 DIAGNOSIS — I252 Old myocardial infarction: Secondary | ICD-10-CM | POA: Diagnosis not present

## 2019-02-26 DIAGNOSIS — Z7901 Long term (current) use of anticoagulants: Secondary | ICD-10-CM | POA: Diagnosis not present

## 2019-02-26 DIAGNOSIS — Z7982 Long term (current) use of aspirin: Secondary | ICD-10-CM | POA: Insufficient documentation

## 2019-02-26 DIAGNOSIS — J449 Chronic obstructive pulmonary disease, unspecified: Secondary | ICD-10-CM | POA: Insufficient documentation

## 2019-02-26 DIAGNOSIS — Z79899 Other long term (current) drug therapy: Secondary | ICD-10-CM | POA: Diagnosis not present

## 2019-02-26 DIAGNOSIS — E118 Type 2 diabetes mellitus with unspecified complications: Secondary | ICD-10-CM | POA: Insufficient documentation

## 2019-02-26 DIAGNOSIS — Z951 Presence of aortocoronary bypass graft: Secondary | ICD-10-CM

## 2019-02-26 DIAGNOSIS — N289 Disorder of kidney and ureter, unspecified: Secondary | ICD-10-CM | POA: Insufficient documentation

## 2019-02-26 NOTE — Progress Notes (Signed)
Daily Session Note  Patient Details  Name: Andrea [name] MRN: [id number] Date of Birth: 12/23/1945 Referring Provider:     Cardiac Rehab from 01/28/2019 in [facility] Cardiac and Pulmonary Rehab  Referring Provider  Chong Sicilian MD      Encounter Date: 02/26/2019  Check In: Session Check In - 02/26/19 1548      Check-In   Supervising physician immediately available to respond to emergencies  See telemetry face sheet for immediately available ER MD    Location  [facility]-Cardiac & Pulmonary Rehab    Staff Present  Hope Budds RDN, LDN;Joseph Foy [place], BA, ACSM CEP, Exercise Physiologist;Susanne Bice, RN, BSN, CCRP;Meredith Sherryll Burger, RN BSN    Virtual Visit  No    Medication changes reported      No    Fall or balance concerns reported     No    Warm-up and Cool-down  Performed on first and last piece of equipment    Resistance Training Performed  Yes    VAD Patient?  No    PAD/SET Patient?  No      Pain Assessment   Currently in Pain?  No/denies          Social History   Tobacco Use  Smoking Status Never Smoker  Smokeless Tobacco Never Used    Goals Met:  Independence with exercise equipment Exercise tolerated well No report of cardiac concerns or symptoms Strength training completed today  Goals Unmet:  Not Applicable  Comments: Pt able to follow exercise prescription today without complaint.  Will continue to monitor for progression.    Dr. Emily Filbert is Medical Director for [place] and [name].

## 2019-02-27 ENCOUNTER — Encounter: Payer: Medicare Other | Admitting: *Deleted

## 2019-02-27 DIAGNOSIS — Z951 Presence of aortocoronary bypass graft: Secondary | ICD-10-CM | POA: Diagnosis not present

## 2019-02-27 NOTE — Progress Notes (Signed)
Daily Session Note  Patient Details  Name: Andrea [name] MRN: [id number] Date of Birth: 1946-06-01 Referring Provider:     Cardiac Rehab from 01/28/2019 in [facility] Cardiac and Pulmonary Rehab  Referring Provider  Chong Sicilian MD      Encounter Date: 02/27/2019  Check In: Session Check In - 02/27/19 1556      Check-In   Supervising physician immediately available to respond to emergencies  See telemetry face sheet for immediately available ER MD    Location  [facility]-Cardiac & Pulmonary Rehab    Staff Present  Renita Papa, RN BSN;Jessica Luan Pulling, MA, RCEP, CCRP, CCET;Jeanna Durrell BS, Exercise Physiologist;Joseph Hood RCP,RRT,BSRT    Virtual Visit  No    Medication changes reported      No    Warm-up and Cool-down  Performed on first and last piece of equipment    Resistance Training Performed  Yes    VAD Patient?  No    PAD/SET Patient?  No      Pain Assessment   Currently in Pain?  No/denies          Social History   Tobacco Use  Smoking Status Never Smoker  Smokeless Tobacco Never Used    Goals Met:  Independence with exercise equipment Exercise tolerated well No report of cardiac concerns or symptoms Strength training completed today  Goals Unmet:  Not Applicable  Comments: Pt able to follow exercise prescription today without complaint.  Will continue to monitor for progression.    Dr. Emily Filbert is Medical Director for [name] and [name].

## 2019-03-05 ENCOUNTER — Encounter: Payer: Medicare Other | Admitting: *Deleted

## 2019-03-05 ENCOUNTER — Other Ambulatory Visit: Payer: Self-pay

## 2019-03-05 ENCOUNTER — Encounter: Payer: Self-pay | Admitting: *Deleted

## 2019-03-05 DIAGNOSIS — Z951 Presence of aortocoronary bypass graft: Secondary | ICD-10-CM

## 2019-03-05 NOTE — Progress Notes (Signed)
Cardiac Individual Treatment Plan  Patient Details  Name: Andrea [name] MRN: [id number] Date of Birth: 1946/05/25 Referring Provider:     Cardiac Rehab from 01/28/2019 in [facility] Cardiac and Pulmonary Rehab  Referring Provider  Chong Sicilian MD      Initial Encounter Date:    Cardiac Rehab from 01/28/2019 in [facility] Cardiac and Pulmonary Rehab  Date  01/28/19      Visit Diagnosis: S/P CABG x 3  Patient's Home Medications on Admission:  Current Outpatient Medications:  .  albuterol (PROVENTIL HFA;VENTOLIN HFA) 108 (90 Base) MCG/ACT inhaler, Inhale 2 puffs into the lungs every 6 (six) hours as needed for wheezing or shortness of breath., Disp: 1 Inhaler, Rfl: 0 .  amLODipine (NORVASC) 10 MG tablet, , Disp: , Rfl:  .  aspirin EC 81 MG tablet, Take 81 mg by mouth daily., Disp: , Rfl:  .  atorvastatin (LIPITOR) 40 MG tablet, Take 40 mg by mouth at bedtime., Disp: , Rfl: 3 .  benzonatate (TESSALON) 100 MG capsule, Take 1 capsule (100 mg total) by mouth 3 (three) times daily. (Patient not taking: Reported on 01/20/2019), Disp: 20 capsule, Rfl: 0 .  budesonide-formoterol (SYMBICORT) 160-4.5 MCG/ACT inhaler, INHALE TWO PUFFS BY MOUTH TWICE DAILY, Disp: , Rfl:  .  chlorpheniramine-HYDROcodone (TUSSIONEX) 10-8 MG/5ML SUER, Take 5 mLs by mouth at bedtime as needed for cough. (Patient not taking: Reported on 12/10/2018), Disp: 115 mL, Rfl: 0 .  Cholecalciferol (VITAMIN D3) 1000 units CAPS, Take 1 capsule by mouth daily., Disp: , Rfl:  .  ELIQUIS 5 MG TABS tablet, , Disp: , Rfl:  .  fluticasone (FLONASE) 50 MCG/ACT nasal spray, 1 spray by Each Nare route daily., Disp: , Rfl:  .  furosemide (LASIX) 20 MG tablet, , Disp: , Rfl:  .  glipiZIDE (GLUCOTROL) 10 MG tablet, TAKE ONE TABLET BY MOUTH TWICE DAILY TAKE  30  MINUTES  BEFORE  A  MEAL*, Disp: , Rfl:  .  JANUVIA 50 MG tablet, , Disp: , Rfl:  .  metoprolol succinate (TOPROL-XL) 25 MG 24 hr tablet, TAKE ONE TABLET BY MOUTH ONCE DAILY, Disp: , Rfl:  .   Multiple Vitamin (MULTI-VITAMINS) TABS, Take 1 tablet by mouth daily., Disp: , Rfl:  .  nitroGLYCERIN (NITROSTAT) 0.4 MG SL tablet, PLACE 1 TABLET UNDER THE TONGUE EVERY 5 MINUTES AS NEEDED FOR CHEST PAIN, Disp: , Rfl:  .  omeprazole (PRILOSEC) 20 MG capsule, Take 1 capsule by mouth daily., Disp: , Rfl:  .  predniSONE (DELTASONE) 10 MG tablet, 4 tabs po daily for two days (Patient not taking: Reported on 12/10/2018), Disp: 8 tablet, Rfl: 0 .  sitaGLIPtin (JANUVIA) 50 MG tablet, Take 50 mg by mouth., Disp: , Rfl:  .  tiotropium (SPIRIVA HANDIHALER) 18 MCG inhalation capsule, INHALE ONE DOSE BY MOUTH ONCE DAILY, Disp: , Rfl:   Past Medical History: Past Medical History:  Diagnosis Date  . COPD (chronic obstructive pulmonary disease) ([place])   . Diabetes mellitus without complication ([place])   . Heart attack ([place])   . Renal disorder     Tobacco Use: Social History   Tobacco Use  Smoking Status Never Smoker  Smokeless Tobacco Never Used    Labs: Recent Review Flowsheet Data    Labs for ITP Cardiac and Pulmonary Rehab Latest Ref Rng & Units 09/21/2017 11/16/2017   Hemoglobin A1c 4.8 - 5.6 % 7.2(H) 7.3(H)       Exercise Target Goals: Exercise Program Goal: Individual exercise prescription set using  results from initial 6 min walk test and THRR while considering  patient's activity barriers and safety.   Exercise Prescription Goal: Initial exercise prescription builds to 30-45 minutes a day of aerobic activity, 2-3 days per week.  Home exercise guidelines will be given to patient during program as part of exercise prescription that the participant will acknowledge.  Activity Barriers & Risk Stratification: Activity Barriers & Cardiac Risk Stratification - 12/17/18 1006      Activity Barriers & Cardiac Risk Stratification   Activity Barriers  Balance Concerns;History of Falls;Assistive Device;Other (comment);Deconditioning;Muscular Weakness    Comments  Currently wearing brace for foot  after concentator fell on it, using walker    Cardiac Risk Stratification  High       6 Minute Walk: 6 Minute Walk    Row Name 01/28/19 1557         6 Minute Walk   Phase  Initial     Distance  660 feet     Walk Time  4.48 minutes     # of Rest Breaks  1     MPH  1.2     METS  2     RPE  11     Perceived Dyspnea   1     VO2 Peak  7     Symptoms  Yes (comment)     Comments  Patient reports taking a break due to being tired and thirsty from wearing a mask.     Resting HR  104 bpm     Resting BP  118/58     Resting Oxygen Saturation   92 %     Exercise Oxygen Saturation  during 6 min walk  97 %     Max Ex. HR  127 bpm     Max Ex. BP  146/70     2 Minute Post BP  120/58        Oxygen Initial Assessment: Oxygen Initial Assessment - 12/17/18 1009      Home Oxygen   Home Oxygen Device  Home Concentrator;E-Tanks;Portable Concentrator    Sleep Oxygen Prescription  Continuous    Liters per minute  2    Home Exercise Oxygen Prescription  Pulsed    Liters per minute  2    Home at Rest Exercise Oxygen Prescription  None    Compliance with Home Oxygen Use  Yes      Program Oxygen Prescription   Program Oxygen Prescription  Portable Concentrator;Pulsed    Liters per minute  2      Intervention   Short Term Goals  To learn and exhibit compliance with exercise, home and travel O2 prescription;To learn and understand importance of monitoring SPO2 with pulse oximeter and demonstrate accurate use of the pulse oximeter.;To learn and understand importance of maintaining oxygen saturations>88%;To learn and demonstrate proper pursed lip breathing techniques or other breathing techniques.;To learn and demonstrate proper use of respiratory medications    Long  Term Goals  Exhibits compliance with exercise, home and travel O2 prescription;Verbalizes importance of monitoring SPO2 with pulse oximeter and return demonstration;Maintenance of O2 saturations>88%;Compliance with respiratory  medication;Exhibits proper breathing techniques, such as pursed lip breathing or other method taught during program session;Demonstrates proper use of MDI's       Oxygen Re-Evaluation: Oxygen Re-Evaluation    Row Name 01/29/19 1700             Program Oxygen Prescription   Program Oxygen Prescription  None;E-Tanks;Continuous  Liters per minute  2         Home Oxygen   Home Oxygen Device  Portable Concentrator;Home Concentrator;E-Tanks       Sleep Oxygen Prescription  Continuous       Liters per minute  2       Home Exercise Oxygen Prescription  Continuous       Liters per minute  2       Home at Rest Exercise Oxygen Prescription  None       Compliance with Home Oxygen Use  Yes         Goals/Expected Outcomes   Short Term Goals  To learn and understand importance of monitoring SPO2 with pulse oximeter and demonstrate accurate use of the pulse oximeter.;To learn and understand importance of maintaining oxygen saturations>88%;To learn and demonstrate proper pursed lip breathing techniques or other breathing techniques.;To learn and exhibit compliance with exercise, home and travel O2 prescription       Long  Term Goals  Compliance with respiratory medication;Exhibits proper breathing techniques, such as pursed lip breathing or other method taught during program session;Maintenance of O2 saturations>88%;Verbalizes importance of monitoring SPO2 with pulse oximeter and return demonstration;Exhibits compliance with exercise, home and travel O2 prescription       Comments  Reviewed PLB technique with pt.  Talked about how it work and it's important to maintaining his exercise saturations.       Goals/Expected Outcomes  Short: Become more profiecient at using PLB.   Long: Become independent at using PLB.          Oxygen Discharge (Final Oxygen Re-Evaluation): Oxygen Re-Evaluation - 01/29/19 1700      Program Oxygen Prescription   Program Oxygen Prescription  None;E-Tanks;Continuous     Liters per minute  2      Home Oxygen   Home Oxygen Device  Portable Concentrator;Home Concentrator;E-Tanks    Sleep Oxygen Prescription  Continuous    Liters per minute  2    Home Exercise Oxygen Prescription  Continuous    Liters per minute  2    Home at Rest Exercise Oxygen Prescription  None    Compliance with Home Oxygen Use  Yes      Goals/Expected Outcomes   Short Term Goals  To learn and understand importance of monitoring SPO2 with pulse oximeter and demonstrate accurate use of the pulse oximeter.;To learn and understand importance of maintaining oxygen saturations>88%;To learn and demonstrate proper pursed lip breathing techniques or other breathing techniques.;To learn and exhibit compliance with exercise, home and travel O2 prescription    Long  Term Goals  Compliance with respiratory medication;Exhibits proper breathing techniques, such as pursed lip breathing or other method taught during program session;Maintenance of O2 saturations>88%;Verbalizes importance of monitoring SPO2 with pulse oximeter and return demonstration;Exhibits compliance with exercise, home and travel O2 prescription    Comments  Reviewed PLB technique with pt.  Talked about how it work and it's important to maintaining his exercise saturations.    Goals/Expected Outcomes  Short: Become more profiecient at using PLB.   Long: Become independent at using PLB.       Initial Exercise Prescription: Initial Exercise Prescription - 01/28/19 1600      Date of Initial Exercise RX and Referring Provider   Date  01/28/19    Referring Provider  Chong Sicilian MD      Oxygen   Oxygen  Continuous    Liters  2      Treadmill  MPH  1.3    Grade  0    Minutes  15    METs  1.2      NuStep   Level  2    SPM  60    Minutes  15    METs  2      Arm Ergometer   Level  1    Watts  15    RPM  10    Minutes  15    METs  1.2      Biostep-RELP   Level  2    SPM  50    Minutes  15    METs  2       Prescription Details   Frequency (times per week)  3    Duration  Progress to 30 minutes of continuous aerobic without signs/symptoms of physical distress      Intensity   THRR 40-80% of Max Heartrate  122-139    Ratings of Perceived Exertion  11-15    Perceived Dyspnea  0-4      Progression   Progression  Continue progressive overload as per policy without signs/symptoms or physical distress.      Resistance Training   Weight  3    Reps  10-15       Perform Capillary Blood Glucose checks as needed.  Exercise Prescription Changes: Exercise Prescription Changes    Row Name 01/28/19 1600 02/14/19 1000 02/25/19 1400         Response to Exercise   Blood Pressure (Admit)  118/58  128/60  132/60     Blood Pressure (Exercise)  146/70  140/60  164/60     Blood Pressure (Exit)  120/58  130/58  130/60     Heart Rate (Admit)  104 bpm  88 bpm  65 bpm     Heart Rate (Exercise)  127 bpm  119 bpm  130 bpm     Heart Rate (Exit)  89 bpm  86 bpm  99 bpm     Oxygen Saturation (Admit)  92 %  -  -     Oxygen Saturation (Exercise)  97 %  -  -     Oxygen Saturation (Exit)  95 %  -  -     Rating of Perceived Exertion (Exercise)  _0 Perceived Dyspnea (Exercise)  1  -  -     Symptoms  - Patient stopped to rest because she felt tired and thirsty.  -  -     Duration  Progress to 30 minutes of  aerobic without signs/symptoms of physical distress  Continue with 30 min of aerobic exercise without signs/symptoms of physical distress.  Continue with 30 min of aerobic exercise without signs/symptoms of physical distress.     Intensity  THRR New  THRR unchanged  THRR unchanged       Progression   Progression  Continue to progress workloads to maintain intensity without signs/symptoms of physical distress.  Continue to progress workloads to maintain intensity without signs/symptoms of physical distress.  Continue to progress workloads to maintain intensity without signs/symptoms of physical  distress.     Average METs  -  -  2.4       Resistance Training   Training Prescription  -  Yes  Yes     Weight  -  3 lb  3 lb     Reps  -  10-15  10-15  Oxygen   Oxygen  -  Continuous  Continuous     Liters  -  2  2       Recumbant Bike   Level  -  2  2     Minutes  -  15  15     METs  -  3.11  3.11       NuStep   Level  -  2  2     SPM  -  60  60     Minutes  -  15  15     METs  -  -  1.9        Exercise Comments: Exercise Comments    Row Name 12/17/18 1016 01/29/19 1658 02/12/19 1651       Exercise Comments  Pt would like to work towards coming off her oxygen  First full day of exercise!  Patient was oriented to gym and equipment including functions, settings, policies, and procedures.  Patient's individual exercise prescription and treatment plan were reviewed.  All starting workloads were established based on the results of the 6 minute walk test done at initial orientation visit.  The plan for exercise progression was also introduced and progression will be customized based on patient's performance and goals.  Perry has been moving. 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   Some SOB and need for rescue inhaler with REC Bike use. Was able to complete exercise session without further problems.        Exercise Goals and Review: Exercise Goals    Row Name 12/17/18 1014 01/28/19 1611           Exercise Goals   Increase Physical Activity  Yes  Yes      Intervention  Provide advice, education, support and counseling about physical activity/exercise needs.;Develop an individualized exercise prescription for aerobic and resistive training based on initial evaluation findings, risk stratification, comorbidities and participant's personal goals.  Provide advice, education, support and counseling about physical activity/exercise needs.;Develop an individualized exercise prescription for aerobic and resistive training based on initial evaluation  findings, risk stratification, comorbidities and participant's personal goals.      Expected Outcomes  Long Term: Add in home exercise to make exercise part of routine and to increase amount of physical activity.;Short Term: Attend rehab on a regular basis to increase amount of physical activity.;Long Term: Exercising regularly at least 3-5 days a week.  Long Term: Add in home exercise to make exercise part of routine and to increase amount of physical activity.;Short Term: Attend rehab on a regular basis to increase amount of physical activity.;Long Term: Exercising regularly at least 3-5 days a week.      Increase Strength and Stamina  Yes  Yes      Intervention  Provide advice, education, support and counseling about physical activity/exercise needs.;Develop an individualized exercise prescription for aerobic and resistive training based on initial evaluation findings, risk stratification, comorbidities and participant's personal goals.  Provide advice, education, support and counseling about physical activity/exercise needs.;Develop an individualized exercise prescription for aerobic and resistive training based on initial evaluation findings, risk stratification, comorbidities and participant's personal goals.      Expected Outcomes  Short Term: Increase workloads from initial exercise prescription for resistance, speed, and METs.;Short Term: Perform resistance training exercises routinely during rehab and add in resistance training at home;Long Term: Improve cardiorespiratory fitness, muscular endurance and strength as measured by increased METs and functional capacity (6MWT)  Short Term: Increase workloads from initial exercise prescription for resistance, speed, and METs.;Short Term: Perform resistance training exercises routinely during rehab and add in resistance training at home;Long Term: Improve cardiorespiratory fitness, muscular endurance and strength as measured by increased METs and functional  capacity (6MWT)      Able to understand and use rate of perceived exertion (RPE) scale  Yes  Yes      Intervention  Provide education and explanation on how to use RPE scale  Provide education and explanation on how to use RPE scale      Expected Outcomes  Short Term: Able to use RPE daily in rehab to express subjective intensity level;Long Term:  Able to use RPE to guide intensity level when exercising independently  Short Term: Able to use RPE daily in rehab to express subjective intensity level;Long Term:  Able to use RPE to guide intensity level when exercising independently      Able to understand and use Dyspnea scale  Yes  Yes      Intervention  Provide education and explanation on how to use Dyspnea scale  Provide education and explanation on how to use Dyspnea scale      Expected Outcomes  Short Term: Able to use Dyspnea scale daily in rehab to express subjective sense of shortness of breath during exertion;Long Term: Able to use Dyspnea scale to guide intensity level when exercising independently  Short Term: Able to use Dyspnea scale daily in rehab to express subjective sense of shortness of breath during exertion;Long Term: Able to use Dyspnea scale to guide intensity level when exercising independently      Knowledge and understanding of Target Heart Rate Range (THRR)  Yes  Yes      Intervention  Provide education and explanation of THRR including how the numbers were predicted and where they are located for reference  Provide education and explanation of THRR including how the numbers were predicted and where they are located for reference      Expected Outcomes  Short Term: Able to state/look up THRR;Short Term: Able to use daily as guideline for intensity in rehab;Long Term: Able to use THRR to govern intensity when exercising independently  Short Term: Able to state/look up THRR;Short Term: Able to use daily as guideline for intensity in rehab;Long Term: Able to use THRR to govern intensity  when exercising independently      Able to check pulse independently  Yes  Yes      Intervention  Provide education and demonstration on how to check pulse in carotid and radial arteries.;Review the importance of being able to check your own pulse for safety during independent exercise  Provide education and demonstration on how to check pulse in carotid and radial arteries.;Review the importance of being able to check your own pulse for safety during independent exercise      Expected Outcomes  Short Term: Able to explain why pulse checking is important during independent exercise;Long Term: Able to check pulse independently and accurately  Short Term: Able to explain why pulse checking is important during independent exercise;Long Term: Able to check pulse independently and accurately      Understanding of Exercise Prescription  Yes  Yes      Intervention  Provide education, explanation, and written materials on patient's individual exercise prescription  Provide education, explanation, and written materials on patient's individual exercise prescription      Expected Outcomes  Short Term: Able to explain program exercise prescription;Long Term: Able  to explain home exercise prescription to exercise independently  Short Term: Able to explain program exercise prescription;Long Term: Able to explain home exercise prescription to exercise independently         Exercise Goals Re-Evaluation : Exercise Goals Re-Evaluation    Row Name 01/07/19 1059 01/29/19 1658 02/14/19 1049 02/20/19 1605 02/25/19 1501     Exercise Goal Re-Evaluation   Exercise Goals Review  Increase Physical Activity;Increase Strength and Stamina;Understanding of Exercise Prescription  Able to understand and use rate of perceived exertion (RPE) scale;Knowledge and understanding of Target Heart Rate Range (THRR);Understanding of Exercise Prescription  Increase Physical Activity;Increase Strength and Stamina;Able to understand and use rate  of perceived exertion (RPE) scale;Knowledge and understanding of Target Heart Rate Range (THRR);Able to check pulse independently;Understanding of Exercise Prescription  Increase Physical Activity;Increase Strength and Stamina;Able to understand and use rate of perceived exertion (RPE) scale;Knowledge and understanding of Target Heart Rate Range (THRR);Able to check pulse independently;Understanding of Exercise Prescription  Increase Physical Activity;Increase Strength and Stamina;Able to understand and use rate of perceived exertion (RPE) scale;Knowledge and understanding of Target Heart Rate Range (THRR);Able to check pulse independently;Understanding of Exercise Prescription   Comments  Daffney has been exercising some.  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  She aims for at least 30 min every day!!  Overall, she is feeling good and eager to start rehab in the gym and get back on a treadmill again.  Reviewed RPE scale, THR and program prescription with pt today.  Pt voiced understanding and was given a copy of goals to take home.  Kensi is doing well with exercise so far.  She works at prescribed RPE/HR ranges. Staff will monitor progress.  Nell enjoys coming to Cardiac Rehab. Patient is very cheerful and is seeing positive. Patient has started driving. Patient was able to put her walker in the car and put oxygen in the car. Patient was unable to do any of that before the program. Patient reports that becoming easier all the time. Increasing workloads on machines weekly. Patient is not as breathless as last week in the program.  Queena is trying the TM for one of her stations.  She wants to get off oxygen.   Expected Outcomes  Short: Continue to at least walk daily.  Long: Get started with rehab in gym.  Short: Use RPE daily to regulate intensity. Long: Follow program prescription in THR.  Short - attend class consistently Long - improve overall  MET level  Short- attending class regularly, walking, and riding bike at home. Long- exercising on days off for a total of 5 days per week  Short - build syamina walking Long - increase overall MET level and get off oxygen      Discharge Exercise Prescription (Final Exercise Prescription Changes): Exercise Prescription Changes - 02/25/19 1400      Response to Exercise   Blood Pressure (Admit)  132/60    Blood Pressure (Exercise)  164/60    Blood Pressure (Exit)  130/60    Heart Rate (Admit)  65 bpm    Heart Rate (Exercise)  130 bpm    Heart Rate (Exit)  99 bpm    Rating of Perceived Exertion (Exercise)  11    Duration  Continue with 30 min of aerobic exercise without signs/symptoms of physical distress.    Intensity  THRR unchanged      Progression   Progression  Continue to progress workloads to maintain intensity without signs/symptoms of physical distress.    Average METs  2.4      Resistance Training   Training Prescription  Yes    Weight  3 lb    Reps  10-15      Oxygen   Oxygen  Continuous    Liters  2      Recumbant Bike   Level  2    Minutes  15    METs  3.11      NuStep   Level  2    SPM  60    Minutes  15    METs  1.9       Nutrition:  Target Goals: Understanding of nutrition guidelines, daily intake of sodium <1543m, cholesterol <2053m calories 30% from fat and 7% or less from saturated fats, daily to have 5 or more servings of fruits and vegetables.  Biometrics:    Nutrition Therapy Plan and Nutrition Goals: Nutrition Therapy & Goals - 01/28/19 1340      Nutrition Therapy   Diet  Low Na, HH, DM diet    Protein (specify units)  60-65g    Fiber  25 grams    Whole Grain Foods  3 servings    Saturated Fats  12 max. grams    Fruits and Vegetables  5 servings/day    Sodium  1.5 grams      Personal Nutrition Goals   Nutrition Goal  ST: Adding snacks in between meals to reduce mindless eating LT: breath without O2 and lose 15 lbs (155-->140 lbs)     Comments  Pt on Lasix. Pt reports eating cheerios with 2% milk (wants to change to almond milk but doesn't like it, talked about soymilk) OR frozen pancakes (every 1-2 weeks) OR oatmeal and decaf coffee (they told her in the hospital not the have caffiene but is unsure if thats still true, told her to call her doctor and ask). Pt will drink diet soda. L chicken salad sandwich on whole wheat (used to be tomato, now cant tolerate tomatoes) with some cheetos (puffed). Will snack on cheetos now in quarentine. D chicken (doesn't like other meat) or pinto bean and broccoli casserole. Will watch her Na and says her dr told her that her numbers are fine. Pt would like to work on the amount of snacks she eats due to boredom ; discussed midful eating and halthy snacking. Discussed HH and DM eating. Discussed increased protein and calorie needs due to her pulmonary issues and maintaining muscle mass. Pt reports sister will shop for her and is trying to get her to [facility] foods, just got her to stop bringing sweets.      Intervention Plan   Intervention  Prescribe, educate and counsel regarding individualized specific dietary modifications aiming towards targeted core components such as weight, hypertension, lipid management, diabetes, heart failure and other comorbidities.;Nutrition handout(s) given to patient.    Expected Outcomes  Short Term Goal: Understand basic principles of dietary content, such as calories, fat, sodium, cholesterol and nutrients.;Short Term Goal: A plan has been developed with personal nutrition goals set during dietitian appointment.;Long Term Goal: Adherence to prescribed nutrition plan.       Nutrition Assessments: Nutrition Assessments - 01/29/19 1128      Rate Your Plate Scores   Pre Score  39       Nutrition Goals Re-Evaluation: Nutrition Goals Re-Evaluation    [place] 02/24/19 [id number]  Goals   Nutrition Goal  ST:  continue healthy snacking and  increase vegetable and fruit intake for now LT: breath without O2 and lose 15 lbs (155-->140 lbs)       Comment  Eating fruit and vegetables as a part of snack and meal. Vegetables like broccoli brussell sprouts, squash, celery, carrots, etc. Fruit apples, tangerines, grapes, bananas. 3 fruit/day, 4 vegetables/day. Pt eating more mindfully peanutbutter and celery. feeling full, doesn't feel as tired, and has regular bowel movements.       Expected Outcome  continue progress with [facility] eating          Nutrition Goals Discharge (Final Nutrition Goals Re-Evaluation): Nutrition Goals Re-Evaluation - 02/24/19 1548      Goals   Nutrition Goal  ST:  continue healthy snacking and increase vegetable and fruit intake for now LT: breath without O2 and lose 15 lbs (155-->140 lbs)    Comment  Eating fruit and vegetables as a part of snack and meal. Vegetables like broccoli brussell sprouts, squash, celery, carrots, etc. Fruit apples, tangerines, grapes, bananas. 3 fruit/day, 4 vegetables/day. Pt eating more mindfully peanutbutter and celery. feeling full, doesn't feel as tired, and has regular bowel movements.    Expected Outcome  continue progress with HH eating       Psychosocial: Target Goals: Acknowledge presence or absence of significant depression and/or stress, maximize coping skills, provide positive support system. Participant is able to verbalize types and ability to use techniques and skills needed for reducing stress and depression.   Initial Review & Psychosocial Screening: Initial Psych Review & Screening - 01/20/19 1052      Initial Review   Current issues with  Current Stress Concerns;Current Sleep Concerns    Source of Stress Concerns  Chronic Illness    Comments  Evelia is self isolating away from family and friends. Her MD recently told her not to go back to church because of being high risk. She misses it, but stays in contact with her church friends and her family daily. She reports  not sleeping well mainly because of the concentrator's noise level. She is also getting ready to move to a bigger apartment in September which she is really excited about!      Family Dynamics   Good Support System?  Yes      Barriers   Psychosocial barriers to participate in program  There are no identifiable barriers or psychosocial needs.      Screening Interventions   Interventions  Encouraged to exercise    Expected Outcomes  Long Term Goal: Stressors or current issues are controlled or eliminated.;Short Term goal: Utilizing psychosocial counselor, staff and physician to assist with identification of specific Stressors or current issues interfering with healing process. Setting desired goal for each stressor or current issue identified.;Short Term goal: Identification and review with participant of any Quality of Life or Depression concerns found by scoring the questionnaire.;Long Term goal: The participant improves quality of Life and PHQ9 Scores as seen by post scores and/or verbalization of changes       Quality of Life Scores:   Scores of 19 and below usually indicate a poorer quality of life in these areas.  A difference of  2-3 points is a clinically meaningful difference.  A difference of 2-3 points in the total score of the Quality of Life Index has been associated with significant improvement in overall quality of life, self-image, physical symptoms, and general health in studies assessing change in quality  of life.  PHQ-9: Recent Review Flowsheet Data    There is no flowsheet data to display.     Interpretation of Total Score  Total Score Depression Severity:  1-4 = Minimal depression, 5-9 = Mild depression, 10-14 = Moderate depression, 15-19 = Moderately severe depression, 20-27 = Severe depression   Psychosocial Evaluation and Intervention:   Psychosocial Re-Evaluation: Psychosocial Re-Evaluation    [place] Name 01/07/19 1111 02/20/19 1546           Psychosocial  Re-Evaluation   Current issues with  Current Stress Concerns  Current Stress Concerns      Comments  She is feeling good at home.  She is worried about her younger daughter not wearing a mask.  Her oldest is a Marine scientist and has tested positive for COVID already.  She is worried for her family, but coping the best she can.  Brinlynn is stressed about moving. She is moving. She is working really hard to move all of her belongings to a bigger apartment. Her oxygen and her walker were too crowded in her old apartment. Patient reports having chest pain when working hard and being stressed and that it went away with rest. Chest pain was a 2 out of 10. Patient was encouraged to ask for help and is going to do so. Patient knows a friend that will help this weekend.      Expected Outcomes  Short: Continue to talk to daughters on phone for connection and get started with rehab.  Long: Continue to stay positive.  Short: Patient will have everything moved by Monday by asking friend for help. Long: Patient will work on conserving energy by working in [employer], sitting when she can to do certain activities, and taking breaks during big house chores as needed. Patient understanding that she needs to find a new way of completing tasks then before procedure.      Interventions  Encouraged to attend Cardiac Rehabilitation for the exercise  -      Continue Psychosocial Services   Follow up required by staff  -         Psychosocial Discharge (Final Psychosocial Re-Evaluation): Psychosocial Re-Evaluation - 02/20/19 1546      Psychosocial Re-Evaluation   Current issues with  Current Stress Concerns    Comments  Aidah is stressed about moving. She is moving. She is working really hard to move all of her belongings to a bigger apartment. Her oxygen and her walker were too crowded in her old apartment. Patient reports having chest pain when working hard and being stressed and that it went away with rest. Chest pain was a 2  out of 10. Patient was encouraged to ask for help and is going to do so. Patient knows a friend that will help this weekend.    Expected Outcomes  Short: Patient will have everything moved by Monday by asking friend for help. Long: Patient will work on conserving energy by working in [employer], sitting when she can to do certain activities, and taking breaks during big house chores as needed. Patient understanding that she needs to find a new way of completing tasks then before procedure.       Vocational Rehabilitation: Provide vocational rehab assistance to qualifying candidates.   Vocational Rehab Evaluation & Intervention: Vocational Rehab - 01/20/19 1044      Initial Vocational Rehab Evaluation & Intervention   Assessment shows need for Vocational Rehabilitation  No       Education: Education  Goals: Education classes will be provided on a variety of topics geared toward better understanding of heart health and risk factor modification. Participant will state understanding/return demonstration of topics presented as noted by education test scores.  Learning Barriers/Preferences: Learning Barriers/Preferences - 01/20/19 1052      Learning Barriers/Preferences   Learning Barriers  None    Learning Preferences  None       Education Topics:  AED/CPR: - Group verbal and written instruction with the use of models to demonstrate the basic use of the AED with the basic ABC's of resuscitation.   General Nutrition Guidelines/Fats and Fiber: -Group instruction provided by verbal, written material, models and posters to present the general guidelines for heart healthy nutrition. Gives an explanation and review of dietary fats and fiber.   Controlling Sodium/Reading Food Labels: -Group verbal and written material supporting the discussion of sodium use in heart healthy nutrition. Review and explanation with models, verbal and written materials for utilization of the food  label.   Exercise Physiology & General Exercise Guidelines: - Group verbal and written instruction with models to review the exercise physiology of the cardiovascular system and associated critical values. Provides general exercise guidelines with specific guidelines to those with heart or lung disease.    Aerobic Exercise & Resistance Training: - Gives group verbal and written instruction on the various components of exercise. Focuses on aerobic and resistive training programs and the benefits of this training and how to safely progress through these programs..   Flexibility, Balance, Mind/Body Relaxation: Provides group verbal/written instruction on the benefits of flexibility and balance training, including mind/body exercise modes such as yoga, pilates and tai chi.  Demonstration and skill practice provided.   Stress and Anxiety: - Provides group verbal and written instruction about the health risks of elevated stress and causes of high stress.  Discuss the correlation between heart/lung disease and anxiety and treatment options. Review healthy ways to manage with stress and anxiety.   Depression: - Provides group verbal and written instruction on the correlation between heart/lung disease and depressed mood, treatment options, and the stigmas associated with seeking treatment.   Anatomy & Physiology of the Heart: - Group verbal and written instruction and models provide basic cardiac anatomy and physiology, with the coronary electrical and arterial systems. Review of Valvular disease and Heart Failure   Cardiac Procedures: - Group verbal and written instruction to review commonly prescribed medications for heart disease. Reviews the medication, class of the drug, and side effects. Includes the steps to properly store meds and maintain the prescription regimen. (beta blockers and nitrates)   Cardiac Medications I: - Group verbal and written instruction to review commonly  prescribed medications for heart disease. Reviews the medication, class of the drug, and side effects. Includes the steps to properly store meds and maintain the prescription regimen.   Cardiac Medications II: -Group verbal and written instruction to review commonly prescribed medications for heart disease. Reviews the medication, class of the drug, and side effects. (all other drug classes)    Go Sex-Intimacy & Heart Disease, Get SMART - Goal Setting: - Group verbal and written instruction through game format to discuss heart disease and the return to sexual intimacy. Provides group verbal and written material to discuss and apply goal setting through the application of the S.M.A.R.T. Method.   Other Matters of the Heart: - Provides group verbal, written materials and models to describe Stable Angina and Peripheral Artery. Includes description of the disease process and treatment  options available to the cardiac patient.   Exercise & Equipment Safety: - Individual verbal instruction and demonstration of equipment use and safety with use of the equipment.   Cardiac Rehab from 01/28/2019 in [facility] Cardiac and Pulmonary Rehab  Date  01/28/19  Educator  [place]  Instruction Review Code  1- Verbalizes Understanding      Infection Prevention: - Provides verbal and written material to individual with discussion of infection control including proper hand washing and proper equipment cleaning during exercise session.   Cardiac Rehab from 01/28/2019 in [facility] Cardiac and Pulmonary Rehab  Date  01/28/19  Educator  [place]  Instruction Review Code  1- Verbalizes Understanding      Falls Prevention: - Provides verbal and written material to individual with discussion of falls prevention and safety.   Cardiac Rehab from 01/28/2019 in [facility] Cardiac and Pulmonary Rehab  Date  01/28/19  Educator  [place]  Instruction Review Code  1- Verbalizes Understanding      Diabetes: - Individual verbal and written  instruction to review signs/symptoms of diabetes, desired ranges of glucose level fasting, after meals and with exercise. Acknowledge that pre and post exercise glucose checks will be done for 3 sessions at entry of program.   Cardiac Rehab from 01/28/2019 in [facility] Cardiac and Pulmonary Rehab  Date  01/28/19  Educator  [place]  Instruction Review Code  1- Verbalizes Understanding      Know Your Numbers and Risk Factors: -Group verbal and written instruction about important numbers in your health.  Discussion of what are risk factors and how they play a role in the disease process.  Review of Cholesterol, Blood Pressure, Diabetes, and BMI and the role they play in your overall health.   Sleep Hygiene: -Provides group verbal and written instruction about how sleep can affect your health.  Define sleep hygiene, discuss sleep cycles and impact of sleep habits. Review good sleep hygiene tips.    Other: -Provides group and verbal instruction on various topics (see comments)   Knowledge Questionnaire Score:   Core Components/Risk Factors/Patient Goals at Admission: Personal Goals and Risk Factors at Admission - 12/17/18 1009      Core Components/Risk Factors/Patient Goals on Admission    Weight Management  Yes;Weight Loss    Intervention  Weight Management: Develop a combined nutrition and exercise program designed to reach desired caloric intake, while maintaining appropriate intake of nutrient and fiber, sodium and fats, and appropriate energy expenditure required for the weight goal.;Weight Management: Provide education and appropriate resources to help participant work on and attain dietary goals.    Expected Outcomes  Short Term: Continue to assess and modify interventions until short term weight is achieved;Long Term: Adherence to nutrition and physical activity/exercise program aimed toward attainment of established weight goal;Weight Loss: Understanding of general recommendations for a balanced  deficit meal plan, which promotes 1-2 lb weight loss per week and includes a negative energy balance of [phone] kcal/d;Understanding recommendations for meals to include 15-35% energy as protein, 25-35% energy from fat, 35-60% energy from carbohydrates, less than 222m of dietary cholesterol, 20-35 gm of total fiber daily;Understanding of distribution of calorie intake throughout the day with the consumption of 4-5 meals/snacks    Diabetes  Yes    Intervention  Provide education about signs/symptoms and action to take for hypo/hyperglycemia.;Provide education about proper nutrition, including hydration, and aerobic/resistive exercise prescription along with prescribed medications to achieve blood glucose in normal ranges: Fasting glucose 65-99 mg/dL    Expected Outcomes  Short Term: Participant verbalizes understanding of the signs/symptoms and immediate care of hyper/hypoglycemia, proper foot care and importance of medication, aerobic/resistive exercise and nutrition plan for blood glucose control.;Long Term: Attainment of HbA1C < 7%.    Hypertension  Yes    Intervention  Provide education on lifestyle modifcations including regular physical activity/exercise, weight management, moderate sodium restriction and increased consumption of fresh fruit, vegetables, and low fat dairy, alcohol moderation, and smoking cessation.;Monitor prescription use compliance.    Expected Outcomes  Short Term: Continued assessment and intervention until BP is < 140/62m HG in hypertensive participants. < 130/821mHG in hypertensive participants with diabetes, heart failure or chronic kidney disease.;Long Term: Maintenance of blood pressure at goal levels.    Lipids  Yes    Intervention  Provide education and support for participant on nutrition & aerobic/resistive exercise along with prescribed medications to achieve LDL <7025mHDL >79m53m  Expected Outcomes  Long Term: Cholesterol controlled with medications as prescribed,  with individualized exercise RX and with personalized nutrition plan. Value goals: LDL < 70mg72mL > 40 mg.;Short Term: Participant states understanding of desired cholesterol values and is compliant with medications prescribed. Participant is following exercise prescription and nutrition guidelines.       Core Components/Risk Factors/Patient Goals Review:  Goals and Risk Factor Review    Row Name 01/07/19 1112 01/20/19 1046 01/28/19 1612 02/20/19 1554       Core Components/Risk Factors/Patient Goals Review   Personal Goals Review  Weight Management/Obesity;Hypertension  Weight Management/Obesity;Diabetes;Hypertension  Weight Management/Obesity;Diabetes;Hypertension  Weight Management/Obesity;Diabetes;Hypertension;Other    Review  Her weight and blood pressures have been good and she has not noted any problems.  Her weight is a little up, but slowly. She is keeping an eye on it, but still wants to lose weight. Her current weight is 153lb and she wants to get down to 140 lb. Her blood pressure has been running well. She feels like she is managing her diabetes well.  Her weight is a little up, but slowly. She is keeping an eye on it, but still wants to lose weight. Her current weight is 153lb and she wants to get down to 140 lb. Her blood pressure has been running well. She feels like she is managing her diabetes well.  Patient reports no weight loss but that her clothes feel less tight and she thinks that she is firming up. Patient is determined to lose weight. Patient does have a blood pressure cuff at home and checks it once daily. BP has been elevated some when entering Cardiac Rehab but fine for exit BP. Patient checks blood glucose every morning and sometimes at night. Fasting blood sugar is in the normal range and night time blood sugar is never over 200 mg/dL. Patient takes all of her medications the way the doctor has prescribed them. Patient said the doctor is taking her off of blood thinners this  month.Patient really wants to become healthy enough to stop wearing oxygen.    Expected Outcomes  Short and Long: Continue to monitor risk factors.  Short: come to Cardiac Rehab to learn more about risk factors. Long: become independent with managing risk factors.  Short: come to Cardiac Rehab to learn more about risk factors. Long: become independent with managing risk factors.  Short: Patient wants to continue becoming stronger, lose weight, and needing less oxygen. Long: Patient wants to stop or decrease wearing oxygen by increasing her lung function with exercise.       Core  Components/Risk Factors/Patient Goals at Discharge (Final Review):  Goals and Risk Factor Review - 02/20/19 1554      Core Components/Risk Factors/Patient Goals Review   Personal Goals Review  Weight Management/Obesity;Diabetes;Hypertension;Other    Review  Patient reports no weight loss but that her clothes feel less tight and she thinks that she is firming up. Patient is determined to lose weight. Patient does have a blood pressure cuff at home and checks it once daily. BP has been elevated some when entering Cardiac Rehab but fine for exit BP. Patient checks blood glucose every morning and sometimes at night. Fasting blood sugar is in the normal range and night time blood sugar is never over 200 mg/dL. Patient takes all of her medications the way the doctor has prescribed them. Patient said the doctor is taking her off of blood thinners this month.Patient really wants to become healthy enough to stop wearing oxygen.    Expected Outcomes  Short: Patient wants to continue becoming stronger, lose weight, and needing less oxygen. Long: Patient wants to stop or decrease wearing oxygen by increasing her lung function with exercise.       ITP Comments: ITP Comments    Row Name 12/10/18 1057 12/17/18 1006 12/25/18 1004 01/07/19 1044 01/20/19 1035   ITP Comments  Initial Visit for Virtual [facility] today. Consent and  intake completed. Appt made for EP and RD .  Completed initial ExRx created and sent to Dr. Emily Filbert, Medical Director to review and sign.  Anelise contacted [place] via the [employer] App that she needs to hold off on exercise until cleared by her doctor.  Virtual appt completed.  She is feeling better and has been wearing an event monitor for 14 days. She turns it in on Thurs 01/09/19.  She has had a few episodes of "weakness" feeling and would like to wait to actually come in person until after her f/u visit on 01/27/19 with Dr. Sabra Heck.  RN Virtual orientation completed. Diagnosis can be found in CE 2/18. EP/RD scheduled for 8/4   Row Name 01/29/19 1658 02/05/19 0559 02/12/19 1650 03/05/19 0607     ITP Comments  First full day of exercise!  Patient was oriented to gym and equipment including functions, settings, policies, and procedures.  Patient's individual exercise prescription and treatment plan were reviewed.  All starting workloads were established based on the results of the 6 minute walk test done at initial orientation visit.  The plan for exercise progression was also introduced and progression will be customized based on patient's performance and goals.  30 Day Review Completed today. Continue with ITP unless changed by Medical Director review.  Kaydra has been moving. 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   Some SOB and need for rescue inhaler with REC Bike use. Was able to complete exercise session without further problems.  30 Day review. Continue with ITP unless directed changes per Medical Director review.       Comments:

## 2019-03-05 NOTE — Progress Notes (Signed)
Daily Session Note  Patient Details  Name: Andrea [name] MRN: [id number] Date of Birth: 1945/06/27 Referring Provider:     Cardiac Rehab from 01/28/2019 in [facility] Cardiac and Pulmonary Rehab  Referring Provider  Chong Sicilian MD      Encounter Date: 03/05/2019  Check In: Session Check In - 03/05/19 1549      Check-In   Supervising physician immediately available to respond to emergencies  See telemetry face sheet for immediately available ER MD    Location  [facility]-Cardiac & Pulmonary Rehab    Staff Present  Renita Papa, RN BSN;Melissa Caiola RDN, Rowe Pavy, BA, ACSM CEP, Exercise Physiologist    Virtual Visit  No    Medication changes reported      No    Fall or balance concerns reported     No    Warm-up and Cool-down  Performed on first and last piece of equipment    Resistance Training Performed  Yes    VAD Patient?  No    PAD/SET Patient?  No      Pain Assessment   Currently in Pain?  No/denies          Social History   Tobacco Use  Smoking Status Never Smoker  Smokeless Tobacco Never Used    Goals Met:  Independence with exercise equipment Exercise tolerated well No report of cardiac concerns or symptoms Strength training completed today  Goals Unmet:  Not Applicable  Comments: Pt able to follow exercise prescription today without complaint.  Will continue to monitor for progression.    Dr. Emily Filbert is Medical Director for [place] and [name].

## 2019-03-06 ENCOUNTER — Encounter: Payer: Medicare Other | Admitting: *Deleted

## 2019-03-06 DIAGNOSIS — Z951 Presence of aortocoronary bypass graft: Secondary | ICD-10-CM | POA: Diagnosis not present

## 2019-03-06 NOTE — Progress Notes (Signed)
Daily Session Note  Patient Details  Name: Andrea [name] MRN: [id number] Date of Birth: 1946/02/08 Referring Provider:     Cardiac Rehab from 01/28/2019 in [facility] Cardiac and Pulmonary Rehab  Referring Provider  Chong Sicilian MD      Encounter Date: 03/06/2019  Check In: Session Check In - 03/06/19 [place]      Check-In   Supervising physician immediately available to respond to emergencies  See telemetry face sheet for immediately available ER MD    Location  [facility]-Cardiac & Pulmonary Rehab    Staff Present  Renita Papa, RN BSN;Jessica Luan Pulling, MA, RCEP, CCRP, CCET;Joseph Hood RCP,RRT,BSRT    Virtual Visit  No    Medication changes reported      No    Fall or balance concerns reported     No    Warm-up and Cool-down  Performed on first and last piece of equipment    Resistance Training Performed  Yes    VAD Patient?  No    PAD/SET Patient?  No      Pain Assessment   Currently in Pain?  No/denies          Social History   Tobacco Use  Smoking Status Never Smoker  Smokeless Tobacco Never Used    Goals Met:  Independence with exercise equipment Exercise tolerated well No report of cardiac concerns or symptoms Strength training completed today  Goals Unmet:  Not Applicable  Comments: Pt able to follow exercise prescription today without complaint.  Will continue to monitor for progression.    Dr. Emily Filbert is Medical Director for [place] and [name].

## 2019-03-10 ENCOUNTER — Other Ambulatory Visit: Payer: Self-pay

## 2019-03-10 ENCOUNTER — Encounter: Payer: Medicare Other | Admitting: *Deleted

## 2019-03-10 DIAGNOSIS — Z951 Presence of aortocoronary bypass graft: Secondary | ICD-10-CM

## 2019-03-10 NOTE — Progress Notes (Signed)
Daily Session Note  Patient Details  Name: INETTA DICKE MRN: [id number] Date of Birth: May 13, 1946 Referring Provider:     Cardiac Rehab from 01/28/2019 in [facility] Cardiac and Pulmonary Rehab  Referring Provider  Chong Sicilian MD      Encounter Date: 03/10/2019  Check In: Session Check In - 03/10/19 1531      Check-In   Supervising physician immediately available to respond to emergencies  See telemetry face sheet for immediately available ER MD    Location  [facility]-Cardiac & Pulmonary Rehab    Staff Present  Renita Papa, RN BSN;Jessica Bell, MA, RCEP, CCRP, [place], BS, ACSM CEP, Exercise Physiologist    Virtual Visit  No    Medication changes reported      No    Fall or balance concerns reported     No    Warm-up and Cool-down  Performed on first and last piece of equipment    Resistance Training Performed  Yes    VAD Patient?  No    PAD/SET Patient?  No      Pain Assessment   Currently in Pain?  No/denies          Social History   Tobacco Use  Smoking Status Never Smoker  Smokeless Tobacco Never Used    Goals Met:  Independence with exercise equipment Exercise tolerated well No report of cardiac concerns or symptoms Strength training completed today  Goals Unmet:  Not Applicable  Comments: Pt able to follow exercise prescription today without complaint.  Will continue to monitor for progression.    Dr. Emily Filbert is Medical Director for [place] and [name].

## 2019-03-12 ENCOUNTER — Encounter: Payer: Medicare Other | Admitting: *Deleted

## 2019-03-12 ENCOUNTER — Other Ambulatory Visit: Payer: Self-pay

## 2019-03-12 DIAGNOSIS — Z951 Presence of aortocoronary bypass graft: Secondary | ICD-10-CM

## 2019-03-12 NOTE — Progress Notes (Signed)
Daily Session Note  Patient Details  Name: Andrea [name] MRN: [id number] Date of Birth: Sep 06, 1945 Referring Provider:     Cardiac Rehab from 01/28/2019 in [facility] Cardiac and Pulmonary Rehab  Referring Provider  Chong Sicilian MD      Encounter Date: 03/12/2019  Check In: Session Check In - 03/12/19 1553      Check-In   Supervising physician immediately available to respond to emergencies  See telemetry face sheet for immediately available ER MD    Location  [facility]-Cardiac & Pulmonary Rehab    Staff Present  Renita Papa, RN Vickki Hearing, BA, ACSM CEP, Exercise Physiologist;Melissa Caiola RDN, LDN    Virtual Visit  No    Medication changes reported      No    Fall or balance concerns reported     No    Warm-up and Cool-down  Performed on first and last piece of equipment    Resistance Training Performed  Yes    VAD Patient?  No    PAD/SET Patient?  No      Pain Assessment   Currently in Pain?  No/denies          Social History   Tobacco Use  Smoking Status Never Smoker  Smokeless Tobacco Never Used    Goals Met:  Independence with exercise equipment Exercise tolerated well No report of cardiac concerns or symptoms Strength training completed today  Goals Unmet:  Not Applicable  Comments: Pt able to follow exercise prescription today without complaint.  Will continue to monitor for progression.    Dr. Emily Filbert is Medical Director for [place] and [name].

## 2019-03-17 ENCOUNTER — Other Ambulatory Visit: Payer: Self-pay

## 2019-03-17 ENCOUNTER — Encounter: Payer: Medicare Other | Admitting: *Deleted

## 2019-03-17 DIAGNOSIS — Z951 Presence of aortocoronary bypass graft: Secondary | ICD-10-CM | POA: Diagnosis not present

## 2019-03-17 NOTE — Progress Notes (Signed)
Daily Session Note  Patient Details  Name: Andrea [name] MRN: [id number] Date of Birth: 1945-08-09 Referring Provider:     Cardiac Rehab from 01/28/2019 in [facility] Cardiac and Pulmonary Rehab  Referring Provider  Chong Sicilian MD      Encounter Date: 03/17/2019  Check In: Session Check In - 03/17/19 1539      Check-In   Supervising physician immediately available to respond to emergencies  See telemetry face sheet for immediately available ER MD    Location  [facility]-Cardiac & Pulmonary Rehab    Staff Present  Heath Lark, RN, BSN, CCRP;Meredith Sherryll Burger, RN BSN;Joseph [address] [place], [place], ACSM CEP, Exercise Physiologist    Virtual Visit  No    Medication changes reported      No    Fall or balance concerns reported     No    Warm-up and Cool-down  Performed on first and last piece of equipment    Resistance Training Performed  Yes    VAD Patient?  No    PAD/SET Patient?  No          Social History   Tobacco Use  Smoking Status Never Smoker  Smokeless Tobacco Never Used    Goals Met:  Independence with exercise equipment Exercise tolerated well No report of cardiac concerns or symptoms  Goals Unmet:  Not Applicable  Comments: Pt able to follow exercise prescription today without complaint.  Will continue to monitor for progression.    Dr. Emily Filbert is Medical Director for [place] and [name].

## 2019-03-19 ENCOUNTER — Encounter: Payer: Medicare Other | Admitting: *Deleted

## 2019-03-19 ENCOUNTER — Other Ambulatory Visit: Payer: Self-pay

## 2019-03-19 DIAGNOSIS — Z951 Presence of aortocoronary bypass graft: Secondary | ICD-10-CM | POA: Diagnosis not present

## 2019-03-19 NOTE — Progress Notes (Signed)
Daily Session Note  Patient Details  Name: Andrea [name] MRN: [id number] Date of Birth: 18-Jun-1946 Referring Provider:     Cardiac Rehab from 01/28/2019 in [facility] Cardiac and Pulmonary Rehab  Referring Provider  Chong Sicilian MD      Encounter Date: 03/19/2019  Check In: Session Check In - 03/19/19 1551      Check-In   Supervising physician immediately available to respond to emergencies  See telemetry face sheet for immediately available ER MD    Location  [facility]-Cardiac & Pulmonary Rehab    Staff Present  Renita Papa, RN Vickki Hearing, BA, ACSM CEP, Exercise Physiologist    Virtual Visit  No    Medication changes reported      No    Fall or balance concerns reported     No    Warm-up and Cool-down  Performed on first and last piece of equipment    Resistance Training Performed  Yes    VAD Patient?  No    PAD/SET Patient?  No      Pain Assessment   Currently in Pain?  No/denies          Social History   Tobacco Use  Smoking Status Never Smoker  Smokeless Tobacco Never Used    Goals Met:  Independence with exercise equipment Exercise tolerated well No report of cardiac concerns or symptoms Strength training completed today  Goals Unmet:  Not Applicable  Comments: Pt able to follow exercise prescription today without complaint.  Will continue to monitor for progression.    Dr. Emily Filbert is Medical Director for [place] and [name].

## 2019-03-24 ENCOUNTER — Other Ambulatory Visit: Payer: Self-pay

## 2019-03-24 ENCOUNTER — Encounter: Payer: Medicare Other | Admitting: *Deleted

## 2019-03-24 DIAGNOSIS — Z951 Presence of aortocoronary bypass graft: Secondary | ICD-10-CM | POA: Diagnosis not present

## 2019-03-24 NOTE — Progress Notes (Signed)
Daily Session Note  Patient Details  Name: Andrea [name] MRN: [id number] Date of Birth: 03/18/1946 Referring Provider:     Cardiac Rehab from 01/28/2019 in [facility] Cardiac and Pulmonary Rehab  Referring Provider  Chong Sicilian MD      Encounter Date: 03/24/2019  Check In: Session Check In - 03/24/19 1541      Check-In   Supervising physician immediately available to respond to emergencies  See telemetry face sheet for immediately available ER MD    Location  [facility]-Cardiac & Pulmonary Rehab    Staff Present  Renita Papa, RN BSN;Joseph [address] [place], [place], ACSM CEP, Exercise Physiologist    Virtual Visit  No    Medication changes reported      No    Fall or balance concerns reported     No    Warm-up and Cool-down  Performed on first and last piece of equipment    Resistance Training Performed  Yes    VAD Patient?  No    PAD/SET Patient?  No      Pain Assessment   Currently in Pain?  No/denies          Social History   Tobacco Use  Smoking Status Never Smoker  Smokeless Tobacco Never Used    Goals Met:  Independence with exercise equipment Exercise tolerated well No report of cardiac concerns or symptoms Strength training completed today  Goals Unmet:  Not Applicable  Comments: Pt able to follow exercise prescription today without complaint.  Will continue to monitor for progression.    Dr. Emily Filbert is Medical Director for [place] and [name].

## 2019-03-26 ENCOUNTER — Other Ambulatory Visit: Payer: Self-pay

## 2019-03-26 ENCOUNTER — Encounter: Payer: Medicare Other | Admitting: *Deleted

## 2019-03-26 DIAGNOSIS — Z951 Presence of aortocoronary bypass graft: Secondary | ICD-10-CM

## 2019-03-26 NOTE — Progress Notes (Signed)
Daily Session Note  Patient Details  Name: Andrea [name] MRN: [id number] Date of Birth: 07-May-1946 Referring Provider:     Cardiac Rehab from 01/28/2019 in [facility] Cardiac and Pulmonary Rehab  Referring Provider  Chong Sicilian MD      Encounter Date: 03/26/2019  Check In: Session Check In - 03/26/19 1527      Check-In   Supervising physician immediately available to respond to emergencies  See telemetry face sheet for immediately available ER MD    Location  [facility]-Cardiac & Pulmonary Rehab    Staff Present  Justin Mend RCP,RRT,BSRT;Jessica [place], MA, RCEP, CCRP, [place]indy [place], [place], ACSM CEP, Exercise Physiologist;Meredith Sherryll Burger, RN BSN    Virtual Visit  No    Medication changes reported      No    Fall or balance concerns reported     No    Warm-up and Cool-down  Performed on first and last piece of equipment    Resistance Training Performed  Yes    VAD Patient?  No    PAD/SET Patient?  No      Pain Assessment   Currently in Pain?  No/denies          Social History   Tobacco Use  Smoking Status Never Smoker  Smokeless Tobacco Never Used    Goals Met:  Independence with exercise equipment Using PLB without cueing & demonstrates good technique Exercise tolerated well Personal goals reviewed No report of cardiac concerns or symptoms Strength training completed today  Goals Unmet:  Not Applicable  Comments: Pt able to follow exercise prescription today without complaint.  Will continue to monitor for progression.    Dr. Emily Filbert is Medical Director for [place] and [name].

## 2019-03-27 ENCOUNTER — Encounter: Payer: Medicare Other | Attending: Cardiology | Admitting: *Deleted

## 2019-03-27 DIAGNOSIS — E118 Type 2 diabetes mellitus with unspecified complications: Secondary | ICD-10-CM | POA: Insufficient documentation

## 2019-03-27 DIAGNOSIS — J449 Chronic obstructive pulmonary disease, unspecified: Secondary | ICD-10-CM | POA: Diagnosis not present

## 2019-03-27 DIAGNOSIS — I252 Old myocardial infarction: Secondary | ICD-10-CM | POA: Insufficient documentation

## 2019-03-27 DIAGNOSIS — Z7982 Long term (current) use of aspirin: Secondary | ICD-10-CM | POA: Insufficient documentation

## 2019-03-27 DIAGNOSIS — Z951 Presence of aortocoronary bypass graft: Secondary | ICD-10-CM

## 2019-03-27 DIAGNOSIS — Z79899 Other long term (current) drug therapy: Secondary | ICD-10-CM | POA: Insufficient documentation

## 2019-03-27 DIAGNOSIS — Z7901 Long term (current) use of anticoagulants: Secondary | ICD-10-CM | POA: Insufficient documentation

## 2019-03-27 DIAGNOSIS — N289 Disorder of kidney and ureter, unspecified: Secondary | ICD-10-CM | POA: Diagnosis not present

## 2019-03-27 NOTE — Progress Notes (Signed)
Daily Session Note  Patient Details  Name: Andrea [name] MRN: [id number] Date of Birth: 14-Jun-1946 Referring Provider:     Cardiac Rehab from 01/28/2019 in [facility] Cardiac and Pulmonary Rehab  Referring Provider  Chong Sicilian MD      Encounter Date: 03/27/2019  Check In: Session Check In - 03/27/19 1542      Check-In   Supervising physician immediately available to respond to emergencies  See telemetry face sheet for immediately available ER MD    Location  [facility]-Cardiac & Pulmonary Rehab    Staff Present  Renita Papa, RN BSN;Jessica [place], MA, RCEP, CCRP, CCET;Amanda Sommer, [place], ACSM CEP, Exercise Physiologist;Joseph Hood RCP,RRT,BSRT    Virtual Visit  No    Medication changes reported      No    Fall or balance concerns reported     No    Warm-up and Cool-down  Performed on first and last piece of equipment    Resistance Training Performed  Yes    VAD Patient?  No    PAD/SET Patient?  No      Pain Assessment   Currently in Pain?  No/denies          Social History   Tobacco Use  Smoking Status Never Smoker  Smokeless Tobacco Never Used    Goals Met:  Proper associated with RPD/PD & O2 Sat Independence with exercise equipment Using PLB without cueing & demonstrates good technique Exercise tolerated well No report of cardiac concerns or symptoms Strength training completed today  Goals Unmet:  Not Applicable  Comments: Pt able to follow exercise prescription today without complaint.  Will continue to monitor for progression. Reviewed home exercise with pt today.  Pt plans to walk and go to [employer] for exercise.  Reviewed THR, pulse, RPE, sign and symptoms, NTG use, and when to call 911 or MD.  Also discussed weather considerations and indoor options.  Pt voiced understanding.    Dr. Emily Filbert is Medical Director for [place] and [name].

## 2019-04-02 ENCOUNTER — Encounter: Payer: Self-pay | Admitting: *Deleted

## 2019-04-02 DIAGNOSIS — Z951 Presence of aortocoronary bypass graft: Secondary | ICD-10-CM

## 2019-04-02 NOTE — Progress Notes (Signed)
Cardiac Individual Treatment Plan  Patient Details  Name: Andrea [name] MRN: [id number] Date of Birth: 1946/05/25 Referring Provider:     Cardiac Rehab from 01/28/2019 in [facility] Cardiac and Pulmonary Rehab  Referring Provider  Chong Sicilian MD      Initial Encounter Date:    Cardiac Rehab from 01/28/2019 in [facility] Cardiac and Pulmonary Rehab  Date  01/28/19      Visit Diagnosis: S/P CABG x 3  Patient's Home Medications on Admission:  Current Outpatient Medications:  .  albuterol (PROVENTIL HFA;VENTOLIN HFA) 108 (90 Base) MCG/ACT inhaler, Inhale 2 puffs into the lungs every 6 (six) hours as needed for wheezing or shortness of breath., Disp: 1 Inhaler, Rfl: 0 .  amLODipine (NORVASC) 10 MG tablet, , Disp: , Rfl:  .  aspirin EC 81 MG tablet, Take 81 mg by mouth daily., Disp: , Rfl:  .  atorvastatin (LIPITOR) 40 MG tablet, Take 40 mg by mouth at bedtime., Disp: , Rfl: 3 .  benzonatate (TESSALON) 100 MG capsule, Take 1 capsule (100 mg total) by mouth 3 (three) times daily. (Patient not taking: Reported on 01/20/2019), Disp: 20 capsule, Rfl: 0 .  budesonide-formoterol (SYMBICORT) 160-4.5 MCG/ACT inhaler, INHALE TWO PUFFS BY MOUTH TWICE DAILY, Disp: , Rfl:  .  chlorpheniramine-HYDROcodone (TUSSIONEX) 10-8 MG/5ML SUER, Take 5 mLs by mouth at bedtime as needed for cough. (Patient not taking: Reported on 12/10/2018), Disp: 115 mL, Rfl: 0 .  Cholecalciferol (VITAMIN D3) 1000 units CAPS, Take 1 capsule by mouth daily., Disp: , Rfl:  .  ELIQUIS 5 MG TABS tablet, , Disp: , Rfl:  .  fluticasone (FLONASE) 50 MCG/ACT nasal spray, 1 spray by Each Nare route daily., Disp: , Rfl:  .  furosemide (LASIX) 20 MG tablet, , Disp: , Rfl:  .  glipiZIDE (GLUCOTROL) 10 MG tablet, TAKE ONE TABLET BY MOUTH TWICE DAILY TAKE  30  MINUTES  BEFORE  A  MEAL*, Disp: , Rfl:  .  JANUVIA 50 MG tablet, , Disp: , Rfl:  .  metoprolol succinate (TOPROL-XL) 25 MG 24 hr tablet, TAKE ONE TABLET BY MOUTH ONCE DAILY, Disp: , Rfl:  .   Multiple Vitamin (MULTI-VITAMINS) TABS, Take 1 tablet by mouth daily., Disp: , Rfl:  .  nitroGLYCERIN (NITROSTAT) 0.4 MG SL tablet, PLACE 1 TABLET UNDER THE TONGUE EVERY 5 MINUTES AS NEEDED FOR CHEST PAIN, Disp: , Rfl:  .  omeprazole (PRILOSEC) 20 MG capsule, Take 1 capsule by mouth daily., Disp: , Rfl:  .  predniSONE (DELTASONE) 10 MG tablet, 4 tabs po daily for two days (Patient not taking: Reported on 12/10/2018), Disp: 8 tablet, Rfl: 0 .  sitaGLIPtin (JANUVIA) 50 MG tablet, Take 50 mg by mouth., Disp: , Rfl:  .  tiotropium (SPIRIVA HANDIHALER) 18 MCG inhalation capsule, INHALE ONE DOSE BY MOUTH ONCE DAILY, Disp: , Rfl:   Past Medical History: Past Medical History:  Diagnosis Date  . COPD (chronic obstructive pulmonary disease) ([place])   . Diabetes mellitus without complication ([place])   . Heart attack ([place])   . Renal disorder     Tobacco Use: Social History   Tobacco Use  Smoking Status Never Smoker  Smokeless Tobacco Never Used    Labs: Recent Review Flowsheet Data    Labs for ITP Cardiac and Pulmonary Rehab Latest Ref Rng & Units 09/21/2017 11/16/2017   Hemoglobin A1c 4.8 - 5.6 % 7.2(H) 7.3(H)       Exercise Target Goals: Exercise Program Goal: Individual exercise prescription set using  results from initial 6 min walk test and THRR while considering  patient's activity barriers and safety.   Exercise Prescription Goal: Initial exercise prescription builds to 30-45 minutes a day of aerobic activity, 2-3 days per week.  Home exercise guidelines will be given to patient during program as part of exercise prescription that the participant will acknowledge.  Activity Barriers & Risk Stratification: Activity Barriers & Cardiac Risk Stratification - 12/17/18 1006      Activity Barriers & Cardiac Risk Stratification   Activity Barriers  Balance Concerns;History of Falls;Assistive Device;Other (comment);Deconditioning;Muscular Weakness    Comments  Currently wearing brace for foot  after concentator fell on it, using walker    Cardiac Risk Stratification  High       6 Minute Walk: 6 Minute Walk    Row Name 01/28/19 1557         6 Minute Walk   Phase  Initial     Distance  660 feet     Walk Time  4.48 minutes     # of Rest Breaks  1     MPH  1.2     METS  2     RPE  11     Perceived Dyspnea   1     VO2 Peak  7     Symptoms  Yes (comment)     Comments  Patient reports taking a break due to being tired and thirsty from wearing a mask.     Resting HR  104 bpm     Resting BP  118/58     Resting Oxygen Saturation   92 %     Exercise Oxygen Saturation  during 6 min walk  97 %     Max Ex. HR  127 bpm     Max Ex. BP  146/70     2 Minute Post BP  120/58        Oxygen Initial Assessment: Oxygen Initial Assessment - 12/17/18 1009      Home Oxygen   Home Oxygen Device  Home Concentrator;E-Tanks;Portable Concentrator    Sleep Oxygen Prescription  Continuous    Liters per minute  2    Home Exercise Oxygen Prescription  Pulsed    Liters per minute  2    Home at Rest Exercise Oxygen Prescription  None    Compliance with Home Oxygen Use  Yes      Program Oxygen Prescription   Program Oxygen Prescription  Portable Concentrator;Pulsed    Liters per minute  2      Intervention   Short Term Goals  To learn and exhibit compliance with exercise, home and travel O2 prescription;To learn and understand importance of monitoring SPO2 with pulse oximeter and demonstrate accurate use of the pulse oximeter.;To learn and understand importance of maintaining oxygen saturations>88%;To learn and demonstrate proper pursed lip breathing techniques or other breathing techniques.;To learn and demonstrate proper use of respiratory medications    Long  Term Goals  Exhibits compliance with exercise, home and travel O2 prescription;Verbalizes importance of monitoring SPO2 with pulse oximeter and return demonstration;Maintenance of O2 saturations>88%;Compliance with respiratory  medication;Exhibits proper breathing techniques, such as pursed lip breathing or other method taught during program session;Demonstrates proper use of MDI's       Oxygen Re-Evaluation: Oxygen Re-Evaluation    Row Name 01/29/19 1700 03/26/19 1534           Program Oxygen Prescription   Program Oxygen Prescription  None;E-Tanks;Continuous  E-Tanks  Liters per minute  2  1      Comments  -  patient has been using 1 liter and has been trying to come off oxygen if she can.        Home Oxygen   Home Oxygen Device  Portable Concentrator;Home Concentrator;E-Tanks  Portable Concentrator;Home Concentrator;E-Tanks      Sleep Oxygen Prescription  Continuous  Continuous      Liters per minute  2  2      Home Exercise Oxygen Prescription  Continuous  Continuous      Liters per minute  2  2      Home at Rest Exercise Oxygen Prescription  None  None no oxygen at rest      Compliance with Home Oxygen Use  Yes  Yes        Goals/Expected Outcomes   Short Term Goals  To learn and understand importance of monitoring SPO2 with pulse oximeter and demonstrate accurate use of the pulse oximeter.;To learn and understand importance of maintaining oxygen saturations>88%;To learn and demonstrate proper pursed lip breathing techniques or other breathing techniques.;To learn and exhibit compliance with exercise, home and travel O2 prescription  To learn and demonstrate proper use of respiratory medications;To learn and demonstrate proper pursed lip breathing techniques or other breathing techniques.;To learn and understand importance of maintaining oxygen saturations>88%;To learn and understand importance of monitoring SPO2 with pulse oximeter and demonstrate accurate use of the pulse oximeter.;To learn and exhibit compliance with exercise, home and travel O2 prescription      Long  Term Goals  Compliance with respiratory medication;Exhibits proper breathing techniques, such as pursed lip breathing or other method  taught during program session;Maintenance of O2 saturations>88%;Verbalizes importance of monitoring SPO2 with pulse oximeter and return demonstration;Exhibits compliance with exercise, home and travel O2 prescription  Demonstrates proper use of MDI's;Compliance with respiratory medication;Exhibits proper breathing techniques, such as pursed lip breathing or other method taught during program session;Maintenance of O2 saturations>88%;Verbalizes importance of monitoring SPO2 with pulse oximeter and return demonstration;Exhibits compliance with exercise, home and travel O2 prescription      Comments  Reviewed PLB technique with pt.  Talked about how it work and it's important to maintaining his exercise saturations.  Patient has a pulse ox at home and checks her oxygen when she is short of breath. She is going to try to check her oxygen when she wakes up. She sleeps with it but sometimes it comes off. She states she is taking her medications regularly and does not need and help or information on them.      Goals/Expected Outcomes  Short: Become more profiecient at using PLB.   Long: Become independent at using PLB.  Short: monitor oxygen when she wakes up. Long: maintain oxygen saturation at home independently.         Oxygen Discharge (Final Oxygen Re-Evaluation): Oxygen Re-Evaluation - 03/26/19 1534      Program Oxygen Prescription   Program Oxygen Prescription  E-Tanks    Liters per minute  1    Comments  patient has been using 1 liter and has been trying to come off oxygen if she can.      Home Oxygen   Home Oxygen Device  Portable Concentrator;Home Concentrator;E-Tanks    Sleep Oxygen Prescription  Continuous    Liters per minute  2    Home Exercise Oxygen Prescription  Continuous    Liters per minute  2    Home at Rest Exercise Oxygen Prescription  None   no oxygen at rest   Compliance with Home Oxygen Use  Yes      Goals/Expected Outcomes   Short Term Goals  To learn and demonstrate  proper use of respiratory medications;To learn and demonstrate proper pursed lip breathing techniques or other breathing techniques.;To learn and understand importance of maintaining oxygen saturations>88%;To learn and understand importance of monitoring SPO2 with pulse oximeter and demonstrate accurate use of the pulse oximeter.;To learn and exhibit compliance with exercise, home and travel O2 prescription    Long  Term Goals  Demonstrates proper use of MDI's;Compliance with respiratory medication;Exhibits proper breathing techniques, such as pursed lip breathing or other method taught during program session;Maintenance of O2 saturations>88%;Verbalizes importance of monitoring SPO2 with pulse oximeter and return demonstration;Exhibits compliance with exercise, home and travel O2 prescription    Comments  Patient has a pulse ox at home and checks her oxygen when she is short of breath. She is going to try to check her oxygen when she wakes up. She sleeps with it but sometimes it comes off. She states she is taking her medications regularly and does not need and help or information on them.    Goals/Expected Outcomes  Short: monitor oxygen when she wakes up. Long: maintain oxygen saturation at home independently.       Initial Exercise Prescription: Initial Exercise Prescription - 01/28/19 1600      Date of Initial Exercise RX and Referring Provider   Date  01/28/19    Referring Provider  Chong Sicilian MD      Oxygen   Oxygen  Continuous    Liters  2      Treadmill   MPH  1.3    Grade  0    Minutes  15    METs  1.2      NuStep   Level  2    SPM  60    Minutes  15    METs  2      Arm Ergometer   Level  1    Watts  15    RPM  10    Minutes  15    METs  1.2      Biostep-RELP   Level  2    SPM  50    Minutes  15    METs  2      Prescription Details   Frequency (times per week)  3    Duration  Progress to 30 minutes of continuous aerobic without signs/symptoms of physical  distress      Intensity   THRR 40-80% of Max Heartrate  122-139    Ratings of Perceived Exertion  11-15    Perceived Dyspnea  0-4      Progression   Progression  Continue progressive overload as per policy without signs/symptoms or physical distress.      Resistance Training   Weight  3    Reps  10-15       Perform Capillary Blood Glucose checks as needed.  Exercise Prescription Changes: Exercise Prescription Changes    Row Name 01/28/19 1600 02/14/19 1000 02/25/19 1400 03/13/19 1500 03/25/19 1100     Response to Exercise   Blood Pressure (Admit)  118/58  128/60  132/60  138/60  122/56   Blood Pressure (Exercise)  146/70  140/60  164/60  142/60  152/60   Blood Pressure (Exit)  120/58  130/58  130/60  128/58  130/58   Heart Rate (Admit)  104 bpm  88 bpm  65 bpm  85 bpm  94 bpm   Heart Rate (Exercise)  127 bpm  119 bpm  130 bpm  128 bpm  112 bpm   Heart Rate (Exit)  89 bpm  86 bpm  99 bpm  109 bpm  103 bpm   Oxygen Saturation (Admit)  92 %  -  -  -  -   Oxygen Saturation (Exercise)  97 %  -  -  -  -   Oxygen Saturation (Exit)  95 %  -  -  -  -   Rating of Perceived Exertion (Exercise)  _0 Perceived Dyspnea (Exercise)  1  -  -  -  0   Symptoms  - Patient stopped to rest because she felt tired and thirsty.  -  -  -  none   Duration  Progress to 30 minutes of  aerobic without signs/symptoms of physical distress  Continue with 30 min of aerobic exercise without signs/symptoms of physical distress.  Continue with 30 min of aerobic exercise without signs/symptoms of physical distress.  Continue with 30 min of aerobic exercise without signs/symptoms of physical distress.  Continue with 30 min of aerobic exercise without signs/symptoms of physical distress.   Intensity  THRR New  THRR unchanged  THRR unchanged  THRR unchanged  THRR unchanged     Progression   Progression  Continue to progress workloads to maintain intensity without signs/symptoms of physical distress.   Continue to progress workloads to maintain intensity without signs/symptoms of physical distress.  Continue to progress workloads to maintain intensity without signs/symptoms of physical distress.  Continue to progress workloads to maintain intensity without signs/symptoms of physical distress.  Continue to progress workloads to maintain intensity without signs/symptoms of physical distress.   Average METs  -  -  2.4  2.6  2.16     Resistance Training   Training Prescription  -  Yes  Yes  Yes  Yes   Weight  -  3 lb  3 lb  3 lb  3 lbs   Reps  -  10-15  10-15  10-15  10-15     Interval Training   Interval Training  -  -  -  -  No     Oxygen   Oxygen  -  Continuous  Continuous  Continuous  Continuous   Liters  -  _1 Treadmill   MPH  -  -  -  1  1.5   Grade  -  -  -  0  0   Minutes  -  -  -  15  15   METs  -  -  -  1.77  2.15     Recumbant Bike   Level  -  _2 RPM  -  -  -  60  -   Watts  -  -  -  25  -   Minutes  -  _3 METs  -  3.11  3.11  2.3  2     NuStep   Level  -  _4 SPM  -  60  60  60  -   Minutes  -  15  15  15  15   METs  -  -  1.9  1.4  2.4     T5 Nustep   Level  -  -  -  -  4   Minutes  -  -  -  -  15   METs  -  -  -  -  2.1   Row Name 03/27/19 1600             Home Exercise Plan   Plans to continue exercise at   [facility] (comment) [employer]       Frequency  Add 1 additional day to program exercise sessions.       Initial Home Exercises Provided  03/27/19          Exercise Comments: Exercise Comments    Row Name 12/17/18 1016 01/29/19 1658 02/12/19 1651       Exercise Comments  Pt would like to work towards coming off her oxygen  First full day of exercise!  Patient was oriented to gym and equipment including functions, settings, policies, and procedures.  Patient's individual exercise prescription and treatment plan were reviewed.  All starting workloads were established based on the results of  the 6 minute walk test done at initial orientation visit.  The plan for exercise progression was also introduced and progression will be customized based on patient's performance and goals.  Andrea [name] has been moving. 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   Some SOB and need for rescue inhaler with REC Bike use. Was able to complete exercise session without further problems.        Exercise Goals and Review: Exercise Goals    Row Name 12/17/18 1014 01/28/19 1611           Exercise Goals   Increase Physical Activity  Yes  Yes      Intervention  Provide advice, education, support and counseling about physical activity/exercise needs.;Develop an individualized exercise prescription for aerobic and resistive training based on initial evaluation findings, risk stratification, comorbidities and participant's personal goals.  Provide advice, education, support and counseling about physical activity/exercise needs.;Develop an individualized exercise prescription for aerobic and resistive training based on initial evaluation findings, risk stratification, comorbidities and participant's personal goals.      Expected Outcomes  Long Term: Add in home exercise to make exercise part of routine and to increase amount of physical activity.;Short Term: Attend rehab on a regular basis to increase amount of physical activity.;Long Term: Exercising regularly at least 3-5 days a week.  Long Term: Add in home exercise to make exercise part of routine and to increase amount of physical activity.;Short Term: Attend rehab on a regular basis to increase amount of physical activity.;Long Term: Exercising regularly at least 3-5 days a week.      Increase Strength and Stamina  Yes  Yes      Intervention  Provide advice, education, support and counseling about physical activity/exercise needs.;Develop an individualized exercise prescription for aerobic and resistive training based on initial evaluation  findings, risk stratification, comorbidities and participant's personal goals.  Provide advice, education, support and counseling about physical activity/exercise needs.;Develop an individualized exercise prescription for aerobic and resistive training based on initial evaluation findings, risk stratification, comorbidities and participant's personal goals.      Expected Outcomes  Short Term: Increase workloads from initial exercise prescription for resistance, speed, and METs.;Short Term: Perform resistance training exercises routinely during rehab and add in resistance training at home;Long  Term: Improve cardiorespiratory fitness, muscular endurance and strength as measured by increased METs and functional capacity (6MWT)  Short Term: Increase workloads from initial exercise prescription for resistance, speed, and METs.;Short Term: Perform resistance training exercises routinely during rehab and add in resistance training at home;Long Term: Improve cardiorespiratory fitness, muscular endurance and strength as measured by increased METs and functional capacity (6MWT)      Able to understand and use rate of perceived exertion (RPE) scale  Yes  Yes      Intervention  Provide education and explanation on how to use RPE scale  Provide education and explanation on how to use RPE scale      Expected Outcomes  Short Term: Able to use RPE daily in rehab to express subjective intensity level;Long Term:  Able to use RPE to guide intensity level when exercising independently  Short Term: Able to use RPE daily in rehab to express subjective intensity level;Long Term:  Able to use RPE to guide intensity level when exercising independently      Able to understand and use Dyspnea scale  Yes  Yes      Intervention  Provide education and explanation on how to use Dyspnea scale  Provide education and explanation on how to use Dyspnea scale      Expected Outcomes  Short Term: Able to use Dyspnea scale daily in rehab to express  subjective sense of shortness of breath during exertion;Long Term: Able to use Dyspnea scale to guide intensity level when exercising independently  Short Term: Able to use Dyspnea scale daily in rehab to express subjective sense of shortness of breath during exertion;Long Term: Able to use Dyspnea scale to guide intensity level when exercising independently      Knowledge and understanding of Target Heart Rate Range (THRR)  Yes  Yes      Intervention  Provide education and explanation of THRR including how the numbers were predicted and where they are located for reference  Provide education and explanation of THRR including how the numbers were predicted and where they are located for reference      Expected Outcomes  Short Term: Able to state/look up THRR;Short Term: Able to use daily as guideline for intensity in rehab;Long Term: Able to use THRR to govern intensity when exercising independently  Short Term: Able to state/look up THRR;Short Term: Able to use daily as guideline for intensity in rehab;Long Term: Able to use THRR to govern intensity when exercising independently      Able to check pulse independently  Yes  Yes      Intervention  Provide education and demonstration on how to check pulse in carotid and radial arteries.;Review the importance of being able to check your own pulse for safety during independent exercise  Provide education and demonstration on how to check pulse in carotid and radial arteries.;Review the importance of being able to check your own pulse for safety during independent exercise      Expected Outcomes  Short Term: Able to explain why pulse checking is important during independent exercise;Long Term: Able to check pulse independently and accurately  Short Term: Able to explain why pulse checking is important during independent exercise;Long Term: Able to check pulse independently and accurately      Understanding of Exercise Prescription  Yes  Yes      Intervention   Provide education, explanation, and written materials on patient's individual exercise prescription  Provide education, explanation, and written materials on patient's individual exercise prescription  Expected Outcomes  Short Term: Able to explain program exercise prescription;Long Term: Able to explain home exercise prescription to exercise independently  Short Term: Able to explain program exercise prescription;Long Term: Able to explain home exercise prescription to exercise independently         Exercise Goals Re-Evaluation : Exercise Goals Re-Evaluation    Row Name 01/07/19 1059 01/29/19 1658 02/14/19 1049 02/20/19 1605 02/25/19 1501     Exercise Goal Re-Evaluation   Exercise Goals Review  Increase Physical Activity;Increase Strength and Stamina;Understanding of Exercise Prescription  Able to understand and use rate of perceived exertion (RPE) scale;Knowledge and understanding of Target Heart Rate Range (THRR);Understanding of Exercise Prescription  Increase Physical Activity;Increase Strength and Stamina;Able to understand and use rate of perceived exertion (RPE) scale;Knowledge and understanding of Target Heart Rate Range (THRR);Able to check pulse independently;Understanding of Exercise Prescription  Increase Physical Activity;Increase Strength and Stamina;Able to understand and use rate of perceived exertion (RPE) scale;Knowledge and understanding of Target Heart Rate Range (THRR);Able to check pulse independently;Understanding of Exercise Prescription  Increase Physical Activity;Increase Strength and Stamina;Able to understand and use rate of perceived exertion (RPE) scale;Knowledge and understanding of Target Heart Rate Range (THRR);Able to check pulse independently;Understanding of Exercise Prescription   Comments  Andrea [name] has been exercising some.  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  She aims for at least 30 min every day!!   Overall, she is feeling good and eager to start rehab in the gym and get back on a treadmill again.  Reviewed RPE scale, THR and program prescription with pt today.  Pt voiced understanding and was given a copy of goals to take home.  Andrea [name] is doing well with exercise so far.  She works at prescribed RPE/HR ranges. Staff will monitor progress.  Andrea [name] enjoys coming to Cardiac Rehab. Patient is very cheerful and is seeing positive. Patient has started driving. Patient was able to put her walker in the car and put oxygen in the car. Patient was unable to do any of that before the program. Patient reports that becoming easier all the time. Increasing workloads on machines weekly. Patient is not as breathless as last week in the program.  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  She wants to get off oxygen.   Expected Outcomes  Short: Continue to at least walk daily.  Long: Get started with rehab in gym.  Short: Use RPE daily to regulate intensity. Long: Follow program prescription in THR.  Short - attend class consistently Long - improve overall MET level  Short- attending class regularly, walking, and riding bike at home. Long- exercising on days off for a total of 5 days per week  Short - build syamina walking Long - increase overall MET level and get off oxygen   [place] Name 03/13/19 1546 03/25/19 1118           Exercise Goal Re-Evaluation   Exercise Goals Review  Increase Physical Activity;Increase Strength and Stamina;Able to understand and use rate of perceived exertion (RPE) scale;Knowledge and understanding of Target Heart Rate Range (THRR);Able to check pulse independently;Understanding of Exercise Prescription  Increase Physical Activity;Increase Strength and Stamina;Understanding of Exercise Prescription      Comments  Andrea [name] has done well on the TM.  She has reduced oxygen to 1L and sats remained above 88.  Andrea [name] is doing well in rehab.  She continues to have good  saturations on 1L of  oxygen.  We will try to her off her oxygen on the seated equipment.  If she does well, we will try to increase her workloads as well. We will continue to monitor her progress.      Expected Outcomes  Short - continue to use TM Long increase MET level and get off oxygen  Short: Try room air on seated equipment.  Long: Continue to improve stamina.         Discharge Exercise Prescription (Final Exercise Prescription Changes): Exercise Prescription Changes - 03/27/19 1600      Home Exercise Plan   Plans to continue exercise at  [facility] (comment)   [employer]   Frequency  Add 1 additional day to program exercise sessions.    Initial Home Exercises Provided  03/27/19       Nutrition:  Target Goals: Understanding of nutrition guidelines, daily intake of sodium <1527m, cholesterol <2044m calories 30% from fat and 7% or less from saturated fats, daily to have 5 or more servings of fruits and vegetables.  Biometrics:    Nutrition Therapy Plan and Nutrition Goals: Nutrition Therapy & Goals - 01/28/19 1340      Nutrition Therapy   Diet  Low Na, HH, DM diet    Protein (specify units)  60-65g    Fiber  25 grams    Whole Grain Foods  3 servings    Saturated Fats  12 max. grams    Fruits and Vegetables  5 servings/day    Sodium  1.5 grams      Personal Nutrition Goals   Nutrition Goal  ST: Adding snacks in between meals to reduce mindless eating LT: breath without O2 and lose 15 lbs (155-->140 lbs)    Comments  Pt on Lasix. Pt reports eating cheerios with 2% milk (wants to change to almond milk but doesn't like it, talked about soymilk) OR frozen pancakes (every 1-2 weeks) OR oatmeal and decaf coffee (they told her in the hospital not the have caffiene but is unsure if thats still true, told her to call her doctor and ask). Pt will drink diet soda. L chicken salad sandwich on whole wheat (used to be tomato, now cant tolerate tomatoes) with some cheetos (puffed). Will snack on  cheetos now in quarentine. D chicken (doesn't like other meat) or pinto bean and broccoli casserole. Will watch her Na and says her dr told her that her numbers are fine. Pt would like to work on the amount of snacks she eats due to boredom ; discussed midful eating and halthy snacking. Discussed HH and DM eating. Discussed increased protein and calorie needs due to her pulmonary issues and maintaining muscle mass. Pt reports sister will shop for her and is trying to get her to buy [facility] DM friendly foods, just got her to stop bringing sweets.      Intervention Plan   Intervention  Prescribe, educate and counsel regarding individualized specific dietary modifications aiming towards targeted core components such as weight, hypertension, lipid management, diabetes, heart failure and other comorbidities.;Nutrition handout(s) given to patient.    Expected Outcomes  Short Term Goal: Understand basic principles of dietary content, such as calories, fat, sodium, cholesterol and nutrients.;Short Term Goal: A plan has been developed with personal nutrition goals set during dietitian appointment.;Long Term Goal: Adherence to prescribed nutrition plan.       Nutrition Assessments: Nutrition Assessments - 01/29/19 1128      Rate Your Plate Scores  Pre Score  39       Nutrition Goals Re-Evaluation: Nutrition Goals Re-Evaluation    [place] Name 02/24/19 1548 03/24/19 1549           Goals   Nutrition Goal  ST:  continue healthy snacking and increase vegetable and fruit intake for now LT: breath without O2 and lose 15 lbs (155-->140 lbs)  ST:  continue healthy snacking and increase vegetable and fruit intake for now LT: breath without O2 and lose 15 lbs (155-->140 lbs)      Comment  Eating fruit and vegetables as a part of snack and meal. Vegetables like broccoli brussell sprouts, squash, celery, carrots, etc. Fruit apples, tangerines, grapes, bananas. 3 fruit/day, 4 vegetables/day. Pt eating more mindfully  peanutbutter and celery. feeling full, doesn't feel as tired, and has regular bowel movements.  Eating fruit and vegetables as a part of snack and meal. Vegetables like broccoli brussell sprouts, squash, celery, carrots, etc. Fruit apples, tangerines, grapes, bananas. 3 fruit/day, 4 vegetables/day. Pt eating more mindfully peanutbutter and celery. feeling full, doesn't feel as tired, and has regular bowel movements. Continue to use healthy snacks to moderate hunger during the day and keep the variety. Pt reports not wanting to make a new goal or needing anything from this RD.      Expected Outcome  continue progress with HH eating  ST:  continue healthy snacking and increase vegetable and fruit intake for now LT: breath without O2 and lose 15 lbs (155-->140 lbs)         Nutrition Goals Discharge (Final Nutrition Goals Re-Evaluation): Nutrition Goals Re-Evaluation - 03/24/19 1549      Goals   Nutrition Goal  ST:  continue healthy snacking and increase vegetable and fruit intake for now LT: breath without O2 and lose 15 lbs (155-->140 lbs)    Comment  Eating fruit and vegetables as a part of snack and meal. Vegetables like broccoli brussell sprouts, squash, celery, carrots, etc. Fruit apples, tangerines, grapes, bananas. 3 fruit/day, 4 vegetables/day. Pt eating more mindfully peanutbutter and celery. feeling full, doesn't feel as tired, and has regular bowel movements. Continue to use healthy snacks to moderate hunger during the day and keep the variety. Pt reports not wanting to make a new goal or needing anything from this RD.    Expected Outcome  ST:  continue healthy snacking and increase vegetable and fruit intake for now LT: breath without O2 and lose 15 lbs (155-->140 lbs)       Psychosocial: Target Goals: Acknowledge presence or absence of significant depression and/or stress, maximize coping skills, provide positive support system. Participant is able to verbalize types and ability to use  techniques and skills needed for reducing stress and depression.   Initial Review & Psychosocial Screening: Initial Psych Review & Screening - 01/20/19 1052      Initial Review   Current issues with  Current Stress Concerns;Current Sleep Concerns    Source of Stress Concerns  Chronic Illness    Comments  Andrea [name] is self isolating away from family and friends. Her MD recently told her not to go back to church because of being high risk. She misses it, but stays in contact with her church friends and her family daily. She reports not sleeping well mainly because of the concentrator's noise level. She is also getting ready to move to a bigger apartment in September which she is really excited about!      Family Dynamics   Good Support System?  Yes      Barriers   Psychosocial barriers to participate in program  There are no identifiable barriers or psychosocial needs.      Screening Interventions   Interventions  Encouraged to exercise    Expected Outcomes  Long Term Goal: Stressors or current issues are controlled or eliminated.;Short Term goal: Utilizing psychosocial counselor, staff and physician to assist with identification of specific Stressors or current issues interfering with healing process. Setting desired goal for each stressor or current issue identified.;Short Term goal: Identification and review with participant of any Quality of Life or Depression concerns found by scoring the questionnaire.;Long Term goal: The participant improves quality of Life and PHQ9 Scores as seen by post scores and/or verbalization of changes       Quality of Life Scores:   Scores of 19 and below usually indicate a poorer quality of life in these areas.  A difference of  2-3 points is a clinically meaningful difference.  A difference of 2-3 points in the total score of the Quality of Life Index has been associated with significant improvement in overall quality of life, self-image, physical symptoms, and  general health in studies assessing change in quality of life.  PHQ-9: Recent Review Flowsheet Data    There is no flowsheet data to display.     Interpretation of Total Score  Total Score Depression Severity:  1-4 = Minimal depression, 5-9 = Mild depression, 10-14 = Moderate depression, 15-19 = Moderately severe depression, 20-27 = Severe depression   Psychosocial Evaluation and Intervention:   Psychosocial Re-Evaluation: Psychosocial Re-Evaluation    [place] Name 01/07/19 1111 02/20/19 1546 03/26/19 1541         Psychosocial Re-Evaluation   Current issues with  Current Stress Concerns  Current Stress Concerns  None Identified     Comments  She is feeling good at home.  She is worried about her younger daughter not wearing a mask.  Her oldest is a Marine scientist and has tested positive for COVID already.  She is worried for her family, but coping the best she can.  Andrea [name] is stressed about moving. She is moving. She is working really hard to move all of her belongings to a bigger apartment. Her oxygen and her walker were too crowded in her old apartment. Patient reports having chest pain when working hard and being stressed and that it went away with rest. Chest pain was a 2 out of 10. Patient was encouraged to ask for help and is going to do so. Patient knows a friend that will help this weekend.  She has now moved and her stress has been lifted alot since she can get around better. She has everything moved in straightened out and has not been at ease.     Expected Outcomes  Short: Continue to talk to daughters on phone for connection and get started with rehab.  Long: Continue to stay positive.  Short: Patient will have everything moved by Monday by asking friend for help. Long: Patient will work on conserving energy by working in [employer], sitting when she can to do certain activities, and taking breaks during big house chores as needed. Patient understanding that she needs to find a new way of  completing tasks then before procedure.  Short: attend [facility] regularly. Long: maintain a workout regimine to keep stress at a minumum.     Interventions  Encouraged to attend Cardiac Rehabilitation for the exercise  -  Encouraged to attend Cardiac Rehabilitation for  the exercise     Continue Psychosocial Services   Follow up required by staff  -  Follow up required by staff        Psychosocial Discharge (Final Psychosocial Re-Evaluation): Psychosocial Re-Evaluation - 03/26/19 1541      Psychosocial Re-Evaluation   Current issues with  None Identified    Comments  She has now moved and her stress has been lifted alot since she can get around better. She has everything moved in straightened out and has not been at ease.    Expected Outcomes  Short: attend [facility] regularly. Long: maintain a workout regimine to keep stress at a minumum.    Interventions  Encouraged to attend Cardiac Rehabilitation for the exercise    Continue Psychosocial Services   Follow up required by staff       Vocational Rehabilitation: Provide vocational rehab assistance to qualifying candidates.   Vocational Rehab Evaluation & Intervention: Vocational Rehab - 01/20/19 1044      Initial Vocational Rehab Evaluation & Intervention   Assessment shows need for Vocational Rehabilitation  No       Education: Education Goals: Education classes will be provided on a variety of topics geared toward better understanding of heart health and risk factor modification. Participant will state understanding/return demonstration of topics presented as noted by education test scores.  Learning Barriers/Preferences: Learning Barriers/Preferences - 01/20/19 1052      Learning Barriers/Preferences   Learning Barriers  None    Learning Preferences  None       Education Topics:  AED/CPR: - Group verbal and written instruction with the use of models to demonstrate the basic use of the AED with the basic ABC's of  resuscitation.   General Nutrition Guidelines/Fats and Fiber: -Group instruction provided by verbal, written material, models and posters to present the general guidelines for heart healthy nutrition. Gives an explanation and review of dietary fats and fiber.   Controlling Sodium/Reading Food Labels: -Group verbal and written material supporting the discussion of sodium use in heart healthy nutrition. Review and explanation with models, verbal and written materials for utilization of the food label.   Exercise Physiology & General Exercise Guidelines: - Group verbal and written instruction with models to review the exercise physiology of the cardiovascular system and associated critical values. Provides general exercise guidelines with specific guidelines to those with heart or lung disease.    Aerobic Exercise & Resistance Training: - Gives group verbal and written instruction on the various components of exercise. Focuses on aerobic and resistive training programs and the benefits of this training and how to safely progress through these programs..   Flexibility, Balance, Mind/Body Relaxation: Provides group verbal/written instruction on the benefits of flexibility and balance training, including mind/body exercise modes such as yoga, pilates and tai chi.  Demonstration and skill practice provided.   Stress and Anxiety: - Provides group verbal and written instruction about the health risks of elevated stress and causes of high stress.  Discuss the correlation between heart/lung disease and anxiety and treatment options. Review healthy ways to manage with stress and anxiety.   Depression: - Provides group verbal and written instruction on the correlation between heart/lung disease and depressed mood, treatment options, and the stigmas associated with seeking treatment.   Anatomy & Physiology of the Heart: - Group verbal and written instruction and models provide basic cardiac anatomy  and physiology, with the coronary electrical and arterial systems. Review of Valvular disease and Heart Failure   Cardiac Procedures: -  Group verbal and written instruction to review commonly prescribed medications for heart disease. Reviews the medication, class of the drug, and side effects. Includes the steps to properly store meds and maintain the prescription regimen. (beta blockers and nitrates)   Cardiac Medications I: - Group verbal and written instruction to review commonly prescribed medications for heart disease. Reviews the medication, class of the drug, and side effects. Includes the steps to properly store meds and maintain the prescription regimen.   Cardiac Medications II: -Group verbal and written instruction to review commonly prescribed medications for heart disease. Reviews the medication, class of the drug, and side effects. (all other drug classes)    Go Sex-Intimacy & Heart Disease, Get SMART - Goal Setting: - Group verbal and written instruction through game format to discuss heart disease and the return to sexual intimacy. Provides group verbal and written material to discuss and apply goal setting through the application of the S.M.A.R.T. Method.   Other Matters of the Heart: - Provides group verbal, written materials and models to describe Stable Angina and Peripheral Artery. Includes description of the disease process and treatment options available to the cardiac patient.   Exercise & Equipment Safety: - Individual verbal instruction and demonstration of equipment use and safety with use of the equipment.   Cardiac Rehab from 01/28/2019 in [facility] Cardiac [facility] Pulmonary Rehab  Date  01/28/19  Educator  [place]  Instruction Review Code  1- Verbalizes Understanding      Infection Prevention: - Provides verbal and written material to individual with discussion of infection control including proper hand washing and proper equipment cleaning during exercise session.    Cardiac Rehab from 01/28/2019 in [facility] Cardiac and Pulmonary Rehab  Date  01/28/19  Educator  [place]  Instruction Review Code  1- Verbalizes Understanding      Falls Prevention: - Provides verbal and written material to individual with discussion of falls prevention and safety.   Cardiac Rehab from 01/28/2019 in [facility] Cardiac and Pulmonary Rehab  Date  01/28/19  Educator  [place]  Instruction Review Code  1- Verbalizes Understanding      Diabetes: - Individual verbal and written instruction to review signs/symptoms of diabetes, desired ranges of glucose level fasting, after meals and with exercise. Acknowledge that pre and post exercise glucose checks will be done for 3 sessions at entry of program.   Cardiac Rehab from 01/28/2019 in [facility] Cardiac and Pulmonary Rehab  Date  01/28/19  Educator  [place]  Instruction Review Code  1- Verbalizes Understanding      Know Your Numbers and Risk Factors: -Group verbal and written instruction about important numbers in your health.  Discussion of what are risk factors and how they play a role in the disease process.  Review of Cholesterol, Blood Pressure, Diabetes, and BMI and the role they play in your overall health.   Sleep Hygiene: -Provides group verbal and written instruction about how sleep can affect your health.  Define sleep hygiene, discuss sleep cycles and impact of sleep habits. Review good sleep hygiene tips.    Other: -Provides group and verbal instruction on various topics (see comments)   Knowledge Questionnaire Score:   Core Components/Risk Factors/Patient Goals at Admission: Personal Goals and Risk Factors at Admission - 12/17/18 1009      Core Components/Risk Factors/Patient Goals on Admission    Weight Management  Yes;Weight Loss    Intervention  Weight Management: Develop a combined nutrition and exercise program designed to reach desired caloric intake,  while maintaining appropriate intake of nutrient and fiber, sodium and fats, and  appropriate energy expenditure required for the weight goal.;Weight Management: Provide education and appropriate resources to help participant work on and attain dietary goals.    Expected Outcomes  Short Term: Continue to assess and modify interventions until short term weight is achieved;Long Term: Adherence to nutrition and physical activity/exercise program aimed toward attainment of established weight goal;Weight Loss: Understanding of general recommendations for a balanced deficit meal plan, which promotes 1-2 lb weight loss per week and includes a negative energy balance of [phone] kcal/d;Understanding recommendations for meals to include 15-35% energy as protein, 25-35% energy from fat, 35-60% energy from carbohydrates, less than 217m of dietary cholesterol, 20-35 gm of total fiber daily;Understanding of distribution of calorie intake throughout the day with the consumption of 4-5 meals/snacks    Diabetes  Yes    Intervention  Provide education about signs/symptoms and action to take for hypo/hyperglycemia.;Provide education about proper nutrition, including hydration, and aerobic/resistive exercise prescription along with prescribed medications to achieve blood glucose in normal ranges: Fasting glucose 65-99 mg/dL    Expected Outcomes  Short Term: Participant verbalizes understanding of the signs/symptoms and immediate care of hyper/hypoglycemia, proper foot care and importance of medication, aerobic/resistive exercise and nutrition plan for blood glucose control.;Long Term: Attainment of HbA1C < 7%.    Hypertension  Yes    Intervention  Provide education on lifestyle modifcations including regular physical activity/exercise, weight management, moderate sodium restriction and increased consumption of fresh fruit, vegetables, and low fat dairy, alcohol moderation, and smoking cessation.;Monitor prescription use compliance.    Expected Outcomes  Short Term: Continued assessment and intervention  until BP is < 140/925mHG in hypertensive participants. < 130/8054mG in hypertensive participants with diabetes, heart failure or chronic kidney disease.;Long Term: Maintenance of blood pressure at goal levels.    Lipids  Yes    Intervention  Provide education and support for participant on nutrition & aerobic/resistive exercise along with prescribed medications to achieve LDL <17m15mDL >40mg76m Expected Outcomes  Long Term: Cholesterol controlled with medications as prescribed, with individualized exercise RX and with personalized nutrition plan. Value goals: LDL < 17mg,72m > 40 mg.;Short Term: Participant states understanding of desired cholesterol values and is compliant with medications prescribed. Participant is following exercise prescription and nutrition guidelines.       Core Components/Risk Factors/Patient Goals Review:  Goals and Risk Factor Review    Row Name 01/07/19 1112 01/20/19 1046 01/28/19 1612 02/20/19 1554 03/26/19 1544     Core Components/Risk Factors/Patient Goals Review   Personal Goals Review  Weight Management/Obesity;Hypertension  Weight Management/Obesity;Diabetes;Hypertension  Weight Management/Obesity;Diabetes;Hypertension  Weight Management/Obesity;Diabetes;Hypertension;Other  Weight Management/Obesity;Hypertension;Diabetes;Lipids   Review  Her weight and blood pressures have been good and she has not noted any problems.  Her weight is a little up, but slowly. She is keeping an eye on it, but still wants to lose weight. Her current weight is 153lb and she wants to get down to 140 lb. Her blood pressure has been running well. She feels like she is managing her diabetes well.  Her weight is a little up, but slowly. She is keeping an eye on it, but still wants to lose weight. Her current weight is 153lb and she wants to get down to 140 lb. Her blood pressure has been running well. She feels like she is managing her diabetes well.  Patient reports no weight loss but that  her clothes feel less  tight and she thinks that she is firming up. Patient is determined to lose weight. Patient does have a blood pressure cuff at home and checks it once daily. BP has been elevated some when entering Cardiac Rehab but fine for exit BP. Patient checks blood glucose every morning and sometimes at night. Fasting blood sugar is in the normal range and night time blood sugar is never over 200 mg/dL. Patient takes all of her medications the way the doctor has prescribed them. Patient said the doctor is taking her off of blood thinners this month.Patient really wants to become healthy enough to stop wearing oxygen.  Patient wants to be aroung 140 pounds. She weighed 159 pounds today. She is not sure what she is eating to cause her weight going up. She states she has been eating less. Her A1C is 7.7. She states it is better than when she had her heart surgury. [place] and Glipozide is what she is maintaining her blood sugars with. She checks her sugar at home regularly.   Expected Outcomes  Short and Long: Continue to monitor risk factors.  Short: come to Cardiac Rehab to learn more about risk factors. Long: become independent with managing risk factors.  Short: come to Cardiac Rehab to learn more about risk factors. Long: become independent with managing risk factors.  Short: Patient wants to continue becoming stronger, lose weight, and needing less oxygen. Long: Patient wants to stop or decrease wearing oxygen by increasing her lung function with exercise.  Short: lose 5 pounds in two weeks. Long: maintain weight loss and continue to lose weight.      Core Components/Risk Factors/Patient Goals at Discharge (Final Review):  Goals and Risk Factor Review - 03/26/19 1544      Core Components/Risk Factors/Patient Goals Review   Personal Goals Review  Weight Management/Obesity;Hypertension;Diabetes;Lipids    Review  Patient wants to be aroung 140 pounds. She weighed 159 pounds today. She is not sure  what she is eating to cause her weight going up. She states she has been eating less. Her A1C is 7.7. She states it is better than when she had her heart surgury. [place] and Glipozide is what she is maintaining her blood sugars with. She checks her sugar at home regularly.    Expected Outcomes  Short: lose 5 pounds in two weeks. Long: maintain weight loss and continue to lose weight.       ITP Comments: ITP Comments    Row Name 12/10/18 1057 12/17/18 1006 12/25/18 1004 01/07/19 1044 01/20/19 1035   ITP Comments  Initial Visit for [facility] today. Consent and intake completed. Appt made for EP and RD .  Completed initial ExRx created and sent to Dr. Emily Filbert, Medical Director to review and sign.  Teale contacted [place] via the [employer] App that she needs to hold off on exercise until cleared by her doctor.  Virtual appt completed.  She is feeling better and has been wearing an event monitor for 14 days. She turns it in on Thurs 01/09/19.  She has had a few episodes of "weakness" feeling and would like to wait to actually come in person until after her f/u visit on 01/27/19 with Dr. Sabra Heck.  RN Virtual orientation completed. Diagnosis can be found in CE 2/18. EP/RD scheduled for 8/4   Row Name 01/29/19 1658 02/05/19 0559 02/12/19 1650 03/05/19 0607 04/02/19 1257   ITP Comments  First full day of exercise!  Patient was oriented to gym and  equipment including functions, settings, policies, and procedures.  Patient's individual exercise prescription and treatment plan were reviewed.  All starting workloads were established based on the results of the 6 minute walk test done at initial orientation visit.  The plan for exercise progression was also introduced and progression will be customized based on patient's performance and goals.  30 Day Review Completed today. Continue with ITP unless changed by Medical Director review.  Penda has been moving. 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   Some SOB and need for rescue inhaler with REC Bike use. Was able to complete exercise session without further problems.  30 Day review. Continue with ITP unless directed changes per Medical Director review.  30 day review completed. ITP sent to Dr. Emily Filbert, Medical Director of Cardiac and Pulmonary Rehab. Continue with ITP unless changes are made by physician.  Department closed starting 10/2 until further notice by infection prevention and Health at Work teams for [place].      Comments: 30 day review

## 2019-04-07 ENCOUNTER — Other Ambulatory Visit: Payer: Self-pay

## 2019-04-07 ENCOUNTER — Encounter: Payer: Medicare Other | Admitting: *Deleted

## 2019-04-07 DIAGNOSIS — Z951 Presence of aortocoronary bypass graft: Secondary | ICD-10-CM | POA: Diagnosis not present

## 2019-04-07 NOTE — Progress Notes (Signed)
Daily Session Note  Patient Details  Name: KATLIN BORTNER MRN: [id number] Date of Birth: Sep 06, 1945 Referring Provider:     Cardiac Rehab from 01/28/2019 in [facility]  Referring Provider  Chong Sicilian MD      Encounter Date: 04/07/2019  Check In: Session Check In - 04/07/19 1724      Check-In   Supervising physician immediately available to respond to emergencies  See telemetry face sheet for immediately available ER MD    Location  [facility]-Cardiac & Pulmonary Rehab    Staff Present  Renita Papa, RN BSN;Carroll Enterkin, RN, BSN-BC, CCRP;Joseph Hood RCP,RRT,BSRT    Virtual Visit  No    Medication changes reported      No    Fall or balance concerns reported     No    Warm-up and Cool-down  Performed on first and last piece of equipment    Resistance Training Performed  Yes    VAD Patient?  No    PAD/SET Patient?  No      Pain Assessment   Currently in Pain?  No/denies          Social History   Tobacco Use  Smoking Status Never Smoker  Smokeless Tobacco Never Used    Goals Met:  Independence with exercise equipment Exercise tolerated well Strength training completed today  Goals Unmet:  Not Applicable  Comments: Pt able to follow exercise prescription today without complaint.  Will continue to monitor for progression.    Dr. Emily Filbert is Medical Director for [place] and [name].

## 2019-04-09 ENCOUNTER — Other Ambulatory Visit: Payer: Self-pay

## 2019-04-09 ENCOUNTER — Encounter: Payer: Medicare Other | Admitting: *Deleted

## 2019-04-09 DIAGNOSIS — Z951 Presence of aortocoronary bypass graft: Secondary | ICD-10-CM

## 2019-04-09 NOTE — Progress Notes (Signed)
Daily Session Note  Patient Details  Name: Andrea [name] MRN: [id number] Date of Birth: 1946-05-10 Referring Provider:     Cardiac Rehab from 01/28/2019 in [facility] Cardiac and Pulmonary Rehab  Referring Provider  Chong Sicilian MD      Encounter Date: 04/09/2019  Check In: Session Check In - 04/09/19 1553      Check-In   Supervising physician immediately available to respond to emergencies  See telemetry face sheet for immediately available ER MD    Location  [facility] Pul[facility]    Staff Present  Renita Papa, RN BSN;Melissa Caiola RDN, LDN;Joseph Tessie Fass RCP,RRT,BSRT    Virtual Visit  No    Medication changes reported      No    Fall or balance concerns reported     No    Warm-up and Cool-down  Performed on first and last piece of equipment    Resistance Training Performed  Yes    VAD Patient?  No    PAD/SET Patient?  No      Pain Assessment   Currently in Pain?  No/denies          Social History   Tobacco Use  Smoking Status Never Smoker  Smokeless Tobacco Never Used    Goals Met:  Independence with exercise equipment Exercise tolerated well No report of cardiac concerns or symptoms Strength training completed today  Goals Unmet:  Not Applicable  Comments: Pt able to follow exercise prescription today without complaint.  Will continue to monitor for progression.    Dr. Emily Filbert is Medical Director for [place] and [name].

## 2019-04-10 ENCOUNTER — Encounter: Payer: Medicare Other | Admitting: *Deleted

## 2019-04-10 DIAGNOSIS — Z951 Presence of aortocoronary bypass graft: Secondary | ICD-10-CM

## 2019-04-10 NOTE — Progress Notes (Signed)
Pt sent home as she was currently being tested for COVID

## 2019-04-16 ENCOUNTER — Other Ambulatory Visit: Payer: Self-pay

## 2019-04-16 ENCOUNTER — Encounter: Payer: Medicare Other | Admitting: *Deleted

## 2019-04-16 DIAGNOSIS — Z951 Presence of aortocoronary bypass graft: Secondary | ICD-10-CM

## 2019-04-16 NOTE — Progress Notes (Signed)
Daily Session Note  Patient Details  Name: Andrea [name] MRN: [id number] Date of Birth: 01/05/1946 Referring Provider:     Cardiac Rehab from 01/28/2019 in [facility] Cardiac and Pulmonary Rehab  Referring Provider  Miller, Paula MD      Encounter Date: 04/16/2019  Check In: Session Check In - 04/16/19 1552      Check-In   Supervising physician immediately available to respond to emergencies  See telemetry face sheet for immediately available ER MD    Location  [facility]    Staff Present  Amanda Sommer, BA, ACSM CEP, Exercise Physiologist;Melissa Caiola RDN, LDN; , RN BSN    Virtual Visit  No    Medication changes reported      No    Fall or balance concerns reported     No    Warm-up and Cool-down  Performed on first and last piece of equipment    Resistance Training Performed  Yes    VAD Patient?  No    PAD/SET Patient?  No      Pain Assessment   Currently in Pain?  No/denies    Multiple Pain Sites  No          Social History   Tobacco Use  Smoking Status Never Smoker  Smokeless Tobacco Never Used    Goals Met:  Independence with exercise equipment Exercise tolerated well No report of cardiac concerns or symptoms Strength training completed today  Goals Unmet:  Not Applicable  Comments: Pt able to follow exercise prescription today without complaint.  Will continue to monitor for progression.    Dr. Mark Miller is Medical Director for [facility] and [name]. 

## 2019-04-17 ENCOUNTER — Encounter: Payer: Medicare Other | Admitting: *Deleted

## 2019-04-17 DIAGNOSIS — Z951 Presence of aortocoronary bypass graft: Secondary | ICD-10-CM | POA: Diagnosis not present

## 2019-04-17 NOTE — Progress Notes (Signed)
Daily Session Note  Patient Details  Name: Andrea [name] MRN: [id number] Date of Birth: 02/14/1946 Referring Provider:     Cardiac Rehab from 01/28/2019 in [facility] and [facility] Re[facility]  Referring Provider  Miller, Paula MD      Encounter Date: 04/17/2019  Check In: Session Check In - 04/17/19 1519      Check-In   Supervising physician immediately available to respond to emergencies  See telemetry face sheet for immediately available ER MD    Location  [facility]    Staff Present  Joseph Hood RCP,RRT,BSRT;Carroll Enterkin, RN, BSN-BC, CCRP;Amanda Sommer, BA, ACSM CEP, Exercise Physiologist    Virtual Visit  No    Medication changes reported      No    Fall or balance concerns reported     No    Warm-up and Cool-down  Performed on first and last piece of equipment    Resistance Training Performed  Yes    VAD Patient?  No    PAD/SET Patient?  No      Pain Assessment   Currently in Pain?  No/denies          Social History   Tobacco Use  Smoking Status Never Smoker  Smokeless Tobacco Never Used    Goals Met:  Independence with exercise equipment Exercise tolerated well No report of cardiac concerns or symptoms Strength training completed today  Goals Unmet:  Not Applicable  Comments: Pt able to follow exercise prescription today without complaint.  Will continue to monitor for progression.    Dr. Mark Miller is Medical Director for [facility] and [name]. 

## 2019-04-21 ENCOUNTER — Encounter: Payer: Medicare Other | Admitting: *Deleted

## 2019-04-21 ENCOUNTER — Other Ambulatory Visit: Payer: Self-pay

## 2019-04-21 DIAGNOSIS — Z951 Presence of aortocoronary bypass graft: Secondary | ICD-10-CM

## 2019-04-21 NOTE — Progress Notes (Signed)
Daily Session Note  Patient Details  Name: Andrea [name] MRN: [id number] Date of Birth: Dec 22, 1945 Referring Provider:     Cardiac Rehab from 01/28/2019 in [facility] Cardiac and Pulmonary Rehab  Referring Provider  Chong Sicilian MD      Encounter Date: 04/21/2019  Check In: Session Check In - 04/21/19 1545      Check-In   Supervising physician immediately available to respond to emergencies  See telemetry face sheet for immediately available ER MD    Location  [facility]    Staff Present  Renita Papa, RN Moises Blood, BS, ACSM CEP, Exercise Physiologist;Joseph Tessie Fass RCP,RRT,BSRT    Virtual Visit  No    Medication changes reported      No    Fall or balance concerns reported     No    Warm-up and Cool-down  Performed on first and last piece of equipment    Resistance Training Performed  Yes    VAD Patient?  No    PAD/SET Patient?  No      Pain Assessment   Currently in Pain?  No/denies          Social History   Tobacco Use  Smoking Status Never Smoker  Smokeless Tobacco Never Used    Goals Met:  Independence with exercise equipment Exercise tolerated well No report of cardiac concerns or symptoms Strength training completed today  Goals Unmet:  Not Applicable  Comments: Pt able to follow exercise prescription today without complaint.  Will continue to monitor for progression.    Dr. Emily Filbert is Medical Director for [place] and [name].

## 2019-04-23 ENCOUNTER — Encounter: Payer: Medicare Other | Admitting: *Deleted

## 2019-04-23 ENCOUNTER — Other Ambulatory Visit: Payer: Self-pay

## 2019-04-23 DIAGNOSIS — Z951 Presence of aortocoronary bypass graft: Secondary | ICD-10-CM

## 2019-04-23 NOTE — Progress Notes (Signed)
Daily Session Note  Patient Details  Name: Andrea [name] MRN: [id number] Date of Birth: December 31, 1945 Referring Provider:     Cardiac Rehab from 01/28/2019 in [facility] Cardiac and Pulmonary Rehab  Referring Provider  Chong Sicilian MD      Encounter Date: 04/23/2019  Check In: Session Check In - 04/23/19 1553      Check-In   Supervising physician immediately available to respond to emergencies  See telemetry face sheet for immediately available ER MD    Location  [facility]    Staff Present  Renita Papa, RN Vickki Hearing, BA, ACSM CEP, Exercise Physiologist;Jeanna Durrell BS, Exercise Physiologist    Virtual Visit  No    Medication changes reported      No    Fall or balance concerns reported     No    Warm-up and Cool-down  Performed on first and last piece of equipment    Resistance Training Performed  Yes    VAD Patient?  No    PAD/SET Patient?  No      Pain Assessment   Currently in Pain?  No/denies          Social History   Tobacco Use  Smoking Status Never Smoker  Smokeless Tobacco Never Used    Goals Met:  Independence with exercise equipment Exercise tolerated well No report of cardiac concerns or symptoms Strength training completed today  Goals Unmet:  Not Applicable  Comments: Pt able to follow exercise prescription today without complaint.  Will continue to monitor for progression.    Dr. Emily Filbert is Medical Director for [place] and [name].

## 2019-04-28 ENCOUNTER — Encounter: Payer: Medicare Other | Attending: Cardiology

## 2019-04-28 DIAGNOSIS — Z7901 Long term (current) use of anticoagulants: Secondary | ICD-10-CM | POA: Insufficient documentation

## 2019-04-28 DIAGNOSIS — E118 Type 2 diabetes mellitus with unspecified complications: Secondary | ICD-10-CM | POA: Insufficient documentation

## 2019-04-28 DIAGNOSIS — Z951 Presence of aortocoronary bypass graft: Secondary | ICD-10-CM | POA: Insufficient documentation

## 2019-04-28 DIAGNOSIS — Z7982 Long term (current) use of aspirin: Secondary | ICD-10-CM | POA: Insufficient documentation

## 2019-04-28 DIAGNOSIS — I252 Old myocardial infarction: Secondary | ICD-10-CM | POA: Insufficient documentation

## 2019-04-28 DIAGNOSIS — N289 Disorder of kidney and ureter, unspecified: Secondary | ICD-10-CM | POA: Insufficient documentation

## 2019-04-28 DIAGNOSIS — J449 Chronic obstructive pulmonary disease, unspecified: Secondary | ICD-10-CM | POA: Insufficient documentation

## 2019-04-28 DIAGNOSIS — Z79899 Other long term (current) drug therapy: Secondary | ICD-10-CM | POA: Insufficient documentation

## 2019-04-29 DIAGNOSIS — Z951 Presence of aortocoronary bypass graft: Secondary | ICD-10-CM

## 2019-04-30 ENCOUNTER — Encounter: Payer: Self-pay | Admitting: *Deleted

## 2019-04-30 ENCOUNTER — Encounter: Payer: Medicare Other | Admitting: *Deleted

## 2019-04-30 ENCOUNTER — Other Ambulatory Visit: Payer: Self-pay

## 2019-04-30 DIAGNOSIS — J449 Chronic obstructive pulmonary disease, unspecified: Secondary | ICD-10-CM | POA: Diagnosis not present

## 2019-04-30 DIAGNOSIS — Z79899 Other long term (current) drug therapy: Secondary | ICD-10-CM | POA: Diagnosis not present

## 2019-04-30 DIAGNOSIS — Z7901 Long term (current) use of anticoagulants: Secondary | ICD-10-CM | POA: Diagnosis not present

## 2019-04-30 DIAGNOSIS — I252 Old myocardial infarction: Secondary | ICD-10-CM | POA: Diagnosis not present

## 2019-04-30 DIAGNOSIS — Z951 Presence of aortocoronary bypass graft: Secondary | ICD-10-CM

## 2019-04-30 DIAGNOSIS — E118 Type 2 diabetes mellitus with unspecified complications: Secondary | ICD-10-CM | POA: Diagnosis not present

## 2019-04-30 DIAGNOSIS — N289 Disorder of kidney and ureter, unspecified: Secondary | ICD-10-CM | POA: Diagnosis not present

## 2019-04-30 DIAGNOSIS — Z7982 Long term (current) use of aspirin: Secondary | ICD-10-CM | POA: Diagnosis not present

## 2019-04-30 NOTE — Progress Notes (Signed)
Daily Session Note  Patient Details  Name: Andrea [name] MRN: [id number] Date of Birth: 11/17/1945 Referring Provider:     Cardiac Rehab from 01/28/2019 in [facility] and Pulmonary Rehab  Referring Provider  Miller, Paula MD      Encounter Date: 04/30/2019  Check In: Session Check In - 04/30/19 1550      Check-In   Supervising physician immediately available to respond to emergencies  See telemetry face sheet for immediately available ER MD    Location  [facility]    Staff Present   , RN BSN;Amanda Sommer, BA, ACSM CEP, Exercise Physiologist;Melissa Caiola RDN, LDN    Virtual Visit  No    Medication changes reported      No    Fall or balance concerns reported     No    Warm-up and Cool-down  Performed on first and last piece of equipment    Resistance Training Performed  Yes    VAD Patient?  No    PAD/SET Patient?  No      Pain Assessment   Currently in Pain?  No/denies          Social History   Tobacco Use  Smoking Status Never Smoker  Smokeless Tobacco Never Used    Goals Met:  Independence with exercise equipment Exercise tolerated well No report of cardiac concerns or symptoms Strength training completed today  Goals Unmet:  Not Applicable  Comments: Pt able to follow exercise prescription today without complaint.  Will continue to monitor for progression.    Dr. Mark Miller is Medical Director for [facility] and [name]. 

## 2019-04-30 NOTE — Progress Notes (Signed)
Cardiac Individual Treatment Plan  Patient Details  Name: JOSEFINE FUHR MRN: [id number] Date of Birth: 1946/05/25 Referring Provider:     Cardiac Rehab from 01/28/2019 in [facility] Cardiac and Pulmonary Rehab  Referring Provider  Chong Sicilian MD      Initial Encounter Date:    Cardiac Rehab from 01/28/2019 in [facility] Cardiac and Pulmonary Rehab  Date  01/28/19      Visit Diagnosis: S/P CABG x 3  Patient's Home Medications on Admission:  Current Outpatient Medications:  .  albuterol (PROVENTIL HFA;VENTOLIN HFA) 108 (90 Base) MCG/ACT inhaler, Inhale 2 puffs into the lungs every 6 (six) hours as needed for wheezing or shortness of breath., Disp: 1 Inhaler, Rfl: 0 .  amLODipine (NORVASC) 10 MG tablet, , Disp: , Rfl:  .  aspirin EC 81 MG tablet, Take 81 mg by mouth daily., Disp: , Rfl:  .  atorvastatin (LIPITOR) 40 MG tablet, Take 40 mg by mouth at bedtime., Disp: , Rfl: 3 .  benzonatate (TESSALON) 100 MG capsule, Take 1 capsule (100 mg total) by mouth 3 (three) times daily. (Patient not taking: Reported on 01/20/2019), Disp: 20 capsule, Rfl: 0 .  budesonide-formoterol (SYMBICORT) 160-4.5 MCG/ACT inhaler, INHALE TWO PUFFS BY MOUTH TWICE DAILY, Disp: , Rfl:  .  chlorpheniramine-HYDROcodone (TUSSIONEX) 10-8 MG/5ML SUER, Take 5 mLs by mouth at bedtime as needed for cough. (Patient not taking: Reported on 12/10/2018), Disp: 115 mL, Rfl: 0 .  Cholecalciferol (VITAMIN D3) 1000 units CAPS, Take 1 capsule by mouth daily., Disp: , Rfl:  .  ELIQUIS 5 MG TABS tablet, , Disp: , Rfl:  .  fluticasone (FLONASE) 50 MCG/ACT nasal spray, 1 spray by Each Nare route daily., Disp: , Rfl:  .  furosemide (LASIX) 20 MG tablet, , Disp: , Rfl:  .  glipiZIDE (GLUCOTROL) 10 MG tablet, TAKE ONE TABLET BY MOUTH TWICE DAILY TAKE  30  MINUTES  BEFORE  A  MEAL*, Disp: , Rfl:  .  JANUVIA 50 MG tablet, , Disp: , Rfl:  .  metoprolol succinate (TOPROL-XL) 25 MG 24 hr tablet, TAKE ONE TABLET BY MOUTH ONCE DAILY, Disp: , Rfl:  .   Multiple Vitamin (MULTI-VITAMINS) TABS, Take 1 tablet by mouth daily., Disp: , Rfl:  .  nitroGLYCERIN (NITROSTAT) 0.4 MG SL tablet, PLACE 1 TABLET UNDER THE TONGUE EVERY 5 MINUTES AS NEEDED FOR CHEST PAIN, Disp: , Rfl:  .  omeprazole (PRILOSEC) 20 MG capsule, Take 1 capsule by mouth daily., Disp: , Rfl:  .  predniSONE (DELTASONE) 10 MG tablet, 4 tabs po daily for two days (Patient not taking: Reported on 12/10/2018), Disp: 8 tablet, Rfl: 0 .  sitaGLIPtin (JANUVIA) 50 MG tablet, Take 50 mg by mouth., Disp: , Rfl:  .  tiotropium (SPIRIVA HANDIHALER) 18 MCG inhalation capsule, INHALE ONE DOSE BY MOUTH ONCE DAILY, Disp: , Rfl:   Past Medical History: Past Medical History:  Diagnosis Date  . COPD (chronic obstructive pulmonary disease) ([place])   . Diabetes mellitus without complication ([place])   . Heart attack ([place])   . Renal disorder     Tobacco Use: Social History   Tobacco Use  Smoking Status Never Smoker  Smokeless Tobacco Never Used    Labs: Recent Review Flowsheet Data    Labs for ITP Cardiac and Pulmonary Rehab Latest Ref Rng & Units 09/21/2017 11/16/2017   Hemoglobin A1c 4.8 - 5.6 % 7.2(H) 7.3(H)       Exercise Target Goals: Exercise Program Goal: Individual exercise prescription set using  results from initial 6 min walk test and THRR while considering  patient's activity barriers and safety.   Exercise Prescription Goal: Initial exercise prescription builds to 30-45 minutes a day of aerobic activity, 2-3 days per week.  Home exercise guidelines will be given to patient during program as part of exercise prescription that the participant will acknowledge.  Activity Barriers & Risk Stratification: Activity Barriers & Cardiac Risk Stratification - 12/17/18 1006      Activity Barriers & Cardiac Risk Stratification   Activity Barriers  Balance Concerns;History of Falls;Assistive Device;Other (comment);Deconditioning;Muscular Weakness    Comments  Currently wearing brace for foot  after concentator fell on it, using walker    Cardiac Risk Stratification  High       6 Minute Walk: 6 Minute Walk    Row Name 01/28/19 1557         6 Minute Walk   Phase  Initial     Distance  660 feet     Walk Time  4.48 minutes     # of Rest Breaks  1     MPH  1.2     METS  2     RPE  11     Perceived Dyspnea   1     VO2 Peak  7     Symptoms  Yes (comment)     Comments  Patient reports taking a break due to being tired and thirsty from wearing a mask.     Resting HR  104 bpm     Resting BP  118/58     Resting Oxygen Saturation   92 %     Exercise Oxygen Saturation  during 6 min walk  97 %     Max Ex. HR  127 bpm     Max Ex. BP  146/70     2 Minute Post BP  120/58        Oxygen Initial Assessment: Oxygen Initial Assessment - 12/17/18 1009      Home Oxygen   Home Oxygen Device  Home Concentrator;E-Tanks;Portable Concentrator    Sleep Oxygen Prescription  Continuous    Liters per minute  2    Home Exercise Oxygen Prescription  Pulsed    Liters per minute  2    Home at Rest Exercise Oxygen Prescription  None    Compliance with Home Oxygen Use  Yes      Program Oxygen Prescription   Program Oxygen Prescription  Portable Concentrator;Pulsed    Liters per minute  2      Intervention   Short Term Goals  To learn and exhibit compliance with exercise, home and travel O2 prescription;To learn and understand importance of monitoring SPO2 with pulse oximeter and demonstrate accurate use of the pulse oximeter.;To learn and understand importance of maintaining oxygen saturations>88%;To learn and demonstrate proper pursed lip breathing techniques or other breathing techniques.;To learn and demonstrate proper use of respiratory medications    Long  Term Goals  Exhibits compliance with exercise, home and travel O2 prescription;Verbalizes importance of monitoring SPO2 with pulse oximeter and return demonstration;Maintenance of O2 saturations>88%;Compliance with respiratory  medication;Exhibits proper breathing techniques, such as pursed lip breathing or other method taught during program session;Demonstrates proper use of MDI's       Oxygen Re-Evaluation: Oxygen Re-Evaluation    Row Name 01/29/19 1700 03/26/19 1534           Program Oxygen Prescription   Program Oxygen Prescription  None;E-Tanks;Continuous  E-Tanks  Liters per minute  2  1      Comments  -  patient has been using 1 liter and has been trying to come off oxygen if she can.        Home Oxygen   Home Oxygen Device  Portable Concentrator;Home Concentrator;E-Tanks  Portable Concentrator;Home Concentrator;E-Tanks      Sleep Oxygen Prescription  Continuous  Continuous      Liters per minute  2  2      Home Exercise Oxygen Prescription  Continuous  Continuous      Liters per minute  2  2      Home at Rest Exercise Oxygen Prescription  None  None no oxygen at rest      Compliance with Home Oxygen Use  Yes  Yes        Goals/Expected Outcomes   Short Term Goals  To learn and understand importance of monitoring SPO2 with pulse oximeter and demonstrate accurate use of the pulse oximeter.;To learn and understand importance of maintaining oxygen saturations>88%;To learn and demonstrate proper pursed lip breathing techniques or other breathing techniques.;To learn and exhibit compliance with exercise, home and travel O2 prescription  To learn and demonstrate proper use of respiratory medications;To learn and demonstrate proper pursed lip breathing techniques or other breathing techniques.;To learn and understand importance of maintaining oxygen saturations>88%;To learn and understand importance of monitoring SPO2 with pulse oximeter and demonstrate accurate use of the pulse oximeter.;To learn and exhibit compliance with exercise, home and travel O2 prescription      Long  Term Goals  Compliance with respiratory medication;Exhibits proper breathing techniques, such as pursed lip breathing or other method  taught during program session;Maintenance of O2 saturations>88%;Verbalizes importance of monitoring SPO2 with pulse oximeter and return demonstration;Exhibits compliance with exercise, home and travel O2 prescription  Demonstrates proper use of MDI's;Compliance with respiratory medication;Exhibits proper breathing techniques, such as pursed lip breathing or other method taught during program session;Maintenance of O2 saturations>88%;Verbalizes importance of monitoring SPO2 with pulse oximeter and return demonstration;Exhibits compliance with exercise, home and travel O2 prescription      Comments  Reviewed PLB technique with pt.  Talked about how it work and it's important to maintaining his exercise saturations.  Patient has a pulse ox at home and checks her oxygen when she is short of breath. She is going to try to check her oxygen when she wakes up. She sleeps with it but sometimes it comes off. She states she is taking her medications regularly and does not need and help or information on them.      Goals/Expected Outcomes  Short: Become more profiecient at using PLB.   Long: Become independent at using PLB.  Short: monitor oxygen when she wakes up. Long: maintain oxygen saturation at home independently.         Oxygen Discharge (Final Oxygen Re-Evaluation): Oxygen Re-Evaluation - 03/26/19 1534      Program Oxygen Prescription   Program Oxygen Prescription  E-Tanks    Liters per minute  1    Comments  patient has been using 1 liter and has been trying to come off oxygen if she can.      Home Oxygen   Home Oxygen Device  Portable Concentrator;Home Concentrator;E-Tanks    Sleep Oxygen Prescription  Continuous    Liters per minute  2    Home Exercise Oxygen Prescription  Continuous    Liters per minute  2    Home at Rest Exercise Oxygen Prescription  None   no oxygen at rest   Compliance with Home Oxygen Use  Yes      Goals/Expected Outcomes   Short Term Goals  To learn and demonstrate  proper use of respiratory medications;To learn and demonstrate proper pursed lip breathing techniques or other breathing techniques.;To learn and understand importance of maintaining oxygen saturations>88%;To learn and understand importance of monitoring SPO2 with pulse oximeter and demonstrate accurate use of the pulse oximeter.;To learn and exhibit compliance with exercise, home and travel O2 prescription    Long  Term Goals  Demonstrates proper use of MDI's;Compliance with respiratory medication;Exhibits proper breathing techniques, such as pursed lip breathing or other method taught during program session;Maintenance of O2 saturations>88%;Verbalizes importance of monitoring SPO2 with pulse oximeter and return demonstration;Exhibits compliance with exercise, home and travel O2 prescription    Comments  Patient has a pulse ox at home and checks her oxygen when she is short of breath. She is going to try to check her oxygen when she wakes up. She sleeps with it but sometimes it comes off. She states she is taking her medications regularly and does not need and help or information on them.    Goals/Expected Outcomes  Short: monitor oxygen when she wakes up. Long: maintain oxygen saturation at home independently.       Initial Exercise Prescription: Initial Exercise Prescription - 01/28/19 1600      Date of Initial Exercise RX and Referring Provider   Date  01/28/19    Referring Provider  Chong Sicilian MD      Oxygen   Oxygen  Continuous    Liters  2      Treadmill   MPH  1.3    Grade  0    Minutes  15    METs  1.2      NuStep   Level  2    SPM  60    Minutes  15    METs  2      Arm Ergometer   Level  1    Watts  15    RPM  10    Minutes  15    METs  1.2      Biostep-RELP   Level  2    SPM  50    Minutes  15    METs  2      Prescription Details   Frequency (times per week)  3    Duration  Progress to 30 minutes of continuous aerobic without signs/symptoms of physical  distress      Intensity   THRR 40-80% of Max Heartrate  122-139    Ratings of Perceived Exertion  11-15    Perceived Dyspnea  0-4      Progression   Progression  Continue progressive overload as per policy without signs/symptoms or physical distress.      Resistance Training   Weight  3    Reps  10-15       Perform Capillary Blood Glucose checks as needed.  Exercise Prescription Changes: Exercise Prescription Changes    Row Name 01/28/19 1600 02/14/19 1000 02/25/19 1400 03/13/19 1500 03/25/19 1100     Response to Exercise   Blood Pressure (Admit)  118/58  128/60  132/60  138/60  122/56   Blood Pressure (Exercise)  146/70  140/60  164/60  142/60  152/60   Blood Pressure (Exit)  120/58  130/58  130/60  128/58  130/58   Heart Rate (Admit)  104 bpm  88 bpm  65 bpm  85 bpm  94 bpm   Heart Rate (Exercise)  127 bpm  119 bpm  130 bpm  128 bpm  112 bpm   Heart Rate (Exit)  89 bpm  86 bpm  99 bpm  109 bpm  103 bpm   Oxygen Saturation (Admit)  92 %  -  -  -  -   Oxygen Saturation (Exercise)  97 %  -  -  -  -   Oxygen Saturation (Exit)  95 %  -  -  -  -   Rating of Perceived Exertion (Exercise)  _0 Perceived Dyspnea (Exercise)  1  -  -  -  0   Symptoms  - Patient stopped to rest because she felt tired and thirsty.  -  -  -  none   Duration  Progress to 30 minutes of  aerobic without signs/symptoms of physical distress  Continue with 30 min of aerobic exercise without signs/symptoms of physical distress.  Continue with 30 min of aerobic exercise without signs/symptoms of physical distress.  Continue with 30 min of aerobic exercise without signs/symptoms of physical distress.  Continue with 30 min of aerobic exercise without signs/symptoms of physical distress.   Intensity  THRR New  THRR unchanged  THRR unchanged  THRR unchanged  THRR unchanged     Progression   Progression  Continue to progress workloads to maintain intensity without signs/symptoms of physical distress.   Continue to progress workloads to maintain intensity without signs/symptoms of physical distress.  Continue to progress workloads to maintain intensity without signs/symptoms of physical distress.  Continue to progress workloads to maintain intensity without signs/symptoms of physical distress.  Continue to progress workloads to maintain intensity without signs/symptoms of physical distress.   Average METs  -  -  2.4  2.6  2.16     Resistance Training   Training Prescription  -  Yes  Yes  Yes  Yes   Weight  -  3 lb  3 lb  3 lb  3 lbs   Reps  -  10-15  10-15  10-15  10-15     Interval Training   Interval Training  -  -  -  -  No     Oxygen   Oxygen  -  Continuous  Continuous  Continuous  Continuous   Liters  -  _1 Treadmill   MPH  -  -  -  1  1.5   Grade  -  -  -  0  0   Minutes  -  -  -  15  15   METs  -  -  -  1.77  2.15     Recumbant Bike   Level  -  _2 RPM  -  -  -  60  -   Watts  -  -  -  25  -   Minutes  -  _3 METs  -  3.11  3.11  2.3  2     NuStep   Level  -  _4 SPM  -  60  60  60  -   Minutes  -  15  15  15  15   METs  -  -  1.9  1.4  2.4     T5 Nustep   Level  -  -  -  -  4   Minutes  -  -  -  -  15   METs  -  -  -  -  2.1   Row Name 03/27/19 1600 04/08/19 1100 04/22/19 1300         Response to Exercise   Blood Pressure (Admit)  -  134/54  132/58     Blood Pressure (Exercise)  -  160/58  148/58     Blood Pressure (Exit)  -  130/56  120/58     Heart Rate (Admit)  -  109 bpm  88 bpm     Heart Rate (Exercise)  -  110 bpm  112 bpm     Heart Rate (Exit)  -  103 bpm  94 bpm     Oxygen Saturation (Admit)  -  95 %  -     Oxygen Saturation (Exercise)  -  93 %  -     Oxygen Saturation (Exit)  -  94 %  -     Rating of Perceived Exertion (Exercise)  -  12  15     Symptoms  -  SOB with VT runs  -     Duration  -  Continue with 30 min of aerobic exercise without signs/symptoms of physical distress.  Continue with 30 min of  aerobic exercise without signs/symptoms of physical distress.     Intensity  -  THRR unchanged  THRR unchanged       Progression   Progression  -  Continue to progress workloads to maintain intensity without signs/symptoms of physical distress.  Continue to progress workloads to maintain intensity without signs/symptoms of physical distress.     Average METs  -  2.78  2.7       Resistance Training   Training Prescription  -  Yes  Yes     Weight  -  3 lbs  3 lb     Reps  -  10-15  10-15       Interval Training   Interval Training  -  No  No       Oxygen   Oxygen  -  -  Continuous     Liters  -  -  1       Treadmill   MPH  -  1.5  1.5     Grade  -  0  0     Minutes  -  15  15     METs  -  2.15  2.15       Recumbant Bike   Level  -  2  2     Minutes  -  15  15     METs  -  2  3.08       NuStep   Level  -  4  4     Minutes  -  15  15     METs  -  2.4  2.3       T5 Nustep   Level  -  4  -     Minutes  -  15  -     METs  -  1.8  -       Home Exercise Plan   Plans to continue exercise at  Forensic scientist (comment) Scientist, research (medical) (comment) Scientist, research (medical) (comment) [employer]     Frequency  Add 1 additional day to program exercise sessions.  Add 2 additional days to program exercise sessions.  Add 2 additional days to program exercise sessions.     Initial Home Exercises Provided  03/27/19  03/27/19  03/27/19        Exercise Comments: Exercise Comments    Row Name 12/17/18 1016 01/29/19 1658 02/12/19 1651       Exercise Comments  Pt would like to work towards coming off her oxygen  First full day of exercise!  Patient was oriented to gym and equipment including functions, settings, policies, and procedures.  Patient's individual exercise prescription and treatment plan were reviewed.  All starting workloads were established based on the results of the 6 minute walk test done at initial orientation visit.  The plan for exercise  progression was also introduced and progression will be customized based on patient's performance and goals.  Rishika has been moving. 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   Some SOB and need for rescue inhaler with REC Bike use. Was able to complete exercise session without further problems.        Exercise Goals and Review: Exercise Goals    Row Name 12/17/18 1014 01/28/19 1611           Exercise Goals   Increase Physical Activity  Yes  Yes      Intervention  Provide advice, education, support and counseling about physical activity/exercise needs.;Develop an individualized exercise prescription for aerobic and resistive training based on initial evaluation findings, risk stratification, comorbidities and participant's personal goals.  Provide advice, education, support and counseling about physical activity/exercise needs.;Develop an individualized exercise prescription for aerobic and resistive training based on initial evaluation findings, risk stratification, comorbidities and participant's personal goals.      Expected Outcomes  Long Term: Add in home exercise to make exercise part of routine and to increase amount of physical activity.;Short Term: Attend rehab on a regular basis to increase amount of physical activity.;Long Term: Exercising regularly at least 3-5 days a week.  Long Term: Add in home exercise to make exercise part of routine and to increase amount of physical activity.;Short Term: Attend rehab on a regular basis to increase amount of physical activity.;Long Term: Exercising regularly at least 3-5 days a week.      Increase Strength and Stamina  Yes  Yes      Intervention  Provide advice, education, support and counseling about physical activity/exercise needs.;Develop an individualized exercise prescription for aerobic and resistive training based on initial evaluation findings, risk stratification, comorbidities and participant's personal goals.   Provide advice, education, support and counseling about physical activity/exercise needs.;Develop an individualized exercise prescription for aerobic and resistive training based on initial evaluation findings, risk stratification, comorbidities and participant's personal goals.      Expected Outcomes  Short Term: Increase workloads from initial exercise prescription for resistance, speed, and METs.;Short Term: Perform resistance training exercises routinely during rehab and add in resistance training at home;Long Term: Improve cardiorespiratory fitness, muscular endurance and strength as measured by increased METs and functional capacity (6MWT)  Short Term: Increase workloads from initial exercise prescription for resistance, speed, and METs.;Short Term: Perform resistance training exercises routinely during rehab and add in resistance training at home;Long Term: Improve cardiorespiratory fitness, muscular endurance and strength as measured by increased METs and functional capacity (  6MWT)      Able to understand and use rate of perceived exertion (RPE) scale  Yes  Yes      Intervention  Provide education and explanation on how to use RPE scale  Provide education and explanation on how to use RPE scale      Expected Outcomes  Short Term: Able to use RPE daily in rehab to express subjective intensity level;Long Term:  Able to use RPE to guide intensity level when exercising independently  Short Term: Able to use RPE daily in rehab to express subjective intensity level;Long Term:  Able to use RPE to guide intensity level when exercising independently      Able to understand and use Dyspnea scale  Yes  Yes      Intervention  Provide education and explanation on how to use Dyspnea scale  Provide education and explanation on how to use Dyspnea scale      Expected Outcomes  Short Term: Able to use Dyspnea scale daily in rehab to express subjective sense of shortness of breath during exertion;Long Term: Able to use  Dyspnea scale to guide intensity level when exercising independently  Short Term: Able to use Dyspnea scale daily in rehab to express subjective sense of shortness of breath during exertion;Long Term: Able to use Dyspnea scale to guide intensity level when exercising independently      Knowledge and understanding of Target Heart Rate Range (THRR)  Yes  Yes      Intervention  Provide education and explanation of THRR including how the numbers were predicted and where they are located for reference  Provide education and explanation of THRR including how the numbers were predicted and where they are located for reference      Expected Outcomes  Short Term: Able to state/look up THRR;Short Term: Able to use daily as guideline for intensity in rehab;Long Term: Able to use THRR to govern intensity when exercising independently  Short Term: Able to state/look up THRR;Short Term: Able to use daily as guideline for intensity in rehab;Long Term: Able to use THRR to govern intensity when exercising independently      Able to check pulse independently  Yes  Yes      Intervention  Provide education and demonstration on how to check pulse in carotid and radial arteries.;Review the importance of being able to check your own pulse for safety during independent exercise  Provide education and demonstration on how to check pulse in carotid and radial arteries.;Review the importance of being able to check your own pulse for safety during independent exercise      Expected Outcomes  Short Term: Able to explain why pulse checking is important during independent exercise;Long Term: Able to check pulse independently and accurately  Short Term: Able to explain why pulse checking is important during independent exercise;Long Term: Able to check pulse independently and accurately      Understanding of Exercise Prescription  Yes  Yes      Intervention  Provide education, explanation, and written materials on patient's individual  exercise prescription  Provide education, explanation, and written materials on patient's individual exercise prescription      Expected Outcomes  Short Term: Able to explain program exercise prescription;Long Term: Able to explain home exercise prescription to exercise independently  Short Term: Able to explain program exercise prescription;Long Term: Able to explain home exercise prescription to exercise independently         Exercise Goals Re-Evaluation : Exercise Goals Re-Evaluation  [place] Name 01/07/19 1059 01/29/19 1658 02/14/19 1049 02/20/19 1605 02/25/19 1501     Exercise Goal Re-Evaluation   Exercise Goals Review  Increase Physical Activity;Increase Strength and Stamina;Understanding of Exercise Prescription  Able to understand and use rate of perceived exertion (RPE) scale;Knowledge and understanding of Target Heart Rate Range (THRR);Understanding of Exercise Prescription  Increase Physical Activity;Increase Strength and Stamina;Able to understand and use rate of perceived exertion (RPE) scale;Knowledge and understanding of Target Heart Rate Range (THRR);Able to check pulse independently;Understanding of Exercise Prescription  Increase Physical Activity;Increase Strength and Stamina;Able to understand and use rate of perceived exertion (RPE) scale;Knowledge and understanding of Target Heart Rate Range (THRR);Able to check pulse independently;Understanding of Exercise Prescription  Increase Physical Activity;Increase Strength and Stamina;Able to understand and use rate of perceived exertion (RPE) scale;Knowledge and understanding of Target Heart Rate Range (THRR);Able to check pulse independently;Understanding of Exercise Prescription   Comments  Aprille has been exercising some.  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  She aims for at least 30 min every day!!  Overall, she is feeling good and eager to start rehab in the gym and get back  on a treadmill again.  Reviewed RPE scale, THR and program prescription with pt today.  Pt voiced understanding and was given a copy of goals to take home.  Takesha is doing well with exercise so far.  She works at prescribed RPE/HR ranges. Staff will monitor progress.  Denelle enjoys coming to Cardiac Rehab. Patient is very cheerful and is seeing positive. Patient has started driving. Patient was able to put her walker in the car and put oxygen in the car. Patient was unable to do any of that before the program. Patient reports that becoming easier all the time. Increasing workloads on machines weekly. Patient is not as breathless as last week in the program.  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  She wants to get off oxygen.   Expected Outcomes  Short: Continue to at least walk daily.  Long: Get started with rehab in gym.  Short: Use RPE daily to regulate intensity. Long: Follow program prescription in THR.  Short - attend class consistently Long - improve overall MET level  Short- attending class regularly, walking, and riding bike at home. Long- exercising on days off for a total of 5 days per week  Short - build syamina walking Long - increase overall MET level and get off oxygen   [place] Name 03/13/19 1546 03/25/19 1118 04/08/19 1118 04/22/19 1341       Exercise Goal Re-Evaluation   Exercise Goals Review  Increase Physical Activity;Increase Strength and Stamina;Able to understand and use rate of perceived exertion (RPE) scale;Knowledge and understanding of Target Heart Rate Range (THRR);Able to check pulse independently;Understanding of Exercise Prescription  Increase Physical Activity;Increase Strength and Stamina;Understanding of Exercise Prescription  Increase Physical Activity;Increase Strength and Stamina;Understanding of Exercise Prescription  Increase Physical Activity;Increase Strength and Stamina;Able to understand and use rate of perceived exertion (RPE) scale;Able to understand and  use Dyspnea scale;Knowledge and understanding of Target Heart Rate Range (THRR);Able to check pulse independently;Understanding of Exercise Prescription    Comments  Freida has done well on the TM.  She has reduced oxygen to 1L and sats remained above 88.  Nashae is doing well in rehab.  She continues to have good saturations on 1L of oxygen.  We will try to her  off her oxygen on the seated equipment.  If she does well, we will try to increase her workloads as well. We will continue to monitor her progress.  Nyliah has been doing well in rehab.  She is still using 1L most days.  Yesterday, she was very SOB as she was having small runs of VT.  She knows that exercise makes her feel better so she came to class to exercise.  We will continue to monitor her progress.  Erisa oxygen has been staying 93 and above with exercise.  she attends consistently and works in [place] 11-15    Expected [place] - continue to use TM Long increase MET level and get off oxygen  Short: Try room air on seated equipment.  Long: Continue to improve stamina.  Short: Continue to work on breathing better.  Long: Continue to improve stamina.  Short - continue to attend consistently Long : be able to exercise without supplemental oxygen       Discharge Exercise Prescription (Final Exercise Prescription Changes): Exercise Prescription Changes - 04/22/19 1300      Response to Exercise   Blood Pressure (Admit)  132/58    Blood Pressure (Exercise)  148/58    Blood Pressure (Exit)  120/58    Heart Rate (Admit)  88 bpm    Heart Rate (Exercise)  112 bpm    Heart Rate (Exit)  94 bpm    Rating of Perceived Exertion (Exercise)  15    Duration  Continue with 30 min of aerobic exercise without signs/symptoms of physical distress.    Intensity  THRR unchanged      Progression   Progression  Continue to progress workloads to maintain intensity without signs/symptoms of physical distress.    Average METs  2.7      Resistance  Training   Training Prescription  Yes    Weight  3 lb    Reps  10-15      Interval Training   Interval Training  No      Oxygen   Oxygen  Continuous    Liters  1      Treadmill   MPH  1.5    Grade  0    Minutes  15    METs  2.15      Recumbant Bike   Level  2    Minutes  15    METs  3.08      NuStep   Level  4    Minutes  15    METs  2.3      Home Exercise Plan   Plans to continue exercise at  [employer] (comment)   [employer]   Frequency  Add 2 additional days to program exercise sessions.    Initial Home Exercises Provided  03/27/19       Nutrition:  Target Goals: Understanding of nutrition guidelines, daily intake of sodium <1565m, cholesterol <2062m calories 30% from fat and 7% or less from saturated fats, daily to have 5 or more servings of fruits and vegetables.  Biometrics:    Nutrition Therapy Plan and Nutrition Goals: Nutrition Therapy & Goals - 01/28/19 1340      Nutrition Therapy   Diet  Low Na, HH, DM diet    Protein (specify units)  60-65g    Fiber  25 grams    Whole Grain Foods  3 servings    Saturated Fats  12 max. grams    Fruits and Vegetables  5 servings/day    Sodium  1.5 grams      Personal Nutrition Goals   Nutrition Goal  ST: Adding snacks in between meals to reduce mindless eating LT: breath without O2 and lose 15 lbs (155-->140 lbs)    Comments  Pt on Lasix. Pt reports eating cheerios with 2% milk (wants to change to almond milk but doesn't like it, talked about soymilk) OR frozen pancakes (every 1-2 weeks) OR oatmeal and decaf coffee (they told her in the hospital not the have caffiene but is unsure if thats still true, told her to call her doctor and ask). Pt will drink diet soda. L chicken salad sandwich on whole wheat (used to be tomato, now cant tolerate tomatoes) with some cheetos (puffed). Will snack on cheetos now in quarentine. D chicken (doesn't like other meat) or pinto bean and broccoli casserole. Will watch  her Na and says her dr told her that her numbers are fine. Pt would like to work on the amount of snacks she eats due to boredom ; discussed midful eating and halthy snacking. Discussed HH and DM eating. Discussed increased protein and calorie needs due to her pulmonary issues and maintaining muscle mass. Pt reports sister will shop for her and is trying to get her to buy [facility] and DM friendly foods, just got her to stop bringing sweets.      Intervention Plan   Intervention  Prescribe, educate and counsel regarding individualized specific dietary modifications aiming towards targeted core components such as weight, hypertension, lipid management, diabetes, heart failure and other comorbidities.;Nutrition handout(s) given to patient.    Expected Outcomes  Short Term Goal: Understand basic principles of dietary content, such as calories, fat, sodium, cholesterol and nutrients.;Short Term Goal: A plan has been developed with personal nutrition goals set during dietitian appointment.;Long Term Goal: Adherence to prescribed nutrition plan.       Nutrition Assessments: Nutrition Assessments - 01/29/19 1128      Rate Your Plate Scores   Pre Score  39       Nutrition Goals Re-Evaluation: Nutrition Goals Re-Evaluation    [place] Name 02/24/19 1548 03/24/19 1549 04/29/19 1154         Goals   Nutrition Goal  ST:  continue healthy snacking and increase vegetable and fruit intake for now LT: breath without O2 and lose 15 lbs (155-->140 lbs)  ST:  continue healthy snacking and increase vegetable and fruit intake for now LT: breath without O2 and lose 15 lbs (155-->140 lbs)  ST:  continue healthy snacking and increase vegetable and fruit intake for now LT: breath without O2 and lose 15 lbs (155-->140 lbs)     Comment  Eating fruit and vegetables as a part of snack and meal. Vegetables like broccoli brussell sprouts, squash, celery, carrots, etc. Fruit apples, tangerines, grapes, bananas. 3 fruit/day, 4  vegetables/day. Pt eating more mindfully peanutbutter and celery. feeling full, doesn't feel as tired, and has regular bowel movements.  Eating fruit and vegetables as a part of snack and meal. Vegetables like broccoli brussell sprouts, squash, celery, carrots, etc. Fruit apples, tangerines, grapes, bananas. 3 fruit/day, 4 vegetables/day. Pt eating more mindfully peanutbutter and celery. feeling full, doesn't feel as tired, and has regular bowel movements. Continue to use healthy snacks to moderate hunger during the day and keep the variety. Pt reports not wanting to make a new goal or needing anything from this RD.  Continue with current changes     Expected Outcome  continue progress with HH eating  ST:  continue healthy snacking and increase vegetable and fruit intake for now LT: breath without O2 and lose 15 lbs (155-->140 lbs)  ST:  continue healthy snacking and increase vegetable and fruit intake for now LT: breath without O2 and lose 15 lbs (155-->140 lbs)        Nutrition Goals Discharge (Final Nutrition Goals Re-Evaluation): Nutrition Goals Re-Evaluation - 04/29/19 1154      Goals   Nutrition Goal  ST:  continue healthy snacking and increase vegetable and fruit intake for now LT: breath without O2 and lose 15 lbs (155-->140 lbs)    Comment  Continue with current changes    Expected Outcome  ST:  continue healthy snacking and increase vegetable and fruit intake for now LT: breath without O2 and lose 15 lbs (155-->140 lbs)       Psychosocial: Target Goals: Acknowledge presence or absence of significant depression and/or stress, maximize coping skills, provide positive support system. Participant is able to verbalize types and ability to use techniques and skills needed for reducing stress and depression.   Initial Review & Psychosocial Screening: Initial Psych Review & Screening - 01/20/19 1052      Initial Review   Current issues with  Current Stress Concerns;Current Sleep Concerns     Source of Stress Concerns  Chronic Illness    Comments  [place] is self isolating away from family and friends. Her MD recently told her not to go back to church because of being high risk. She misses it, but stays in contact with her church friends and her family daily. She reports not sleeping well mainly because of the concentrator's noise level. She is also getting ready to move to a bigger apartment in September which she is really excited about!      Family Dynamics   Good Support System?  Yes      Barriers   Psychosocial barriers to participate in program  There are no identifiable barriers or psychosocial needs.      Screening Interventions   Interventions  Encouraged to exercise    Expected Outcomes  Long Term Goal: Stressors or current issues are controlled or eliminated.;Short Term goal: Utilizing psychosocial counselor, staff and physician to assist with identification of specific Stressors or current issues interfering with healing process. Setting desired goal for each stressor or current issue identified.;Short Term goal: Identification and review with participant of any Quality of Life or Depression concerns found by scoring the questionnaire.;Long Term goal: The participant improves quality of Life and PHQ9 Scores as seen by post scores and/or verbalization of changes       Quality of Life Scores:   Scores of 19 and below usually indicate a poorer quality of life in these areas.  A difference of  2-3 points is a clinically meaningful difference.  A difference of 2-3 points in the total score of the Quality of Life Index has been associated with significant improvement in overall quality of life, self-image, physical symptoms, and general health in studies assessing change in quality of life.  PHQ-9: Recent Review Flowsheet Data    There is no flowsheet data to display.     Interpretation of Total Score  Total Score Depression Severity:  1-4 = Minimal depression, 5-9 = Mild  depression, 10-14 = Moderate depression, 15-19 = Moderately severe depression, 20-27 = Severe depression   Psychosocial Evaluation and Intervention:   Psychosocial Re-Evaluation: Psychosocial Re-Evaluation    [place] Name 01/07/19 [phone]  02/20/19 1546 03/26/19 1541         Psychosocial Re-Evaluation   Current issues with  Current Stress Concerns  Current Stress Concerns  None Identified     Comments  She is feeling good at home.  She is worried about her younger daughter not wearing a mask.  Her oldest is a Marine scientist and has tested positive for COVID already.  She is worried for her family, but coping the best she can.  Nyasha is stressed about moving. She is moving. She is working really hard to move all of her belongings to a bigger apartment. Her oxygen and her walker were too crowded in her old apartment. Patient reports having chest pain when working hard and being stressed and that it went away with rest. Chest pain was a 2 out of 10. Patient was encouraged to ask for help and is going to do so. Patient knows a friend that will help this weekend.  She has now moved and her stress has been lifted alot since she can get around better. She has everything moved in straightened out and has not been at ease.     Expected Outcomes  Short: Continue to talk to daughters on phone for connection and get started with rehab.  Long: Continue to stay positive.  Short: Patient will have everything moved by Monday by asking friend for help. Long: Patient will work on conserving energy by working in [employer], sitting when she can to do certain activities, and taking breaks during big house chores as needed. Patient understanding that she needs to find a new way of completing tasks then before procedure.  Short: attend [facility] regularly. Long: maintain a workout regimine to keep stress at a minumum.     Interventions  Encouraged to attend Cardiac Rehabilitation for the exercise  -  Encouraged to attend Cardiac  Rehabilitation for the exercise     Continue Psychosocial Services   Follow up required by staff  -  Follow up required by staff        Psychosocial Discharge (Final Psychosocial Re-Evaluation): Psychosocial Re-Evaluation - 03/26/19 1541      Psychosocial Re-Evaluation   Current issues with  None Identified    Comments  She has now moved and her stress has been lifted alot since she can get around better. She has everything moved in straightened out and has not been at ease.    Expected Outcomes  Short: attend [facility] regularly. Long: maintain a workout regimine to keep stress at a minumum.    Interventions  Encouraged to attend Cardiac Rehabilitation for the exercise    Continue Psychosocial Services   Follow up required by staff       Vocational Rehabilitation: Provide vocational rehab assistance to qualifying candidates.   Vocational Rehab Evaluation & Intervention: Vocational Rehab - 01/20/19 1044      Initial Vocational Rehab Evaluation & Intervention   Assessment shows need for Vocational Rehabilitation  No       Education: Education Goals: Education classes will be provided on a variety of topics geared toward better understanding of heart health and risk factor modification. Participant will state understanding/return demonstration of topics presented as noted by education test scores.  Learning Barriers/Preferences: Learning Barriers/Preferences - 01/20/19 1052      Learning Barriers/Preferences   Learning Barriers  None    Learning Preferences  None       Education Topics:  AED/CPR: - Group verbal and written instruction with the use of  models to demonstrate the basic use of the AED with the basic ABC's of resuscitation.   General Nutrition Guidelines/Fats and Fiber: -Group instruction provided by verbal, written material, models and posters to present the general guidelines for heart healthy nutrition. Gives an explanation and review of dietary fats and  fiber.   Controlling Sodium/Reading Food Labels: -Group verbal and written material supporting the discussion of sodium use in heart healthy nutrition. Review and explanation with models, verbal and written materials for utilization of the food label.   Exercise Physiology & General Exercise Guidelines: - Group verbal and written instruction with models to review the exercise physiology of the cardiovascular system and associated critical values. Provides general exercise guidelines with specific guidelines to those with heart or lung disease.    Aerobic Exercise & Resistance Training: - Gives group verbal and written instruction on the various components of exercise. Focuses on aerobic and resistive training programs and the benefits of this training and how to safely progress through these programs..   Cardiac Rehab from 04/17/2019 in [facility] Cardiac and Pulmonary Rehab  Date  04/17/19  Educator  [facility]  Instruction Review Code  1- Verbalizes Understanding      Flexibility, Balance, Mind/Body Relaxation: Provides group verbal/written instruction on the benefits of flexibility and balance training, including mind/body exercise modes such as yoga, pilates and tai chi.  Demonstration and skill practice provided.   Stress and Anxiety: - Provides group verbal and written instruction about the health risks of elevated stress and causes of high stress.  Discuss the correlation between heart/lung disease and anxiety and treatment options. Review healthy ways to manage with stress and anxiety.   Depression: - Provides group verbal and written instruction on the correlation between heart/lung disease and depressed mood, treatment options, and the stigmas associated with seeking treatment.   Anatomy & Physiology of the Heart: - Group verbal and written instruction and models provide basic cardiac anatomy and physiology, with the coronary electrical and arterial systems. Review of Valvular disease  and Heart Failure   Cardiac Procedures: - Group verbal and written instruction to review commonly prescribed medications for heart disease. Reviews the medication, class of the drug, and side effects. Includes the steps to properly store meds and maintain the prescription regimen. (beta blockers and nitrates)   Cardiac Medications I: - Group verbal and written instruction to review commonly prescribed medications for heart disease. Reviews the medication, class of the drug, and side effects. Includes the steps to properly store meds and maintain the prescription regimen.   Cardiac Medications II: -Group verbal and written instruction to review commonly prescribed medications for heart disease. Reviews the medication, class of the drug, and side effects. (all other drug classes)   Cardiac Rehab from 04/17/2019 in [facility] Cardiac and Pulmonary Rehab  Date  04/17/19  Educator  [facility]  Instruction Review Code  1- Verbalizes Understanding       Go Sex-Intimacy & Heart Disease, Get SMART - Goal Setting: - Group verbal and written instruction through game format to discuss heart disease and the return to sexual intimacy. Provides group verbal and written material to discuss and apply goal setting through the application of the S.M.A.R.T. Method.   Other Matters of the Heart: - Provides group verbal, written materials and models to describe Stable Angina and Peripheral Artery. Includes description of the disease process and treatment options available to the cardiac patient.   Exercise & Equipment Safety: - Individual verbal instruction and demonstration of equipment use and  safety with use of the equipment.   Cardiac Rehab from 04/17/2019 in [facility] Cardiac and Pulmonary Rehab  Date  01/28/19  Educator  [place]  Instruction Review Code  1- Verbalizes Understanding      Infection Prevention: - Provides verbal and written material to individual with discussion of infection control including proper  hand washing and proper equipment cleaning during exercise session.   Cardiac Rehab from 04/17/2019 in [facility] Cardiac and Pulmonary Rehab  Date  01/28/19  Educator  [place]  Instruction Review Code  1- Verbalizes Understanding      Falls Prevention: - Provides verbal and written material to individual with discussion of falls prevention and safety.   Cardiac Rehab from 04/17/2019 in [facility] Cardiac and Pulmonary Rehab  Date  01/28/19  Educator  [place]  Instruction Review Code  1- Verbalizes Understanding      Diabetes: - Individual verbal and written instruction to review signs/symptoms of diabetes, desired ranges of glucose level fasting, after meals and with exercise. Acknowledge that pre and post exercise glucose checks will be done for 3 sessions at entry of program.   Cardiac Rehab from 04/17/2019 in [facility] Cardiac and Pulmonary Rehab  Date  01/28/19  Educator  [place]  Instruction Review Code  1- Verbalizes Understanding      Know Your Numbers and Risk Factors: -Group verbal and written instruction about important numbers in your health.  Discussion of what are risk factors and how they play a role in the disease process.  Review of Cholesterol, Blood Pressure, Diabetes, and BMI and the role they play in your overall health.   Cardiac Rehab from 04/17/2019 in [facility] Cardiac and Pulmonary Rehab  Date  04/17/19  Educator  [facility]  Instruction Review Code  1- Verbalizes Understanding      Sleep Hygiene: -Provides group verbal and written instruction about how sleep can affect your health.  Define sleep hygiene, discuss sleep cycles and impact of sleep habits. Review good sleep hygiene tips.    Other: -Provides group and verbal instruction on various topics (see comments)   Knowledge Questionnaire Score:   Core Components/Risk Factors/Patient Goals at Admission: Personal Goals and Risk Factors at Admission - 12/17/18 1009      Core Components/Risk Factors/Patient Goals on Admission    Weight  Management  Yes;Weight Loss    Intervention  Weight Management: Develop a combined nutrition and exercise program designed to reach desired caloric intake, while maintaining appropriate intake of nutrient and fiber, sodium and fats, and appropriate energy expenditure required for the weight goal.;Weight Management: Provide education and appropriate resources to help participant work on and attain dietary goals.    Expected Outcomes  Short Term: Continue to assess and modify interventions until short term weight is achieved;Long Term: Adherence to nutrition and physical activity/exercise program aimed toward attainment of established weight goal;Weight Loss: Understanding of general recommendations for a balanced deficit meal plan, which promotes 1-2 lb weight loss per week and includes a negative energy balance of [phone] kcal/d;Understanding recommendations for meals to include 15-35% energy as protein, 25-35% energy from fat, 35-60% energy from carbohydrates, less than 242m of dietary cholesterol, 20-35 gm of total fiber daily;Understanding of distribution of calorie intake throughout the day with the consumption of 4-5 meals/snacks    Diabetes  Yes    Intervention  Provide education about signs/symptoms and action to take for hypo/hyperglycemia.;Provide education about proper nutrition, including hydration, and aerobic/resistive exercise prescription along with prescribed medications to achieve blood glucose in normal ranges:  Fasting glucose 65-99 mg/dL    Expected Outcomes  Short Term: Participant verbalizes understanding of the signs/symptoms and immediate care of hyper/hypoglycemia, proper foot care and importance of medication, aerobic/resistive exercise and nutrition plan for blood glucose control.;Long Term: Attainment of HbA1C < 7%.    Hypertension  Yes    Intervention  Provide education on lifestyle modifcations including regular physical activity/exercise, weight management, moderate sodium  restriction and increased consumption of fresh fruit, vegetables, and low fat dairy, alcohol moderation, and smoking cessation.;Monitor prescription use compliance.    Expected Outcomes  Short Term: Continued assessment and intervention until BP is < 140/77m HG in hypertensive participants. < 130/822mHG in hypertensive participants with diabetes, heart failure or chronic kidney disease.;Long Term: Maintenance of blood pressure at goal levels.    Lipids  Yes    Intervention  Provide education and support for participant on nutrition & aerobic/resistive exercise along with prescribed medications to achieve LDL <7066mHDL >20m81m  Expected Outcomes  Long Term: Cholesterol controlled with medications as prescribed, with individualized exercise RX and with personalized nutrition plan. Value goals: LDL < 70mg86mL > 40 mg.;Short Term: Participant states understanding of desired cholesterol values and is compliant with medications prescribed. Participant is following exercise prescription and nutrition guidelines.       Core Components/Risk Factors/Patient Goals Review:  Goals and Risk Factor Review    Row Name 01/07/19 1112 01/20/19 1046 01/28/19 1612 02/20/19 1554 03/26/19 1544     Core Components/Risk Factors/Patient Goals Review   Personal Goals Review  Weight Management/Obesity;Hypertension  Weight Management/Obesity;Diabetes;Hypertension  Weight Management/Obesity;Diabetes;Hypertension  Weight Management/Obesity;Diabetes;Hypertension;Other  Weight Management/Obesity;Hypertension;Diabetes;Lipids   Review  Her weight and blood pressures have been good and she has not noted any problems.  Her weight is a little up, but slowly. She is keeping an eye on it, but still wants to lose weight. Her current weight is 153lb and she wants to get down to 140 lb. Her blood pressure has been running well. She feels like she is managing her diabetes well.  Her weight is a little up, but slowly. She is keeping an eye  on it, but still wants to lose weight. Her current weight is 153lb and she wants to get down to 140 lb. Her blood pressure has been running well. She feels like she is managing her diabetes well.  Patient reports no weight loss but that her clothes feel less tight and she thinks that she is firming up. Patient is determined to lose weight. Patient does have a blood pressure cuff at home and checks it once daily. BP has been elevated some when entering Cardiac Rehab but fine for exit BP. Patient checks blood glucose every morning and sometimes at night. Fasting blood sugar is in the normal range and night time blood sugar is never over 200 mg/dL. Patient takes all of her medications the way the doctor has prescribed them. Patient said the doctor is taking her off of blood thinners this month.Patient really wants to become healthy enough to stop wearing oxygen.  Patient wants to be aroung 140 pounds. She weighed 159 pounds today. She is not sure what she is eating to cause her weight going up. She states she has been eating less. Her A1C is 7.7. She states it is better than when she had her heart surgury. Genuv[place]Glipozide is what she is maintaining her blood sugars with. She checks her sugar at home regularly.   Expected Outcomes  Short and Long:  Continue to monitor risk factors.  Short: come to Cardiac Rehab to learn more about risk factors. Long: become independent with managing risk factors.  Short: come to Cardiac Rehab to learn more about risk factors. Long: become independent with managing risk factors.  Short: Patient wants to continue becoming stronger, lose weight, and needing less oxygen. Long: Patient wants to stop or decrease wearing oxygen by increasing her lung function with exercise.  Short: lose 5 pounds in two weeks. Long: maintain weight loss and continue to lose weight.      Core Components/Risk Factors/Patient Goals at Discharge (Final Review):  Goals and Risk Factor Review - 03/26/19  1544      Core Components/Risk Factors/Patient Goals Review   Personal Goals Review  Weight Management/Obesity;Hypertension;Diabetes;Lipids    Review  Patient wants to be aroung 140 pounds. She weighed 159 pounds today. She is not sure what she is eating to cause her weight going up. She states she has been eating less. Her A1C is 7.7. She states it is better than when she had her heart surgury. [place] and Glipozide is what she is maintaining her blood sugars with. She checks her sugar at home regularly.    Expected Outcomes  Short: lose 5 pounds in two weeks. Long: maintain weight loss and continue to lose weight.       ITP Comments: ITP Comments    Row Name 12/10/18 1057 12/17/18 1006 12/25/18 1004 01/07/19 1044 01/20/19 1035   ITP Comments  Initial Visit for [facility] today. Consent and intake completed. Appt made for EP and RD .  Completed initial ExRx created and sent to Dr. Emily Filbert, Medical Director to review and sign.  Kylieann contacted [place] via the [employer] App that she needs to hold off on exercise until cleared by her doctor.  Virtual appt completed.  She is feeling better and has been wearing an event monitor for 14 days. She turns it in on Thurs 01/09/19.  She has had a few episodes of "weakness" feeling and would like to wait to actually come in person until after her f/u visit on 01/27/19 with Dr. Sabra Heck.  RN Virtual orientation completed. Diagnosis can be found in CE 2/18. EP/RD scheduled for 8/4   Row Name 01/29/19 1658 02/05/19 0559 02/12/19 1650 03/05/19 0607 04/02/19 1257   ITP Comments  First full day of exercise!  Patient was oriented to gym and equipment including functions, settings, policies, and procedures.  Patient's individual exercise prescription and treatment plan were reviewed.  All starting workloads were established based on the results of the 6 minute walk test done at initial orientation visit.  The plan for exercise progression was also  introduced and progression will be customized based on patient's performance and goals.  30 Day Review Completed today. Continue with ITP unless changed by Medical Director review.  Cherine has been moving. 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   Some SOB and need for rescue inhaler with REC Bike use. Was able to complete exercise session without further problems.  30 Day review. Continue with ITP unless directed changes per Medical Director review.  30 day review completed. ITP sent to Dr. Emily Filbert, Medical Director of Cardiac and Pulmonary Rehab. Continue with ITP unless changes are made by physician.  Department closed starting 10/2 until further notice by infection prevention and Health at Work teams for [place].   [place] Name 04/07/19 1724 04/30/19 [phone]  ITP Comments  Faxed strips with PVCs to [place]. Reviewed symptoms with patient and safety.  30 day review completed. Continue with ITP sent to Dr. Emily Filbert, Medical Director of Cardiac and Pulmonary Rehab for review , changes as needed and signature.         Comments:

## 2019-05-01 ENCOUNTER — Encounter: Payer: Self-pay | Admitting: *Deleted

## 2019-05-01 DIAGNOSIS — Z951 Presence of aortocoronary bypass graft: Secondary | ICD-10-CM | POA: Diagnosis not present

## 2019-05-01 NOTE — Progress Notes (Signed)
Note from MD visit 04/28/2019   Had not received response about rhythm strips sent to physician checked for MD notes in EPIC  Palpitations HR elevated with perceived skipped beats in 11/2018. [place] 12/2018 with 31 episodes of SVT (longest 41 sec, fastest 144). Still has rare symptoms that last a few seconds and resolve on their own but not bothersome. No intervention at this time.

## 2019-05-01 NOTE — Progress Notes (Signed)
Daily Session Note  Patient Details  Name: EMORI MUMME MRN: [id number] Date of Birth: 08-18-1945 Referring Provider:     Cardiac Rehab from 01/28/2019 in [facility] Cardiac and Pulmonary Rehab  Referring Provider  Chong Sicilian MD      Encounter Date: 05/01/2019  Check In: Session Check In - 05/01/19 1549      Check-In   Supervising physician immediately available to respond to emergencies  See telemetry face sheet for immediately available ER MD    Location  [facility]-Cardiac & Pulmonary Rehab    Staff Present  Nada Maclachlan, BA, ACSM CEP, Exercise Physiologist;Leslie Castrejon RN, BSN;Joseph Hood RCP,RRT,BSRT    Virtual Visit  No    Medication changes reported      No    Fall or balance concerns reported     No    Warm-up and Cool-down  Performed on first and last piece of equipment    Resistance Training Performed  Yes    VAD Patient?  No    PAD/SET Patient?  No      Pain Assessment   Currently in Pain?  No/denies    Multiple Pain Sites  No          Social History   Tobacco Use  Smoking Status Never Smoker  Smokeless Tobacco Never Used    Goals Met:  Exercise tolerated well No report of cardiac concerns or symptoms  Goals Unmet:  Not Applicable  Comments:  6 Minute Walk    Row Name 01/28/19 1557         6 Minute Walk   Phase  Initial     Distance  660 feet     Walk Time  4.48 minutes     # of Rest Breaks  1     MPH  1.2     METS  2     RPE  11     Perceived Dyspnea   1     VO2 Peak  7     Symptoms  Yes (comment)     Comments  Patient reports taking a break due to being tired and thirsty from wearing a mask.     Resting HR  104 bpm     Resting BP  118/58     Resting Oxygen Saturation   92 %     Exercise Oxygen Saturation  during 6 min walk  97 %     Max Ex. HR  127 bpm     Max Ex. BP  146/70     2 Minute Post BP  120/58          Dr. Emily Filbert is Medical Director for [place] and [name].

## 2019-05-05 ENCOUNTER — Encounter: Payer: Medicare Other | Admitting: *Deleted

## 2019-05-05 ENCOUNTER — Other Ambulatory Visit: Payer: Self-pay

## 2019-05-05 DIAGNOSIS — Z951 Presence of aortocoronary bypass graft: Secondary | ICD-10-CM

## 2019-05-05 NOTE — Progress Notes (Signed)
Daily Session Note  Patient Details  Name: Andrea [name] MRN: [id number] Date of Birth: 05/02/1946 Referring Provider:     Cardiac Rehab from 01/28/2019 in [facility] Cardiac and Pulmonary Rehab  Referring Provider  Miller, Paula MD      Encounter Date: 05/05/2019  Check In: Session Check In - 05/05/19 1548      Check-In   Supervising physician immediately available to respond to emergencies  See telemetry face sheet for immediately available ER MD    Location  [facility]-Cardiac & Pulmonary Rehab    Staff Present  Meredith Craven, RN BSN;Jeanna Durrell BS, Exercise Physiologist;Kelly Hayes, BS, ACSM CEP, Exercise Physiologist    Virtual Visit  No    Medication changes reported      No    Fall or balance concerns reported     No    Warm-up and Cool-down  Performed on first and last piece of equipment    Resistance Training Performed  Yes    VAD Patient?  No    PAD/SET Patient?  No      Pain Assessment   Currently in Pain?  No/denies          Social History   Tobacco Use  Smoking Status Never Smoker  Smokeless Tobacco Never Used    Goals Met:  Independence with exercise equipment Exercise tolerated well No report of cardiac concerns or symptoms Strength training completed today  Goals Unmet:  Not Applicable  Comments: Pt able to follow exercise prescription today without complaint.  Will continue to monitor for progression.    Dr. Mark Miller is Medical Director for [facility] and [name]. 

## 2019-05-07 ENCOUNTER — Other Ambulatory Visit: Payer: Self-pay

## 2019-05-07 ENCOUNTER — Encounter: Payer: Medicare Other | Admitting: *Deleted

## 2019-05-07 DIAGNOSIS — Z951 Presence of aortocoronary bypass graft: Secondary | ICD-10-CM | POA: Diagnosis not present

## 2019-05-07 NOTE — Progress Notes (Signed)
Daily Session Note  Patient Details  Name: Andrea [name] MRN: [id number] Date of Birth: [birth year] Referring Provider:     Cardiac Rehab from 01/28/2019 in [facility] Cardiac and Pulmonary Rehab  Referring Provider  Chong Sicilian MD      Encounter Date: 05/07/2019  Check In: Session Check In - 05/07/19 1553      Check-In   Supervising physician immediately available to respond to emergencies  See telemetry face sheet for immediately available ER MD    Location  [facility]-Cardiac & Pulmonary Rehab    Staff Present  Renita Papa, RN BSN;Melissa Caiola RDN, LDN;Jeanna Durrell BS, Exercise Physiologist;Joseph [facility]    Virtual Visit  No    Medication changes reported      No    Fall or balance concerns reported     No    Warm-up and Cool-down  Performed on first and last piece of equipment    Resistance Training Performed  Yes    VAD Patient?  No    PAD/SET Patient?  No      Pain Assessment   Currently in Pain?  No/denies          Social History   Tobacco Use  Smoking Status Never Smoker  Smokeless Tobacco Never Used    Goals Met:  Independence with exercise equipment Exercise tolerated well No report of cardiac concerns or symptoms Strength training completed today  Goals Unmet:  Not Applicable  Comments: Pt able to follow exercise prescription today without complaint.  Will continue to monitor for progression.    Dr. Emily Filbert is Medical Director for [place] and [name].

## 2019-05-15 ENCOUNTER — Other Ambulatory Visit: Payer: Self-pay

## 2019-05-15 ENCOUNTER — Emergency Department (HOSPITAL_COMMUNITY): Payer: Medicare Other

## 2019-05-15 ENCOUNTER — Inpatient Hospital Stay (HOSPITAL_COMMUNITY)
Admission: EM | Admit: 2019-05-15 | Discharge: 2019-05-20 | DRG: 178 | Disposition: A | Discharge status: Home / Self Care | Payer: Medicare Other | Attending: Internal Medicine | Admitting: Internal Medicine

## 2019-05-15 ENCOUNTER — Encounter (HOSPITAL_COMMUNITY): Payer: Self-pay | Admitting: Emergency Medicine

## 2019-05-15 DIAGNOSIS — N189 Chronic kidney disease, unspecified: Secondary | ICD-10-CM | POA: Diagnosis present

## 2019-05-15 DIAGNOSIS — R0602 Shortness of breath: Secondary | ICD-10-CM | POA: Diagnosis not present

## 2019-05-15 DIAGNOSIS — Z7982 Long term (current) use of aspirin: Secondary | ICD-10-CM

## 2019-05-15 DIAGNOSIS — U071 COVID-19: Principal | ICD-10-CM | POA: Diagnosis present

## 2019-05-15 DIAGNOSIS — I2510 Atherosclerotic heart disease of native coronary artery without angina pectoris: Secondary | ICD-10-CM | POA: Diagnosis present

## 2019-05-15 DIAGNOSIS — E785 Hyperlipidemia, unspecified: Secondary | ICD-10-CM | POA: Diagnosis present

## 2019-05-15 DIAGNOSIS — K219 Gastro-esophageal reflux disease without esophagitis: Secondary | ICD-10-CM | POA: Diagnosis present

## 2019-05-15 DIAGNOSIS — D631 Anemia in chronic kidney disease: Secondary | ICD-10-CM | POA: Diagnosis present

## 2019-05-15 DIAGNOSIS — Z79899 Other long term (current) drug therapy: Secondary | ICD-10-CM

## 2019-05-15 DIAGNOSIS — D61818 Other pancytopenia: Secondary | ICD-10-CM | POA: Diagnosis present

## 2019-05-15 DIAGNOSIS — Z7901 Long term (current) use of anticoagulants: Secondary | ICD-10-CM

## 2019-05-15 DIAGNOSIS — Z90710 Acquired absence of both cervix and uterus: Secondary | ICD-10-CM

## 2019-05-15 DIAGNOSIS — Z951 Presence of aortocoronary bypass graft: Secondary | ICD-10-CM

## 2019-05-15 DIAGNOSIS — N183 Chronic kidney disease, stage 3 unspecified: Secondary | ICD-10-CM | POA: Diagnosis present

## 2019-05-15 DIAGNOSIS — R509 Fever, unspecified: Secondary | ICD-10-CM

## 2019-05-15 DIAGNOSIS — I252 Old myocardial infarction: Secondary | ICD-10-CM

## 2019-05-15 DIAGNOSIS — I129 Hypertensive chronic kidney disease with stage 1 through stage 4 chronic kidney disease, or unspecified chronic kidney disease: Secondary | ICD-10-CM | POA: Diagnosis present

## 2019-05-15 DIAGNOSIS — Z7951 Long term (current) use of inhaled steroids: Secondary | ICD-10-CM

## 2019-05-15 DIAGNOSIS — Z6828 Body mass index (BMI) 28.0-28.9, adult: Secondary | ICD-10-CM

## 2019-05-15 DIAGNOSIS — Z7984 Long term (current) use of oral hypoglycemic drugs: Secondary | ICD-10-CM

## 2019-05-15 DIAGNOSIS — J449 Chronic obstructive pulmonary disease, unspecified: Secondary | ICD-10-CM | POA: Diagnosis present

## 2019-05-15 DIAGNOSIS — Z9981 Dependence on supplemental oxygen: Secondary | ICD-10-CM

## 2019-05-15 DIAGNOSIS — Z86711 Personal history of pulmonary embolism: Secondary | ICD-10-CM

## 2019-05-15 DIAGNOSIS — J9611 Chronic respiratory failure with hypoxia: Secondary | ICD-10-CM | POA: Diagnosis present

## 2019-05-15 DIAGNOSIS — Z955 Presence of coronary angioplasty implant and graft: Secondary | ICD-10-CM

## 2019-05-15 DIAGNOSIS — I10 Essential (primary) hypertension: Secondary | ICD-10-CM | POA: Diagnosis present

## 2019-05-15 DIAGNOSIS — E1122 Type 2 diabetes mellitus with diabetic chronic kidney disease: Secondary | ICD-10-CM | POA: Diagnosis present

## 2019-05-15 DIAGNOSIS — E1165 Type 2 diabetes mellitus with hyperglycemia: Secondary | ICD-10-CM | POA: Diagnosis present

## 2019-05-15 DIAGNOSIS — E663 Overweight: Secondary | ICD-10-CM | POA: Diagnosis present

## 2019-05-15 DIAGNOSIS — Z8673 Personal history of transient ischemic attack (TIA), and cerebral infarction without residual deficits: Secondary | ICD-10-CM

## 2019-05-15 DIAGNOSIS — A419 Sepsis, unspecified organism: Secondary | ICD-10-CM | POA: Diagnosis present

## 2019-05-15 LAB — CBC WITH DIFFERENTIAL/PLATELET
Abs Immature Granulocytes: 0.01 10*3/uL (ref 0.00–0.07)
Basophils Absolute: 0 10*3/uL (ref 0.0–0.1)
Basophils Relative: 0 %
Eosinophils Absolute: 0.1 10*3/uL (ref 0.0–0.5)
Eosinophils Relative: 2 %
HCT: 34.5 % — ABNORMAL LOW (ref 36.0–46.0)
Hemoglobin: 11 g/dL — ABNORMAL LOW (ref 12.0–15.0)
Immature Granulocytes: 0 %
Lymphocytes Relative: 13 %
Lymphs Abs: 0.7 10*3/uL (ref 0.7–4.0)
MCH: 29.9 pg (ref 26.0–34.0)
MCHC: 31.9 g/dL (ref 30.0–36.0)
MCV: 93.8 fL (ref 80.0–100.0)
Monocytes Absolute: 0.7 10*3/uL (ref 0.1–1.0)
Monocytes Relative: 15 %
Neutro Abs: 3.5 10*3/uL (ref 1.7–7.7)
Neutrophils Relative %: 70 %
Platelets: 145 10*3/uL — ABNORMAL LOW (ref 150–400)
RBC: 3.68 MIL/uL — ABNORMAL LOW (ref 3.87–5.11)
RDW: 13.1 % (ref 11.5–15.5)
WBC: 5.1 10*3/uL (ref 4.0–10.5)
nRBC: 0 % (ref 0.0–0.2)

## 2019-05-15 NOTE — ED Provider Notes (Signed)
[facility] DEPT Provider Note   CSN: [id number] Arrival date & time: 05/15/19  2158     History   Chief Complaint Chief Complaint  Patient presents with  . Fever  . Chills    HPI Andrea [name] is a 73 y.o. female.     Patient with history of NIDDM, COPD on home O2 at 2L chronically, CAD, HTN, CKD, PE no longer anticoagulated presents with DOE that started 2 days ago, mild rhinorrhea and headache. Today she started to have fever with Tmax at home of 101.6. She has a mild cough and denies nausea, vomiting, diarrhea, abdominal or chest pain. No known exposures to COVID-19. 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   The history is provided by the patient. No language interpreter was used.  Fever Associated symptoms: cough, headaches and rhinorrhea   Associated symptoms: no chills, no myalgias and no nausea     Past Medical History:  Diagnosis Date  . COPD (chronic obstructive pulmonary disease) ([place])   . Diabetes mellitus without complication ([place])   . Heart attack ([place])   . Renal disorder     Patient Active Problem List   Diagnosis Date Noted  . Pleural effusion with transudate 09/09/2018  . Pulmonary embolism on right ([place]) 09/09/2018  . DOE (dyspnea on exertion) 08/08/2018  . 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 Acute bronchitis with chronic obstructive pulmonary disease (COPD) ([place]) 11/15/2017  . Pneumonia 09/22/2017  . H/O solitary pulmonary nodule 04/14/2014  . Chronic right hip pain 09/01/2013  . Peptic ulcer symptoms 04/04/2013  . GERD (gastroesophageal reflux disease) 10/22/2012  . Atherosclerotic heart disease of native coronary artery without angina pectoris 10/11/2012  . Malignant neoplasm of female breast ([place]) 02/16/2011  . H/O TIA (transient ischemic attack) and stroke 11/01/2009  . Chronic kidney disease 02/23/2009  .  Hypertension, benign 11/26/2008    Past Surgical History:  Procedure Laterality Date  . ABDOMINAL HYSTERECTOMY    . CARDIAC SURGERY     stents     OB History   No obstetric history on file.      Home Medications    Prior to Admission medications   Medication Sig Start Date End Date Taking? Authorizing Provider  albuterol (PROVENTIL HFA;VENTOLIN HFA) 108 (90 Base) MCG/ACT inhaler Inhale 2 puffs into the lungs every 6 (six) hours as needed for wheezing or shortness of breath. 11/18/17   Loletha Grayer, MD  amLODipine (NORVASC) 10 MG tablet  09/12/18   [provider]  aspirin EC 81 MG tablet Take 81 mg by mouth daily.    [provider]  atorvastatin (LIPITOR) 40 MG tablet Take 40 mg by mouth at bedtime. 10/09/17   [provider]  benzonatate (TESSALON) 100 MG capsule Take 1 capsule (100 mg total) by mouth 3 (three) times daily. Patient not taking: Reported on 01/20/2019 09/26/17   Loletha Grayer, MD  budesonide-formoterol [facility]) 160-4.5 MCG/ACT inhaler INHALE TWO PUFFS BY MOUTH TWICE DAILY 06/28/16   [provider]  chlorpheniramine-HYDROcodone (TUSSIONEX) 10-8 MG/5ML SUER Take 5 mLs by mouth at bedtime as needed for cough. Patient not taking: Reported on 12/10/2018 09/26/17   Loletha Grayer, MD  Cholecalciferol (VITAMIN D3) 1000 units CAPS Take 1 capsule by mouth daily.    [provider]  ELIQUIS 5 MG TABS tablet  11/13/18   [provider]  ezetimibe (ZETIA) 10 MG tablet Take by mouth. 04/28/19 04/27/20  [provider]  fluticasone (FLONASE) 50 MCG/ACT nasal spray 1 spray by Each Nare route daily. 01/03/17 01/03/18  [provider]  furosemide (LASIX) 20 MG tablet  12/04/18   [provider]  glipiZIDE (GLUCOTROL) 10 MG tablet TAKE ONE TABLET BY MOUTH TWICE DAILY TAKE  30  MINUTES  BEFORE  A  MEAL* 03/19/17   [provider]  JANUVIA 50 MG tablet  11/20/18   [provider]  metoprolol  succinate (TOPROL-XL) 25 MG 24 hr tablet TAKE ONE TABLET BY MOUTH ONCE DAILY 11/15/16   [provider]  Multiple Vitamin (MULTI-VITAMINS) TABS Take 1 tablet by mouth daily.    [provider]  nitroGLYCERIN (NITROSTAT) 0.4 MG SL tablet PLACE 1 TABLET UNDER THE TONGUE EVERY 5 MINUTES AS NEEDED FOR CHEST PAIN 01/19/17   [provider]  omeprazole (PRILOSEC) 20 MG capsule Take 1 capsule by mouth daily. 07/04/17   [provider]  predniSONE (DELTASONE) 10 MG tablet 4 tabs po daily for two days Patient not taking: Reported on 12/10/2018 11/18/17   Loletha Grayer, MD  sitaGLIPtin (JANUVIA) 50 MG tablet Take 50 mg by mouth. 07/31/17 07/31/18  [provider]  tiotropium (SPIRIVA HANDIHALER) 18 MCG inhalation capsule INHALE ONE DOSE BY MOUTH ONCE DAILY 08/01/17   [provider]    Family History No family history on file.  Social History Social History   Tobacco Use  . Smoking status: Never Smoker  . Smokeless tobacco: Never Used  Substance Use Topics  . Alcohol use: No  . Drug use: No     Allergies   Metformin and related and Sulfur   Review of Systems Review of Systems  Constitutional: Positive for fever. Negative for chills.  HENT: Positive for rhinorrhea.   Respiratory: Positive for cough and shortness of breath.   Cardiovascular: Negative.   Gastrointestinal: Negative.  Negative for abdominal pain and nausea.  Musculoskeletal: Negative.  Negative for myalgias.  Skin: Negative.   Neurological: Positive for headaches. Negative for syncope, weakness and light-headedness.     Physical Exam Updated Vital Signs BP (!) 160/62   Pulse 80   Temp 99.7 F (37.6 C) (Oral)   Resp 15   Ht 5\' 2"  (1.575 m)   Wt 71.7 kg   SpO2 100%   BMI 28.90 kg/m   Physical Exam Vitals signs and nursing note reviewed.  Constitutional:      Appearance: She is well-developed.  HENT:     Head: Normocephalic.  Neck:     Musculoskeletal: Normal  range of motion and neck supple.  Cardiovascular:     Rate and Rhythm: Normal rate and regular rhythm.     Heart sounds: No murmur.  Pulmonary:     Effort: Pulmonary effort is normal.     Breath sounds: Rales present.  Abdominal:     General: Bowel sounds are normal.     Palpations: Abdomen is soft.     Tenderness: There is no abdominal tenderness. There is no guarding or rebound.  Musculoskeletal: Normal range of motion.     Right lower leg: No edema.     Left lower leg: No edema.  Skin:    General: Skin is warm and dry.     Findings: No rash.  Neurological:     General: No focal deficit present.     Mental Status: She is alert and oriented to person, place, and time.  ED Treatments / Results  Labs (all labs ordered are listed, but only abnormal results are displayed) Labs Reviewed  CBC WITH DIFFERENTIAL/PLATELET - Abnormal; Notable for the following components:      Result Value   RBC 3.68 (*)    Hemoglobin 11.0 (*)    HCT 34.5 (*)    Platelets 145 (*)    All other components within normal limits  COMPREHENSIVE METABOLIC PANEL - Abnormal; Notable for the following components:   Glucose, Bld 154 (*)    BUN 37 (*)    Creatinine, Ser 1.92 (*)    GFR calc non Af Amer 26 (*)    GFR calc Af Amer 30 (*)    All other components within normal limits  SARS CORONAVIRUS 2 (TAT 6-24 HRS)  CULTURE, BLOOD (ROUTINE X 2)  CULTURE, BLOOD (ROUTINE X 2)  LACTIC ACID, PLASMA  LACTATE DEHYDROGENASE  TRIGLYCERIDES  LACTIC ACID, PLASMA  D-DIMER, QUANTITATIVE (NOT AT [facility])  PROCALCITONIN  FERRITIN  FIBRINOGEN  C-REACTIVE PROTEIN    EKG EKG Interpretation  Date/Time:  Thursday May 15 2019 23:01:48 EST Ventricular Rate:  79 PR Interval:    QRS Duration: 117 QT Interval:  367 QTC Calculation: 421 R Axis:   75 Text Interpretation: Sinus rhythm Nonspecific intraventricular conduction delay Baseline wander in lead(s) V3 Confirmed by Randal Buba, April ([id number]) on 05/15/2019  11:45:03 PM   Radiology Dg Chest Port 1 View  Result Date: 05/15/2019 CLINICAL DATA:  Fever and shortness of breath. EXAM: PORTABLE CHEST 1 VIEW COMPARISON:  12/23/2018 FINDINGS: Post median sternotomy.The cardiomediastinal contours are normal. Atherosclerosis of the aortic arch. Vascular stent in the region of the left upper mediastinum. Coronary stent visualized. Chronic hyperinflation. Pulmonary vasculature is normal. No consolidation, pleural effusion, or pneumothorax. No acute osseous abnormalities are seen. Surgical clips project over the right chest wall. IMPRESSION: 1. No acute abnormality. 2. Chronic hyperinflation. 3.  Aortic Atherosclerosis (ICD10-I70.0). Electronically Signed   By: Keith Rake M.D.   On: 05/15/2019 23:07    Procedures Procedures (including critical care time)  Medications Ordered in ED Medications - No data to display   Initial Impression / Assessment and Plan / ED Course  I have reviewed the triage vital signs and the nursing notes.  Pertinent labs & imaging results that were available during my care of the patient were reviewed by me and considered in my medical decision making (see chart for details).        Patient to ED with symptoms concerning for COVID - fever, cough, SOB. She is nontoxic in appearance. No respiratory distress. She is not currently requiring additional O2 more than her 2L. VSS.   Labs are essentially at baseline. She has an elevated d-dimer adding to concern for COVID infection. She is not SOB at rest, no tachycardia. Feel PE is less likely. Influenza testing was performed and is pending.   Rectal temp is 100.7. Tylenol ordered. She is comfortable on recheck. Feel with suspected COVID infection, significant comorbidities and age, she would benefit from admission to insure no respiratory decline.   Discussed with [facility] who accepts the patient onto their service.   Final Clinical Impressions(s) / ED Diagnoses   Final diagnoses:   None   1. Febrile illness 2. Dyspnea  ED Discharge Orders    None       Charlann Lange, Hershal Coria 05/16/19 [id number]    Lacretia Leigh, MD 05/19/19 1312

## 2019-05-15 NOTE — ED Triage Notes (Signed)
Symptoms of fever, cough, mild exertional shortness of breath started yesterday. Fever of 101.6 at home, did not take any medication. Denies aches, N/V/D, abd, chest pain.  Patient on home oxygen, 2 L, previous CABAG in February of 2020.

## 2019-05-16 ENCOUNTER — Encounter (HOSPITAL_COMMUNITY): Payer: Self-pay | Admitting: Internal Medicine

## 2019-05-16 DIAGNOSIS — E663 Overweight: Secondary | ICD-10-CM | POA: Diagnosis present

## 2019-05-16 DIAGNOSIS — R0602 Shortness of breath: Secondary | ICD-10-CM

## 2019-05-16 DIAGNOSIS — J449 Chronic obstructive pulmonary disease, unspecified: Secondary | ICD-10-CM | POA: Diagnosis present

## 2019-05-16 DIAGNOSIS — J9611 Chronic respiratory failure with hypoxia: Secondary | ICD-10-CM | POA: Diagnosis present

## 2019-05-16 DIAGNOSIS — Z7984 Long term (current) use of oral hypoglycemic drugs: Secondary | ICD-10-CM | POA: Diagnosis not present

## 2019-05-16 DIAGNOSIS — E1165 Type 2 diabetes mellitus with hyperglycemia: Secondary | ICD-10-CM | POA: Diagnosis present

## 2019-05-16 DIAGNOSIS — Z86711 Personal history of pulmonary embolism: Secondary | ICD-10-CM | POA: Diagnosis not present

## 2019-05-16 DIAGNOSIS — E785 Hyperlipidemia, unspecified: Secondary | ICD-10-CM | POA: Diagnosis present

## 2019-05-16 DIAGNOSIS — U071 COVID-19: Secondary | ICD-10-CM | POA: Diagnosis present

## 2019-05-16 DIAGNOSIS — Z9981 Dependence on supplemental oxygen: Secondary | ICD-10-CM | POA: Diagnosis not present

## 2019-05-16 DIAGNOSIS — I129 Hypertensive chronic kidney disease with stage 1 through stage 4 chronic kidney disease, or unspecified chronic kidney disease: Secondary | ICD-10-CM | POA: Diagnosis present

## 2019-05-16 DIAGNOSIS — Z7951 Long term (current) use of inhaled steroids: Secondary | ICD-10-CM | POA: Diagnosis not present

## 2019-05-16 DIAGNOSIS — N183 Chronic kidney disease, stage 3 unspecified: Secondary | ICD-10-CM | POA: Diagnosis present

## 2019-05-16 DIAGNOSIS — Z90710 Acquired absence of both cervix and uterus: Secondary | ICD-10-CM | POA: Diagnosis not present

## 2019-05-16 DIAGNOSIS — A419 Sepsis, unspecified organism: Secondary | ICD-10-CM | POA: Diagnosis present

## 2019-05-16 DIAGNOSIS — Z951 Presence of aortocoronary bypass graft: Secondary | ICD-10-CM | POA: Diagnosis not present

## 2019-05-16 DIAGNOSIS — D631 Anemia in chronic kidney disease: Secondary | ICD-10-CM | POA: Diagnosis present

## 2019-05-16 DIAGNOSIS — Z79899 Other long term (current) drug therapy: Secondary | ICD-10-CM | POA: Diagnosis not present

## 2019-05-16 DIAGNOSIS — I252 Old myocardial infarction: Secondary | ICD-10-CM | POA: Diagnosis not present

## 2019-05-16 DIAGNOSIS — R509 Fever, unspecified: Secondary | ICD-10-CM

## 2019-05-16 DIAGNOSIS — E1122 Type 2 diabetes mellitus with diabetic chronic kidney disease: Secondary | ICD-10-CM | POA: Diagnosis present

## 2019-05-16 DIAGNOSIS — J069 Acute upper respiratory infection, unspecified: Secondary | ICD-10-CM

## 2019-05-16 DIAGNOSIS — D61818 Other pancytopenia: Secondary | ICD-10-CM | POA: Diagnosis present

## 2019-05-16 DIAGNOSIS — Z7901 Long term (current) use of anticoagulants: Secondary | ICD-10-CM | POA: Diagnosis not present

## 2019-05-16 DIAGNOSIS — K219 Gastro-esophageal reflux disease without esophagitis: Secondary | ICD-10-CM | POA: Diagnosis present

## 2019-05-16 DIAGNOSIS — Z8673 Personal history of transient ischemic attack (TIA), and cerebral infarction without residual deficits: Secondary | ICD-10-CM | POA: Diagnosis not present

## 2019-05-16 DIAGNOSIS — Z7982 Long term (current) use of aspirin: Secondary | ICD-10-CM | POA: Diagnosis not present

## 2019-05-16 DIAGNOSIS — I2510 Atherosclerotic heart disease of native coronary artery without angina pectoris: Secondary | ICD-10-CM | POA: Diagnosis present

## 2019-05-16 LAB — URINALYSIS, ROUTINE W REFLEX MICROSCOPIC
Bacteria, UA: NONE SEEN
Bilirubin Urine: NEGATIVE
Glucose, UA: NEGATIVE mg/dL
Hgb urine dipstick: NEGATIVE
Ketones, ur: NEGATIVE mg/dL
Nitrite: NEGATIVE
Protein, ur: 30 mg/dL — AB
Specific Gravity, Urine: 1.01 (ref 1.005–1.030)
pH: 6 (ref 5.0–8.0)

## 2019-05-16 LAB — MAGNESIUM: Magnesium: 2.3 mg/dL (ref 1.7–2.4)

## 2019-05-16 LAB — C-REACTIVE PROTEIN: CRP: 0.8 mg/dL (ref <1.0)

## 2019-05-16 LAB — PROCALCITONIN
Procalcitonin: <0.1 ng/mL
Procalcitonin: <0.1 ng/mL

## 2019-05-16 LAB — CBC WITH DIFFERENTIAL/PLATELET
Abs Immature Granulocytes: 0.01 10*3/uL (ref 0.00–0.07)
Basophils Absolute: 0 10*3/uL (ref 0.0–0.1)
Basophils Relative: 1 %
Eosinophils Absolute: 0.1 10*3/uL (ref 0.0–0.5)
Eosinophils Relative: 2 %
HCT: 33.7 % — ABNORMAL LOW (ref 36.0–46.0)
Hemoglobin: 10.7 g/dL — ABNORMAL LOW (ref 12.0–15.0)
Immature Granulocytes: 0 %
Lymphocytes Relative: 25 %
Lymphs Abs: 1 10*3/uL (ref 0.7–4.0)
MCH: 29.9 pg (ref 26.0–34.0)
MCHC: 31.8 g/dL (ref 30.0–36.0)
MCV: 94.1 fL (ref 80.0–100.0)
Monocytes Absolute: 0.7 10*3/uL (ref 0.1–1.0)
Monocytes Relative: 17 %
Neutro Abs: 2.3 10*3/uL (ref 1.7–7.7)
Neutrophils Relative %: 55 %
Platelets: 143 10*3/uL — ABNORMAL LOW (ref 150–400)
RBC: 3.58 MIL/uL — ABNORMAL LOW (ref 3.87–5.11)
RDW: 13.2 % (ref 11.5–15.5)
WBC: 4.1 10*3/uL (ref 4.0–10.5)
nRBC: 0 % (ref 0.0–0.2)

## 2019-05-16 LAB — GLUCOSE, CAPILLARY
Glucose-Capillary: 235 mg/dL — ABNORMAL HIGH (ref 70–99)
Glucose-Capillary: 360 mg/dL — ABNORMAL HIGH (ref 70–99)
Glucose-Capillary: 320 mg/dL — ABNORMAL HIGH (ref 70–99)

## 2019-05-16 LAB — COMPREHENSIVE METABOLIC PANEL
ALT: 34 U/L (ref 0–44)
ALT: 31 U/L (ref 0–44)
AST: 36 U/L (ref 15–41)
AST: 42 U/L — ABNORMAL HIGH (ref 15–41)
Albumin: 3.9 g/dL (ref 3.5–5.0)
Albumin: 3.9 g/dL (ref 3.5–5.0)
Alkaline Phosphatase: 92 U/L (ref 38–126)
Alkaline Phosphatase: 84 U/L (ref 38–126)
Anion gap: 12 (ref 5–15)
Anion gap: 11 (ref 5–15)
BUN: 37 mg/dL — ABNORMAL HIGH (ref 8–23)
BUN: 38 mg/dL — ABNORMAL HIGH (ref 8–23)
CO2: 28 mmol/L (ref 22–32)
CO2: 28 mmol/L (ref 22–32)
Calcium: 9.4 mg/dL (ref 8.9–10.3)
Calcium: 9.2 mg/dL (ref 8.9–10.3)
Chloride: 98 mmol/L (ref 98–111)
Chloride: 100 mmol/L (ref 98–111)
Creatinine, Ser: 1.92 mg/dL — ABNORMAL HIGH (ref 0.44–1.00)
Creatinine, Ser: 2 mg/dL — ABNORMAL HIGH (ref 0.44–1.00)
GFR calc Af Amer: 30 mL/min — ABNORMAL LOW (ref >60)
GFR calc Af Amer: 28 mL/min — ABNORMAL LOW (ref >60)
GFR calc non Af Amer: 26 mL/min — ABNORMAL LOW (ref >60)
GFR calc non Af Amer: 24 mL/min — ABNORMAL LOW (ref >60)
Glucose, Bld: 154 mg/dL — ABNORMAL HIGH (ref 70–99)
Glucose, Bld: 90 mg/dL (ref 70–99)
Potassium: 3.5 mmol/L (ref 3.5–5.1)
Potassium: 3.7 mmol/L (ref 3.5–5.1)
Sodium: 138 mmol/L (ref 135–145)
Sodium: 139 mmol/L (ref 135–145)
Total Bilirubin: 0.3 mg/dL (ref 0.3–1.2)
Total Bilirubin: 0.7 mg/dL (ref 0.3–1.2)
Total Protein: 7.4 g/dL (ref 6.5–8.1)
Total Protein: 7.3 g/dL (ref 6.5–8.1)

## 2019-05-16 LAB — FIBRINOGEN
Fibrinogen: 596 mg/dL — ABNORMAL HIGH (ref 210–475)
Fibrinogen: 539 mg/dL — ABNORMAL HIGH (ref 210–475)

## 2019-05-16 LAB — TRIGLYCERIDES: Triglycerides: 110 mg/dL (ref <150)

## 2019-05-16 LAB — INFLUENZA PANEL BY PCR (TYPE A & B)
Influenza A By PCR: NEGATIVE
Influenza B By PCR: NEGATIVE

## 2019-05-16 LAB — LACTIC ACID, PLASMA
Lactic Acid, Venous: 0.9 mmol/L (ref 0.5–1.9)
Lactic Acid, Venous: 0.6 mmol/L (ref 0.5–1.9)

## 2019-05-16 LAB — D-DIMER, QUANTITATIVE
D-Dimer, Quant: 2.33 ug/mL-FEU — ABNORMAL HIGH (ref 0.00–0.50)
D-Dimer, Quant: 2.06 ug/mL-FEU — ABNORMAL HIGH (ref 0.00–0.50)

## 2019-05-16 LAB — PHOSPHORUS: Phosphorus: 4.2 mg/dL (ref 2.5–4.6)

## 2019-05-16 LAB — LACTATE DEHYDROGENASE
LDH: 163 U/L (ref 98–192)
LDH: 197 U/L — ABNORMAL HIGH (ref 98–192)

## 2019-05-16 LAB — FERRITIN: Ferritin: 70 ng/mL (ref 11–307)

## 2019-05-16 LAB — SEDIMENTATION RATE: Sed Rate: 46 mm/hr — ABNORMAL HIGH (ref 0–22)

## 2019-05-16 LAB — CBG MONITORING, ED: Glucose-Capillary: 79 mg/dL (ref 70–99)

## 2019-05-16 LAB — SARS CORONAVIRUS 2 (TAT 6-24 HRS): SARS Coronavirus 2: POSITIVE — AB

## 2019-05-16 MED ORDER — SODIUM CHLORIDE 0.9 % IV SOLN
200.0000 mg | Freq: Once | INTRAVENOUS | Status: AC
  Administered 2019-05-16: 200 mg via INTRAVENOUS
  Filled 2019-05-16: qty 40

## 2019-05-16 MED ORDER — INSULIN ASPART 100 UNIT/ML ~~LOC~~ SOLN
0.0000 [IU] | Freq: Three times a day (TID) | SUBCUTANEOUS | Status: DC
  Administered 2019-05-17: 8 [IU] via SUBCUTANEOUS
  Administered 2019-05-17: 3 [IU] via SUBCUTANEOUS
  Administered 2019-05-17: 8 [IU] via SUBCUTANEOUS

## 2019-05-16 MED ORDER — EZETIMIBE 10 MG PO TABS
10.0000 mg | ORAL_TABLET | Freq: Every day | ORAL | Status: DC
  Administered 2019-05-16 – 2019-05-20 (×5): 10 mg via ORAL
  Filled 2019-05-16 (×6): qty 1

## 2019-05-16 MED ORDER — PANTOPRAZOLE SODIUM 40 MG PO TBEC
40.0000 mg | DELAYED_RELEASE_TABLET | Freq: Every day | ORAL | Status: DC
  Administered 2019-05-16 – 2019-05-20 (×5): 40 mg via ORAL
  Filled 2019-05-16 (×5): qty 1

## 2019-05-16 MED ORDER — SODIUM CHLORIDE 0.9 % IV SOLN
100.0000 mg | INTRAVENOUS | Status: AC
  Administered 2019-05-17 – 2019-05-20 (×4): 100 mg via INTRAVENOUS
  Filled 2019-05-16 (×4): qty 20

## 2019-05-16 MED ORDER — ACETAMINOPHEN 325 MG PO TABS
650.0000 mg | ORAL_TABLET | Freq: Once | ORAL | Status: AC
  Administered 2019-05-16: 03:00:00 650 mg via ORAL
  Filled 2019-05-16: qty 2

## 2019-05-16 MED ORDER — SODIUM CHLORIDE 0.9% FLUSH
3.0000 mL | Freq: Two times a day (BID) | INTRAVENOUS | Status: DC
  Administered 2019-05-16 – 2019-05-20 (×8): 3 mL via INTRAVENOUS

## 2019-05-16 MED ORDER — DEXAMETHASONE SODIUM PHOSPHATE 10 MG/ML IJ SOLN
6.0000 mg | INTRAMUSCULAR | Status: DC
  Administered 2019-05-16: 6 mg via INTRAVENOUS
  Filled 2019-05-16: qty 1

## 2019-05-16 MED ORDER — INSULIN ASPART 100 UNIT/ML ~~LOC~~ SOLN
0.0000 [IU] | Freq: Every day | SUBCUTANEOUS | Status: DC
  Administered 2019-05-16 – 2019-05-17 (×2): 4 [IU] via SUBCUTANEOUS

## 2019-05-16 MED ORDER — ATORVASTATIN CALCIUM 40 MG PO TABS
40.0000 mg | ORAL_TABLET | Freq: Every day | ORAL | Status: DC
  Administered 2019-05-16 – 2019-05-19 (×4): 40 mg via ORAL
  Filled 2019-05-16 (×4): qty 1

## 2019-05-16 MED ORDER — ACETAMINOPHEN 650 MG RE SUPP: 650.0000 mg | Freq: Four times a day (QID) | RECTAL | Status: DC | PRN

## 2019-05-16 MED ORDER — METOPROLOL SUCCINATE ER 25 MG PO TB24
25.0000 mg | ORAL_TABLET | Freq: Every day | ORAL | Status: DC
  Administered 2019-05-16 – 2019-05-20 (×5): 25 mg via ORAL
  Filled 2019-05-16 (×6): qty 1

## 2019-05-16 MED ORDER — ALBUTEROL SULFATE HFA 108 (90 BASE) MCG/ACT IN AERS
2.0000 | INHALATION_SPRAY | Freq: Four times a day (QID) | RESPIRATORY_TRACT | Status: DC | PRN
  Administered 2019-05-16: 2 via RESPIRATORY_TRACT
  Filled 2019-05-16: qty 6.7

## 2019-05-16 MED ORDER — INSULIN ASPART 100 UNIT/ML ~~LOC~~ SOLN
0.0000 [IU] | Freq: Three times a day (TID) | SUBCUTANEOUS | Status: DC
  Administered 2019-05-16: 9 [IU] via SUBCUTANEOUS
  Administered 2019-05-16: 3 [IU] via SUBCUTANEOUS
  Filled 2019-05-16: qty 0.09

## 2019-05-16 MED ORDER — UMECLIDINIUM BROMIDE 62.5 MCG/INH IN AEPB
1.0000 | INHALATION_SPRAY | Freq: Every day | RESPIRATORY_TRACT | Status: DC
  Filled 2019-05-16: qty 7

## 2019-05-16 MED ORDER — MOMETASONE FURO-FORMOTEROL FUM 200-5 MCG/ACT IN AERO
2.0000 | INHALATION_SPRAY | Freq: Two times a day (BID) | RESPIRATORY_TRACT | Status: DC
  Administered 2019-05-16 – 2019-05-20 (×9): 2 via RESPIRATORY_TRACT
  Filled 2019-05-16: qty 8.8

## 2019-05-16 MED ORDER — TIOTROPIUM BROMIDE MONOHYDRATE 18 MCG IN CAPS: 18.0000 ug | ORAL_CAPSULE | Freq: Every day | RESPIRATORY_TRACT | Status: DC

## 2019-05-16 MED ORDER — SODIUM CHLORIDE 0.9 % IV SOLN
INTRAVENOUS | Status: DC
  Administered 2019-05-16 (×2): via INTRAVENOUS

## 2019-05-16 MED ORDER — FLUTICASONE PROPIONATE 50 MCG/ACT NA SUSP
1.0000 | Freq: Every day | NASAL | Status: DC
  Administered 2019-05-16 – 2019-05-20 (×5): 1 via NASAL
  Filled 2019-05-16: qty 16

## 2019-05-16 MED ORDER — INSULIN GLARGINE 100 UNIT/ML ~~LOC~~ SOLN
15.0000 [IU] | Freq: Every day | SUBCUTANEOUS | Status: DC
  Administered 2019-05-16 – 2019-05-17 (×2): 15 [IU] via SUBCUTANEOUS
  Filled 2019-05-16 (×3): qty 0.15

## 2019-05-16 MED ORDER — ACETAMINOPHEN 325 MG PO TABS: 650.0000 mg | ORAL_TABLET | Freq: Four times a day (QID) | ORAL | Status: DC | PRN

## 2019-05-16 MED ORDER — HEPARIN SODIUM (PORCINE) 5000 UNIT/ML IJ SOLN
5000.0000 [IU] | Freq: Three times a day (TID) | INTRAMUSCULAR | Status: DC
  Administered 2019-05-16 – 2019-05-20 (×11): 5000 [IU] via SUBCUTANEOUS
  Filled 2019-05-16 (×11): qty 1

## 2019-05-16 MED ORDER — ASPIRIN EC 81 MG PO TBEC
81.0000 mg | DELAYED_RELEASE_TABLET | Freq: Every day | ORAL | Status: DC
  Administered 2019-05-16 – 2019-05-20 (×5): 81 mg via ORAL
  Filled 2019-05-16 (×5): qty 1

## 2019-05-16 MED ORDER — IPRATROPIUM-ALBUTEROL 20-100 MCG/ACT IN AERS
1.0000 | INHALATION_SPRAY | Freq: Four times a day (QID) | RESPIRATORY_TRACT | Status: DC
  Administered 2019-05-16 – 2019-05-17 (×6): 1 via RESPIRATORY_TRACT
  Filled 2019-05-16: qty 4

## 2019-05-16 NOTE — Progress Notes (Signed)
Care started prior to midnight in the emergency room and patient was admitted early this morning after midnight by my colleague and partner Dr. Babs Bertin and I am in current agreement with his assessment and plan.  Additional changes to the plan of care been made accordingly.  Essentially the patient is an overweight 73 year old Caucasian female with a past medical history significant for type 2 diabetes mellitus, chronic respiratory failure in setting of COPD on 2 L of supplemental oxygen at home, hypertension, hyperlipidemia, chronic kidney disease stage III with a baseline creatinine of 1.6-1.9 as well as other comorbidities who presented to [facility] long hospital with a fever and a sepsis-like picture.  She had a T-max at home noted to be over 101 with associated chills.  She also had a nonproductive cough as well as rhinorrhea and associated mild shortness of breath which was new for her over the last day or so.  She denies any recent travel or any Covid exposures and underwent CABG in February 2020 and has been on oxygen since then.  In the ED she had a T-max of 100.7 with O2 saturations 98 to 100% on her baseline 2 L of supplemental oxygen.  Chest x-ray showed no evidence of acute cardiopulmonary process and Covid testing was ordered and was positive for SARS-CoV-2.  Patient appeared to have a sepsis-like picture but this is likely secondary to SIRS from her COVID-19 disease.  She was admitted and being treated for the following but not limited to:  Sepsis ruled Out; she has SIRS secondary to COVID-19 disease and COVID-19 infection SIRS criteria met via presenting objective fever as well as tachypnea from COVID-19 disease.  -In the setting of presenting objective fever, chills, new onset cough, new onset shortness of breath, new onset rhinorrhea, there is a clinical index of suspicion for underlying COVID-19.  Associated nasopharyngeal swab was obtained this evening, with result currently pending.   Criteria not met for the patient's sepsis to be considered severe nature given the absence of any associated evidence of endorgan damage.,  Including nonelevated presenting lactic acid.   -Of note, present chest x-rays shows no evidence of acute cardiopulmonary process, including no evidence of infiltrate or edema.  Presenting urinalysis shows insignificant pyuria in the context of the absence of urinary complaints and does show trace leukocytes, no bacteria, and 11-20 WBCs.   -Patient also denies any associated headache, neck stiffness, rash, nausea, vomiting, diarrhea, or abdominal discomfort. -Rapid influenza evaluate negative. -COVID-19 nasopharyngeal swab obtained this evening came back positive today. -Continue to monitor for results blood cultures and is pending at this time -Continue with supportive care -Trend inflammatory markers; patient CRP was 0.8, lactic acid level 0.6, procalcitonin level was less than 0.10x2, triglycerides were 70, LDH went from 163 and now is 197; ESR was 46, and D-dimer went from 2.33 and is now down to 2.06, fibrinogen level was 596 and trended down to 539 -Started the patient on dexamethasone 6 mg IV every 24 hours Plan continue fluticasone 1 spray in each nare daily, as needed albuterol inhaler 2 puffs every 6 hours as needed for wheezing or shortness breath, scheduled Combivent 1 puff every 6, as well as Dulera 2 puffs twice daily -We will hold her Incruse Ellipta -We will also give Remdesivir -Trend inflammatory markers daily and repeat chest x-ray in a.m.  Type 2 Diabetes Mellitus -Does not require exogenous insulin as an outpatient, rather, the patient was on glipizide as well as Januvia at home.   -  Presenting blood sugar per presenting CMP noted to be 154. -Hold home Januvia and glipizide during this hospitalization.  -Ordered Accu-Cheks q. before meals and at bedtime with associated sliding scale insulin. -Continue to Monitor CBG's carefully; CBGs  ranging from 79-235  Chronic hypoxic respiratory failure in the setting of COPD and now COVID 19 Disease -Patient is on 2 L continuous supplemental oxygen at baseline, without any increase in baseline oxygen requirements presentation thus far.   -Outpatient respiratory regimen include Symbicort, Spiriva, and as needed albuterol inhaler and have substituted as above  -Continue home respiratory regimen, with as needed titration of supplemental oxygen order to maintain oxygen saturations greater than or equal to 92%.    Essential Hypertension -On Norvasc as an outpatient.   -No evidence of hypotension thus far, however, in the setting of septic presentation, will hold home antihypertensive agent for now. -Continue to monitor blood pressures per protocol  Hyperlipidemia -On Zetia as well as Lipitor as an outpatient. -Continue home Lipitor and Zetia.  Stage III Chronic Kidney Disease -Associated baseline creatinine of 1.6-1.9.   -Presenting labs reflect serum creatinine with in this baseline range. -Will start Gentle IVF Hydration with NS at 75 mL/hr -Monitor strict I's and O's and daily weights.   -Attempt to avoid nephrotoxic agents and Renally Dose Medications.  -Repeat CMP in the morning.  GERD -On omeprazole as an outpatient. -Continue home PPI with Formulary Substitution of Pantoprazole   Normocytic Anemia -Likely anemia of chronic kidney disease -The patient's hemoglobin/hematocrit went from 11.0/34.5 and is now 10.7/33.7 -Check anemia panel in the a.m. -Continue to monitor for signs and symptoms of bleeding; currently no overt bleeding noted -Repeat CBC in a.m.  Thrombocytopenia  -Patient platelet count on admission was 145,000 and repeat this morning was 143,000 -Continue to monitor for signs and symptoms of bleeding: Currently no overt bleeding noted -Repeat CBC in a.m.  Elevated AST -Likely in the setting of COVID-19 disease -Continue to monitor and trend  LFTs -If necessary will obtain a right upper quadrant ultrasound -Repeat CBC in a.m.  CAD with recent CABG  -Continue with Aspirin 81 mg p.o. daily along with atorvastatin 40 mg p.o. nightly -Also will continue with metoprolol succinate 25 mg p.o. daily  Hyperlipidemia -Continue atorvastatin 40 minutes p.o. nightly and continue with ezetimibe 10 mg Daily.  We will continue to monitor patient's clinical response to intervention and repeat inflammatory markers and imaging with chest x-ray in the a.m.

## 2019-05-16 NOTE — ED Notes (Signed)
Called report to 5th floor and nurse accepted report then their charge nurse called back and stated they could not take this pt on their floor.

## 2019-05-16 NOTE — ED Notes (Addendum)
Attempted to call and give report to nurse, they stated the room is not clean.

## 2019-05-16 NOTE — H&P (Signed)
History and Physical    PLEASE NOTE THAT DRAGON DICTATION SOFTWARE WAS USED IN THE CONSTRUCTION OF THIS NOTE.   Andrea [name] ZOX:[id number] DOB: 11-26-1945 DOA: 05/15/2019  PCP: Chester Holstein, MD Patient coming from: Home  I have personally briefly reviewed patient's old medical records in [place]  Chief Complaint: Fever  HPI: Andrea [name] is a 73 y.o. female with medical history significant for type 2 diabetes mellitus, chronic hypoxic respiratory failure in the setting of COPD on 2 L continuous supplemental oxygen at home, hypertension, hyperlipidemia, stage II chronic kidney disease with baseline creatinine of 1.6-1.9, who was admitted to [facility] long hospital on 05/15/2019 with sepsis presenting from home to [facility] long emergency department complaining of objective fever.  The patient reports 1 day of subjective fever, with T-max at home noted to be 101.  She reports associated chills in the absence of full body rigors or generalized myalgias.  She also reports 1 to 2 days of nonproductive cough, in addition to rhinorrhea.  She also notes associated mild shortness of breath, which is new for her over the last 1 day, and denies any associated orthopnea, PND, or peripheral edema.  Denies any associated nausea, vomiting, diarrhea, or abdominal discomfort.  Denies any headache, neck stiffness, rash, dysuria, gross hematuria, or urinary urgency/frequency.  Denies any recent traveling or known Covid exposures.  Of note, the patient underwent CABG in February 2020.  She denies any recent chest discomfort, palpitations, diaphoresis, or dizziness.    ED Course: Signs in the emergency department were notable for the following: Temperature max 100.7, heart rate 66-89; blood pressure ranged from 129/40 8-1 60/62; respiratory rate 17-24, and oxygen saturation 98 to 100% on her baseline 2 L nasal cannula.  Labs in the ED were notable for the following: Sodium 138, bicarbonate 28,  creatinine 1.92 relative to 1.78 on 12/23/2018, glucose 154.  CBC notable for white cell count of 5100.  Lactic acid 0.9, procalcitonin less than 0.10.  Fibrinogen elevated at 596, while D-dimer was elevated at 2.33.  Urinalysis showed 11-20 white blood cells, trace leukocyte esterase, but was nitrate negative, and showed no evidence of bacteria, wall being positive for hyaline cast.  Rapid influenza was found to be negative, blood cultures x2 were collected.  COVID-19 nasopharyngeal swab was obtained, with result currently pending.  Chest x-ray showed no evidence of acute cardiopulmonary process, including no evidence infiltrate, edema, effusion, pneumothorax.  While in the ED, the patient received acetaminophen 650 mg p.o. x1.  Subsequently, the patient was admitted to [facility] long hospital for sepsis with concern for underlying Covid-19.     Review of Systems: As per HPI otherwise 10 point review of systems negative.   Past Medical History:  Diagnosis Date  . COPD (chronic obstructive pulmonary disease) ([place])   . Diabetes mellitus without complication ([place])   . Heart attack ([place])   . Renal disorder     Past Surgical History:  Procedure Laterality Date  . ABDOMINAL HYSTERECTOMY    . CARDIAC SURGERY     stents    Social History:  reports that she has never smoked. She has never used smokeless tobacco. She reports that she does not drink alcohol or use drugs.   Allergies  Allergen Reactions  . Metformin And Related Diarrhea  . Sulfur Hives    No family history on file.  Family history reviewed and not pertinent    Prior to Admission medications   Medication Sig Start Date  End Date Taking? Authorizing Provider  albuterol (PROVENTIL HFA;VENTOLIN HFA) 108 (90 Base) MCG/ACT inhaler Inhale 2 puffs into the lungs every 6 (six) hours as needed for wheezing or shortness of breath. 11/18/17  Yes Wieting, Richard, MD  amLODipine (NORVASC) 10 MG tablet Take 10 mg by mouth daily.  09/12/18  Yes  [provider]  aspirin EC 81 MG tablet Take 81 mg by mouth daily.   Yes [provider]  atorvastatin (LIPITOR) 40 MG tablet Take 40 mg by mouth at bedtime. 10/09/17  Yes [provider]  budesonide-formoterol (SYMBICORT) 160-4.5 MCG/ACT inhaler Inhale 2 puffs into the lungs 2 (two) times daily.  06/28/16  Yes [provider]  Cholecalciferol (VITAMIN D3) 1000 units CAPS Take 1 capsule by mouth daily.   Yes [provider]  ezetimibe (ZETIA) 10 MG tablet Take 10 mg by mouth daily.  04/28/19 04/27/20 Yes [provider]  fluticasone (FLONASE) 50 MCG/ACT nasal spray Place 1 spray into both nostrils daily.  01/03/17 05/16/19 Yes [provider]  furosemide (LASIX) 20 MG tablet Take 40 mg by mouth daily.  12/04/18  Yes [provider]  glipiZIDE (GLUCOTROL) 10 MG tablet Take 10 mg by mouth 2 (two) times daily before a meal.  03/19/17  Yes [provider]  JANUVIA 50 MG tablet Take 50 mg by mouth daily.  11/20/18  Yes [provider]  metoprolol succinate (TOPROL-XL) 25 MG 24 hr tablet Take 25 mg by mouth daily.  11/15/16  Yes [provider]  Multiple Vitamin (MULTI-VITAMINS) TABS Take 1 tablet by mouth daily.   Yes [provider]  nitroGLYCERIN (NITROSTAT) 0.4 MG SL tablet PLACE 1 TABLET UNDER THE TONGUE EVERY 5 MINUTES AS NEEDED FOR CHEST PAIN 01/19/17  Yes [provider]  omeprazole (PRILOSEC) 20 MG capsule Take 1 capsule by mouth daily. 07/04/17  Yes [provider]  tiotropium (SPIRIVA HANDIHALER) 18 MCG inhalation capsule Place 18 mcg into inhaler and inhale daily.  08/01/17  Yes [provider]  benzonatate (TESSALON) 100 MG capsule Take 1 capsule (100 mg total) by mouth 3 (three) times daily. Patient not taking: Reported on 01/20/2019 09/26/17   Loletha Grayer, MD  chlorpheniramine-HYDROcodone (TUSSIONEX) 10-8 MG/5ML SUER Take 5 mLs by mouth at bedtime as needed for cough.  Patient not taking: Reported on 12/10/2018 09/26/17   Loletha Grayer, MD  predniSONE (DELTASONE) 10 MG tablet 4 tabs po daily for two days Patient not taking: Reported on 12/10/2018 11/18/17   Loletha Grayer, MD     Objective    Physical Exam: Vitals:   05/16/19 0146 05/16/19 0200 05/16/19 0215 05/16/19 0230  BP:  (!) 138/44  (!) 142/47  Pulse:   75 70  Resp:  13 12 18   Temp: (!) 100.7 F (38.2 C)     TempSrc: Rectal     SpO2:   100% 98%  Weight:      Height:        General: appears to be stated age; alert, oriented Skin: warm, dry, no rash Head:  AT/[place] Eyes:  PEARL b/l, EOMI Mouth:  Oral mucosa membranes appear moist, normal dentition Neck: supple; trachea midline Heart:  RRR; did not appreciate any M/R/G Lungs: CTAB, did not appreciate any wheezes, rales, or rhonchi Abdomen: + BS; soft, ND, NT Vascular: 2+ pedal pulses b/l; 2+ radial pulses b/l Extremities: no peripheral edema, no muscle wasting   Labs on Admission: I have personally reviewed following labs and imaging studies  CBC:  Recent Labs  Lab 05/15/19 2242  WBC 5.1  NEUTROABS 3.5  HGB 11.0*  HCT 34.5*  MCV 93.8  PLT 938*   Basic Metabolic Panel: Recent Labs  Lab 05/15/19 2242  NA 138  K 3.5  CL 98  CO2 28  GLUCOSE 154*  BUN 37*  CREATININE 1.92*  CALCIUM 9.4   GFR: Estimated Creatinine Clearance: 24.5 mL/min (A) (by C-G formula based on SCr of 1.92 mg/dL (H)). Liver Function Tests: Recent Labs  Lab 05/15/19 2242  AST 36  ALT 34  ALKPHOS 92  BILITOT 0.3  PROT 7.4  ALBUMIN 3.9   No results for input(s): LIPASE, AMYLASE in the last 168 hours. No results for input(s): AMMONIA in the last 168 hours. Coagulation Profile: No results for input(s): INR, PROTIME in the last 168 hours. Cardiac Enzymes: No results for input(s): CKTOTAL, CKMB, CKMBINDEX, TROPONINI in the last 168 hours. BNP (last 3 results) No results for input(s): PROBNP in the last 8760 hours. HbA1C: No results for  input(s): HGBA1C in the last 72 hours. CBG: No results for input(s): GLUCAP in the last 168 hours. Lipid Profile: Recent Labs    05/15/19 2242  TRIG 110   Thyroid Function Tests: No results for input(s): TSH, T4TOTAL, FREET4, T3FREE, THYROIDAB in the last 72 hours. Anemia Panel: Recent Labs    05/15/19 2242  FERRITIN 70   Urine analysis:    Component Value Date/Time   COLORURINE YELLOW 05/16/2019 0158   APPEARANCEUR CLEAR 05/16/2019 0158   LABSPEC 1.010 05/16/2019 0158   PHURINE 6.0 05/16/2019 0158   GLUCOSEU NEGATIVE 05/16/2019 0158   HGBUR NEGATIVE 05/16/2019 0158   BILIRUBINUR NEGATIVE 05/16/2019 0158   KETONESUR NEGATIVE 05/16/2019 0158   PROTEINUR 30 (A) 05/16/2019 0158   NITRITE NEGATIVE 05/16/2019 0158   LEUKOCYTESUR TRACE (A) 05/16/2019 0158    Radiological Exams on Admission: Dg Chest Port 1 View  Result Date: 05/15/2019 CLINICAL DATA:  Fever and shortness of breath. EXAM: PORTABLE CHEST 1 VIEW COMPARISON:  12/23/2018 FINDINGS: Post median sternotomy.The cardiomediastinal contours are normal. Atherosclerosis of the aortic arch. Vascular stent in the region of the left upper mediastinum. Coronary stent visualized. Chronic hyperinflation. Pulmonary vasculature is normal. No consolidation, pleural effusion, or pneumothorax. No acute osseous abnormalities are seen. Surgical clips project over the right chest wall. IMPRESSION: 1. No acute abnormality. 2. Chronic hyperinflation. 3.  Aortic Atherosclerosis (ICD10-I70.0). Electronically Signed   By: Keith Rake M.D.   On: 05/15/2019 23:07     Assessment/Plan   Trine CAMREIGH MICHIE is a 73 y.o. female with medical history significant for type 2 diabetes mellitus, chronic hypoxic respiratory failure in the setting of COPD on 2 L continuous supplemental oxygen at home, hypertension, hyperlipidemia, stage II chronic kidney disease with baseline creatinine of 1.6-1.9, who was admitted to [facility] long hospital on 05/15/2019 with  sepsis presenting from home to [facility] long emergency department complaining of objective fever.    Principal Problem:   Sepsis ([place]) Active Problems:   Fever Type 2 diabetes mellitus: Chronic hypoxic respiratory failure in setting of COPD GERD Stage III CKD   #) Sepsis: SIRS criteria met via presenting objective fever as well as tachypnea.  In the setting of presenting objective fever, chills, new onset cough, new onset shortness of breath, new onset rhinorrhea, there is a clinical index of suspicion for underlying COVID-19.  Associated nasopharyngeal swab was obtained this evening, with result currently pending.  Criteria not met for the patient's sepsis to be considered  severe nature given the absence of any associated evidence of endorgan damage.,  Including nonelevated presenting lactic acid.  Of note, present chest x-rays shows no evidence of acute cardiopulmonary process, including no evidence of infiltrate or edema.  Presenting urinalysis shows insignificant pyuria in the context of the absence of urinary complaints.  Patient also denies any associated headache, neck stiffness, rash, nausea, vomiting, diarrhea, or abdominal discomfort.  If the patient is negative for COVID-19, consider initiation of antibiotics for uncomplicated urinary tract infection, with duration of antibiotic coverage of 3 to 5 days vs enhanced clinical suspicion for acute pulmonary embolism given presenting fever in the context of a history of acute pulmonary embolism in the absence of interval anticoagulation.  Rapid influenza evaluate negative.  Plan: Monitor closely for results of the COVID-19 nasopharyngeal swab obtained this evening.  Monitor for results blood cultures times 2 repeat CBC with differential in the morning.  As needed acetaminophen for subsequent fever.  Will refrain from initiation of antibiotics for now.      #) Type 2 diabetes mellitus: Does not require exogenous insulin as an outpatient, rather,  the patient was on glipizide as well as Januvia at home.  Presenting blood sugar per presenting CMP noted to be 154.  Plan: Hold home Januvia and glipizide during this hospitalization.  If ordered Accu-Cheks q. before meals and at bedtime with associated sliding scale insulin.     #) Chronic hypoxic respiratory failure in the setting of COPD: Patient is on 2 L continuous supplemental oxygen at baseline, without any increase in baseline oxygen requirements presentation thus far.  Outpatient respiratory regimen include Symbicort, Spiriva, and as needed albuterol inhaler  Plan: Continue home respiratory regimen, with as needed titration of supplemental oxygen order to maintain oxygen saturations greater than or equal to 92%.  Follow for result of COVID-19 nasopharyngeal swab.     #) Essential hypertension: On Norvasc as an outpatient.  No evidence of hypotension thus far, however, in the setting of septic presentation, will hold home antihypertensive agent for now.  Plan: Hold home Norvasc for now, as above.  Close monitoring of ensuing blood pressures via routine vital signs.  9 9#) hyperlipidemia: On Zetia as well as Lipitor as an outpatient.  Plan: Continue home Lipitor and Zetia.     #) Stage III chronic kidney disease: Associated baseline creatinine of 1.6-1.9.  Presenting labs reflect serum creatinine with in this baseline range.  Plan: Monitor strict I's and O's and daily weights.  Attempt to avoid nephrotoxic agents.  Repeat BMP in the morning.     #) GERD: On omeprazole as an outpatient.  Plan: Continue home PPI   DVT prophylaxis: Heparin 5000 units subcu 3 times daily Code Status: Full Family Communication: None Disposition Plan:  Per Rounding Team Consults called: None Admission status: Inpatient; med telemetry    PLEASE NOTE THAT DRAGON DICTATION SOFTWARE WAS USED IN THE CONSTRUCTION OF THIS NOTE.   [place] [facility] Pager [phone]  From [place].   Otherwise, please contact night-coverage  [email or web address] Password [facility]  05/16/2019, 2:43 AM

## 2019-05-16 NOTE — ED Notes (Signed)
Attempted to call and give report again, they stated the pts room is still not clean.

## 2019-05-16 NOTE — ED Notes (Signed)
Pure wick has been placed. Suction set to 45mmHg.  

## 2019-05-17 ENCOUNTER — Inpatient Hospital Stay (HOSPITAL_COMMUNITY): Payer: Medicare Other

## 2019-05-17 DIAGNOSIS — N183 Chronic kidney disease, stage 3 unspecified: Secondary | ICD-10-CM

## 2019-05-17 DIAGNOSIS — I10 Essential (primary) hypertension: Secondary | ICD-10-CM

## 2019-05-17 DIAGNOSIS — Z8673 Personal history of transient ischemic attack (TIA), and cerebral infarction without residual deficits: Secondary | ICD-10-CM

## 2019-05-17 DIAGNOSIS — I2510 Atherosclerotic heart disease of native coronary artery without angina pectoris: Secondary | ICD-10-CM

## 2019-05-17 DIAGNOSIS — D61818 Other pancytopenia: Secondary | ICD-10-CM

## 2019-05-17 LAB — GLUCOSE, CAPILLARY
Glucose-Capillary: 189 mg/dL — ABNORMAL HIGH (ref 70–99)
Glucose-Capillary: 265 mg/dL — ABNORMAL HIGH (ref 70–99)
Glucose-Capillary: 252 mg/dL — ABNORMAL HIGH (ref 70–99)
Glucose-Capillary: 321 mg/dL — ABNORMAL HIGH (ref 70–99)

## 2019-05-17 LAB — CBC WITH DIFFERENTIAL/PLATELET
Abs Immature Granulocytes: 0.02 10*3/uL (ref 0.00–0.07)
Basophils Absolute: 0 10*3/uL (ref 0.0–0.1)
Basophils Relative: 0 %
Eosinophils Absolute: 0 10*3/uL (ref 0.0–0.5)
Eosinophils Relative: 0 %
HCT: 30.6 % — ABNORMAL LOW (ref 36.0–46.0)
Hemoglobin: 9.6 g/dL — ABNORMAL LOW (ref 12.0–15.0)
Immature Granulocytes: 1 %
Lymphocytes Relative: 21 %
Lymphs Abs: 0.6 10*3/uL — ABNORMAL LOW (ref 0.7–4.0)
MCH: 29.7 pg (ref 26.0–34.0)
MCHC: 31.4 g/dL (ref 30.0–36.0)
MCV: 94.7 fL (ref 80.0–100.0)
Monocytes Absolute: 0.3 10*3/uL (ref 0.1–1.0)
Monocytes Relative: 9 %
Neutro Abs: 2.1 10*3/uL (ref 1.7–7.7)
Neutrophils Relative %: 69 %
Platelets: 122 10*3/uL — ABNORMAL LOW (ref 150–400)
RBC: 3.23 MIL/uL — ABNORMAL LOW (ref 3.87–5.11)
RDW: 13 % (ref 11.5–15.5)
WBC: 3 10*3/uL — ABNORMAL LOW (ref 4.0–10.5)
nRBC: 0 % (ref 0.0–0.2)

## 2019-05-17 LAB — SEDIMENTATION RATE: Sed Rate: 45 mm/hr — ABNORMAL HIGH (ref 0–22)

## 2019-05-17 LAB — COMPREHENSIVE METABOLIC PANEL
ALT: 31 U/L (ref 0–44)
AST: 33 U/L (ref 15–41)
Albumin: 3.4 g/dL — ABNORMAL LOW (ref 3.5–5.0)
Alkaline Phosphatase: 76 U/L (ref 38–126)
Anion gap: 11 (ref 5–15)
BUN: 41 mg/dL — ABNORMAL HIGH (ref 8–23)
CO2: 23 mmol/L (ref 22–32)
Calcium: 9.1 mg/dL (ref 8.9–10.3)
Chloride: 106 mmol/L (ref 98–111)
Creatinine, Ser: 2.04 mg/dL — ABNORMAL HIGH (ref 0.44–1.00)
GFR calc Af Amer: 28 mL/min — ABNORMAL LOW (ref >60)
GFR calc non Af Amer: 24 mL/min — ABNORMAL LOW (ref >60)
Glucose, Bld: 219 mg/dL — ABNORMAL HIGH (ref 70–99)
Potassium: 3.8 mmol/L (ref 3.5–5.1)
Sodium: 140 mmol/L (ref 135–145)
Total Bilirubin: 0.3 mg/dL (ref 0.3–1.2)
Total Protein: 6.4 g/dL — ABNORMAL LOW (ref 6.5–8.1)

## 2019-05-17 LAB — RETICULOCYTES
Immature Retic Fract: 8.6 % (ref 2.3–15.9)
RBC.: 3.19 MIL/uL — ABNORMAL LOW (ref 3.87–5.11)
Retic Count, Absolute: 48.8 10*3/uL (ref 19.0–186.0)
Retic Ct Pct: 1.5 % (ref 0.4–3.1)

## 2019-05-17 LAB — MAGNESIUM: Magnesium: 2.2 mg/dL (ref 1.7–2.4)

## 2019-05-17 LAB — VITAMIN B12: Vitamin B-12: 1274 pg/mL — ABNORMAL HIGH (ref 180–914)

## 2019-05-17 LAB — C-REACTIVE PROTEIN: CRP: <0.8 mg/dL (ref <1.0)

## 2019-05-17 LAB — FIBRINOGEN: Fibrinogen: 530 mg/dL — ABNORMAL HIGH (ref 210–475)

## 2019-05-17 LAB — D-DIMER, QUANTITATIVE: D-Dimer, Quant: 1.5 ug/mL-FEU — ABNORMAL HIGH (ref 0.00–0.50)

## 2019-05-17 LAB — IRON AND TIBC
Iron: 70 ug/dL (ref 28–170)
Saturation Ratios: 26 % (ref 10.4–31.8)
TIBC: 268 ug/dL (ref 250–450)
UIBC: 198 ug/dL

## 2019-05-17 LAB — LACTATE DEHYDROGENASE: LDH: 145 U/L (ref 98–192)

## 2019-05-17 LAB — FOLATE: Folate: 36 ng/mL (ref >5.9)

## 2019-05-17 LAB — PHOSPHORUS: Phosphorus: 4.4 mg/dL (ref 2.5–4.6)

## 2019-05-17 LAB — PROCALCITONIN: Procalcitonin: <0.1 ng/mL

## 2019-05-17 LAB — HEMOGLOBIN A1C
Hgb A1c MFr Bld: 8.9 % — ABNORMAL HIGH (ref 4.8–5.6)
Mean Plasma Glucose: 208.73 mg/dL

## 2019-05-17 LAB — FERRITIN: Ferritin: 129 ng/mL (ref 11–307)

## 2019-05-17 MED ORDER — IPRATROPIUM-ALBUTEROL 20-100 MCG/ACT IN AERS
1.0000 | INHALATION_SPRAY | Freq: Three times a day (TID) | RESPIRATORY_TRACT | Status: DC
  Administered 2019-05-18 – 2019-05-20 (×7): 1 via RESPIRATORY_TRACT

## 2019-05-17 MED ORDER — DEXAMETHASONE 4 MG PO TABS
6.0000 mg | ORAL_TABLET | Freq: Every day | ORAL | Status: DC
  Administered 2019-05-17 – 2019-05-20 (×4): 6 mg via ORAL
  Filled 2019-05-17 (×4): qty 2

## 2019-05-17 MED ORDER — AMLODIPINE BESYLATE 10 MG PO TABS
10.0000 mg | ORAL_TABLET | Freq: Every day | ORAL | Status: DC
  Administered 2019-05-17 – 2019-05-20 (×4): 10 mg via ORAL
  Filled 2019-05-17 (×4): qty 1

## 2019-05-17 NOTE — Progress Notes (Signed)
PROGRESS NOTE    Andrea [name]  QAS:[id number] DOB: [birth year] DOA: 05/15/2019 PCP: Chester Holstein, MD   Brief Narrative:  The patient is an overweight 73 year old Caucasian female with a past medical history significant for type 2 diabetes mellitus, chronic respiratory failure in setting of COPD on 2 L of supplemental oxygen at home, hypertension, hyperlipidemia, chronic kidney disease stage III with a baseline creatinine of 1.6-1.9 as well as other comorbidities who presented to [facility] with a fever and a sepsis-like picture.  She had a T-max at home noted to be over 101 with associated chills.  She also had a nonproductive cough as well as rhinorrhea and associated mild shortness of breath which was new for her over the last day or so.  She denies any recent travel or any Covid exposures and underwent CABG in February 2020 and has been on oxygen since then.  In the ED she had a T-max of 100.7 with O2 saturations 98 to 100% on her baseline 2 L of supplemental oxygen.  Chest x-ray showed no evidence of acute cardiopulmonary process and Covid testing was ordered and was positive for SARS-CoV-2.  Patient appeared to have a sepsis-like picture but this is likely secondary to SIRS from her COVID-19 disease.  Inflammatory markers are trending down patient is feeling better today.  We will have the patient ambulate with physical therapy  Assessment & Plan:   Principal Problem:   Sepsis ([place]) Active Problems:   Atherosclerotic heart disease of native coronary artery without angina pectoris   Chronic kidney disease   GERD (gastroesophageal reflux disease)   Hypertension, benign   H/O TIA (transient ischemic attack) and stroke   Fever   Pancytopenia ([place])  Sepsis ruled Out; she has SIRS secondary to COVID-19 disease and COVID-19 infection -SIRS criteria met via presenting objective fever as well as tachypnea from COVID-19 disease. -In the setting of presenting objective fever,  chills, new onset cough, new onset shortness of breath, new onset rhinorrhea, there is a clinical index of suspicion for underlying COVID-19.Associated nasopharyngeal swab was obtained this evening, with result currently pending. Criteria not met for the patient's sepsis to be considered severe nature given the absence of any associated evidence of endorgan damage., Including nonelevated presenting lactic acid.  -Of note, Admission chest x-rays shows no evidence of acute cardiopulmonary process, including no evidence of infiltrate or edema. Presenting urinalysis shows insignificant pyuria in the context of the absence of urinary complaints and does show trace leukocytes, no bacteria, and 11-20 WBCs.  -Repeat CXR this AM showed "No active disease." -Patient also denies any associated headache, neck stiffness, rash,nausea, vomiting, diarrhea, or abdominal discomfort. -Rapid influenza evaluate negative. -COVID-19 nasopharyngeal swab obtained this evening came back positive today.  -Continue to monitor for results blood cultures and is pending at this time -Continue with supportive care -Trend inflammatory markers; patient initial CRP was 0.8 and now is less than 0.8, lactic acid level 0.6, procalcitonin level was less than 0.10x2, triglycerides were 70, LDH went from 163 and now is 197 and has now gone down to 145; ESR was 46 and is now 45, and D-dimer went from 2.33 and is now down to 1.50, fibrinogen level was 596 and trended down to 530 today -Started the patient on dexamethasone 6 mg IV every 24 hours and will continue for at least 10 days -Continue fluticasone 1 spray in each nare daily, as needed albuterol inhaler 2 puffs every 6 hours as needed for wheezing  or shortness breath, scheduled Combivent 1 puff every 6, as well as Dulera 2 puffs twice daily -We will hold her Incruse Ellipta -We will also give Remdesivir 5-day treatment and she has 3 more doses of treatment after today -Trend  inflammatory markers daily and repeat chest x-ray in a.m.  Type 2 Diabetes Mellitus -Does not require exogenous insulin as an outpatient, rather, the patient was on glipizide as well as Januvia at home.  -Presenting blood sugar per presenting CMP noted to be 154.  This morning blood sugar on CMP was 219 -Hold home Januvia and glipizide during this hospitalization.  -Ordered Accu-Cheks q. before meals and at bedtime with associated sliding scale insulin. -Continue to Monitor CBG's carefully; CBGs were elevated and ranged from 79-360 -Checked a hemoglobin A1c and was 8.9 -Started the patient on insulin glargine 15 units subcu nightly -Continue to monitor blood sugars carefully and adjust insulin regimen as necessary  Chronic Hypoxic Respiratory failure in the setting of COPD and now COVID 19 Disease -Patient is on 2 L continuous supplemental oxygen at baseline, without any increase in baseline oxygen requirements presentation thus far.  -Outpatient respiratory regimen include Symbicort, Spiriva, and as needed albuterol inhaler and have substituted as above  -Continue home respiratory regimen, with as needed titration of supplemental oxygen order to maintain oxygen saturations greater than or equal to 92%.  -Patient is doing well from a respiratory standpoint but complains of having a dry hacking cough  Essential Hypertension -On Norvasc as an outpatient.  -No evidence of hypotension thus far, however, in the setting of septic presentation, will her antihypertensives were held but will resume her home amlodipine today -Continue to monitor blood pressures per protocol  Stage III Chronic Kidney Disease -Associated baseline creatinine of 1.6-1.9. -Presenting labs reflect serum creatinine with in this baseline range. -Started Gentle IVF Hydration with NS at 75 mL/hr but is now stopped; patient BUN/creatinine went from 38/2.00 and is now 41/2.04 -Monitor strict I's and O's and daily  weights.  -Attempt to avoid nephrotoxic agents and Renally Dose Medications.  -Repeat CMP in the morning.  GERD -On Omeprazole as an outpatient. -Continue home PPI with Formulary Substitution of Pantoprazole   Normocytic Anemia -Likely anemia of chronic kidney disease -The patient's hemoglobin/hematocrit went from 11.0/34.5 and is now 9.6/30.6 -Likely also partly because of a dilutional drop from IV fluid hydration -Checked anemia panel this am and showed an iron level of 70, U IBC 198, TIBC of 268, saturation ratios of 26%, ferritin level of 129, folate level 36.0, and vitamin B12 levels 1274 -Continue to monitor for signs and symptoms of bleeding; currently no overt bleeding noted -Repeat CBC in a.m.  Thrombocytopenia  -Patient platelet count on admission was 145,000 and repeat this morning was 122,000 -Continue to monitor for signs and symptoms of bleeding: Currently no overt bleeding noted -Repeat CBC in a.m.  Pancytopenia -? Dilutional Drop vs. COVID -Will save Smear in AM -Patient's WBC went from 5.1 -> 3.0, Hb/Hct is now 9.6/30.6, and Platelet Count is now 122,000  -If continues to [place] may discuss with Hematology/Oncology  -Continue to Monitor and Trend; Repeat CBC in AM   Elevated AST, improved  -Likely in the setting of COVID-19 disease; AST was 42 on admission and is now 33 -Continue to monitor and trend LFTs -If necessary will obtain a right upper quadrant ultrasound -Repeat CBC in a.m.  CAD with recent CABG  -Continue with Aspirin 81 mg p.o. daily along with atorvastatin 40 mg  p.o. nightly -Also will continue with metoprolol succinate 25 mg p.o. daily  Hyperlipidemia -Continue atorvastatin 40 minutes p.o. nightly and continue with ezetimibe 10 mg Daily.  DVT prophylaxis: Heparin 5,000 units sq q8h Code Status: FULL CODE  Family Communication: No family present at bedside  Disposition Plan: Pending further clinical improvement, evaluation by PT and  completion of Remdesivir   Consultants:   None   Procedures:  None  Antimicrobials: Anti-infectives (From admission, onward)   Start     Dose/Rate Route Frequency Ordered Stop   05/17/19 1000  remdesivir 100 mg in sodium chloride 0.9 % 250 mL IVPB     100 mg 500 mL/hr over 30 Minutes Intravenous Every 24 hours 05/16/19 1219 05/21/19 0959   05/16/19 1400  remdesivir 200 mg in sodium chloride 0.9 % 250 mL IVPB     200 mg 500 mL/hr over 30 Minutes Intravenous Once 05/16/19 1219 05/16/19 1721     Subjective: Seen and examined at bedside and patient was doing better today and states that her fever broke last night.  States that she is coughing and has a dry hacking cough and unable to bring up any productive sputum.  No nausea or vomiting.  Denies lightheadedness or dizziness.  No other concerns with at this time as it is a chair bedside and states that she is feeling much better today.  Objective: Vitals:   05/16/19 1222 05/16/19 2026 05/17/19 0447 05/17/19 1327  BP: (!) 154/52 (!) 155/48 (!) 141/62 (!) 164/64  Pulse: 76 76 68 72  Resp: _0 Temp: 99.2 F (37.3 C) 98.9 F (37.2 C) 98.3 F (36.8 C) (!) 97.4 F (36.3 C)  TempSrc: Oral Oral Oral Oral  SpO2: 100% 98% 100% 100%  Weight:      Height:        Intake/Output Summary (Last 24 hours) at 05/17/2019 1355 Last data filed at 05/17/2019 1027 Gross per 24 hour  Intake 1753.23 ml  Output -  Net 1753.23 ml   Filed Weights   05/15/19 2219 05/16/19 0942  Weight: 71.7 kg 72.8 kg   Examination: Physical Exam:  Constitutional: WN/WD overweight Caucasian female currently in NAD and appears calm Eyes: Lids and conjunctivae normal, sclerae anicteric  ENMT: External Ears, Nose appear normal. Grossly normal hearing. Mucous membranes are moist.  Neck: Appears normal, supple, no cervical masses, normal ROM, no appreciable thyromegaly; no JVD Respiratory: Diminished to auscultation bilaterally with slightly coarse  breath sounds, no wheezing, rales, rhonchi or crackles. Normal respiratory effort and patient is not tachypenic. No accessory muscle use.  Unlabored breathing and is wearing 2 L of supplemental oxygen via nasal cannula Cardiovascular: RRR, no murmurs / rubs / gallops. S1 and S2 auscultated.  Trace extremity edema..  Abdomen: Soft, non-tender, Distended 2/2 to body habitus. Bowel sounds positive x4.  GU: Deferred. Musculoskeletal: No clubbing / cyanosis of digits/nails. No joint deformity upper and lower extremities Skin: No rashes, lesions, ulcers on a limited skin evaluation. No induration; Warm and dry.  Neurologic: CN 2-12 grossly intact with no focal deficits. Romberg sign and cerebellar reflexes not assessed.  Psychiatric: Normal judgment and insight. Alert and oriented x 3. Pleasant mood and appropriate affect.   Data Reviewed: I have personally reviewed following labs and imaging studies  CBC: Recent Labs  Lab 05/15/19 2242 05/16/19 0805 05/17/19 0426  WBC 5.1 4.1 3.0*  NEUTROABS 3.5 2.3 2.1  HGB 11.0* 10.7* 9.6*  HCT 34.5* 33.7* 30.6*  MCV 93.8  94.1 94.7  PLT 145* 143* 601*   Basic Metabolic Panel: Recent Labs  Lab 05/15/19 2242 05/16/19 0805 05/17/19 0426  NA 138 139 140  K 3.5 3.7 3.8  CL 98 100 106  CO2 _0 GLUCOSE 154* 90 219*  BUN 37* 38* 41*  CREATININE 1.92* 2.00* 2.04*  CALCIUM 9.4 9.2 9.1  MG  --  2.3 2.2  PHOS  --  4.2 4.4   GFR: Estimated Creatinine Clearance: 23.3 mL/min (A) (by C-G formula based on SCr of 2.04 mg/dL (H)). Liver Function Tests: Recent Labs  Lab 05/15/19 2242 05/16/19 0805 05/17/19 0426  AST 36 42* 33  ALT 34 31 31  ALKPHOS 92 84 76  BILITOT 0.3 0.7 0.3  PROT 7.4 7.3 6.4*  ALBUMIN 3.9 3.9 3.4*   No results for input(s): LIPASE, AMYLASE in the last 168 hours. No results for input(s): AMMONIA in the last 168 hours. Coagulation Profile: No results for input(s): INR, PROTIME in the last 168 hours. Cardiac Enzymes: No  results for input(s): CKTOTAL, CKMB, CKMBINDEX, TROPONINI in the last 168 hours. BNP (last 3 results) No results for input(s): PROBNP in the last 8760 hours. HbA1C: Recent Labs    05/16/19 0805  HGBA1C 8.9*   CBG: Recent Labs  Lab 05/16/19 1221 05/16/19 1719 05/16/19 2023 05/17/19 0748 05/17/19 1311  GLUCAP 235* 360* 320* 189* 265*   Lipid Profile: Recent Labs    05/15/19 2242  TRIG 110   Thyroid Function Tests: No results for input(s): TSH, T4TOTAL, FREET4, T3FREE, THYROIDAB in the last 72 hours. Anemia Panel: Recent Labs    05/15/19 2242 05/17/19 0426 05/17/19 0821  VITAMINB12  --   --  1,274*  FOLATE  --   --  36.0  FERRITIN 70 129  --   TIBC  --   --  268  IRON  --   --  70  RETICCTPCT  --   --  1.5   Sepsis Labs: Recent Labs  Lab 05/15/19 2242 05/16/19 0147 05/16/19 0805 05/17/19 0426  PROCALCITON <0.10  --  <0.10 <0.10  LATICACIDVEN 0.9 0.6  --   --     Recent Results (from the past 240 hour(s))  SARS CORONAVIRUS 2 (TAT 6-24 HRS) Nasopharyngeal Nasopharyngeal Swab     Status: Abnormal   Collection Time: 05/15/19 10:42 PM   Specimen: Nasopharyngeal Swab  Result Value Ref Range Status   SARS Coronavirus 2 POSITIVE (A) NEGATIVE Final    Comment: RESULT CALLED TO, READ BACK BY AND VERIFIED WITH: Marisa Hua RN 9:40 05/16/19 ([email or web address]) (NOTE) SARS-CoV-2 target nucleic acids are DETECTED. The SARS-CoV-2 RNA is generally detectable in upper and lower respiratory specimens during the acute phase of infection. Positive results are indicative of active infection with SARS-CoV-2. Clinical  correlation with patient history and other diagnostic information is necessary to determine patient infection status. Positive results do  not rule out bacterial infection or co-infection with other viruses. The expected result is Negative. Fact Sheet for Patients: SugarRoll.be Fact Sheet for Healthcare Providers:  https://www.woods-mathews.com/ This test is not yet approved or cleared by the [place] FDA and  has been authorized for detection and/or diagnosis of SARS-CoV-2 by FDA under an Emergency Use Authorization (EUA). This EUA will remain  in effect (meaning this test can be used) for  the duration of the COVID-19 declaration under Section 564(b)(1) of the Act, 21 U.S.C. section 360bbb-3(b)(1), unless the authorization is terminated or revoked sooner. Performed at [facility], [place] [address], [place], [place] [address]   Blood Culture (routine x 2)     Status: None (Preliminary result)   Collection Time: 05/15/19 10:42 PM   Specimen: BLOOD  Result Value Ref Range Status   Specimen Description   Final    BLOOD LEFT ANTECUBITAL Performed at [place] [address] [place], [place] [address]    Special Requests   Final    BOTTLES DRAWN AEROBIC AND ANAEROBIC Blood Culture adequate volume Performed at [facility] [address], [place], [place] [address]    Culture   Final    NO GROWTH 1 DAY Performed at [facility], [place] [address], [place], [place] [address]    Report Status PENDING  Incomplete  Blood Culture (routine x 2)     Status: None (Preliminary result)   Collection Time: 05/15/19 10:47 PM   Specimen: BLOOD LEFT FOREARM  Result Value Ref Range Status   Specimen Description   Final    BLOOD LEFT FOREARM Performed at [facility], [place] [address], [place], [place] [address]    Special Requests   Final    BOTTLES DRAWN AEROBIC AND ANAEROBIC Blood Culture adequate volume Performed at [facility] [address], [place], [place] [address]    Culture   Final    NO GROWTH 1 DAY Performed at [facility], [place] [address], [place],  [address]    Report Status PENDING  Incomplete    Radiology Studies: Dg Chest Port 1 View  Result Date: 05/17/2019 CLINICAL DATA:   Shortness of breath. EXAM: PORTABLE CHEST 1 VIEW COMPARISON:  05/15/2019 FINDINGS: 0434 hours. The lungs are clear without focal pneumonia, edema, pneumothorax or pleural effusion. The cardiopericardial silhouette is within normal limits for size. The visualized bony structures of the thorax are intact. Telemetry leads overlie the chest. IMPRESSION: No active disease. Electronically Signed   By: Misty Stanley M.D.   On: 05/17/2019 07:30   Dg Chest Port 1 View  Result Date: 05/15/2019 CLINICAL DATA:  Fever and shortness of breath. EXAM: PORTABLE CHEST 1 VIEW COMPARISON:  12/23/2018 FINDINGS: Post median sternotomy.The cardiomediastinal contours are normal. Atherosclerosis of the aortic arch. Vascular stent in the region of the left upper mediastinum. Coronary stent visualized. Chronic hyperinflation. Pulmonary vasculature is normal. No consolidation, pleural effusion, or pneumothorax. No acute osseous abnormalities are seen. Surgical clips project over the right chest wall. IMPRESSION: 1. No acute abnormality. 2. Chronic hyperinflation. 3.  Aortic Atherosclerosis (ICD10-I70.0). Electronically Signed   By: Keith Rake M.D.   On: 05/15/2019 23:07   Scheduled Meds: . amLODipine  10 mg Oral Daily  . aspirin EC  81 mg Oral Daily  . atorvastatin  40 mg Oral QHS  . dexamethasone  6 mg Oral Daily  . ezetimibe  10 mg Oral Daily  . fluticasone  1 spray Each Nare Daily  . heparin  5,000 Units Subcutaneous Q8H  . insulin aspart  0-15 Units Subcutaneous TID WC  . insulin aspart  0-5 Units Subcutaneous QHS  . insulin glargine  15 Units Subcutaneous QHS  . Ipratropium-Albuterol  1 puff Inhalation Q6H  . metoprolol succinate  25 mg Oral Daily  . mometasone-formoterol  2 puff Inhalation BID  . pantoprazole  40 mg Oral Daily  . sodium chloride flush  3 mL Intravenous Q12H   Continuous Infusions: . remdesivir 100 mg in NS 250 mL 100 mg (05/17/19  1025)    LOS: 1 day   Kerney Elbe, DO [facility] PAGER is on AMION  If 7PM-7AM, please contact night-coverage [email or web address]

## 2019-05-17 NOTE — Progress Notes (Signed)
PROGRESS NOTE    Andrea [name]  [id number]:[id number] DOB: 1946/03/20 DOA: 05/15/2019 PCP: Chester Holstein, MD   Brief Narrative:  The patient is an overweight 73 year old Caucasian female with a past medical history significant for type 2 diabetes mellitus, chronic respiratory failure in setting of COPD on 2 L of supplemental oxygen at home, hypertension, hyperlipidemia, chronic kidney disease stage III with a baseline creatinine of 1.6-1.9 as well as other comorbidities who presented to [facility] long hospital with a fever and a sepsis-like picture.  She had a T-max at home noted to be over 101 with associated chills.  She also had a nonproductive cough as well as rhinorrhea and associated mild shortness of breath which was new for her over the last day or so.  She denies any recent travel or any Covid exposures and underwent CABG in February 2020 and has been on oxygen since then.  In the ED she had a T-max of 100.7 with O2 saturations 98 to 100% on her baseline 2 L of supplemental oxygen.  Chest x-ray showed no evidence of acute cardiopulmonary process and Covid testing was ordered and was positive for SARS-CoV-2.  Patient appeared to have a sepsis-like picture but this is likely secondary to SIRS from her COVID-19 disease.  Inflammatory markers are trending down patient is feeling better today.  We will have the patient ambulate with physical therapy  Assessment & Plan:   Principal Problem:   Sepsis ([place]) Active Problems:   Atherosclerotic heart disease of native coronary artery without angina pectoris   Chronic kidney disease   GERD (gastroesophageal reflux disease)   Hypertension, benign   H/O TIA (transient ischemic attack) and stroke   Fever   Pancytopenia ([place])  Sepsis ruled Out; she has SIRS secondary to COVID-19 disease and COVID-19 infection SIRS criteria met via presenting objective fever as well as tachypnea from COVID-19 disease. -In the setting of presenting objective fever,  chills, new onset cough, new onset shortness of breath, new onset rhinorrhea, there is a clinical index of suspicion for underlying COVID-19.Associated nasopharyngeal swab was obtained this evening, with result currently pending. Criteria not met for the patient's sepsis to be considered severe nature given the absence of any associated evidence of endorgan damage., Including nonelevated presenting lactic acid.  -Of note, present chest x-rays shows no evidence of acute cardiopulmonary process, including no evidence of infiltrate or edema. Presenting urinalysis shows insignificant pyuria in the context of the absence of urinary complaints and does show trace leukocytes, no bacteria, and 11-20 WBCs.  -Patient also denies any associated headache, neck stiffness, rash,nausea, vomiting, diarrhea, or abdominal discomfort. -Rapid influenza evaluate negative. -COVID-19 nasopharyngeal swab obtained this evening came back positive today. -Continue to monitor for results blood cultures and is pending at this time -Continue with supportive care -Trend inflammatory markers; patient initial CRP was 0.8 and now is less than 0.8, lactic acid level 0.6, procalcitonin level was less than 0.10x2, triglycerides were 70, LDH went from 163 and now is 197 and has now gone down to 145; ESR was 46 and is now 45, and D-dimer went from 2.33 and is now down to 1.50, fibrinogen level was 596 and trended down to 530 today -Started the patient on dexamethasone 6 mg IV every 24 hours and will continue for at least 10 days -Continue fluticasone 1 spray in each nare daily, as needed albuterol inhaler 2 puffs every 6 hours as needed for wheezing or shortness breath, scheduled Combivent 1 puff every 6,  as well as Dulera 2 puffs twice daily -We will hold her Incruse Ellipta -We will also give Remdesivir 5-day treatment and she has 3 more doses of treatment after today -Trend inflammatory markers daily and repeat chest x-ray in a.m.   Type 2 Diabetes Mellitus -Does not require exogenous insulin as an outpatient, rather, the patient was on glipizide as well as Januvia at home.  -Presenting blood sugar per presenting CMP noted to be 154.  This morning blood sugar on CMP was 219 -Hold home Januvia and glipizide during this hospitalization.  -Ordered Accu-Cheks q. before meals and at bedtime with associated sliding scale insulin. -Continue to Monitor CBG's carefully; CBGs were elevated and ranged from 79-360 -Checked a hemoglobin A1c and was 8.9 -Started the patient on insulin glargine 15 units subcu nightly -Continue to monitor blood sugars carefully and adjust insulin regimen as necessary  Chronic Hypoxic Respiratory failure in the setting of COPD and now COVID 19 Disease -Patient is on 2 L continuous supplemental oxygen at baseline, without any increase in baseline oxygen requirements presentation thus far.  -Outpatient respiratory regimen include Symbicort, Spiriva, and as needed albuterol inhaler and have substituted as above  -Continue home respiratory regimen, with as needed titration of supplemental oxygen order to maintain oxygen saturations greater than or equal to 92%.  -Patient is doing well from a respiratory standpoint but complains of having a dry hacking cough  Essential Hypertension -On Norvasc as an outpatient.  -No evidence of hypotension thus far, however, in the setting of septic presentation, will her antihypertensives were held but will resume her home amlodipine today -Continue to monitor blood pressures per protocol  Stage III Chronic Kidney Disease -Associated baseline creatinine of 1.6-1.9. -Presenting labs reflect serum creatinine with in this baseline range. -Started Gentle IVF Hydration with NS at 75 mL/hr but is now stopped; patient BUN/creatinine went from 38/2.00 and is now 41/2.04 -Monitor strict I's and O's and daily weights.  -Attempt to avoid nephrotoxic agents and Renally  Dose Medications.  -Repeat CMP in the morning.  GERD -On Omeprazole as an outpatient. -Continue home PPI with Formulary Substitution of Pantoprazole   Normocytic Anemia -Likely anemia of chronic kidney disease -The patient's hemoglobin/hematocrit went from 11.0/34.5 and is now 9.6/30.6 -Likely also partly because of a dilutional drop from IV fluid hydration -Checked anemia panel this am and showed an iron level of 70, U IBC 198, TIBC of 268, saturation ratios of 26%, ferritin level of 129, folate level 36.0, and vitamin B12 levels 1274 -Continue to monitor for signs and symptoms of bleeding; currently no overt bleeding noted -Repeat CBC in a.m.  Thrombocytopenia  -Patient platelet count on admission was 145,000 and repeat this morning was 122,000 -Continue to monitor for signs and symptoms of bleeding: Currently no overt bleeding noted -Repeat CBC in a.m.  Pancytopenia -? Dilutional Drop vs. COVID -Will save Smear in AM -Patient's WBC went from 5.1 -> 3.0, Hb/Hct is now 9.6/30.6, and Platelet Count is now 122,000  -If continues to [place] may discuss with Hematology/Oncology  -Continue to Monitor and Trend; Repeat CBC in AM   Elevated AST, improved  -Likely in the setting of COVID-19 disease; AST was 42 on admission and is now 33 -Continue to monitor and trend LFTs -If necessary will obtain a right upper quadrant ultrasound -Repeat CBC in a.m.  CAD with recent CABG  -Continue with Aspirin 81 mg p.o. daily along with atorvastatin 40 mg p.o. nightly -Also will continue with metoprolol succinate 25  mg p.o. daily  Hyperlipidemia -Continue atorvastatin 40 minutes p.o. nightly and continue with ezetimibe 10 mg Daily.  DVT prophylaxis: Heparin 5,000 units sq q8h Code Status: FULL CODE  Family Communication: No family present at bedside  Disposition Plan: Pending further clinical improvement, evaluation by PT and completion of Remdesivir   Consultants:   None    Procedures:  None  Antimicrobials: Anti-infectives (From admission, onward)   Start     Dose/Rate Route Frequency Ordered Stop   05/17/19 1000  remdesivir 100 mg in sodium chloride 0.9 % 250 mL IVPB     100 mg 500 mL/hr over 30 Minutes Intravenous Every 24 hours 05/16/19 1219 05/21/19 0959   05/16/19 1400  remdesivir 200 mg in sodium chloride 0.9 % 250 mL IVPB     200 mg 500 mL/hr over 30 Minutes Intravenous Once 05/16/19 1219 05/16/19 1721     Subjective: Seen and examined at bedside and patient was doing better today and states that her fever broke last night.  States that she is coughing and has a dry hacking cough and unable to bring up any productive sputum.  No nausea or vomiting.  Denies lightheadedness or dizziness.  No other concerns with at this time as it is a chair bedside and states that she is feeling much better today.  Objective: Vitals:   05/16/19 1222 05/16/19 2026 05/17/19 0447 05/17/19 1327  BP: (!) 154/52 (!) 155/48 (!) 141/62 (!) 164/64  Pulse: 76 76 68 72  Resp: 16 18 18 16   Temp: 99.2 F (37.3 C) 98.9 F (37.2 C) 98.3 F (36.8 C) (!) 97.4 F (36.3 C)  TempSrc: Oral Oral Oral Oral  SpO2: 100% 98% 100% 100%  Weight:      Height:        Intake/Output Summary (Last 24 hours) at 05/17/2019 1350 Last data filed at 05/17/2019 3382 Gross per 24 hour  Intake 1753.23 ml  Output -  Net 1753.23 ml   Filed Weights   05/15/19 2219 05/16/19 0942  Weight: 71.7 kg 72.8 kg   Examination: Physical Exam:  Constitutional: WN/WD overweight Caucasian female currently in NAD and appears calm Eyes: Lids and conjunctivae normal, sclerae anicteric  ENMT: External Ears, Nose appear normal. Grossly normal hearing. Mucous membranes are moist.  Neck: Appears normal, supple, no cervical masses, normal ROM, no appreciable thyromegaly; no JVD Respiratory: Diminished to auscultation bilaterally with slightly coarse breath sounds, no wheezing, rales, rhonchi or crackles.  Normal respiratory effort and patient is not tachypenic. No accessory muscle use.  Unlabored breathing and is wearing 2 L of supplemental oxygen via nasal cannula Cardiovascular: RRR, no murmurs / rubs / gallops. S1 and S2 auscultated.  Trace extremity edema..  Abdomen: Soft, non-tender, Distended 2/2 to body habitus. Bowel sounds positive x4.  GU: Deferred. Musculoskeletal: No clubbing / cyanosis of digits/nails. No joint deformity upper and lower extremities Skin: No rashes, lesions, ulcers on a limited skin evaluation. No induration; Warm and dry.  Neurologic: CN 2-12 grossly intact with no focal deficits. Romberg sign and cerebellar reflexes not assessed.  Psychiatric: Normal judgment and insight. Alert and oriented x 3. Pleasant mood and appropriate affect.   Data Reviewed: I have personally reviewed following labs and imaging studies  CBC: Recent Labs  Lab 05/15/19 2242 05/16/19 0805 05/17/19 0426  WBC 5.1 4.1 3.0*  NEUTROABS 3.5 2.3 2.1  HGB 11.0* 10.7* 9.6*  HCT 34.5* 33.7* 30.6*  MCV 93.8 94.1 94.7  PLT 145* 143* 122*  Basic Metabolic Panel: Recent Labs  Lab 05/15/19 2242 05/16/19 0805 05/17/19 0426  NA 138 139 140  K 3.5 3.7 3.8  CL 98 100 106  CO2 28 28 23   GLUCOSE 154* 90 219*  BUN 37* 38* 41*  CREATININE 1.92* 2.00* 2.04*  CALCIUM 9.4 9.2 9.1  MG  --  2.3 2.2  PHOS  --  4.2 4.4   GFR: Estimated Creatinine Clearance: 23.3 mL/min (A) (by C-G formula based on SCr of 2.04 mg/dL (H)). Liver Function Tests: Recent Labs  Lab 05/15/19 2242 05/16/19 0805 05/17/19 0426  AST 36 42* 33  ALT 34 31 31  ALKPHOS 92 84 76  BILITOT 0.3 0.7 0.3  PROT 7.4 7.3 6.4*  ALBUMIN 3.9 3.9 3.4*   No results for input(s): LIPASE, AMYLASE in the last 168 hours. No results for input(s): AMMONIA in the last 168 hours. Coagulation Profile: No results for input(s): INR, PROTIME in the last 168 hours. Cardiac Enzymes: No results for input(s): CKTOTAL, CKMB, CKMBINDEX, TROPONINI  in the last 168 hours. BNP (last 3 results) No results for input(s): PROBNP in the last 8760 hours. HbA1C: Recent Labs    05/16/19 0805  HGBA1C 8.9*   CBG: Recent Labs  Lab 05/16/19 1221 05/16/19 1719 05/16/19 2023 05/17/19 0748 05/17/19 1311  GLUCAP 235* 360* 320* 189* 265*   Lipid Profile: Recent Labs    05/15/19 2242  TRIG 110   Thyroid Function Tests: No results for input(s): TSH, T4TOTAL, FREET4, T3FREE, THYROIDAB in the last 72 hours. Anemia Panel: Recent Labs    05/15/19 2242 05/17/19 0426 05/17/19 0821  VITAMINB12  --   --  1,274*  FOLATE  --   --  36.0  FERRITIN 70 129  --   TIBC  --   --  268  IRON  --   --  70  RETICCTPCT  --   --  1.5   Sepsis Labs: Recent Labs  Lab 05/15/19 2242 05/16/19 0147 05/16/19 0805 05/17/19 0426  PROCALCITON <0.10  --  <0.10 <0.10  LATICACIDVEN 0.9 0.6  --   --     Recent Results (from the past 240 hour(s))  SARS CORONAVIRUS 2 (TAT 6-24 HRS) Nasopharyngeal Nasopharyngeal Swab     Status: Abnormal   Collection Time: 05/15/19 10:42 PM   Specimen: Nasopharyngeal Swab  Result Value Ref Range Status   SARS Coronavirus 2 POSITIVE (A) NEGATIVE Final    Comment: RESULT CALLED TO, READ BACK BY AND VERIFIED WITH: Marisa Hua RN 9:40 05/16/19 ([email or web address]) (NOTE) SARS-CoV-2 target nucleic acids are DETECTED. The SARS-CoV-2 RNA is generally detectable in upper and lower respiratory specimens during the acute phase of infection. Positive results are indicative of active infection with SARS-CoV-2. Clinical  correlation with patient history and other diagnostic information is necessary to determine patient infection status. Positive results do  not rule out bacterial infection or co-infection with other viruses. The expected result is Negative. Fact Sheet for Patients: SugarRoll.be Fact Sheet for Healthcare Providers: https://www.woods-mathews.com/ This test is not yet approved or cleared  by the [place] FDA and  has been authorized for detection and/or diagnosis of SARS-CoV-2 by FDA under an Emergency Use Authorization (EUA). This EUA will remain  in effect (meaning this test can be used) for  the duration of the COVID-19 declaration under Section 564(b)(1) of the Act, 21 U.S.C. section 360bbb-3(b)(1), unless the authorization is terminated or revoked sooner. Performed at [facility], [place] [address] [place], [place] [address]  Blood Culture (routine x 2)     Status: None (Preliminary result)   Collection Time: 05/15/19 10:42 PM   Specimen: BLOOD  Result Value Ref Range Status   Specimen Description   Final    BLOOD LEFT ANTECUBITAL Performed at [place] [address] [place], [place] [address]    Special Requests   Final    BOTTLES DRAWN AEROBIC AND ANAEROBIC Blood Culture adequate volume Performed at [place] [address], [place], [place] [address]    Culture   Final    NO GROWTH 1 DAY Performed at [facility], [place] [address], [place], [place] [address]    Report Status PENDING  Incomplete  Blood Culture (routine x 2)     Status: None (Preliminary result)   Collection Time: 05/15/19 10:47 PM   Specimen: BLOOD LEFT FOREARM  Result Value Ref Range Status   Specimen Description   Final    BLOOD LEFT FOREARM Performed at [facility], [place] [address], [place], [place] [address]    Special Requests   Final    BOTTLES DRAWN AEROBIC AND ANAEROBIC Blood Culture adequate volume Performed at [place] [address] [place], [place] [address]    Culture   Final    NO GROWTH 1 DAY Performed at [facility], [place] [address], [place], [place] [address]    Report Status PENDING  Incomplete    Radiology Studies: Dg Chest Port 1 View  Result Date: 05/17/2019 CLINICAL DATA:  Shortness of breath. EXAM: PORTABLE CHEST 1 VIEW COMPARISON:  05/15/2019 FINDINGS:  0434 hours. The lungs are clear without focal pneumonia, edema, pneumothorax or pleural effusion. The cardiopericardial silhouette is within normal limits for size. The visualized bony structures of the thorax are intact. Telemetry leads overlie the chest. IMPRESSION: No active disease. Electronically Signed   By: Misty Stanley M.D.   On: 05/17/2019 07:30   Dg Chest Port 1 View  Result Date: 05/15/2019 CLINICAL DATA:  Fever and shortness of breath. EXAM: PORTABLE CHEST 1 VIEW COMPARISON:  12/23/2018 FINDINGS: Post median sternotomy.The cardiomediastinal contours are normal. Atherosclerosis of the aortic arch. Vascular stent in the region of the left upper mediastinum. Coronary stent visualized. Chronic hyperinflation. Pulmonary vasculature is normal. No consolidation, pleural effusion, or pneumothorax. No acute osseous abnormalities are seen. Surgical clips project over the right chest wall. IMPRESSION: 1. No acute abnormality. 2. Chronic hyperinflation. 3.  Aortic Atherosclerosis (ICD10-I70.0). Electronically Signed   By: Keith Rake M.D.   On: 05/15/2019 23:07   Scheduled Meds: . amLODipine  10 mg Oral Daily  . aspirin EC  81 mg Oral Daily  . atorvastatin  40 mg Oral QHS  . dexamethasone  6 mg Oral Daily  . ezetimibe  10 mg Oral Daily  . fluticasone  1 spray Each Nare Daily  . heparin  5,000 Units Subcutaneous Q8H  . insulin aspart  0-15 Units Subcutaneous TID WC  . insulin aspart  0-5 Units Subcutaneous QHS  . insulin glargine  15 Units Subcutaneous QHS  . Ipratropium-Albuterol  1 puff Inhalation Q6H  . metoprolol succinate  25 mg Oral Daily  . mometasone-formoterol  2 puff Inhalation BID  . pantoprazole  40 mg Oral Daily  . sodium chloride flush  3 mL Intravenous Q12H   Continuous Infusions: . remdesivir 100 mg in NS 250 mL 100 mg (05/17/19 1025)    LOS: 1 day   Bertram Savin  Alfredia Ferguson, DO [facility] PAGER is on AMION  If 7PM-7AM, please contact night-coverage  [email or web address]

## 2019-05-18 ENCOUNTER — Inpatient Hospital Stay (HOSPITAL_COMMUNITY): Payer: Medicare Other

## 2019-05-18 DIAGNOSIS — K219 Gastro-esophageal reflux disease without esophagitis: Secondary | ICD-10-CM

## 2019-05-18 LAB — CBC WITH DIFFERENTIAL/PLATELET
Abs Immature Granulocytes: 0.01 10*3/uL (ref 0.00–0.07)
Basophils Absolute: 0 10*3/uL (ref 0.0–0.1)
Basophils Relative: 0 %
Eosinophils Absolute: 0 10*3/uL (ref 0.0–0.5)
Eosinophils Relative: 0 %
HCT: 30.2 % — ABNORMAL LOW (ref 36.0–46.0)
Hemoglobin: 9.4 g/dL — ABNORMAL LOW (ref 12.0–15.0)
Immature Granulocytes: 0 %
Lymphocytes Relative: 22 %
Lymphs Abs: 0.6 10*3/uL — ABNORMAL LOW (ref 0.7–4.0)
MCH: 29.7 pg (ref 26.0–34.0)
MCHC: 31.1 g/dL (ref 30.0–36.0)
MCV: 95.6 fL (ref 80.0–100.0)
Monocytes Absolute: 0.2 10*3/uL (ref 0.1–1.0)
Monocytes Relative: 7 %
Neutro Abs: 2 10*3/uL (ref 1.7–7.7)
Neutrophils Relative %: 71 %
Platelets: 102 10*3/uL — ABNORMAL LOW (ref 150–400)
RBC: 3.16 MIL/uL — ABNORMAL LOW (ref 3.87–5.11)
RDW: 13.1 % (ref 11.5–15.5)
WBC: 2.8 10*3/uL — ABNORMAL LOW (ref 4.0–10.5)
nRBC: 0 % (ref 0.0–0.2)

## 2019-05-18 LAB — COMPREHENSIVE METABOLIC PANEL
ALT: 29 U/L (ref 0–44)
AST: 27 U/L (ref 15–41)
Albumin: 3.3 g/dL — ABNORMAL LOW (ref 3.5–5.0)
Alkaline Phosphatase: 67 U/L (ref 38–126)
Anion gap: 11 (ref 5–15)
BUN: 49 mg/dL — ABNORMAL HIGH (ref 8–23)
CO2: 22 mmol/L (ref 22–32)
Calcium: 9.1 mg/dL (ref 8.9–10.3)
Chloride: 105 mmol/L (ref 98–111)
Creatinine, Ser: 2.18 mg/dL — ABNORMAL HIGH (ref 0.44–1.00)
GFR calc Af Amer: 25 mL/min — ABNORMAL LOW (ref >60)
GFR calc non Af Amer: 22 mL/min — ABNORMAL LOW (ref >60)
Glucose, Bld: 300 mg/dL — ABNORMAL HIGH (ref 70–99)
Potassium: 4.5 mmol/L (ref 3.5–5.1)
Sodium: 138 mmol/L (ref 135–145)
Total Bilirubin: 0.2 mg/dL — ABNORMAL LOW (ref 0.3–1.2)
Total Protein: 6.3 g/dL — ABNORMAL LOW (ref 6.5–8.1)

## 2019-05-18 LAB — GLUCOSE, CAPILLARY
Glucose-Capillary: 213 mg/dL — ABNORMAL HIGH (ref 70–99)
Glucose-Capillary: 267 mg/dL — ABNORMAL HIGH (ref 70–99)
Glucose-Capillary: 250 mg/dL — ABNORMAL HIGH (ref 70–99)
Glucose-Capillary: 345 mg/dL — ABNORMAL HIGH (ref 70–99)

## 2019-05-18 LAB — LACTATE DEHYDROGENASE: LDH: 151 U/L (ref 98–192)

## 2019-05-18 LAB — C-REACTIVE PROTEIN: CRP: <0.8 mg/dL (ref <1.0)

## 2019-05-18 LAB — SAVE SMEAR(SSMR), FOR PROVIDER SLIDE REVIEW

## 2019-05-18 LAB — FERRITIN: Ferritin: 147 ng/mL (ref 11–307)

## 2019-05-18 LAB — FIBRINOGEN: Fibrinogen: 432 mg/dL (ref 210–475)

## 2019-05-18 LAB — D-DIMER, QUANTITATIVE: D-Dimer, Quant: 0.99 ug/mL-FEU — ABNORMAL HIGH (ref 0.00–0.50)

## 2019-05-18 LAB — PROCALCITONIN: Procalcitonin: <0.1 ng/mL

## 2019-05-18 LAB — SEDIMENTATION RATE: Sed Rate: 35 mm/hr — ABNORMAL HIGH (ref 0–22)

## 2019-05-18 LAB — PHOSPHORUS: Phosphorus: 4.6 mg/dL (ref 2.5–4.6)

## 2019-05-18 LAB — MAGNESIUM: Magnesium: 2.3 mg/dL (ref 1.7–2.4)

## 2019-05-18 MED ORDER — INSULIN ASPART 100 UNIT/ML ~~LOC~~ SOLN
3.0000 [IU] | Freq: Three times a day (TID) | SUBCUTANEOUS | Status: DC
  Administered 2019-05-18 – 2019-05-19 (×5): 3 [IU] via SUBCUTANEOUS

## 2019-05-18 MED ORDER — INSULIN ASPART 100 UNIT/ML ~~LOC~~ SOLN: 0.0000 [IU] | Freq: Every day | SUBCUTANEOUS | Status: DC

## 2019-05-18 MED ORDER — INSULIN ASPART 100 UNIT/ML ~~LOC~~ SOLN
0.0000 [IU] | Freq: Three times a day (TID) | SUBCUTANEOUS | Status: DC
  Administered 2019-05-18: 5 [IU] via SUBCUTANEOUS

## 2019-05-18 MED ORDER — INSULIN ASPART 100 UNIT/ML ~~LOC~~ SOLN
0.0000 [IU] | Freq: Three times a day (TID) | SUBCUTANEOUS | Status: DC
  Administered 2019-05-18: 11 [IU] via SUBCUTANEOUS
  Administered 2019-05-18: 7 [IU] via SUBCUTANEOUS

## 2019-05-18 MED ORDER — INSULIN ASPART 100 UNIT/ML ~~LOC~~ SOLN
0.0000 [IU] | Freq: Every day | SUBCUTANEOUS | Status: DC
  Administered 2019-05-18: 22:00:00 4 [IU] via SUBCUTANEOUS

## 2019-05-18 MED ORDER — INSULIN GLARGINE 100 UNIT/ML ~~LOC~~ SOLN
20.0000 [IU] | Freq: Every day | SUBCUTANEOUS | Status: DC
  Administered 2019-05-18: 20 [IU] via SUBCUTANEOUS
  Filled 2019-05-18 (×2): qty 0.2

## 2019-05-18 NOTE — Evaluation (Signed)
Occupational Therapy Evaluation Patient Details Name: Andrea [name] MRN: [id number] DOB: 09-06-1945 Today's Date: 05/18/2019    History of Present Illness 73 year old female admitted with fever/sepsis; found to be positive for COVID.  PMH:  DM, COPD, on 2 liters at baseline, HTN, CKD   Clinical Impression   Pt was admitted for the above.  She is independent and safe with adls and toileting. No further OT is needed at this time.    Follow Up Recommendations  No OT follow up    Equipment Recommendations  None recommended by OT    Recommendations for Other Services       Precautions / Restrictions Precautions Precautions: None Restrictions Weight Bearing Restrictions: No      Mobility Bed Mobility Overal bed mobility: Independent                Transfers Overall transfer level: Independent               General transfer comment: good safety awareness    Balance                                           ADL either performed or assessed with clinical judgement   ADL Overall ADL's : Independent                                       General ADL Comments: pt makes modifications as needed; wipes from front back; cannot reach to very back of hair.  She reports arms have been limited since she had open heart sx     Vision         Perception     Praxis      Pertinent Vitals/Pain Pain Assessment: No/denies pain     Hand Dominance     Extremity/Trunk Assessment Upper Extremity Assessment Upper Extremity Assessment: Overall WFL for tasks assessed(decr bil shoulder ROM in all directions )           Communication Communication Communication: No difficulties   Cognition Arousal/Alertness: Awake/alert Behavior During Therapy: WFL for tasks assessed/performed Overall Cognitive Status: Within Functional Limits for tasks assessed                                     General Comments  removed 02  for a few minutes:  sats 92% but WOB increased    Exercises     Shoulder Instructions      Home Living Family/patient expects to be discharged to:: Private residence Living Arrangements: Alone   Type of Home: Apartment Home Access: Elevator     Home Layout: One level     Bathroom Shower/Tub: Teacher, early years/pre: Handicapped height     Home Equipment: Bedside commode;Toilet riser;Shower seat;Walker - 4 wheels(02)   Additional Comments: pt reports she was recently in rehab in [place]      Prior Functioning/Environment Level of Independence: Independent        Comments: (drives; sister gets groceries)        OT Problem List:        OT Treatment/Interventions:      OT Goals(Current goals can be found in the care plan section) Acute Rehab OT  Goals Patient Stated Goal: home  OT Goal Formulation: All assessment and education complete, DC therapy  OT Frequency:     Barriers to D/C:            Co-evaluation PT/OT/SLP Co-Evaluation/Treatment: Yes Reason for Co-Treatment: For patient/therapist safety PT goals addressed during session: Mobility/safety with mobility OT goals addressed during session: ADL's and self-care      AM-PAC OT "6 Clicks" Daily Activity     Outcome Measure Help from another person eating meals?: None Help from another person taking care of personal grooming?: None Help from another person toileting, which includes using toliet, bedpan, or urinal?: None Help from another person bathing (including washing, rinsing, drying)?: None Help from another person to put on and taking off regular upper body clothing?: None Help from another person to put on and taking off regular lower body clothing?: None 6 Click Score: 24   End of Session    Activity Tolerance: Patient tolerated treatment well Patient left: in bed;with call bell/phone within reach  OT Visit Diagnosis: Muscle weakness (generalized) (M62.81)                 Time: [id number] OT Time Calculation (min): 27 min Charges:  OT General Charges $OT Visit: 1 Visit OT Evaluation $OT Eval Low Complexity: 1 Low  Lesle Chris, OTR/L Acute Rehabilitation Services [phone] [facility] pager [phone] office 05/18/2019  [place] 05/18/2019, 3:59 PM

## 2019-05-18 NOTE — Progress Notes (Signed)
Inpatient Diabetes Program Recommendations  AACE/ADA: New Consensus Statement on Inpatient Glycemic Control (2015)  Target Ranges:  Prepandial:   less than 140 mg/dL      Peak postprandial:   less than 180 mg/dL (1-2 hours)      Critically ill patients:  140 - 180 mg/dL   Lab Results  Component Value Date   GLUCAP 321 (H) 05/17/2019   HGBA1C 8.9 (H) 05/16/2019    Review of Glycemic Control Results for Andrea [name], Andrea [name] (MRN [id number]) as of 05/18/2019 08:34  Ref. Range 05/17/2019 07:48 05/17/2019 13:11 05/17/2019 17:40 05/17/2019 21:33 05/18/2019 08:28  Glucose-Capillary Latest Ref Range: 70 - 99 mg/dL 189 (H) 265 (H) 252 (H) 321 (H) 213 (H)   Consult for glycemic control in the setting of steroids for COVID  Diabetes history: DM 2 Outpatient /Diabetes medications: Glipizide 10 mg bid, anuvia 50 mg Daily Current orders for Inpatient glycemic control:  Lantus 15 units Novolog 0-20 units tid + hs Novolog 3 units tid meal coverage  Decadron 6 mg Daily BUN/Creat: 49/2.18  Inpatient Diabetes Program Recommendations:    Consider increasing Lantus to 20 units tonight.  Will watch on current Novolog Correction scale and meal coverage. May need to increase meal coverage if glucose trends increase after meals due to decadron dose at 10 am.   Thanks,  Tama Headings RN, MSN, BC-ADM Inpatient Diabetes Coordinator Team Pager [phone] (8a-5p)

## 2019-05-18 NOTE — Progress Notes (Signed)
PROGRESS NOTE    Andrea [name]  YKZ:[id number] DOB: 1946-04-14 DOA: 05/15/2019 PCP: Chester Holstein, MD   Brief Narrative:  The patient is an overweight 73 year old Caucasian female with a past medical history significant for type 2 diabetes mellitus, chronic respiratory failure in setting of COPD on 2 L of supplemental oxygen at home, hypertension, hyperlipidemia, chronic kidney disease stage III with a baseline creatinine of 1.6-1.9 as well as other comorbidities who presented to [facility] long hospital with a fever and a sepsis-like picture.  She had a T-max at home noted to be over 101 with associated chills.  She also had a nonproductive cough as well as rhinorrhea and associated mild shortness of breath which was new for her over the last day or so.  She denies any recent travel or any Covid exposures and underwent CABG in February 2020 and has been on oxygen since then.    In the ED she had a T-max of 100.7 with O2 saturations 98 to 100% on her baseline 2 L of supplemental oxygen.  Chest x-ray showed no evidence of acute cardiopulmonary process and Covid testing was ordered and was positive for SARS-CoV-2.  Patient appeared to have a sepsis-like picture but this is likely secondary to SIRS from her COVID-19 disease as it was mild.  Inflammatory markers are trending down patient is feeling better today.  We will have the patient ambulate with physical therapy  Assessment & Plan:   Principal Problem:   Sepsis ([place]) Active Problems:   Atherosclerotic heart disease of native coronary artery without angina pectoris   Chronic kidney disease   GERD (gastroesophageal reflux disease)   Hypertension, benign   H/O TIA (transient ischemic attack) and stroke   Fever   Pancytopenia ([place])  Sepsis ruled Out; she has SIRS secondary to COVID-19 disease and COVID-19 infection -SIRS criteria met via presenting objective fever as well as tachypnea from COVID-19 disease. -In the setting of presenting  objective fever, chills, new onset cough, new onset shortness of breath, new onset rhinorrhea, there is a clinical index of suspicion for underlying COVID-19.Associated nasopharyngeal swab was obtained this evening, with result currently pending. Criteria not met for the patient's sepsis to be considered severe nature given the absence of any associated evidence of endorgan damage., Including nonelevated presenting lactic acid.  -Of note, Admission chest x-rays shows no evidence of acute cardiopulmonary process, including no evidence of infiltrate or edema. Presenting urinalysis shows insignificant pyuria in the context of the absence of urinary complaints and does show trace leukocytes, no bacteria, and 11-20 WBCs.  -Repeat CXR this AM showed "No evidence of active disease." -Patient also denies any associated headache, neck stiffness, rash,nausea, vomiting, diarrhea, or abdominal discomfort. -Rapid influenza evaluate negative. -COVID-19 nasopharyngeal swab was POSITIVE .  -Continue to monitor for results blood cultures and is pending at this time -Continue with supportive care -Trend inflammatory markers; procalcitonin level was less than 0.10x2, triglycerides were 70, ESR was 46 -> 45 -> 35, and fibrinogen level was 596 -> 530 -> 432 COVID-19 Labs  Recent Labs    05/15/19 2242 05/16/19 0805 05/16/19 1044 05/17/19 0426 05/18/19 0243  DDIMER 2.33*  --  2.06* 1.50* 0.99*  FERRITIN 70  --   --  129 147  LDH 163 197*  --  145 151  CRP 0.8  --   --  <0.8 <0.8   Lab Results  Component Value Date   SARSCOV2NAA POSITIVE (A) 05/15/2019   -Started the patient on  dexamethasone 6 mg IV every 24 hours and will continue for at least 10 days and changed to po  -Continue fluticasone 1 spray in each nare daily, as needed albuterol inhaler 2 puffs every 6 hours as needed for wheezing or shortness breath, scheduled Combivent 1 puff every 6, as well as Dulera 2 puffs twice daily -We will hold her  Incruse Ellipta -We will also give Remdesivir 5-day treatment and she has 2 more doses of treatment after today -Continue Trend inflammatory markers daily and repeat chest x-ray in a.m.  Type 2 Diabetes Mellitus with Hyperglycemia -Does not require exogenous insulin as an outpatient, rather, the patient was on glipizide as well as Januvia at home.  -Presenting blood sugar per presenting CMP noted to be 154.  This morning blood sugar on CMP was 300 -Hold home Januvia and glipizide during this hospitalization.  -Ordered Accu-Cheks q. before meals and at bedtime with associated sliding scale insulin. -Continue to Monitor CBG's carefully; CBGs were elevated and ranged from 79-360 -Checked a hemoglobin A1c and was 8.9 -Started the patient on insulin glargine 15 units subcu nightly and increased to 2o units -Will increase SSI to Moderate AC/HS and start 3 units TIDwm -Continue to monitor blood sugars carefully and adjust insulin regimen as necessary  Chronic Hypoxic Respiratory failure in the setting of COPD and now COVID 19 Disease -Patient is on 2 L continuous supplemental oxygen at baseline, without any increase in baseline oxygen requirements presentation thus far.  -Outpatient respiratory regimen include Symbicort, Spiriva, and as needed albuterol inhaler and have substituted as above  -Continue home respiratory regimen, with as needed titration of supplemental oxygen order to maintain oxygen saturations greater than or equal to 92%.  -Patient is doing well from a respiratory standpoint but complains of having a dry hacking cough but feel it is improving   Essential Hypertension -On Norvasc as an outpatient.  -No evidence of hypotension thus far, however, in the setting of septic presentation, her antihypertensives were held but resumed Amlodipine yesterday -BP was 163/61 -Continue to monitor blood pressures per protocol  Stage III Chronic Kidney Disease -Associated baseline  creatinine of 1.6-1.9. -Presenting labs reflect serum creatinine with in this baseline range. -Gentle IVF Hydration with NS at 75 mL/hr is now stopped; patient BUN/creatinine went from 38/2.00 -> 41/2.04 -> 49/2.18 -Monitor strict I's and O's and daily weights.  -Attempt to avoid nephrotoxic agents and Renally Dose Medications.  -Repeat CMP in the morning.  GERD -On Omeprazole as an outpatient. -Continue home PPI with Formulary Substitution of Pantoprazole   Normocytic Anemia -Likely anemia of chronic kidney disease -The patient's hemoglobin/hematocrit went from 11.0/34.5 and is now 9.4/30.2 -Likely also partly because of a dilutional drop from IV fluid hydration -Checked anemia panel this am and showed an iron level of 70, U IBC 198, TIBC of 268, saturation ratios of 26%, ferritin level of 129, folate level 36.0, and vitamin B12 levels 1274 -Continue to monitor for signs and symptoms of bleeding; currently no overt bleeding noted -Repeat CBC in a.m.  Thrombocytopenia, worsening   -Patient platelet count on admission was 145,000 -> 122,000 -> 102,000 -Continue to monitor for signs and symptoms of bleeding: Currently no overt bleeding noted but has some arm bruising on the Right Arm -Repeat CBC in a.m.  Pancytopenia -? Dilutional Drop vs. COVID vs. Medication induced -Will save Smear in AM -Patient's WBC went from 5.1 -> 3.0 -> 2.8, Hb/Hct is now 9.4/30.2, and Platelet Count is now 102,000; patient  does have lymphopenia consistent with Covid -Since continued to worsen will have Hematology evaluate further and Dr. Burr Medico to review the chart and Dr. Burr Medico believes this is related to her COVID-19 disease and not secondary to Remdesivir -Continue to Monitor and Trend; Repeat CBC in AM   Elevated AST, improved  -Likely in the setting of COVID-19 disease; AST was 42 on admission and is now 27 -Continue to monitor and trend LFTs -If necessary will obtain a right upper quadrant  ultrasound -Repeat CBC in a.m.  CAD with recent CABG  -Continue with Aspirin 81 mg p.o. daily along with atorvastatin 40 mg p.o. nightly -Also will continue with metoprolol succinate 25 mg p.o. daily  Hyperlipidemia -Continue atorvastatin 40 minutes p.o. nightly and continue with ezetimibe 10 mg Daily.  DVT prophylaxis: Heparin 5,000 units sq q8h Code Status: FULL CODE  Family Communication: No family present at bedside  Disposition Plan: Pending further clinical improvement, evaluation by PT and completion of Remdesivir   Consultants:   Discussed case with Oncology    Procedures:  None  Antimicrobials: Anti-infectives (From admission, onward)   Start     Dose/Rate Route Frequency Ordered Stop   05/17/19 1000  remdesivir 100 mg in sodium chloride 0.9 % 250 mL IVPB     100 mg 500 mL/hr over 30 Minutes Intravenous Every 24 hours 05/16/19 1219 05/21/19 0959   05/16/19 1400  remdesivir 200 mg in sodium chloride 0.9 % 250 mL IVPB     200 mg 500 mL/hr over 30 Minutes Intravenous Once 05/16/19 1219 05/16/19 1721     Subjective: Seen and examined at bedside and she is feeling well.  Denies any nausea or vomiting.  I discussed with her about her pancytopenia and she got a little concerned.  No nausea or vomiting.  No other concerns point this time.  Objective: Vitals:   05/17/19 2034 05/17/19 2135 05/18/19 0453 05/18/19 1107  BP:  (!) 147/62 (!) 168/62 (!) 163/61  Pulse:  69 63 69  Resp:  _0 Temp:  98.6 F (37 C) 97.9 F (36.6 C) 98.4 F (36.9 C)  TempSrc:  Oral Oral Oral  SpO2: 100% 100% 100% 100%  Weight:      Height:        Intake/Output Summary (Last 24 hours) at 05/18/2019 1423 Last data filed at 05/18/2019 1000 Gross per 24 hour  Intake 500 ml  Output -  Net 500 ml   Filed Weights   05/15/19 2219 05/16/19 0942  Weight: 71.7 kg 72.8 kg   Examination: Physical Exam:  Constitutional: WN/WD overweight Caucasian female currently in no acute distress  appears calm Eyes: Lids and conjunctivae normal, sclerae anicteric  ENMT: External Ears, Nose appear normal. Grossly normal hearing. Mucous membranes are moist.  Neck: Appears normal, supple, no cervical masses, normal ROM, no appreciable thyromegaly; no JVD Respiratory: Diminished to auscultation bilaterally, no wheezing, rales, rhonchi or crackles. Normal respiratory effort and patient is not tachypenic. No accessory muscle use.  Unlabored breathing and she is wearing her 2 L of supplemental oxygen via nasal cannula Cardiovascular: RRR, no murmurs / rubs / gallops. S1 and S2 auscultated. No extremity edema.  Abdomen: Soft, non-tender, Distended. Bowel sounds positive x4.  GU: Deferred. Musculoskeletal: No clubbing / cyanosis of digits/nails. No joint deformity upper and lower extremities Skin: No rashes, lesions, ulcers on a limited skin evaluation but does have some bruising on her right arm. No induration; Warm and dry.  Neurologic: CN 2-12  grossly intact with no focal deficits. Romberg sign and cerebellar reflexes not assessed.  Psychiatric: Normal judgment and insight. Alert and oriented x 3. Pleasant mood and appropriate affect.   Data Reviewed: I have personally reviewed following labs and imaging studies  CBC: Recent Labs  Lab 05/15/19 2242 05/16/19 0805 05/17/19 0426 05/18/19 0243  WBC 5.1 4.1 3.0* 2.8*  NEUTROABS 3.5 2.3 2.1 2.0  HGB 11.0* 10.7* 9.6* 9.4*  HCT 34.5* 33.7* 30.6* 30.2*  MCV 93.8 94.1 94.7 95.6  PLT 145* 143* 122* 505*   Basic Metabolic Panel: Recent Labs  Lab 05/15/19 2242 05/16/19 0805 05/17/19 0426 05/18/19 0243  NA 138 139 140 138  K 3.5 3.7 3.8 4.5  CL 98 100 106 105  CO2 _0 GLUCOSE 154* 90 219* 300*  BUN 37* 38* 41* 49*  CREATININE 1.92* 2.00* 2.04* 2.18*  CALCIUM 9.4 9.2 9.1 9.1  MG  --  2.3 2.2 2.3  PHOS  --  4.2 4.4 4.6   GFR: Estimated Creatinine Clearance: 21.8 mL/min (A) (by C-G formula based on SCr of 2.18 mg/dL (H)).  Liver Function Tests: Recent Labs  Lab 05/15/19 2242 05/16/19 0805 05/17/19 0426 05/18/19 0243  AST 36 42* 33 27  ALT 34 _1 ALKPHOS 92 84 76 67  BILITOT 0.3 0.7 0.3 0.2*  PROT 7.4 7.3 6.4* 6.3*  ALBUMIN 3.9 3.9 3.4* 3.3*   No results for input(s): LIPASE, AMYLASE in the last 168 hours. No results for input(s): AMMONIA in the last 168 hours. Coagulation Profile: No results for input(s): INR, PROTIME in the last 168 hours. Cardiac Enzymes: No results for input(s): CKTOTAL, CKMB, CKMBINDEX, TROPONINI in the last 168 hours. BNP (last 3 results) No results for input(s): PROBNP in the last 8760 hours. HbA1C: Recent Labs    05/16/19 0805  HGBA1C 8.9*   CBG: Recent Labs  Lab 05/17/19 1311 05/17/19 1740 05/17/19 2133 05/18/19 0828 05/18/19 1102  GLUCAP 265* 252* 321* 213* 267*   Lipid Profile: Recent Labs    05/15/19 2242  TRIG 110   Thyroid Function Tests: No results for input(s): TSH, T4TOTAL, FREET4, T3FREE, THYROIDAB in the last 72 hours. Anemia Panel: Recent Labs    05/17/19 0426 05/17/19 0821 05/18/19 0243  VITAMINB12  --  1,274*  --   FOLATE  --  36.0  --   FERRITIN 129  --  147  TIBC  --  268  --   IRON  --  70  --   RETICCTPCT  --  1.5  --    Sepsis Labs: Recent Labs  Lab 05/15/19 2242 05/16/19 0147 05/16/19 0805 05/17/19 0426 05/18/19 0243  PROCALCITON <0.10  --  <0.10 <0.10 <0.10  LATICACIDVEN 0.9 0.6  --   --   --     Recent Results (from the past 240 hour(s))  SARS CORONAVIRUS 2 (TAT 6-24 HRS) Nasopharyngeal Nasopharyngeal Swab     Status: Abnormal   Collection Time: 05/15/19 10:42 PM   Specimen: Nasopharyngeal Swab  Result Value Ref Range Status   SARS Coronavirus 2 POSITIVE (A) NEGATIVE Final    Comment: RESULT CALLED TO, READ BACK BY AND VERIFIED WITH: Marisa Hua RN 9:40 05/16/19 ([email or web address]) (NOTE) SARS-CoV-2 target nucleic acids are DETECTED. The SARS-CoV-2 RNA is generally detectable in upper and lower respiratory  specimens during the acute phase of infection. Positive results are indicative of active infection with SARS-CoV-2. Clinical  correlation with patient history and other diagnostic information is  necessary to determine patient infection status. Positive results do  not rule out bacterial infection or co-infection with other viruses. The expected result is Negative. Fact Sheet for Patients: SugarRoll.be Fact Sheet for Healthcare Providers: https://www.woods-mathews.com/ This test is not yet approved or cleared by the [place] FDA and  has been authorized for detection and/or diagnosis of SARS-CoV-2 by FDA under an Emergency Use Authorization (EUA). This EUA will remain  in effect (meaning this test can be used) for  the duration of the COVID-19 declaration under Section 564(b)(1) of the Act, 21 U.S.C. section 360bbb-3(b)(1), unless the authorization is terminated or revoked sooner. Performed at [facility], [place] [address] [place], [place] [address]   Blood Culture (routine x 2)     Status: None (Preliminary result)   Collection Time: 05/15/19 10:42 PM   Specimen: BLOOD  Result Value Ref Range Status   Specimen Description   Final    BLOOD LEFT ANTECUBITAL Performed at [facility] [address], [place], [place] [address]    Special Requests   Final    BOTTLES DRAWN AEROBIC AND ANAEROBIC Blood Culture adequate volume Performed at [facility] [address], [place], [place] [address]    Culture   Final    NO GROWTH 2 DAYS Performed at [facility] [address], [place], [place] [address]    Report Status PENDING  Incomplete  Blood Culture (routine x 2)     Status: None (Preliminary result)   Collection Time: 05/15/19 10:47 PM   Specimen: BLOOD LEFT FOREARM  Result Value Ref Range Status   Specimen Description   Final    BLOOD LEFT FOREARM Performed at [facility], [place] [address], [place], [place] [address]    Special Requests   Final    BOTTLES DRAWN AEROBIC AND ANAEROBIC Blood Culture adequate volume Performed at [facility] [address] [place], [place] [address]    Culture   Final    NO GROWTH 2 DAYS Performed at [facility] [address], [place], [place] [address]    Report Status PENDING  Incomplete    Radiology Studies: Dg Chest Port 1 View  Result Date: 05/18/2019 CLINICAL DATA:  Shortness of breath EXAM: PORTABLE CHEST 1 VIEW COMPARISON:  Yesterday FINDINGS: Normal heart size and mediastinal contours. Prior CABG and left subclavian region stenting. Mild interstitial coarsening similar to prior. There is no edema, consolidation, effusion, or pneumothorax. Postoperative right breast. Artifact from EKG leads. IMPRESSION: No evidence of active disease Electronically Signed   By: Monte Fantasia M.D.   On: 05/18/2019 07:05   Dg Chest Port 1 View  Result Date: 05/17/2019 CLINICAL DATA:  Shortness of breath. EXAM: PORTABLE CHEST 1 VIEW COMPARISON:  05/15/2019 FINDINGS: 0434 hours. The lungs are clear without focal pneumonia, edema, pneumothorax or pleural effusion. The cardiopericardial silhouette is within normal limits for size. The visualized bony structures of the thorax are intact. Telemetry leads overlie the chest. IMPRESSION: No active disease. Electronically Signed   By: Misty Stanley M.D.   On: 05/17/2019 07:30   Scheduled Meds: . amLODipine  10 mg Oral Daily  . aspirin EC  81 mg Oral Daily  . atorvastatin  40 mg Oral QHS  . dexamethasone  6 mg Oral Daily  . ezetimibe  10 mg Oral Daily  . fluticasone  1 spray Each Nare Daily  . heparin  5,000 Units Subcutaneous Q8H  .  insulin aspart  0-15 Units Subcutaneous TID WC  . insulin aspart  0-5 Units Subcutaneous QHS  . insulin aspart  3 Units Subcutaneous TID WC  . insulin glargine  20 Units Subcutaneous QHS  . Ipratropium-Albuterol  1 puff Inhalation TID   . metoprolol succinate  25 mg Oral Daily  . mometasone-formoterol  2 puff Inhalation BID  . pantoprazole  40 mg Oral Daily  . sodium chloride flush  3 mL Intravenous Q12H   Continuous Infusions: . remdesivir 100 mg in NS 250 mL 100 mg (05/18/19 1057)    LOS: 2 days   Kerney Elbe, DO [facility] PAGER is on AMION  If 7PM-7AM, please contact night-coverage [email or web address]

## 2019-05-18 NOTE — Evaluation (Signed)
Physical Therapy Evaluation-1x Patient Details Name: Andrea [name] MRN: [id number] DOB: April 07, 1946 Today's Date: 05/18/2019   History of Present Illness  73 year old female admitted with fever/sepsis; found to be positive for COVID.  PMH:  DM, COPD, on 2 liters at baseline, HTN, CKD  Clinical Impression  On eval, pt was Ind with mobility. She walked several laps around her room without difficulty. SpO2: 91% on RA, dyspnea 1/4. No PT needs. 1x eval. Will sign off.     Follow Up Recommendations No PT follow up    Equipment Recommendations  None recommended by PT    Recommendations for Other Services       Precautions / Restrictions Precautions Precautions: None Precaution Comments: airborne (covid) Restrictions Weight Bearing Restrictions: No      Mobility  Bed Mobility Overal bed mobility: Independent                Transfers Overall transfer level: Independent               General transfer comment: good safety awareness  Ambulation/Gait Ambulation/Gait assistance: Independent Gait Distance (Feet): 45 Feet(in room only) Assistive device: None Gait Pattern/deviations: WFL(Within Functional Limits)     General Gait Details: SpO2: 91% on RA, dyspnea 1/4  Stairs            Wheelchair Mobility    Modified Rankin (Stroke Patients Only)       Balance Overall balance assessment: No apparent balance deficits (not formally assessed)                                           Pertinent Vitals/Pain Pain Assessment: No/denies pain    Home Living Family/patient expects to be discharged to:: Private residence Living Arrangements: Alone   Type of Home: Apartment Home Access: Elevator     Home Layout: One level Home Equipment: Bedside commode;Toilet riser;Shower seat;Walker - 4 wheels(02) Additional Comments: pt reports she was recently in rehab in [place]    Prior Function Level of Independence: Independent          Comments: (drives; sister gets groceries)     Journalist, newspaper        Extremity/Trunk Assessment   Upper Extremity Assessment Upper Extremity Assessment: Defer to OT evaluation    Lower Extremity Assessment Lower Extremity Assessment: Overall WFL for tasks assessed    Cervical / Trunk Assessment Cervical / Trunk Assessment: Normal  Communication   Communication: No difficulties  Cognition Arousal/Alertness: Awake/alert Behavior During Therapy: WFL for tasks assessed/performed Overall Cognitive Status: Within Functional Limits for tasks assessed                                        General Comments General comments (skin integrity, edema, etc.): removed 02 for a few minutes:  sats 92% but WOB increased    Exercises     Assessment/Plan    PT Assessment Patent does not need any further PT services  PT Problem List         PT Treatment Interventions      PT Goals (Current goals can be found in the Care Plan section)  Acute Rehab PT Goals Patient Stated Goal: home  PT Goal Formulation: All assessment and education complete, DC therapy    Frequency  Barriers to discharge        Co-evaluation   Reason for Co-Treatment: For patient/therapist safety PT goals addressed during session: Mobility/safety with mobility OT goals addressed during session: ADL's and self-care       AM-PAC PT "6 Clicks" Mobility  Outcome Measure Help needed turning from your back to your side while in a flat bed without using bedrails?: None Help needed moving from lying on your back to sitting on the side of a flat bed without using bedrails?: None Help needed moving to and from a bed to a chair (including a wheelchair)?: None Help needed standing up from a chair using your arms (e.g., wheelchair or bedside chair)?: None Help needed to walk in hospital room?: None Help needed climbing 3-5 steps with a railing? : None 6 Click Score: 24    End of Session    Activity Tolerance: Patient tolerated treatment well Patient left: in bed;with call bell/phone within reach        Time: 1520-1540 PT Time Calculation (min) (ACUTE ONLY): 20 min   Charges:   PT Evaluation $PT Eval Low Complexity: [place], PT Acute Rehabilitation Services Pager: [phone] Office: [phone]

## 2019-05-19 LAB — CBC WITH DIFFERENTIAL/PLATELET
Abs Immature Granulocytes: 0.01 10*3/uL (ref 0.00–0.07)
Basophils Absolute: 0 10*3/uL (ref 0.0–0.1)
Basophils Relative: 0 %
Eosinophils Absolute: 0 10*3/uL (ref 0.0–0.5)
Eosinophils Relative: 0 %
HCT: 31.8 % — ABNORMAL LOW (ref 36.0–46.0)
Hemoglobin: 9.8 g/dL — ABNORMAL LOW (ref 12.0–15.0)
Immature Granulocytes: 0 %
Lymphocytes Relative: 20 %
Lymphs Abs: 0.7 10*3/uL (ref 0.7–4.0)
MCH: 29.6 pg (ref 26.0–34.0)
MCHC: 30.8 g/dL (ref 30.0–36.0)
MCV: 96.1 fL (ref 80.0–100.0)
Monocytes Absolute: 0.2 10*3/uL (ref 0.1–1.0)
Monocytes Relative: 6 %
Neutro Abs: 2.6 10*3/uL (ref 1.7–7.7)
Neutrophils Relative %: 74 %
Platelets: 93 10*3/uL — ABNORMAL LOW (ref 150–400)
RBC: 3.31 MIL/uL — ABNORMAL LOW (ref 3.87–5.11)
RDW: 13.1 % (ref 11.5–15.5)
WBC: 3.5 10*3/uL — ABNORMAL LOW (ref 4.0–10.5)
nRBC: 0 % (ref 0.0–0.2)

## 2019-05-19 LAB — SEDIMENTATION RATE: Sed Rate: 28 mm/hr — ABNORMAL HIGH (ref 0–22)

## 2019-05-19 LAB — COMPREHENSIVE METABOLIC PANEL
ALT: 25 U/L (ref 0–44)
AST: 22 U/L (ref 15–41)
Albumin: 3.4 g/dL — ABNORMAL LOW (ref 3.5–5.0)
Alkaline Phosphatase: 66 U/L (ref 38–126)
Anion gap: 9 (ref 5–15)
BUN: 50 mg/dL — ABNORMAL HIGH (ref 8–23)
CO2: 23 mmol/L (ref 22–32)
Calcium: 9.1 mg/dL (ref 8.9–10.3)
Chloride: 108 mmol/L (ref 98–111)
Creatinine, Ser: 1.6 mg/dL — ABNORMAL HIGH (ref 0.44–1.00)
GFR calc Af Amer: 37 mL/min — ABNORMAL LOW (ref >60)
GFR calc non Af Amer: 32 mL/min — ABNORMAL LOW (ref >60)
Glucose, Bld: 298 mg/dL — ABNORMAL HIGH (ref 70–99)
Potassium: 4.7 mmol/L (ref 3.5–5.1)
Sodium: 140 mmol/L (ref 135–145)
Total Bilirubin: 0.3 mg/dL (ref 0.3–1.2)
Total Protein: 6.2 g/dL — ABNORMAL LOW (ref 6.5–8.1)

## 2019-05-19 LAB — FIBRINOGEN: Fibrinogen: 456 mg/dL (ref 210–475)

## 2019-05-19 LAB — LACTATE DEHYDROGENASE: LDH: 147 U/L (ref 98–192)

## 2019-05-19 LAB — FERRITIN: Ferritin: 176 ng/mL (ref 11–307)

## 2019-05-19 LAB — C-REACTIVE PROTEIN: CRP: <0.8 mg/dL (ref <1.0)

## 2019-05-19 LAB — GLUCOSE, CAPILLARY
Glucose-Capillary: 168 mg/dL — ABNORMAL HIGH (ref 70–99)
Glucose-Capillary: 251 mg/dL — ABNORMAL HIGH (ref 70–99)
Glucose-Capillary: 176 mg/dL — ABNORMAL HIGH (ref 70–99)
Glucose-Capillary: 236 mg/dL — ABNORMAL HIGH (ref 70–99)

## 2019-05-19 LAB — MAGNESIUM: Magnesium: 2.3 mg/dL (ref 1.7–2.4)

## 2019-05-19 LAB — D-DIMER, QUANTITATIVE: D-Dimer, Quant: 0.72 ug/mL-FEU — ABNORMAL HIGH (ref 0.00–0.50)

## 2019-05-19 LAB — PHOSPHORUS: Phosphorus: 4 mg/dL (ref 2.5–4.6)

## 2019-05-19 MED ORDER — ADULT MULTIVITAMIN W/MINERALS CH
1.0000 | ORAL_TABLET | Freq: Every day | ORAL | Status: DC
  Administered 2019-05-19 – 2019-05-20 (×2): 1 via ORAL
  Filled 2019-05-19 (×2): qty 1

## 2019-05-19 MED ORDER — FUROSEMIDE 40 MG PO TABS
40.0000 mg | ORAL_TABLET | Freq: Every day | ORAL | Status: DC
  Administered 2019-05-19 – 2019-05-20 (×2): 40 mg via ORAL
  Filled 2019-05-19 (×2): qty 1

## 2019-05-19 MED ORDER — INSULIN ASPART 100 UNIT/ML ~~LOC~~ SOLN
0.0000 [IU] | Freq: Every day | SUBCUTANEOUS | Status: DC
  Administered 2019-05-19: 2 [IU] via SUBCUTANEOUS

## 2019-05-19 MED ORDER — INSULIN ASPART 100 UNIT/ML ~~LOC~~ SOLN
6.0000 [IU] | Freq: Three times a day (TID) | SUBCUTANEOUS | Status: DC
  Administered 2019-05-19 – 2019-05-20 (×3): 6 [IU] via SUBCUTANEOUS

## 2019-05-19 MED ORDER — INSULIN ASPART 100 UNIT/ML ~~LOC~~ SOLN
0.0000 [IU] | Freq: Three times a day (TID) | SUBCUTANEOUS | Status: DC
  Administered 2019-05-19: 4 [IU] via SUBCUTANEOUS
  Administered 2019-05-19: 11 [IU] via SUBCUTANEOUS
  Administered 2019-05-20: 4 [IU] via SUBCUTANEOUS
  Administered 2019-05-20: 7 [IU] via SUBCUTANEOUS

## 2019-05-19 MED ORDER — INSULIN GLARGINE 100 UNIT/ML ~~LOC~~ SOLN
25.0000 [IU] | Freq: Every day | SUBCUTANEOUS | Status: DC
  Administered 2019-05-19: 21:00:00 25 [IU] via SUBCUTANEOUS
  Filled 2019-05-19 (×2): qty 0.25

## 2019-05-19 MED ORDER — NITROGLYCERIN 0.4 MG SL SUBL: 0.4000 mg | SUBLINGUAL_TABLET | SUBLINGUAL | Status: DC | PRN

## 2019-05-19 NOTE — Progress Notes (Signed)
Inpatient Diabetes Program Recommendations  AACE/ADA: New Consensus Statement on Inpatient Glycemic Control (2015)  Target Ranges:  Prepandial:   less than 140 mg/dL      Peak postprandial:   less than 180 mg/dL (1-2 hours)      Critically ill patients:  140 - 180 mg/dL   Lab Results  Component Value Date   GLUCAP 345 (H) 05/18/2019   HGBA1C 8.9 (H) 05/16/2019    Review of Glycemic Control  Results for Andrea [name], Andrea [name] (MRN [id number]) as of 05/19/2019 08:45  Ref. Range 05/18/2019 08:28 05/18/2019 11:02 05/18/2019 16:23 05/18/2019 20:50  Glucose-Capillary Latest Ref Range: 70 - 99 mg/dL 213 (H) 267 (H) 250 (H) 345 (H)   Consult for glycemic control in the setting of steroids for COVID  Diabetes history: DM 2 Outpatient /Diabetes medications: Glipizide 10 mg bid, anuvia 50 mg Daily Current orders for Inpatient glycemic control:  Lantus 15 units Novolog 0-20 units tid + hs Novolog 3 units tid meal coverage  Decadron 6 mg Daily BUN/Creat: 49/2.18  Inpatient Diabetes Program Recommendations:    Consider increasing Lantus to 30 units tonight.  Consider increasing Novolog meal coverage to 10 units tid if eating >50%.  Thanks,  Tama Headings RN, MSN, BC-ADM Inpatient Diabetes Coordinator Team Pager [phone] (8a-5p)

## 2019-05-19 NOTE — Care Management Important Message (Signed)
Important Message  Patient Details IM Letter given to Cookie McGibboney RN to present to the Patient Name: Andrea [name] MRN: [id number] Date of Birth: [birth year]   Medicare Important Message Given:  Yes     Kerin Salen 05/19/2019, 11:08 AM

## 2019-05-19 NOTE — Progress Notes (Signed)
PROGRESS NOTE    Andrea [name]  BSW:[id number] DOB: 1946-06-03 DOA: 05/15/2019 PCP: Chester Holstein, MD   Brief Narrative:  The patient is an overweight 73 year old Caucasian female with a past medical history significant for type 2 diabetes mellitus, chronic respiratory failure in setting of COPD on 2 L of supplemental oxygen at home, hypertension, hyperlipidemia, chronic kidney disease stage III with a baseline creatinine of 1.6-1.9 as well as other comorbidities who presented to [facility] with a fever and a sepsis-like picture.  She had a T-max at home noted to be over 101 with associated chills.  She also had a nonproductive cough as well as rhinorrhea and associated mild shortness of breath which was new for her over the last day or so.  She denies any recent travel or any Covid exposures and underwent CABG in February 2020 and has been on oxygen since then.    In the ED she had a T-max of 100.7 with O2 saturations 98 to 100% on her baseline 2 L of supplemental oxygen.  Chest x-ray showed no evidence of acute cardiopulmonary process and Covid testing was ordered and was positive for SARS-CoV-2.  Patient appeared to have a sepsis-like picture but this is likely secondary to SIRS from her COVID-19 disease as it was mild.  Inflammatory markers are trending down patient is feeling better today.  We will have the patient ambulate with physical therapy  Assessment & Plan:   Principal Problem:   Sepsis ([place]) Active Problems:   Atherosclerotic heart disease of native coronary artery without angina pectoris   Chronic kidney disease   GERD (gastroesophageal reflux disease)   Hypertension, benign   H/O TIA (transient ischemic attack) and stroke   Fever   Pancytopenia ([place])  Sepsis ruled Out; she has SIRS secondary to COVID-19 disease and COVID-19 infection -SIRS criteria met via presenting objective fever as well as tachypnea from COVID-19 disease. -In the setting of presenting  objective fever, chills, new onset cough, new onset shortness of breath, new onset rhinorrhea, there is a clinical index of suspicion for underlying COVID-19.Associated nasopharyngeal swab was obtained this evening, with result currently pending. Criteria not met for the patient's sepsis to be considered severe nature given the absence of any associated evidence of endorgan damage., Including nonelevated presenting lactic acid.  -Of note, Admission chest x-rays shows no evidence of acute cardiopulmonary process, including no evidence of infiltrate or edema.  -Presenting urinalysis shows insignificant pyuria in the context of the absence of urinary complaints and does show trace leukocytes, no bacteria, and 11-20 WBCs.  -Repeat CXR yesterday AM showed "No evidence of active disease." -Patient also denies any associated headache, neck stiffness, rash,nausea, vomiting, diarrhea, or abdominal discomfort. -Rapid influenza evaluate negative. -COVID-19 nasopharyngeal swab was POSITIVE .  -Continue to monitor for results blood cultures and is pending at this time -Continue with supportive care -Trend inflammatory markers; procalcitonin level was less than 0.10x2, triglycerides were 70, ESR was 46 -> 45 -> 35 -> 28, and fibrinogen level was 596 -> 530 -> 432 -> 456 -Inflammatory Marker Trend: Recent Labs    05/17/19 0426 05/18/19 0243 05/19/19 0355  DDIMER 1.50* 0.99* 0.72*  FERRITIN 129 147 176  LDH 145 151 147  CRP <0.8 <0.8 <0.8    Lab Results  Component Value Date   SARSCOV2NAA POSITIVE (A) 05/15/2019  -Started the patient on dexamethasone 6 mg IV every 24 hours and will continue for at least 10 days and changed to po  -  Continue fluticasone 1 spray in each nare daily, as needed albuterol inhaler 2 puffs every 6 hours as needed for wheezing or shortness breath, scheduled Combivent 1 puff every 6, as well as Dulera 2 puffs twice daily -We will hold her Incruse Ellipta -We will also give  Remdesivir 5-day treatment and she has 1 more doses of treatment after today -Resumed home Lasix 40 mg po Daily  -Continue Trend inflammatory markers daily and repeat chest x-ray in a.m.  Type 2 Diabetes Mellitus with Hyperglycemia -Does not require exogenous insulin as an outpatient, rather, the patient was on glipizide as well as Januvia at home.  -Presenting blood sugar per presenting CMP noted to be 154.  This morning blood sugar on CMP was 300 -Hold home Januvia and glipizide during this hospitalization.  -Ordered Accu-Cheks q. before meals and at bedtime with associated sliding scale insulin. -Continue to Monitor CBG's carefully; CBGs were elevated and ranged from 168-345 -Checked a hemoglobin A1c and was 8.9 -Started the patient on insulin glargine 15 units subcu nightly and increased to 25 units -Will increase SSI to Resistant  AC/HS and increase 3 units TIDwm to 6 units TIDwm -Continue to monitor blood sugars carefully and adjust insulin regimen as necessary  Chronic Hypoxic Respiratory failure in the setting of COPD and now COVID 19 Disease -Patient is on 2 L continuous supplemental oxygen at baseline, without any increase in baseline oxygen requirements presentation thus far.  -Outpatient respiratory regimen include Symbicort, Spiriva, and as needed albuterol inhaler and have substituted as above  -Continue home respiratory regimen, with as needed titration of supplemental oxygen order to maintain oxygen saturations greater than or equal to 92%.  -Patient is doing well from a respiratory standpoint but complains of having a dry hacking cough but feel it is improving   Essential Hypertension -On Norvasc as an outpatient.  -No evidence of hypotension thus far, however, in the setting of septic presentation, her antihypertensives were held but resumed Amlodipine yesterday  -Resumed Home Lasix today  -BP was 163/61 -Continue to monitor blood pressures per protocol  Stage  III Chronic Kidney Disease -Associated baseline creatinine of 1.6-1.9. -Presenting labs reflect serum creatinine with in this baseline range. -Gentle IVF Hydration with NS at 75 mL/hr is now stopped; patient BUN/creatinine went from 38/2.00 -> 41/2.04 -> 49/2.18 -> 50/1.60 and will resume home Lasix as she had some mild Leg Swelling  -Monitor strict I's and O's and daily weights.  -Attempt to avoid nephrotoxic agents and Renally Dose Medications.  -Repeat CMP in the morning.  GERD -On Omeprazole as an outpatient. -Continue home PPI with Formulary Substitution of Pantoprazole   Normocytic Anemia -Likely anemia of chronic kidney disease -The patient's hemoglobin/hematocrit went from 11.0/34.5 and is now 9.8/31.8 -Likely also partly because of a dilutional drop from IV fluid hydration -Checked anemia panel this am and showed an iron level of 70, U IBC 198, TIBC of 268, saturation ratios of 26%, ferritin level of 129, folate level 36.0, and vitamin B12 levels 1274 -Continue to monitor for signs and symptoms of bleeding; currently no overt bleeding noted -Repeat CBC in a.m.  Thrombocytopenia, worsening   -Patient platelet count on admission was 145,000 -> 122,000 -> 102,000 -> 93,000 -Continue to monitor for signs and symptoms of bleeding: Currently no overt bleeding noted but has some arm bruising on the Right Arm -Repeat CBC in a.m.  Pancytopenia -? Dilutional Drop vs. COVID vs. Medication induced -Will save Smear in AM -Patient's WBC went from  5.1 -> 3.0 -> 2.8 -> 3.5 , Hb/Hct was 9.4/30.2 and improved to 9.8/31.8, and Platelet Count is now 93,000; patient does have lymphopenia consistent with Covid -Since continued to worsen will have Hematology evaluate further and Dr. Burr Medico to review the chart and Dr. Burr Medico believes this is related to her COVID-19 disease and not secondary to Remdesivir -Continue to Monitor and Trend; Repeat CBC in AM   Elevated AST, improved  -Likely in the  setting of COVID-19 disease; AST was 42 on admission and is now 22 -Continue to monitor and trend LFTs -If necessary will obtain a right upper quadrant ultrasound -Repeat CBC in a.m.  CAD with recent CABG  -Continue with Aspirin 81 mg p.o. daily along with atorvastatin 40 mg p.o. nightly -Also will continue with metoprolol succinate 25 mg p.o. daily  Hyperlipidemia -Continue atorvastatin 40 minutes p.o. nightly and continue with ezetimibe 10 mg Daily.  DVT prophylaxis: Heparin 5,000 units sq q8h Code Status: FULL CODE  Family Communication: No family present at bedside  Disposition Plan: Pending completion of Remdesivir and anticipate D/C Home tomorrow as PT recommended no follow up  Consultants:   Discussed case with Oncology    Procedures:  None  Antimicrobials: Anti-infectives (From admission, onward)   Start     Dose/Rate Route Frequency Ordered Stop   05/17/19 1000  remdesivir 100 mg in sodium chloride 0.9 % 250 mL IVPB     100 mg 500 mL/hr over 30 Minutes Intravenous Every 24 hours 05/16/19 1219 05/21/19 0959   05/16/19 1400  remdesivir 200 mg in sodium chloride 0.9 % 250 mL IVPB     200 mg 500 mL/hr over 30 Minutes Intravenous Once 05/16/19 1219 05/16/19 1721     Subjective: Seen and examined at bedside and states that she is doing well.  States that she still has a cough but not as bad.  States her legs were little bit swollen today but thinks are starting to go down after she is elevated in.  No nausea or vomiting.  Will resume her home Lasix dosing and she is agreeable with this.  No other concerns or complaints at this time.   Objective: Vitals:   05/18/19 1107 05/18/19 2054 05/19/19 0440 05/19/19 1323  BP: (!) 163/61 (!) 138/54 136/64 (!) 123/57  Pulse: 69 61 60 (!) 59  Resp: _0 Temp: 98.4 F (36.9 C) 97.7 F (36.5 C) 98.3 F (36.8 C) 98.2 F (36.8 C)  TempSrc: Oral Oral Oral Oral  SpO2: 100% 100% 100% 100%  Weight:      Height:         Intake/Output Summary (Last 24 hours) at 05/19/2019 1347 Last data filed at 05/19/2019 2836 Gross per 24 hour  Intake 600 ml  Output -  Net 600 ml   Filed Weights   05/15/19 2219 05/16/19 0942  Weight: 71.7 kg 72.8 kg   Examination: Physical Exam:  Constitutional: WN/WD overweight Caucasian female in NAD and appears calm  Eyes: Lids and conjunctivae normal, sclerae anicteric  ENMT: External Ears, Nose appear normal. Grossly normal hearing. Mucous membranes are moist.  Neck: Appears normal, supple, no cervical masses, normal ROM, no appreciable thyromegaly; no JVD Respiratory: Diminished to auscultation bilaterally, no wheezing, rales, rhonchi or crackles. Normal respiratory effort and patient is not tachypenic. No accessory muscle use. Unlabored breathing wearing 2 Liters of Supplemental O2 via [place]. Cardiovascular: RRR, no murmurs / rubs / gallops. S1 and S2 auscultated. Trace to 1+ extremity  edema.  Abdomen: Soft, non-tender, Distended due to body habitus. Bowel sounds positive x4.  GU: Deferred. Musculoskeletal: No clubbing / cyanosis of digits/nails. No joint deformity upper and lower extremities Skin: No rashes, lesions, ulcers on a limited skin evaluation but had some mild bruising. No induration; Warm and dry.  Neurologic: CN 2-12 grossly intact with no focal deficits. Romberg sign cerebellar reflexes not assessed.  Psychiatric: Normal judgment and insight. Alert and oriented x 3. Pleasant mood and appropriate affect.   Data Reviewed: I have personally reviewed following labs and imaging studies  CBC: Recent Labs  Lab 05/15/19 2242 05/16/19 0805 05/17/19 0426 05/18/19 0243 05/19/19 0355  WBC 5.1 4.1 3.0* 2.8* 3.5*  NEUTROABS 3.5 2.3 2.1 2.0 2.6  HGB 11.0* 10.7* 9.6* 9.4* 9.8*  HCT 34.5* 33.7* 30.6* 30.2* 31.8*  MCV 93.8 94.1 94.7 95.6 96.1  PLT 145* 143* 122* 102* 93*   Basic Metabolic Panel: Recent Labs  Lab 05/15/19 2242 05/16/19 0805 05/17/19 0426 05/18/19  0243 05/19/19 0355  NA 138 139 140 138 140  K 3.5 3.7 3.8 4.5 4.7  CL 98 100 106 105 108  CO2 _0 GLUCOSE 154* 90 219* 300* 298*  BUN 37* 38* 41* 49* 50*  CREATININE 1.92* 2.00* 2.04* 2.18* 1.60*  CALCIUM 9.4 9.2 9.1 9.1 9.1  MG  --  2.3 2.2 2.3 2.3  PHOS  --  4.2 4.4 4.6 4.0   GFR: Estimated Creatinine Clearance: 29.7 mL/min (A) (by C-G formula based on SCr of 1.6 mg/dL (H)). Liver Function Tests: Recent Labs  Lab 05/15/19 2242 05/16/19 0805 05/17/19 0426 05/18/19 0243 05/19/19 0355  AST 36 42* 33 27 22  ALT 34 _1 ALKPHOS 92 84 76 67 66  BILITOT 0.3 0.7 0.3 0.2* 0.3  PROT 7.4 7.3 6.4* 6.3* 6.2*  ALBUMIN 3.9 3.9 3.4* 3.3* 3.4*   No results for input(s): LIPASE, AMYLASE in the last 168 hours. No results for input(s): AMMONIA in the last 168 hours. Coagulation Profile: No results for input(s): INR, PROTIME in the last 168 hours. Cardiac Enzymes: No results for input(s): CKTOTAL, CKMB, CKMBINDEX, TROPONINI in the last 168 hours. BNP (last 3 results) No results for input(s): PROBNP in the last 8760 hours. HbA1C: No results for input(s): HGBA1C in the last 72 hours. CBG: Recent Labs  Lab 05/18/19 1102 05/18/19 1623 05/18/19 2050 05/19/19 0852 05/19/19 1142  GLUCAP 267* 250* 345* 168* 251*   Lipid Profile: No results for input(s): CHOL, HDL, LDLCALC, TRIG, CHOLHDL, LDLDIRECT in the last 72 hours. Thyroid Function Tests: No results for input(s): TSH, T4TOTAL, FREET4, T3FREE, THYROIDAB in the last 72 hours. Anemia Panel: Recent Labs    05/17/19 0821 05/18/19 0243 05/19/19 0355  VITAMINB12 1,274*  --   --   FOLATE 36.0  --   --   FERRITIN  --  147 176  TIBC 268  --   --   IRON 70  --   --   RETICCTPCT 1.5  --   --    Sepsis Labs: Recent Labs  Lab 05/15/19 2242 05/16/19 0147 05/16/19 0805 05/17/19 0426 05/18/19 0243  PROCALCITON <0.10  --  <0.10 <0.10 <0.10  LATICACIDVEN 0.9 0.6  --   --   --     Recent Results (from the past  240 hour(s))  SARS CORONAVIRUS 2 (TAT 6-24 HRS) Nasopharyngeal Nasopharyngeal Swab     Status: Abnormal   Collection Time: 05/15/19 10:42 PM  Specimen: Nasopharyngeal Swab  Result Value Ref Range Status   SARS Coronavirus 2 POSITIVE (A) NEGATIVE Final    Comment: RESULT CALLED TO, READ BACK BY AND VERIFIED WITH: Marisa Hua RN 9:40 05/16/19 ([email or web address]) (NOTE) SARS-CoV-2 target nucleic acids are DETECTED. The SARS-CoV-2 RNA is generally detectable in upper and lower respiratory specimens during the acute phase of infection. Positive results are indicative of active infection with SARS-CoV-2. Clinical  correlation with patient history and other diagnostic information is necessary to determine patient infection status. Positive results do  not rule out bacterial infection or co-infection with other viruses. The expected result is Negative. Fact Sheet for Patients: SugarRoll.be Fact Sheet for Healthcare Providers: https://www.woods-mathews.com/ This test is not yet approved or cleared by the [place] FDA and  has been authorized for detection and/or diagnosis of SARS-CoV-2 by FDA under an Emergency Use Authorization (EUA). This EUA will remain  in effect (meaning this test can be used) for  the duration of the COVID-19 declaration under Section 564(b)(1) of the Act, 21 U.S.C. section 360bbb-3(b)(1), unless the authorization is terminated or revoked sooner. Performed at [facility], [place] [address] [place], [place] [address]   Blood Culture (routine x 2)     Status: None (Preliminary result)   Collection Time: 05/15/19 10:42 PM   Specimen: BLOOD  Result Value Ref Range Status   Specimen Description   Final    BLOOD LEFT ANTECUBITAL Performed at [place] [facility] [address] [place], [place] [address]    Special Requests   Final    BOTTLES DRAWN AEROBIC AND ANAEROBIC Blood Culture adequate volume Performed at  [facility] [address], [place], [place] [address]    Culture   Final    NO GROWTH 3 DAYS Performed at [facility], [place] [address], [place], [place] [address]    Report Status PENDING  Incomplete  Blood Culture (routine x 2)     Status: None (Preliminary result)   Collection Time: 05/15/19 10:47 PM   Specimen: BLOOD LEFT FOREARM  Result Value Ref Range Status   Specimen Description   Final    BLOOD LEFT FOREARM Performed at [facility], [place] [address], [place], [place] [address]    Special Requests   Final    BOTTLES DRAWN AEROBIC AND ANAEROBIC Blood Culture adequate volume Performed at [place] [address], [place], [place] [address]    Culture   Final    NO GROWTH 3 DAYS Performed at [facility], [place] [address], [place], [place] [address]    Report Status PENDING  Incomplete    Radiology Studies: Dg Chest Port 1 View  Result Date: 05/18/2019 CLINICAL DATA:  Shortness of breath EXAM: PORTABLE CHEST 1 VIEW COMPARISON:  Yesterday FINDINGS: Normal heart size and mediastinal contours. Prior CABG and left subclavian region stenting. Mild interstitial coarsening similar to prior. There is no edema, consolidation, effusion, or pneumothorax. Postoperative right breast. Artifact from EKG leads. IMPRESSION: No evidence of active disease Electronically Signed   By: Monte Fantasia M.D.   On: 05/18/2019 07:05   Scheduled Meds: . amLODipine  10 mg Oral Daily  . aspirin EC  81 mg Oral Daily  . atorvastatin  40 mg Oral QHS  . dexamethasone  6 mg Oral Daily  . ezetimibe  10 mg Oral Daily  . fluticasone  1 spray Each Nare Daily  . furosemide  40 mg Oral Daily  .  heparin  5,000 Units Subcutaneous Q8H  . insulin aspart  0-20 Units Subcutaneous TID WC  . insulin aspart  0-5 Units Subcutaneous QHS  . insulin aspart  6 Units Subcutaneous TID WC  . insulin glargine  25 Units Subcutaneous QHS  . Ipratropium-Albuterol  1  puff Inhalation TID  . metoprolol succinate  25 mg Oral Daily  . mometasone-formoterol  2 puff Inhalation BID  . multivitamin with minerals  1 tablet Oral Daily  . pantoprazole  40 mg Oral Daily  . sodium chloride flush  3 mL Intravenous Q12H   Continuous Infusions: . remdesivir 100 mg in NS 250 mL 100 mg (05/19/19 1018)    LOS: 3 days   Kerney Elbe, DO [facility] PAGER is on AMION  If 7PM-7AM, please contact night-coverage [email or web address]

## 2019-05-20 ENCOUNTER — Inpatient Hospital Stay (HOSPITAL_COMMUNITY): Payer: Medicare Other

## 2019-05-20 LAB — CBC WITH DIFFERENTIAL/PLATELET
Abs Immature Granulocytes: 0.04 10*3/uL (ref 0.00–0.07)
Basophils Absolute: 0 10*3/uL (ref 0.0–0.1)
Basophils Relative: 0 %
Eosinophils Absolute: 0 10*3/uL (ref 0.0–0.5)
Eosinophils Relative: 0 %
HCT: 31 % — ABNORMAL LOW (ref 36.0–46.0)
Hemoglobin: 9.9 g/dL — ABNORMAL LOW (ref 12.0–15.0)
Immature Granulocytes: 1 %
Lymphocytes Relative: 20 %
Lymphs Abs: 1.1 10*3/uL (ref 0.7–4.0)
MCH: 30 pg (ref 26.0–34.0)
MCHC: 31.9 g/dL (ref 30.0–36.0)
MCV: 93.9 fL (ref 80.0–100.0)
Monocytes Absolute: 0.3 10*3/uL (ref 0.1–1.0)
Monocytes Relative: 5 %
Neutro Abs: 3.9 10*3/uL (ref 1.7–7.7)
Neutrophils Relative %: 74 %
Platelets: 112 10*3/uL — ABNORMAL LOW (ref 150–400)
RBC: 3.3 MIL/uL — ABNORMAL LOW (ref 3.87–5.11)
RDW: 13.1 % (ref 11.5–15.5)
WBC: 5.4 10*3/uL (ref 4.0–10.5)
nRBC: 0 % (ref 0.0–0.2)

## 2019-05-20 LAB — COMPREHENSIVE METABOLIC PANEL
ALT: 27 U/L (ref 0–44)
AST: 23 U/L (ref 15–41)
Albumin: 3.4 g/dL — ABNORMAL LOW (ref 3.5–5.0)
Alkaline Phosphatase: 67 U/L (ref 38–126)
Anion gap: 10 (ref 5–15)
BUN: 56 mg/dL — ABNORMAL HIGH (ref 8–23)
CO2: 24 mmol/L (ref 22–32)
Calcium: 9.1 mg/dL (ref 8.9–10.3)
Chloride: 104 mmol/L (ref 98–111)
Creatinine, Ser: 1.76 mg/dL — ABNORMAL HIGH (ref 0.44–1.00)
GFR calc Af Amer: 33 mL/min — ABNORMAL LOW (ref >60)
GFR calc non Af Amer: 28 mL/min — ABNORMAL LOW (ref >60)
Glucose, Bld: 270 mg/dL — ABNORMAL HIGH (ref 70–99)
Potassium: 4.5 mmol/L (ref 3.5–5.1)
Sodium: 138 mmol/L (ref 135–145)
Total Bilirubin: 0.3 mg/dL (ref 0.3–1.2)
Total Protein: 6.3 g/dL — ABNORMAL LOW (ref 6.5–8.1)

## 2019-05-20 LAB — FIBRINOGEN: Fibrinogen: 411 mg/dL (ref 210–475)

## 2019-05-20 LAB — C-REACTIVE PROTEIN: CRP: 1.3 mg/dL — ABNORMAL HIGH (ref <1.0)

## 2019-05-20 LAB — SEDIMENTATION RATE: Sed Rate: 38 mm/hr — ABNORMAL HIGH (ref 0–22)

## 2019-05-20 LAB — GLUCOSE, CAPILLARY
Glucose-Capillary: 223 mg/dL — ABNORMAL HIGH (ref 70–99)
Glucose-Capillary: 167 mg/dL — ABNORMAL HIGH (ref 70–99)

## 2019-05-20 LAB — MAGNESIUM: Magnesium: 2.3 mg/dL (ref 1.7–2.4)

## 2019-05-20 LAB — D-DIMER, QUANTITATIVE: D-Dimer, Quant: 0.61 ug/mL-FEU — ABNORMAL HIGH (ref 0.00–0.50)

## 2019-05-20 LAB — PHOSPHORUS: Phosphorus: 4 mg/dL (ref 2.5–4.6)

## 2019-05-20 LAB — FERRITIN: Ferritin: 182 ng/mL (ref 11–307)

## 2019-05-20 LAB — LACTATE DEHYDROGENASE: LDH: 157 U/L (ref 98–192)

## 2019-05-20 MED ORDER — HYDROCOD POLST-CPM POLST ER 10-8 MG/5ML PO SUER: 5.0000 mL | Freq: Two times a day (BID) | ORAL | 0 refills | Status: DC | PRN

## 2019-05-20 MED ORDER — FUROSEMIDE 10 MG/ML IJ SOLN
20.0000 mg | Freq: Once | INTRAMUSCULAR | Status: AC
  Administered 2019-05-20: 20 mg via INTRAVENOUS
  Filled 2019-05-20: qty 2

## 2019-05-20 MED ORDER — PREDNISONE 10 MG (21) PO TBPK: ORAL_TABLET | ORAL | 0 refills | Status: DC

## 2019-05-20 NOTE — Progress Notes (Signed)
Inpatient Diabetes Program Recommendations  AACE/ADA: New Consensus Statement on Inpatient Glycemic Control (2015)  Target Ranges:  Prepandial:   less than 140 mg/dL      Peak postprandial:   less than 180 mg/dL (1-2 hours)      Critically ill patients:  140 - 180 mg/dL   Lab Results  Component Value Date   GLUCAP 223 (H) 05/20/2019   HGBA1C 8.9 (H) 05/16/2019    Review of Glycemic Control  Results for Andrea [name], Andrea [name] (MRN [id number]) as of 05/20/2019 09:17  Ref. Range 05/19/2019 08:52 05/19/2019 11:42 05/19/2019 16:45 05/19/2019 20:51 05/20/2019 07:42  Glucose-Capillary Latest Ref Range: 70 - 99 mg/dL 168 (H) 251 (H) 176 (H) 236 (H) 223 (H)    Consult for glycemic control in the setting of steroids for COVID  Diabetes history: DM 2 Outpatient /Diabetes medications: Glipizide 10 mg bid, Januvia 50 mg Daily Current orders for Inpatient glycemic control:  Lantus 25 units Novolog 0-20 units tid + hs Novolog 6 units tid meal coverage  Decadron 6 mg Daily BUN/Creat: 56/1.76  Inpatient Diabetes Program Recommendations:    Consider increasing Lantus to 30 units tonight.  Consider increasing Novolog meal coverage to 8-10 units tid if eating >50%.  Thanks,  Tama Headings RN, MSN, BC-ADM Inpatient Diabetes Coordinator Team Pager [phone] (8a-5p)

## 2019-05-20 NOTE — Plan of Care (Signed)
  Problem: Education: Goal: Knowledge of General Education information will improve Description: Including pain rating scale, medication(s)/side effects and non-pharmacologic comfort measures Outcome: Adequate for Discharge   Problem: Health Behavior/Discharge Planning: Goal: Ability to manage health-related needs will improve Outcome: Adequate for Discharge   Problem: Clinical Measurements: Goal: Ability to maintain clinical measurements within normal limits will improve Outcome: Adequate for Discharge Goal: Will remain free from infection Outcome: Adequate for Discharge Goal: Diagnostic test results will improve Outcome: Adequate for Discharge Goal: Respiratory complications will improve Outcome: Adequate for Discharge Goal: Cardiovascular complication will be avoided Outcome: Adequate for Discharge   Problem: Activity: Goal: Risk for activity intolerance will decrease Outcome: Adequate for Discharge   Problem: Nutrition: Goal: Adequate nutrition will be maintained Outcome: Adequate for Discharge   Problem: Coping: Goal: Level of anxiety will decrease Outcome: Adequate for Discharge   Problem: Elimination: Goal: Will not experience complications related to bowel motility Outcome: Adequate for Discharge Goal: Will not experience complications related to urinary retention Outcome: Adequate for Discharge   Problem: Pain Managment: Goal: General experience of comfort will improve Outcome: Adequate for Discharge   Problem: Safety: Goal: Ability to remain free from injury will improve Outcome: Adequate for Discharge   Problem: Skin Integrity: Goal: Risk for impaired skin integrity will decrease Outcome: Adequate for Discharge   Problem: Education: Goal: Knowledge of General Education information will improve Description: Including pain rating scale, medication(s)/side effects and non-pharmacologic comfort measures Outcome: Adequate for Discharge   Problem: Health  Behavior/Discharge Planning: Goal: Ability to manage health-related needs will improve Outcome: Adequate for Discharge   Problem: Clinical Measurements: Goal: Ability to maintain clinical measurements within normal limits will improve Outcome: Adequate for Discharge Goal: Will remain free from infection Outcome: Adequate for Discharge Goal: Diagnostic test results will improve Outcome: Adequate for Discharge Goal: Respiratory complications will improve Outcome: Adequate for Discharge Goal: Cardiovascular complication will be avoided Outcome: Adequate for Discharge   Problem: Activity: Goal: Risk for activity intolerance will decrease Outcome: Adequate for Discharge   Problem: Nutrition: Goal: Adequate nutrition will be maintained Outcome: Adequate for Discharge   Problem: Clinical Measurements: Goal: Ability to maintain clinical measurements within normal limits will improve Outcome: Adequate for Discharge Goal: Will remain free from infection Outcome: Adequate for Discharge Goal: Diagnostic test results will improve Outcome: Adequate for Discharge Goal: Respiratory complications will improve Outcome: Adequate for Discharge Goal: Cardiovascular complication will be avoided Outcome: Adequate for Discharge   Problem: Coping: Goal: Level of anxiety will decrease Outcome: Adequate for Discharge   Problem: Elimination: Goal: Will not experience complications related to bowel motility Outcome: Adequate for Discharge Goal: Will not experience complications related to urinary retention Outcome: Adequate for Discharge   Problem: Pain Managment: Goal: General experience of comfort will improve Outcome: Adequate for Discharge   Problem: Safety: Goal: Ability to remain free from injury will improve Outcome: Adequate for Discharge   Problem: Skin Integrity: Goal: Risk for impaired skin integrity will decrease Outcome: Adequate for Discharge

## 2019-05-20 NOTE — Discharge Summary (Signed)
Physician Discharge Summary  Andrea [name] JHE:[id number] DOB: [birth year] DOA: 05/15/2019  PCP: Chester Holstein, MD  Admit date: 05/15/2019 Discharge date: 05/20/2019  Admitted From: Home Disposition: Home  Recommendations for Outpatient Follow-up:  1. Follow up with PCP in 1-2 weeks 2. Follow up with Nephrology within 1-2 weeks 3. Follow up with Cardiology in the outpatient setting  4. Please obtain CMP/CB, Mag, Phos, ESR, CRP, D-Dimer, Ferritin, Fibrinogen in one week 5. Please follow up on the following pending results:  Home Health: No Equipment/Devices: None    Discharge Condition: Stable CODE STATUS: FULL CODE Diet recommendation: Heart Healthy Carb Modified Diet   Brief/Interim Summary: The patient is an overweight 73 year old Caucasian female with a past medical history significant for type 2 diabetes mellitus, chronic respiratory failure in setting of COPD on 2 L of supplemental oxygen at home, hypertension, hyperlipidemia, chronic kidney disease stage III with a baseline creatinine of 1.6-1.9 as well as other comorbidities who presented to [facility] hospital with a fever and a sepsis-like picture. She had a T-max at home noted to be over 101 with associated chills. She also had a nonproductive cough as well as rhinorrhea and associated mild shortness of breath which was new for her over the last day or so. She denies any recent travel or any Covid exposures and underwent CABG in February 2020 and has been on oxygen since then.   In the ED she had a T-max of 100.7 with O2 saturations 98 to 100% on her baseline 2 L of supplemental oxygen. Chest x-ray showed no evidence of acute cardiopulmonary process and Covid testing was ordered and was positive for SARS-CoV-2.   Patient appeared to have a sepsis-like picture but this is likely secondary to SIRS from her COVID-19 disease as it was mild.  Inflammatory markers are trended down and patient is feeling better and she  was treated with Remdesvir and IV steroids with Decadron.  Patient work with therapy and had no needs.  Today she was feeling well and her inflammatory markers slightly spiked but she is much improved and was deemed stable to be discharged and will need to follow-up with PCP.  Her pancytopenia is improving as well and she will need to follow-up with PCP, cardiology and nephrology within 1 week and I told her to isolate for at least at least 14 days and have her blood work repeated within 1 week continue to see how her inflammatory markers are trending as well as her blood count.  All her questions were answered to her satisfaction she was stable for discharge on 2 L supplemental oxygen.  Discharge Diagnoses:  Principal Problem:   Sepsis ([place]) Active Problems:   Atherosclerotic heart disease of native coronary artery without angina pectoris   Chronic kidney disease   GERD (gastroesophageal reflux disease)   Hypertension, benign   H/O TIA (transient ischemic attack) and stroke   Fever   Pancytopenia (HCC)  Sepsisruled Out;she has SIRS secondary to COVID-19 disease and COVID-19 infection -SIRS criteria met via presenting objective fever as well as tachypneafrom COVID-19 disease. -In the setting of presenting objective fever, chills, new onset cough, new onset shortness of breath, new onset rhinorrhea, there is a clinical index of suspicion for underlying COVID-19.Associated nasopharyngeal swab was obtained this evening, with result currently pending. Criteria not met for the patient's sepsis to be considered severe nature given the absence of any associated evidence of endorgan damage., Including nonelevated presenting lactic acid. -Of note, Admission chest x-rays  shows no evidence of acute cardiopulmonary process, including no evidence of infiltrate or edema.  -Presenting urinalysis shows insignificant pyuria in the context of the absence of urinary complaintsand does show trace  leukocytes, no bacteria, and 11-20 WBCs. -Repeat CXR yesterday AM showed "No evidence of active disease." -Patient also denies any associated headache, neck stiffness, rash,nausea, vomiting, diarrhea, or abdominal discomfort. -Rapid influenza evaluate negative. -COVID-19 nasopharyngeal swab was POSITIVE . -Continue to monitor for results blood culturesand is pending at this time -Continue with supportive care -Trend inflammatory markers;procalcitonin level was less than 0.10x2, triglycerides were 70, ESR was 46 -> 45 -> 35 -> 28 -> 38, and fibrinogen level was 596 -> 530 -> 432 -> 456 -> 411 -Inflammatory Marker Trend Recent Labs    05/18/19 0243 05/19/19 0355 05/20/19 0511  DDIMER 0.99* 0.72* 0.61*  FERRITIN 147 176 182  LDH 151 147 157  CRP <0.8 <0.8 1.3*   Lab Results  Component Value Date   SARSCOV2NAA POSITIVE (A) 05/15/2019   -Started the patient on dexamethasone 6 mg IV every 24 hours and and will change to a prolonged steroid taper given her slight increase in the inflammatory markers on the day of discharge -Continue fluticasone 1 spray in each nare daily, as needed albuterol inhaler 2 puffs every 6 hours as needed for wheezing or shortness breath, scheduled Combivent 1 puff every 6, as well as Dulera 2 puffs twice daily -We will hold her Incruse Ellipta -We will also giveRemdesivir 5-day treatment and she completed this today -Resumed home Lasix 40 mg po Daily  yesterday and will give a dose of IV Lasix prior to discharge -Continue Trend inflammatory markers and CBC and CMP within 1 week and repeat chest x-ray in 3 to 6 weeks.  Type 2DiabetesMellitus with Hyperglycemia -Does not require exogenous insulin as an outpatient, rather, the patient was on glipizide as well as Januvia at home.Okay to resume home medications at discharge -Presenting blood sugar per presenting CMP noted to be 154.  This morning blood sugar on CMP was 300 -Hold home Januvia and glipizide  during this hospitalization. -Ordered Accu-Cheks q. before meals and at bedtime with associated sliding scale insulin. -Continue to Monitor CBG's carefully; CBGs were elevated and ranged from 167-236 -Checked a hemoglobin A1c and was 8.9 -Started the patient on insulin glargine 15 units subcu nightly and increased to 25 units -Will increase SSI to Resistant  AC/HS and increase 3 units TIDwm to 6 units TIDwm -Continue to monitor blood sugars carefully and adjust insulin regimen as necessary -We will need close PCP follow-up to adjust medications  Chronic Hypoxic Respiratory failure in the setting of COPDand now COVID 19 Disease -Patient is on 2 L continuous supplemental oxygen at baseline, without any increase in baseline oxygen requirements presentation thus far. -Outpatient respiratory regimen include Symbicort, Spiriva, and as needed albuterol inhalerand have substituted as above  -Continue home respiratory regimen, with as needed titration of supplemental oxygen order to maintain oxygen saturations greater than or equal to 92%.  -Patient is doing well from a respiratory standpoint but complains of having a dry hacking cough but feel it is improving   EssentialHypertension -On Norvasc as an outpatient. -No evidence of hypotension thus far, however, in the setting of septic presentation, her antihypertensives were held but resumed Amlodipine yesterday  -Resumed Home Lasix given additional dose of IV Lasix  -BP was 136/53 -Continue to monitor blood pressures per protocol  Stage IIIChronicKidneyDisease -Associated baseline creatinine of 1.6-1.9. -Presenting labs reflect  serum creatinine with in this baseline range. -Gentle IVF Hydration with NS at 75 mL/hr is now stopped; patient BUN/creatinine went from 38/2.00 -> 41/2.04 -> 49/2.18 -> 50/1.60 -> 56/1.76 and resume home Lasix yesterday and given a dose of IV Lasix prior to discharge -Monitor strict I's and O's and daily  weights. -Attempt to avoid nephrotoxic agentsand Renally Dose Medications. -RepeatCMP in the morning.  GERD -OnOmeprazole as an outpatient and can continue this at discharge -Continue home [facility] Substitution of Pantoprazole  NormocyticAnemia -Likely anemia of chronic kidney disease -The patient's hemoglobin/hematocrit went from 11.0/34.5 and is now 9.9/31.0 -Likely also partly because of a dilutional drop from IV fluid hydration -Checked anemia panel this am and showed an iron level of 70, U IBC 198, TIBC of 268, saturation ratios of 26%, ferritin level of 129, folate level 36.0, and vitamin B12 levels 1274 -Continue to monitor for signs and symptoms of bleeding; currently no overt bleeding noted -Repeat CBC within 1 week  Thrombocytopenia, was worsening and now improving  -Patient platelet count on admission was 145,000 -> 122,000 -> 102,000 -> 93,000 -> 112,000 -Continue to monitor for signs and symptoms of bleeding: Currently no overt bleeding noted but has some arm bruising on the Right Arm -Repeat CBC within 1 week  Pancytopenia, improving -? Dilutional Drop vs. COVID vs. Medication induced -Will save Smear in AM -Patient's WBC went from 5.1 -> 3.0 -> 2.8 -> 3.5 -> 5.4 , Hb/Hct was 9.4/30.2 and improved to 9.9/31.0 and Platelet Count is now  112,000,000; patient does have lymphopenia consistent with Covid -Discussed with Dr. Burr Medico to review the chart and Dr. Burr Medico believes this is related to her COVID-19 disease and not secondary to Remdesivir -Continue to Monitor and Trend; Repeat CBC within 1 week  Elevated AST, improved  -Likely in the setting of COVID-19 disease; AST was 42 on admission and is now 23 -Continue to monitor and trend LFTs -If necessary will obtain a right upper quadrant ultrasound -Repeat CBC in a.m.  CAD with recent CABG -Continue withAspirin 81 mg p.o. daily along with atorvastatin 40 mg p.o. nightly -Also will continue with  metoprolol succinate 25 mg p.o. daily  Hyperlipidemia -Continue atorvastatin 40 minutes p.o. nightly and continue with ezetimibe 90m Daily.  Discharge Instructions Discharge Instructions    Call MD for:  difficulty breathing, headache or visual disturbances   Complete by: As directed    Call MD for:  extreme fatigue   Complete by: As directed    Call MD for:  hives   Complete by: As directed    Call MD for:  persistant dizziness or light-headedness   Complete by: As directed    Call MD for:  persistant nausea and vomiting   Complete by: As directed    Call MD for:  redness, tenderness, or signs of infection (pain, swelling, redness, odor or green/yellow discharge around incision site)   Complete by: As directed    Call MD for:  severe uncontrolled pain   Complete by: As directed    Call MD for:  temperature >100.4   Complete by: As directed    Diet - low sodium heart healthy   Complete by: As directed    Diet Carb Modified   Complete by: As directed    Discharge instructions   Complete by: As directed    You were cared for by a hospitalist during your hospital stay. If you have any questions about your discharge medications or the care you  received while you were in the hospital after you are discharged, you can call the unit and ask to speak with the hospitalist on call if the hospitalist that took care of you is not available. Once you are discharged, your primary care physician will handle any further medical issues. Please note that NO REFILLS for any discharge medications will be authorized once you are discharged, as it is imperative that you return to your primary care physician (or establish a relationship with a primary care physician if you do not have one) for your aftercare needs so that they can reassess your need for medications and monitor your lab values.  Follow up with PCP, Cardiology, and Nephrology. Please have blood work and inflammatory markers repeated within  1 week. Take all medications as prescribed. If symptoms change or worsen please return to the ED for evaluation   Infection Prevention Recommendations for Individuals Confirmed to have, or Being Evaluated for, 2019 Novel Coronavirus (COVID-19) Infection Who Receive Care at Home  Individuals who are confirmed to have, or are being evaluated for, COVID-19 should follow the prevention steps below until a healthcare provider or local or state health department says they can return to normal activities.  Stay home except to get medical care You should restrict activities outside your home, except for getting medical care. Do not go to work, school, or public areas, and do not use public transportation or taxis.  Call ahead before visiting your doctor Before your medical appointment, call the healthcare provider and tell them that you have, or are being evaluated for, COVID-19 infection. This will help the healthcare provider's office take steps to keep other people from getting infected. Ask your healthcare provider to call the local or state health department.  Monitor your symptoms Seek prompt medical attention if your illness is worsening (e.g., difficulty breathing). Before going to your medical appointment, call the healthcare provider and tell them that you have, or are being evaluated for, COVID-19 infection. Ask your healthcare provider to call the local or state health department.  Wear a facemask You should wear a facemask that covers your nose and mouth when you are in the same room with other people and when you visit a healthcare provider. People who live with or visit you should also wear a facemask while they are in the same room with you.  Separate yourself from other people in your home As much as possible, you should stay in a different room from other people in your home. Also, you should use a separate bathroom, if available.  Avoid sharing household items You should  not share dishes, drinking glasses, cups, eating utensils, towels, bedding, or other items with other people in your home. After using these items, you should wash them thoroughly with soap and water.  Cover your coughs and sneezes Cover your mouth and nose with a tissue when you cough or sneeze, or you can cough or sneeze into your sleeve. Throw used tissues in a lined trash can, and immediately wash your hands with soap and water for at least 20 seconds or use an alcohol-based hand rub.  Wash your [facility] your hands often and thoroughly with soap and water for at least 20 seconds. You can use an alcohol-based hand sanitizer if soap and water are not available and if your hands are not visibly dirty. Avoid touching your eyes, nose, and mouth with unwashed hands.   Prevention Steps for Caregivers and Household Members of Individuals Confirmed to  have, or Being Evaluated for, COVID-19 Infection Being Cared for in the Home  If you live with, or provide care at home for, a person confirmed to have, or being evaluated for, COVID-19 infection please follow these guidelines to prevent infection:  Follow healthcare provider's instructions Make sure that you understand and can help the patient follow any healthcare provider instructions for all care.  Provide for the patient's basic needs You should help the patient with basic needs in the home and provide support for getting groceries, prescriptions, and other personal needs.  Monitor the patient's symptoms If they are getting sicker, call his or her medical provider and tell them that the patient has, or is being evaluated for, COVID-19 infection. This will help the healthcare provider's office take steps to keep other people from getting infected. Ask the healthcare provider to call the local or state health department.  Limit the number of people who have contact with the patient If possible, have only one caregiver for the  patient. Other household members should stay in another home or place of residence. If this is not possible, they should stay in another room, or be separated from the patient as much as possible. Use a separate bathroom, if available. Restrict visitors who do not have an essential need to be in the home.  Keep older adults, very young children, and other sick people away from the patient Keep older adults, very young children, and those who have compromised immune systems or chronic health conditions away from the patient. This includes people with chronic heart, lung, or kidney conditions, diabetes, and cancer.  Ensure good ventilation Make sure that shared spaces in the home have good air flow, such as from an air conditioner or an opened window, weather permitting.  Wash your hands often Wash your hands often and thoroughly with soap and water for at least 20 seconds. You can use an alcohol based hand sanitizer if soap and water are not available and if your hands are not visibly dirty. Avoid touching your eyes, nose, and mouth with unwashed hands. Use disposable paper towels to dry your hands. If not available, use dedicated cloth towels and replace them when they become wet.  Wear a facemask and gloves Wear a disposable facemask at all times in the room and gloves when you touch or have contact with the patient's blood, body fluids, and/or secretions or excretions, such as sweat, saliva, sputum, nasal mucus, vomit, urine, or feces.  Ensure the mask fits over your nose and mouth tightly, and do not touch it during use. Throw out disposable facemasks and gloves after using them. Do not reuse. Wash your hands immediately after removing your facemask and gloves. If your personal clothing becomes contaminated, carefully remove clothing and launder. Wash your hands after handling contaminated clothing. Place all used disposable facemasks, gloves, and other waste in a lined container before  disposing them with other household waste. Remove gloves and wash your hands immediately after handling these items.  Do not share dishes, glasses, or other household items with the patient Avoid sharing household items. You should not share dishes, drinking glasses, cups, eating utensils, towels, bedding, or other items with a patient who is confirmed to have, or being evaluated for, COVID-19 infection. After the person uses these items, you should wash them thoroughly with soap and water.  Wash laundry thoroughly Immediately remove and wash clothes or bedding that have blood, body fluids, and/or secretions or excretions, such as sweat, saliva, sputum,  nasal mucus, vomit, urine, or feces, on them. Wear gloves when handling laundry from the patient. Read and follow directions on labels of laundry or clothing items and detergent. In general, wash and dry with the warmest temperatures recommended on the label.  Clean all areas the individual has used often Clean all touchable surfaces, such as counters, tabletops, doorknobs, bathroom fixtures, toilets, phones, keyboards, tablets, and bedside tables, every day. Also, clean any surfaces that may have blood, body fluids, and/or secretions or excretions on them. Wear gloves when cleaning surfaces the patient has come in contact with. Use a diluted bleach solution (e.g., dilute bleach with 1 part bleach and 10 parts water) or a household disinfectant with a label that says EPA-registered for coronaviruses. To make a bleach solution at home, add 1 tablespoon of bleach to 1 quart (4 cups) of water. For a larger supply, add  cup of bleach to 1 gallon (16 cups) of water. Read labels of cleaning products and follow recommendations provided on product labels. Labels contain instructions for safe and effective use of the cleaning product including precautions you should take when applying the product, such as wearing gloves or eye protection and making sure you  have good ventilation during use of the product. Remove gloves and wash hands immediately after cleaning.  Monitor yourself for signs and symptoms of illness Caregivers and household members are considered close contacts, should monitor their health, and will be asked to limit movement outside of the home to the extent possible. Follow the monitoring steps for close contacts listed on the symptom monitoring form.   ? If you have additional questions, contact your local health department or call the epidemiologist on call at [phone] (available 24/7). ? This guidance is subject to change. For the most up-to-date guidance from [facility], please refer to their website: [email or web address]   Increase activity slowly   Complete by: As directed      Allergies as of 05/20/2019      Reactions   Metformin And Related Diarrhea   Sulfur Hives      Medication List    STOP taking these medications   benzonatate 100 MG capsule Commonly known as: TESSALON   predniSONE 10 MG tablet Commonly known as: DELTASONE Replaced by: predniSONE 10 MG (21) Tbpk tablet     TAKE these medications   albuterol 108 (90 Base) MCG/ACT inhaler Commonly known as: VENTOLIN HFA Inhale 2 puffs into the lungs every 6 (six) hours as needed for wheezing or shortness of breath.   amLODipine 10 MG tablet Commonly known as: NORVASC Take 10 mg by mouth daily.   aspirin EC 81 MG tablet Take 81 mg by mouth daily.   atorvastatin 40 MG tablet Commonly known as: LIPITOR Take 40 mg by mouth at bedtime.   chlorpheniramine-HYDROcodone 10-8 MG/5ML Suer Commonly known as: TUSSIONEX Take 5 mLs by mouth every 12 (twelve) hours as needed for cough. What changed: when to take this   ezetimibe 10 MG tablet Commonly known as: ZETIA Take 10 mg by mouth daily.   fluticasone 50 MCG/ACT nasal spray Commonly known as: FLONASE Place 1 spray into both nostrils daily.    furosemide 20 MG tablet Commonly known as: LASIX Take 40 mg by mouth daily.   glipiZIDE 10 MG tablet Commonly known as: GLUCOTROL Take 10 mg by mouth 2 (two) times daily before a meal.   Januvia 50 MG tablet Generic drug: sitaGLIPtin Take 50 mg by mouth daily.   metoprolol succinate 25 MG  24 hr tablet Commonly known as: TOPROL-XL Take 25 mg by mouth daily.   Multi-Vitamins Tabs Take 1 tablet by mouth daily.   nitroGLYCERIN 0.4 MG SL tablet Commonly known as: NITROSTAT PLACE 1 TABLET UNDER THE TONGUE EVERY 5 MINUTES AS NEEDED FOR CHEST PAIN   omeprazole 20 MG capsule Commonly known as: PRILOSEC Take 1 capsule by mouth daily.   predniSONE 10 MG (21) Tbpk tablet Commonly known as: STERAPRED UNI-PAK 21 TAB Take 60 (6 Tablets) mg daily by mouth for 2 days, followed by 50 mg (5 Tablets) daily by mouth for 2 days, then 40 (4 Tablets) mg daily for 2 days, then 30 (3 Tablets) mg daily for 2 days then 20 (2 Tablets) mg for 2 days then 10 mg for 2 days and stop Replaces: predniSONE 10 MG tablet   Spiriva HandiHaler 18 MCG inhalation capsule Generic drug: tiotropium Place 18 mcg into inhaler and inhale daily.   Symbicort 160-4.5 MCG/ACT inhaler Generic drug: budesonide-formoterol Inhale 2 puffs into the lungs 2 (two) times daily.   Vitamin D3 25 MCG (1000 UT) Caps Take 1 capsule by mouth daily.       Allergies  Allergen Reactions  . Metformin And Related Diarrhea  . Sulfur Hives   Consultations:  Discussed case with medical oncology Dr. Burr Medico  Procedures/Studies: Dg Chest Port 1 View  Result Date: 05/20/2019 CLINICAL DATA:  Shortness of breath EXAM: PORTABLE CHEST 1 VIEW COMPARISON:  Two days ago FINDINGS: Normal heart size. CABG and coronary stenting. Hazy opacification at the peripheral left base, likely mild scarring when correlated with June 29 20 PA and lateral study. Postoperative right breast/axilla. IMPRESSION: No acute finding.  Pleural based scarring on the  left. Electronically Signed   By: Monte Fantasia M.D.   On: 05/20/2019 07:29   Dg Chest Port 1 View  Result Date: 05/18/2019 CLINICAL DATA:  Shortness of breath EXAM: PORTABLE CHEST 1 VIEW COMPARISON:  Yesterday FINDINGS: Normal heart size and mediastinal contours. Prior CABG and left subclavian region stenting. Mild interstitial coarsening similar to prior. There is no edema, consolidation, effusion, or pneumothorax. Postoperative right breast. Artifact from EKG leads. IMPRESSION: No evidence of active disease Electronically Signed   By: Monte Fantasia M.D.   On: 05/18/2019 07:05   Dg Chest Port 1 View  Result Date: 05/17/2019 CLINICAL DATA:  Shortness of breath. EXAM: PORTABLE CHEST 1 VIEW COMPARISON:  05/15/2019 FINDINGS: 0434 hours. The lungs are clear without focal pneumonia, edema, pneumothorax or pleural effusion. The cardiopericardial silhouette is within normal limits for size. The visualized bony structures of the thorax are intact. Telemetry leads overlie the chest. IMPRESSION: No active disease. Electronically Signed   By: Misty Stanley M.D.   On: 05/17/2019 07:30   Dg Chest Port 1 View  Result Date: 05/15/2019 CLINICAL DATA:  Fever and shortness of breath. EXAM: PORTABLE CHEST 1 VIEW COMPARISON:  12/23/2018 FINDINGS: Post median sternotomy.The cardiomediastinal contours are normal. Atherosclerosis of the aortic arch. Vascular stent in the region of the left upper mediastinum. Coronary stent visualized. Chronic hyperinflation. Pulmonary vasculature is normal. No consolidation, pleural effusion, or pneumothorax. No acute osseous abnormalities are seen. Surgical clips project over the right chest wall. IMPRESSION: 1. No acute abnormality. 2. Chronic hyperinflation. 3.  Aortic Atherosclerosis (ICD10-I70.0). Electronically Signed   By: Keith Rake M.D.   On: 05/15/2019 23:07    Subjective: Seen And examined at bedside she is doing well and denies any respiratory complaints.  No  nausea or vomiting.  Had a slight cough.  Denies any dyspnea and ambulated without issues.  Thinks her leg swelling is also improved.  She is stable for discharge and will need to follow-up with PCP, nephrology and cardiology in outpatient setting and if necessary follow-up with medical oncology and she is understandable and agreeable with the plan of care and to go home and self isolate for at least 14 days.  Discharge Exam: Vitals:   05/20/19 0514 05/20/19 1359  BP: (!) 149/54 (!) 136/53  Pulse: 70 62  Resp: 20 20  Temp: 97.7 F (36.5 C) 98.5 F (36.9 C)  SpO2: 100% 99%   Vitals:   05/19/19 1323 05/19/19 2054 05/20/19 0514 05/20/19 1359  BP: (!) 123/57 (!) 147/52 (!) 149/54 (!) 136/53  Pulse: (!) 59 62 70 62  Resp: 18 18 20 20   Temp: 98.2 F (36.8 C) 98.4 F (36.9 C) 97.7 F (36.5 C) 98.5 F (36.9 C)  TempSrc: Oral Oral Oral Oral  SpO2: 100% 100% 100% 99%  Weight:   72.2 kg   Height:       General: Pt is alert, awake, not in acute distress Cardiovascular: RRR, S1/S2 +, no rubs, no gallops Respiratory: Diminished bilaterally, no wheezing, no rhonchi; wearing supplemental oxygen via nasal cannula Abdominal: Soft, NT, distended secondary body habitus, bowel sounds + Extremities: Trace edema, no cyanosis  The results of significant diagnostics from this hospitalization (including imaging, microbiology, ancillary and laboratory) are listed below for reference.    Microbiology: Recent Results (from the past 240 hour(s))  SARS CORONAVIRUS 2 (TAT 6-24 HRS) Nasopharyngeal Nasopharyngeal Swab     Status: Abnormal   Collection Time: 05/15/19 10:42 PM   Specimen: Nasopharyngeal Swab  Result Value Ref Range Status   SARS Coronavirus 2 POSITIVE (A) NEGATIVE Final    Comment: RESULT CALLED TO, READ BACK BY AND VERIFIED WITH: Marisa Hua RN 9:40 05/16/19 ([email or web address]) (NOTE) SARS-CoV-2 target nucleic acids are DETECTED. The SARS-CoV-2 RNA is generally detectable in upper and  lower respiratory specimens during the acute phase of infection. Positive results are indicative of active infection with SARS-CoV-2. Clinical  correlation with patient history and other diagnostic information is necessary to determine patient infection status. Positive results do  not rule out bacterial infection or co-infection with other viruses. The expected result is Negative. Fact Sheet for Patients: SugarRoll.be Fact Sheet for Healthcare Providers: https://www.woods-mathews.com/ This test is not yet approved or cleared by the [place] FDA and  has been authorized for detection and/or diagnosis of SARS-CoV-2 by FDA under an Emergency Use Authorization (EUA). This EUA will remain  in effect (meaning this test can be used) for  the duration of the COVID-19 declaration under Section 564(b)(1) of the Act, 21 U.S.C. section 360bbb-3(b)(1), unless the authorization is terminated or revoked sooner. Performed at [facility], [place] [address], [place], [place] [address]   Blood Culture (routine x 2)     Status: None (Preliminary result)   Collection Time: 05/15/19 10:42 PM   Specimen: BLOOD  Result Value Ref Range Status   Specimen Description   Final    BLOOD LEFT ANTECUBITAL Performed at [facility] [address] [name], [place] [address]    Special Requests   Final    BOTTLES DRAWN AEROBIC AND ANAEROBIC Blood Culture adequate volume Performed at [facility] [address] [place], [place] [address]    Culture   Final    NO GROWTH 4 DAYS Performed at [facility], [place] [address], [place], [place] [address]    Report Status PENDING  Incomplete  Blood Culture (routine x 2)     Status: None (Preliminary result)   Collection Time: 05/15/19 10:47 PM   Specimen: BLOOD LEFT FOREARM  Result Value Ref Range Status   Specimen Description   Final    BLOOD LEFT FOREARM Performed at  [facility], [place] [address], [place], [place] [address]    Special Requests   Final    BOTTLES DRAWN AEROBIC AND ANAEROBIC Blood Culture adequate volume Performed at [facility] [address] [place], [place] [address]    Culture   Final    NO GROWTH 4 DAYS Performed at [facility], [place] [address], [place], [place] [address]    Report Status PENDING  Incomplete    Labs: BNP (last 3 results) No results for input(s): BNP in the last 8760 hours. Basic Metabolic Panel: Recent Labs  Lab 05/16/19 0805 05/17/19 0426 05/18/19 0243 05/19/19 0355 05/20/19 0511  NA 139 140 138 140 138  K 3.7 3.8 4.5 4.7 4.5  CL 100 106 105 108 104  CO2 28 23 22 23 24   GLUCOSE 90 219* 300* 298* 270*  BUN 38* 41* 49* 50* 56*  CREATININE 2.00* 2.04* 2.18* 1.60* 1.76*  CALCIUM 9.2 9.1 9.1 9.1 9.1  MG 2.3 2.2 2.3 2.3 2.3  PHOS 4.2 4.4 4.6 4.0 4.0   Liver Function Tests: Recent Labs  Lab 05/16/19 0805 05/17/19 0426 05/18/19 0243 05/19/19 0355 05/20/19 0511  AST 42* 33 27 22 23   ALT 31 31 29 25 27   ALKPHOS 84 76 67 66 67  BILITOT 0.7 0.3 0.2* 0.3 0.3  PROT 7.3 6.4* 6.3* 6.2* 6.3*  ALBUMIN 3.9 3.4* 3.3* 3.4* 3.4*   No results for input(s): LIPASE, AMYLASE in the last 168 hours. No results for input(s): AMMONIA in the last 168 hours. CBC: Recent Labs  Lab 05/16/19 0805 05/17/19 0426 05/18/19 0243 05/19/19 0355 05/20/19 0511  WBC 4.1 3.0* 2.8* 3.5* 5.4  NEUTROABS 2.3 2.1 2.0 2.6 3.9  HGB 10.7* 9.6* 9.4* 9.8* 9.9*  HCT 33.7* 30.6* 30.2* 31.8* 31.0*  MCV 94.1 94.7 95.6 96.1 93.9  PLT 143* 122* 102* 93* 112*   Cardiac Enzymes: No results for input(s): CKTOTAL, CKMB, CKMBINDEX, TROPONINI in the last 168 hours. BNP: Invalid input(s): POCBNP CBG: Recent Labs  Lab 05/19/19 1142 05/19/19 1645 05/19/19 2051 05/20/19 0742 05/20/19 1220  GLUCAP 251* 176* 236* 223* 167*   D-Dimer Recent Labs    05/19/19 0355 05/20/19 0511  DDIMER 0.72* 0.61*    Hgb A1c No results for input(s): HGBA1C in the last 72 hours. Lipid Profile No results for input(s): CHOL, HDL, LDLCALC, TRIG, CHOLHDL, LDLDIRECT in the last 72 hours. Thyroid function studies No results for input(s): TSH, T4TOTAL, T3FREE, THYROIDAB in the last 72 hours.  Invalid input(s): FREET3 Anemia work up Recent Labs    05/19/19 0355 05/20/19 0511  FERRITIN 176 182   Urinalysis    Component Value Date/Time   COLORURINE YELLOW 05/16/2019 0158   APPEARANCEUR CLEAR 05/16/2019 0158   LABSPEC 1.010 05/16/2019 0158   PHURINE 6.0 05/16/2019 0158   GLUCOSEU NEGATIVE 05/16/2019 0158   HGBUR NEGATIVE 05/16/2019 0158   BILIRUBINUR NEGATIVE 05/16/2019 0158   KETONESUR NEGATIVE 05/16/2019 0158   PROTEINUR 30 (A) 05/16/2019 0158   NITRITE NEGATIVE 05/16/2019 0158   LEUKOCYTESUR TRACE (A) 05/16/2019 0158   Sepsis Labs  Invalid input(s): PROCALCITONIN,  WBC,  LACTICIDVEN Microbiology Recent Results (from the past 240 hour(s))  SARS CORONAVIRUS 2 (TAT 6-24 HRS) Nasopharyngeal Nasopharyngeal Swab     Status: Abnormal   Collection Time: 05/15/19 10:42 PM   Specimen: Nasopharyngeal Swab  Result Value Ref Range Status   SARS Coronavirus 2 POSITIVE (A) NEGATIVE Final    Comment: RESULT CALLED TO, READ BACK BY AND VERIFIED WITH: Marisa Hua RN 9:40 05/16/19 ([email or web address]) (NOTE) SARS-CoV-2 target nucleic acids are DETECTED. The SARS-CoV-2 RNA is generally detectable in upper and lower respiratory specimens during the acute phase of infection. Positive results are indicative of active infection with SARS-CoV-2. Clinical  correlation with patient history and other diagnostic information is necessary to determine patient infection status. Positive results do  not rule out bacterial infection or co-infection with other viruses. The expected result is Negative. Fact Sheet for Patients: SugarRoll.be Fact Sheet for Healthcare  Providers: https://www.woods-mathews.com/ This test is not yet approved or cleared by the [place] FDA and  has been authorized for detection and/or diagnosis of SARS-CoV-2 by FDA under an Emergency Use Authorization (EUA). This EUA will remain  in effect (meaning this test can be used) for  the duration of the COVID-19 declaration under Section 564(b)(1) of the Act, 21 U.S.C. section 360bbb-3(b)(1), unless the authorization is terminated or revoked sooner. Performed at [facility], [place] [address] [place], [place] [address]   Blood Culture (routine x 2)     Status: None (Preliminary result)   Collection Time: 05/15/19 10:42 PM   Specimen: BLOOD  Result Value Ref Range Status   Specimen Description   Final    BLOOD LEFT ANTECUBITAL Performed at [facility] [address], [place], [place] [address]    Special Requests   Final    BOTTLES DRAWN AEROBIC AND ANAEROBIC Blood Culture adequate volume Performed at [facility] [address], [place], [place] [address]    Culture   Final    NO GROWTH 4 DAYS Performed at [facility], [place] [address], [place], [place] [address]    Report Status PENDING  Incomplete  Blood Culture (routine x 2)     Status: None (Preliminary result)   Collection Time: 05/15/19 10:47 PM   Specimen: BLOOD LEFT FOREARM  Result Value Ref Range Status   Specimen Description   Final    BLOOD LEFT FOREARM Performed at [facility], [place] [address] [place], [place] [address]    Special Requests   Final    BOTTLES DRAWN AEROBIC AND ANAEROBIC Blood Culture adequate volume Performed at [facility] [address], [place], [place] [address]    Culture   Final    NO GROWTH 4 DAYS Performed at [facility], [place] [address], [place],  [address]    Report Status PENDING  Incomplete   Time coordinating discharge: 35 minutes  SIGNED:  Kerney Elbe,  DO [facility] 05/20/2019, 10:41 PM Pager is on [place]  If 7PM-7AM, please contact night-coverage [email or web address] Password [email or web address]

## 2019-05-21 LAB — CULTURE, BLOOD (ROUTINE X 2)
Culture: NO GROWTH
Culture: NO GROWTH
Special Requests: ADEQUATE
Special Requests: ADEQUATE

## 2019-05-27 ENCOUNTER — Encounter: Payer: Self-pay | Admitting: *Deleted

## 2019-05-27 ENCOUNTER — Telehealth: Payer: Self-pay | Admitting: *Deleted

## 2019-05-27 DIAGNOSIS — Z951 Presence of aortocoronary bypass graft: Secondary | ICD-10-CM

## 2019-05-27 NOTE — Telephone Encounter (Signed)
Called to check on patient. She was discharged from hospital for COVID-19 on 11/24.  She is under a 14 day quarantine. She cannot return to rehab until 12/10.  She is still having symptoms.  She only has three visits left.  She will decide if she wants to return to finish.  She is planning to do pulmonary rehab now as well.

## 2019-05-28 ENCOUNTER — Encounter: Payer: Self-pay | Admitting: *Deleted

## 2019-05-28 DIAGNOSIS — Z951 Presence of aortocoronary bypass graft: Secondary | ICD-10-CM

## 2019-05-28 NOTE — Progress Notes (Signed)
Cardiac Individual Treatment Plan  Patient Details  Name: Andrea [name] MRN: [id number] Date of Birth: [birth year] Referring Provider:     Cardiac Rehab from 01/28/2019 in [facility] Cardiac and Pulmonary Rehab  Referring Provider  Chong Sicilian MD      Initial Encounter Date:    Cardiac Rehab from 01/28/2019 in [facility] Cardiac and Pulmonary Rehab  Date  01/28/19      Visit Diagnosis: S/P CABG x 3  Patient's Home Medications on Admission:  Current Outpatient Medications:  .  albuterol (PROVENTIL HFA;VENTOLIN HFA) 108 (90 Base) MCG/ACT inhaler, Inhale 2 puffs into the lungs every 6 (six) hours as needed for wheezing or shortness of breath., Disp: 1 Inhaler, Rfl: 0 .  amLODipine (NORVASC) 10 MG tablet, Take 10 mg by mouth daily. , Disp: , Rfl:  .  aspirin EC 81 MG tablet, Take 81 mg by mouth daily., Disp: , Rfl:  .  atorvastatin (LIPITOR) 40 MG tablet, Take 40 mg by mouth at bedtime., Disp: , Rfl: 3 .  budesonide-formoterol (SYMBICORT) 160-4.5 MCG/ACT inhaler, Inhale 2 puffs into the lungs 2 (two) times daily. , Disp: , Rfl:  .  chlorpheniramine-HYDROcodone (TUSSIONEX) 10-8 MG/5ML SUER, Take 5 mLs by mouth every 12 (twelve) hours as needed for cough., Disp: 115 mL, Rfl: 0 .  Cholecalciferol (VITAMIN D3) 1000 units CAPS, Take 1 capsule by mouth daily., Disp: , Rfl:  .  ezetimibe (ZETIA) 10 MG tablet, Take 10 mg by mouth daily. , Disp: , Rfl:  .  fluticasone (FLONASE) 50 MCG/ACT nasal spray, Place 1 spray into both nostrils daily. , Disp: , Rfl:  .  furosemide (LASIX) 20 MG tablet, Take 40 mg by mouth daily. , Disp: , Rfl:  .  glipiZIDE (GLUCOTROL) 10 MG tablet, Take 10 mg by mouth 2 (two) times daily before a meal. , Disp: , Rfl:  .  JANUVIA 50 MG tablet, Take 50 mg by mouth daily. , Disp: , Rfl:  .  metoprolol succinate (TOPROL-XL) 25 MG 24 hr tablet, Take 25 mg by mouth daily. , Disp: , Rfl:  .  Multiple Vitamin (MULTI-VITAMINS) TABS, Take 1 tablet by mouth daily., Disp: , Rfl:  .   nitroGLYCERIN (NITROSTAT) 0.4 MG SL tablet, PLACE 1 TABLET UNDER THE TONGUE EVERY 5 MINUTES AS NEEDED FOR CHEST PAIN, Disp: , Rfl:  .  omeprazole (PRILOSEC) 20 MG capsule, Take 1 capsule by mouth daily., Disp: , Rfl:  .  predniSONE (STERAPRED UNI-PAK 21 TAB) 10 MG (21) TBPK tablet, Take 60 (6 Tablets) mg daily by mouth for 2 days, followed by 50 mg (5 Tablets) daily by mouth for 2 days, then 40 (4 Tablets) mg daily for 2 days, then 30 (3 Tablets) mg daily for 2 days then 20 (2 Tablets) mg for 2 days then 10 mg for 2 days and stop, Disp: 42 tablet, Rfl: 0 .  tiotropium (SPIRIVA HANDIHALER) 18 MCG inhalation capsule, Place 18 mcg into inhaler and inhale daily. , Disp: , Rfl:   Past Medical History: Past Medical History:  Diagnosis Date  . COPD (chronic obstructive pulmonary disease) ([place])   . Diabetes mellitus without complication ([place])   . Heart attack ([place])   . Renal disorder     Tobacco Use: Social History   Tobacco Use  Smoking Status Never Smoker  Smokeless Tobacco Never Used    Labs: Recent Review Scientist, physiological    Labs for ITP Cardiac and Pulmonary Rehab Latest Ref Rng & Units 09/21/2017 11/16/2017 05/15/2019 05/16/2019  Trlycerides <150 mg/dL - - 110 -   Hemoglobin A1c 4.8 - 5.6 % 7.2(H) 7.3(H) - 8.9(H)       Exercise Target Goals: Exercise Program Goal: Individual exercise prescription set using results from initial 6 min walk test and THRR while considering  patient's activity barriers and safety.   Exercise Prescription Goal: Initial exercise prescription builds to 30-45 minutes a day of aerobic activity, 2-3 days per week.  Home exercise guidelines will be given to patient during program as part of exercise prescription that the participant will acknowledge.  Activity Barriers & Risk Stratification: Activity Barriers & Cardiac Risk Stratification - 12/17/18 1006      Activity Barriers & Cardiac Risk Stratification   Activity Barriers  Balance Concerns;History of  Falls;Assistive Device;Other (comment);Deconditioning;Muscular Weakness    Comments  Currently wearing brace for foot after concentator fell on it, using walker    Cardiac Risk Stratification  High       6 Minute Walk: 6 Minute Walk    Row Name 01/28/19 1557 05/01/19 1559       6 Minute Walk   Phase  Initial  Discharge    Distance  660 feet  1100 feet    Distance % Change  -  67 %    Distance Feet Change  -  440 ft    Walk Time  4.48 minutes  6 minutes    # of Rest Breaks  1  0    MPH  1.2  2.08    METS  2  2.54    RPE  11  15    Perceived Dyspnea   1  3    VO2 Peak  7  8.9    Symptoms  Yes (comment)  Yes (comment)    Comments  Patient reports taking a break due to being tired and thirsty from wearing a mask.  leg fatigue    Resting HR  104 bpm  81 bpm    Resting BP  118/58  116/52    Resting Oxygen Saturation   92 %  95 %    Exercise Oxygen Saturation  during 6 min walk  97 %  91 %    Max Ex. HR  127 bpm  121 bpm    Max Ex. BP  146/70  132/50    2 Minute Post BP  120/58  -       Oxygen Initial Assessment: Oxygen Initial Assessment - 12/17/18 1009      Home Oxygen   Home Oxygen Device  Home Concentrator;E-Tanks;Portable Concentrator    Sleep Oxygen Prescription  Continuous    Liters per minute  2    Home Exercise Oxygen Prescription  Pulsed    Liters per minute  2    Home at Rest Exercise Oxygen Prescription  None    Compliance with Home Oxygen Use  Yes      Program Oxygen Prescription   Program Oxygen Prescription  Portable Concentrator;Pulsed    Liters per minute  2      Intervention   Short Term Goals  To learn and exhibit compliance with exercise, home and travel O2 prescription;To learn and understand importance of monitoring SPO2 with pulse oximeter and demonstrate accurate use of the pulse oximeter.;To learn and understand importance of maintaining oxygen saturations>88%;To learn and demonstrate proper pursed lip breathing techniques or other breathing  techniques.;To learn and demonstrate proper use of respiratory medications    Long  Term Goals  Exhibits compliance with  exercise, home and travel O2 prescription;Verbalizes importance of monitoring SPO2 with pulse oximeter and return demonstration;Maintenance of O2 saturations>88%;Compliance with respiratory medication;Exhibits proper breathing techniques, such as pursed lip breathing or other method taught during program session;Demonstrates proper use of MDI's       Oxygen Re-Evaluation: Oxygen Re-Evaluation    Row Name 01/29/19 1700 03/26/19 1534           Program Oxygen Prescription   Program Oxygen Prescription  None;E-Tanks;Continuous  E-Tanks      Liters per minute  2  1      Comments  -  patient has been using 1 liter and has been trying to come off oxygen if she can.        Home Oxygen   Home Oxygen Device  Portable Concentrator;Home Concentrator;E-Tanks  Portable Concentrator;Home Concentrator;E-Tanks      Sleep Oxygen Prescription  Continuous  Continuous      Liters per minute  2  2      Home Exercise Oxygen Prescription  Continuous  Continuous      Liters per minute  2  2      Home at Rest Exercise Oxygen Prescription  None  None no oxygen at rest      Compliance with Home Oxygen Use  Yes  Yes        Goals/Expected Outcomes   Short Term Goals  To learn and understand importance of monitoring SPO2 with pulse oximeter and demonstrate accurate use of the pulse oximeter.;To learn and understand importance of maintaining oxygen saturations>88%;To learn and demonstrate proper pursed lip breathing techniques or other breathing techniques.;To learn and exhibit compliance with exercise, home and travel O2 prescription  To learn and demonstrate proper use of respiratory medications;To learn and demonstrate proper pursed lip breathing techniques or other breathing techniques.;To learn and understand importance of maintaining oxygen saturations>88%;To learn and understand importance  of monitoring SPO2 with pulse oximeter and demonstrate accurate use of the pulse oximeter.;To learn and exhibit compliance with exercise, home and travel O2 prescription      Long  Term Goals  Compliance with respiratory medication;Exhibits proper breathing techniques, such as pursed lip breathing or other method taught during program session;Maintenance of O2 saturations>88%;Verbalizes importance of monitoring SPO2 with pulse oximeter and return demonstration;Exhibits compliance with exercise, home and travel O2 prescription  Demonstrates proper use of MDI's;Compliance with respiratory medication;Exhibits proper breathing techniques, such as pursed lip breathing or other method taught during program session;Maintenance of O2 saturations>88%;Verbalizes importance of monitoring SPO2 with pulse oximeter and return demonstration;Exhibits compliance with exercise, home and travel O2 prescription      Comments  Reviewed PLB technique with pt.  Talked about how it work and it's important to maintaining his exercise saturations.  Patient has a pulse ox at home and checks her oxygen when she is short of breath. She is going to try to check her oxygen when she wakes up. She sleeps with it but sometimes it comes off. She states she is taking her medications regularly and does not need and help or information on them.      Goals/Expected Outcomes  Short: Become more profiecient at using PLB.   Long: Become independent at using PLB.  Short: monitor oxygen when she wakes up. Long: maintain oxygen saturation at home independently.         Oxygen Discharge (Final Oxygen Re-Evaluation): Oxygen Re-Evaluation - 03/26/19 1534      Program Oxygen Prescription   Program Oxygen Prescription  E-Tanks  Liters per minute  1    Comments  patient has been using 1 liter and has been trying to come off oxygen if she can.      Home Oxygen   Home Oxygen Device  Portable Concentrator;Home Concentrator;E-Tanks    Sleep Oxygen  Prescription  Continuous    Liters per minute  2    Home Exercise Oxygen Prescription  Continuous    Liters per minute  2    Home at Rest Exercise Oxygen Prescription  None   no oxygen at rest   Compliance with Home Oxygen Use  Yes      Goals/Expected Outcomes   Short Term Goals  To learn and demonstrate proper use of respiratory medications;To learn and demonstrate proper pursed lip breathing techniques or other breathing techniques.;To learn and understand importance of maintaining oxygen saturations>88%;To learn and understand importance of monitoring SPO2 with pulse oximeter and demonstrate accurate use of the pulse oximeter.;To learn and exhibit compliance with exercise, home and travel O2 prescription    Long  Term Goals  Demonstrates proper use of MDI's;Compliance with respiratory medication;Exhibits proper breathing techniques, such as pursed lip breathing or other method taught during program session;Maintenance of O2 saturations>88%;Verbalizes importance of monitoring SPO2 with pulse oximeter and return demonstration;Exhibits compliance with exercise, home and travel O2 prescription    Comments  Patient has a pulse ox at home and checks her oxygen when she is short of breath. She is going to try to check her oxygen when she wakes up. She sleeps with it but sometimes it comes off. She states she is taking her medications regularly and does not need and help or information on them.    Goals/Expected Outcomes  Short: monitor oxygen when she wakes up. Long: maintain oxygen saturation at home independently.       Initial Exercise Prescription: Initial Exercise Prescription - 01/28/19 1600      Date of Initial Exercise RX and Referring Provider   Date  01/28/19    Referring Provider  Chong Sicilian MD      Oxygen   Oxygen  Continuous    Liters  2      Treadmill   MPH  1.3    Grade  0    Minutes  15    METs  1.2      NuStep   Level  2    SPM  60    Minutes  15    METs  2       Arm Ergometer   Level  1    Watts  15    RPM  10    Minutes  15    METs  1.2      Biostep-RELP   Level  2    SPM  50    Minutes  15    METs  2      Prescription Details   Frequency (times per week)  3    Duration  Progress to 30 minutes of continuous aerobic without signs/symptoms of physical distress      Intensity   THRR 40-80% of Max Heartrate  122-139    Ratings of Perceived Exertion  11-15    Perceived Dyspnea  0-4      Progression   Progression  Continue progressive overload as per policy without signs/symptoms or physical distress.      Resistance Training   Weight  3    Reps  10-15       Perform  Capillary Blood Glucose checks as needed.  Exercise Prescription Changes: Exercise Prescription Changes    Row Name 01/28/19 1600 02/14/19 1000 02/25/19 1400 03/13/19 1500 03/25/19 1100     Response to Exercise   Blood Pressure (Admit)  118/58  128/60  132/60  138/60  122/56   Blood Pressure (Exercise)  146/70  140/60  164/60  142/60  152/60   Blood Pressure (Exit)  120/58  130/58  130/60  128/58  130/58   Heart Rate (Admit)  104 bpm  88 bpm  65 bpm  85 bpm  94 bpm   Heart Rate (Exercise)  127 bpm  119 bpm  130 bpm  128 bpm  112 bpm   Heart Rate (Exit)  89 bpm  86 bpm  99 bpm  109 bpm  103 bpm   Oxygen Saturation (Admit)  92 %  -  -  -  -   Oxygen Saturation (Exercise)  97 %  -  -  -  -   Oxygen Saturation (Exit)  95 %  -  -  -  -   Rating of Perceived Exertion (Exercise)  11  15  11  13  12    Perceived Dyspnea (Exercise)  1  -  -  -  0   Symptoms  - Patient stopped to rest because she felt tired and thirsty.  -  -  -  none   Duration  Progress to 30 minutes of  aerobic without signs/symptoms of physical distress  Continue with 30 min of aerobic exercise without signs/symptoms of physical distress.  Continue with 30 min of aerobic exercise without signs/symptoms of physical distress.  Continue with 30 min of aerobic exercise without signs/symptoms of physical  distress.  Continue with 30 min of aerobic exercise without signs/symptoms of physical distress.   Intensity  THRR New  THRR unchanged  THRR unchanged  THRR unchanged  THRR unchanged     Progression   Progression  Continue to progress workloads to maintain intensity without signs/symptoms of physical distress.  Continue to progress workloads to maintain intensity without signs/symptoms of physical distress.  Continue to progress workloads to maintain intensity without signs/symptoms of physical distress.  Continue to progress workloads to maintain intensity without signs/symptoms of physical distress.  Continue to progress workloads to maintain intensity without signs/symptoms of physical distress.   Average METs  -  -  2.4  2.6  2.16     Resistance Training   Training Prescription  -  Yes  Yes  Yes  Yes   Weight  -  3 lb  3 lb  3 lb  3 lbs   Reps  -  10-15  10-15  10-15  10-15     Interval Training   Interval Training  -  -  -  -  No     Oxygen   Oxygen  -  Continuous  Continuous  Continuous  Continuous   Liters  -  2  2  1  1      Treadmill   MPH  -  -  -  1  1.5   Grade  -  -  -  0  0   Minutes  -  -  -  15  15   METs  -  -  -  1.77  2.15     Recumbant Bike   Level  -  2  2  2  2    RPM  -  -  -  60  -   Watts  -  -  -  25  -   Minutes  -  15  15  15  15    METs  -  3.11  3.11  2.3  2     NuStep   Level  -  2  2  3  4    SPM  -  60  60  60  -   Minutes  -  15  15  15  15    METs  -  -  1.9  1.4  2.4     T5 Nustep   Level  -  -  -  -  4   Minutes  -  -  -  -  15   METs  -  -  -  -  2.1   Row Name 03/27/19 1600 04/08/19 1100 04/22/19 1300 05/07/19 0900       Response to Exercise   Blood Pressure (Admit)  -  134/54  132/58  150/70    Blood Pressure (Exercise)  -  160/58  148/58  140/62    Blood Pressure (Exit)  -  130/56  120/58  150/80    Heart Rate (Admit)  -  109 bpm  88 bpm  87 bpm    Heart Rate (Exercise)  -  110 bpm  112 bpm  114 bpm    Heart Rate (Exit)  -  103  bpm  94 bpm  97 bpm    Oxygen Saturation (Admit)  -  95 %  -  98 %    Oxygen Saturation (Exercise)  -  93 %  -  94 %    Oxygen Saturation (Exit)  -  94 %  -  94 %    Rating of Perceived Exertion (Exercise)  -  12  15  12     Perceived Dyspnea (Exercise)  -  -  -  2    Symptoms  -  SOB with VT runs  -  none    Duration  -  Continue with 30 min of aerobic exercise without signs/symptoms of physical distress.  Continue with 30 min of aerobic exercise without signs/symptoms of physical distress.  Continue with 30 min of aerobic exercise without signs/symptoms of physical distress.    Intensity  -  THRR unchanged  THRR unchanged  THRR unchanged      Progression   Progression  -  Continue to progress workloads to maintain intensity without signs/symptoms of physical distress.  Continue to progress workloads to maintain intensity without signs/symptoms of physical distress.  Continue to progress workloads to maintain intensity without signs/symptoms of physical distress.    Average METs  -  2.78  2.7  2.16      Resistance Training   Training Prescription  -  Yes  Yes  Yes    Weight  -  3 lbs  3 lb  3 lb    Reps  -  10-15  10-15  10-15      Interval Training   Interval Training  -  No  No  No      Oxygen   Oxygen  -  -  Continuous  Continuous    Liters  -  -  1  1      Treadmill   MPH  -  1.5  1.5  1.5    Grade  -  0  0  0  Minutes  -  15  15  15     METs  -  2.15  2.15  2.15      Recumbant Bike   Level  -  2  2  2     Minutes  -  15  15  15     METs  -  2  3.08  2.3      NuStep   Level  -  4  4  4     Minutes  -  15  15  15     METs  -  2.4  2.3  2.2      T5 Nustep   Level  -  4  -  4    Minutes  -  15  -  15    METs  -  1.8  -  1.8      Biostep-RELP   Level  -  -  -  1    Minutes  -  -  -  15    METs  -  -  -  2      Home Exercise Plan   Plans to continue exercise at  [employer] (comment) Scientist, research (medical) (comment) Public house manager (comment) Scientist, research (medical) (comment) [employer]    Frequency  Add 1 additional day to program exercise sessions.  Add 2 additional days to program exercise sessions.  Add 2 additional days to program exercise sessions.  Add 2 additional days to program exercise sessions.    Initial Home Exercises Provided  03/27/19  03/27/19  03/27/19  03/27/19       Exercise Comments: Exercise Comments    Row Name 12/17/18 1016 01/29/19 1658 02/12/19 1651       Exercise Comments  Pt would like to work towards coming off her oxygen  First full day of exercise!  Patient was oriented to gym and equipment including functions, settings, policies, and procedures.  Patient's individual exercise prescription and treatment plan were reviewed.  All starting workloads were established based on the results of the 6 minute walk test done at initial orientation visit.  The plan for exercise progression was also introduced and progression will be customized based on patient's performance and goals.  Andrea [name] has been moving. 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   Some SOB and need for rescue inhaler with REC Bike use. Was able to complete exercise session without further problems.        Exercise Goals and Review: Exercise Goals    Row Name 12/17/18 1014 01/28/19 1611           Exercise Goals   Increase Physical Activity  Yes  Yes      Intervention  Provide advice, education, support and counseling about physical activity/exercise needs.;Develop an individualized exercise prescription for aerobic and resistive training based on initial evaluation findings, risk stratification, comorbidities and participant's personal goals.  Provide advice, education, support and counseling about physical activity/exercise needs.;Develop an individualized exercise prescription for aerobic and resistive training based on initial evaluation findings, risk stratification, comorbidities  and participant's personal goals.      Expected Outcomes  Long Term: Add in home exercise to make exercise part of routine and to increase amount of physical activity.;Short Term: Attend rehab on a regular basis to increase amount of physical activity.;Long Term: Exercising regularly at least 3-5 days a week.  Long Term: Add in home  exercise to make exercise part of routine and to increase amount of physical activity.;Short Term: Attend rehab on a regular basis to increase amount of physical activity.;Long Term: Exercising regularly at least 3-5 days a week.      Increase Strength and Stamina  Yes  Yes      Intervention  Provide advice, education, support and counseling about physical activity/exercise needs.;Develop an individualized exercise prescription for aerobic and resistive training based on initial evaluation findings, risk stratification, comorbidities and participant's personal goals.  Provide advice, education, support and counseling about physical activity/exercise needs.;Develop an individualized exercise prescription for aerobic and resistive training based on initial evaluation findings, risk stratification, comorbidities and participant's personal goals.      Expected Outcomes  Short Term: Increase workloads from initial exercise prescription for resistance, speed, and METs.;Short Term: Perform resistance training exercises routinely during rehab and add in resistance training at home;Long Term: Improve cardiorespiratory fitness, muscular endurance and strength as measured by increased METs and functional capacity (6MWT)  Short Term: Increase workloads from initial exercise prescription for resistance, speed, and METs.;Short Term: Perform resistance training exercises routinely during rehab and add in resistance training at home;Long Term: Improve cardiorespiratory fitness, muscular endurance and strength as measured by increased METs and functional capacity (6MWT)      Able to understand and  use rate of perceived exertion (RPE) scale  Yes  Yes      Intervention  Provide education and explanation on how to use RPE scale  Provide education and explanation on how to use RPE scale      Expected Outcomes  Short Term: Able to use RPE daily in rehab to express subjective intensity level;Long Term:  Able to use RPE to guide intensity level when exercising independently  Short Term: Able to use RPE daily in rehab to express subjective intensity level;Long Term:  Able to use RPE to guide intensity level when exercising independently      Able to understand and use Dyspnea scale  Yes  Yes      Intervention  Provide education and explanation on how to use Dyspnea scale  Provide education and explanation on how to use Dyspnea scale      Expected Outcomes  Short Term: Able to use Dyspnea scale daily in rehab to express subjective sense of shortness of breath during exertion;Long Term: Able to use Dyspnea scale to guide intensity level when exercising independently  Short Term: Able to use Dyspnea scale daily in rehab to express subjective sense of shortness of breath during exertion;Long Term: Able to use Dyspnea scale to guide intensity level when exercising independently      Knowledge and understanding of Target Heart Rate Range (THRR)  Yes  Yes      Intervention  Provide education and explanation of THRR including how the numbers were predicted and where they are located for reference  Provide education and explanation of THRR including how the numbers were predicted and where they are located for reference      Expected Outcomes  Short Term: Able to state/look up THRR;Short Term: Able to use daily as guideline for intensity in rehab;Long Term: Able to use THRR to govern intensity when exercising independently  Short Term: Able to state/look up THRR;Short Term: Able to use daily as guideline for intensity in rehab;Long Term: Able to use THRR to govern intensity when exercising independently      Able to  check pulse independently  Yes  Yes  Intervention  Provide education and demonstration on how to check pulse in carotid and radial arteries.;Review the importance of being able to check your own pulse for safety during independent exercise  Provide education and demonstration on how to check pulse in carotid and radial arteries.;Review the importance of being able to check your own pulse for safety during independent exercise      Expected Outcomes  Short Term: Able to explain why pulse checking is important during independent exercise;Long Term: Able to check pulse independently and accurately  Short Term: Able to explain why pulse checking is important during independent exercise;Long Term: Able to check pulse independently and accurately      Understanding of Exercise Prescription  Yes  Yes      Intervention  Provide education, explanation, and written materials on patient's individual exercise prescription  Provide education, explanation, and written materials on patient's individual exercise prescription      Expected Outcomes  Short Term: Able to explain program exercise prescription;Long Term: Able to explain home exercise prescription to exercise independently  Short Term: Able to explain program exercise prescription;Long Term: Able to explain home exercise prescription to exercise independently         Exercise Goals Re-Evaluation : Exercise Goals Re-Evaluation    Row Name 01/07/19 1059 01/29/19 1658 02/14/19 1049 02/20/19 1605 02/25/19 1501     Exercise Goal Re-Evaluation   Exercise Goals Review  Increase Physical Activity;Increase Strength and Stamina;Understanding of Exercise Prescription  Able to understand and use rate of perceived exertion (RPE) scale;Knowledge and understanding of Target Heart Rate Range (THRR);Understanding of Exercise Prescription  Increase Physical Activity;Increase Strength and Stamina;Able to understand and use rate of perceived exertion (RPE) scale;Knowledge  and understanding of Target Heart Rate Range (THRR);Able to check pulse independently;Understanding of Exercise Prescription  Increase Physical Activity;Increase Strength and Stamina;Able to understand and use rate of perceived exertion (RPE) scale;Knowledge and understanding of Target Heart Rate Range (THRR);Able to check pulse independently;Understanding of Exercise Prescription  Increase Physical Activity;Increase Strength and Stamina;Able to understand and use rate of perceived exertion (RPE) scale;Knowledge and understanding of Target Heart Rate Range (THRR);Able to check pulse independently;Understanding of Exercise Prescription   Comments  Andrea [name] has been exercising some.  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  She aims for at least 30 min every day!!  Overall, she is feeling good and eager to start rehab in the gym and get back on a treadmill again.  Reviewed RPE scale, THR and program prescription with pt today.  Pt voiced understanding and was given a copy of goals to take home.  Andrea [name] is doing well with exercise so far.  She works at prescribed RPE/HR ranges. Staff will monitor progress.  Andrea [name] enjoys coming to Cardiac Rehab. Patient is very cheerful and is seeing positive. Patient has started driving. Patient was able to put her walker in the car and put oxygen in the car. Patient was unable to do any of that before the program. Patient reports that becoming easier all the time. Increasing workloads on machines weekly. Patient is not as breathless as last week in the program.  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  She wants to get off oxygen.   Expected Outcomes  Short: Continue to at least walk daily.  Long: Get started with rehab in gym.  Short: Use RPE daily to regulate intensity. Long: Follow program prescription in  THR.  Short - attend class consistently Long - improve overall MET level  Short- attending class regularly,  walking, and riding bike at home. Long- exercising on days off for a total of 5 days per week  Short - build syamina walking Long - increase overall MET level and get off oxygen   Row Name 03/13/19 1546 03/25/19 1118 04/08/19 1118 04/22/19 1341 05/07/19 0914     Exercise Goal Re-Evaluation   Exercise Goals Review  Increase Physical Activity;Increase Strength and Stamina;Able to understand and use rate of perceived exertion (RPE) scale;Knowledge and understanding of Target Heart Rate Range (THRR);Able to check pulse independently;Understanding of Exercise Prescription  Increase Physical Activity;Increase Strength and Stamina;Understanding of Exercise Prescription  Increase Physical Activity;Increase Strength and Stamina;Understanding of Exercise Prescription  Increase Physical Activity;Increase Strength and Stamina;Able to understand and use rate of perceived exertion (RPE) scale;Able to understand and use Dyspnea scale;Knowledge and understanding of Target Heart Rate Range (THRR);Able to check pulse independently;Understanding of Exercise Prescription  Increase Physical Activity;Increase Strength and Stamina;Understanding of Exercise Prescription   Comments  Andrea [name] has done well on the TM.  She has reduced oxygen to 1L and sats remained above 88.  Andrea [name] is doing well in rehab.  She continues to have good saturations on 1L of oxygen.  We will try to her off her oxygen on the seated equipment.  If she does well, we will try to increase her workloads as well. We will continue to monitor her progress.  Andrea [name] has been doing well in rehab.  She is still using 1L most days.  Yesterday, she was very SOB as she was having small runs of VT.  She knows that exercise makes her feel better so she came to class to exercise.  We will continue to monitor her progress.  Andrea [name] oxygen has been staying 93 and above with exercise.  she attends consistently and works in [place] is doing well in rehab.  She improved  her walk test by 67%!!!  She has come a long way and feeling better.  We will continue to monitor her progress.   Expected Outcomes  Short - continue to use TM Long increase MET level and get off oxygen  Short: Try room air on seated equipment.  Long: Continue to improve stamina.  Short: Continue to work on breathing better.  Long: Continue to improve stamina.  Short - continue to attend consistently Long : be able to exercise without supplemental oxygen  Short: Graduate!!  Long: Continue to walk independently      Discharge Exercise Prescription (Final Exercise Prescription Changes): Exercise Prescription Changes - 05/07/19 0900      Response to Exercise   Blood Pressure (Admit)  150/70    Blood Pressure (Exercise)  140/62    Blood Pressure (Exit)  150/80    Heart Rate (Admit)  87 bpm    Heart Rate (Exercise)  114 bpm    Heart Rate (Exit)  97 bpm    Oxygen Saturation (Admit)  98 %    Oxygen Saturation (Exercise)  94 %    Oxygen Saturation (Exit)  94 %    Rating of Perceived Exertion (Exercise)  12    Perceived Dyspnea (Exercise)  2    Symptoms  none    Duration  Continue with 30 min of aerobic exercise without signs/symptoms of physical distress.    Intensity  THRR unchanged      Progression   Progression  Continue to progress workloads  to maintain intensity without signs/symptoms of physical distress.    Average METs  2.16      Resistance Training   Training Prescription  Yes    Weight  3 lb    Reps  10-15      Interval Training   Interval Training  No      Oxygen   Oxygen  Continuous    Liters  1      Treadmill   MPH  1.5    Grade  0    Minutes  15    METs  2.15      Recumbant Bike   Level  2    Minutes  15    METs  2.3      NuStep   Level  4    Minutes  15    METs  2.2      T5 Nustep   Level  4    Minutes  15    METs  1.8      Biostep-RELP   Level  1    Minutes  15    METs  2      Home Exercise Plan   Plans to continue exercise at  [employer] (comment)   [employer]   Frequency  Add 2 additional days to program exercise sessions.    Initial Home Exercises Provided  03/27/19       Nutrition:  Target Goals: Understanding of nutrition guidelines, daily intake of sodium <1558m, cholesterol <2013m calories 30% from fat and 7% or less from saturated fats, daily to have 5 or more servings of fruits and vegetables.  Biometrics:    Nutrition Therapy Plan and Nutrition Goals: Nutrition Therapy & Goals - 01/28/19 1340      Nutrition Therapy   Diet  Low Na, HH, DM diet    Protein (specify units)  60-65g    Fiber  25 grams    Whole Grain Foods  3 servings    Saturated Fats  12 max. grams    Fruits and Vegetables  5 servings/day    Sodium  1.5 grams      Personal Nutrition Goals   Nutrition Goal  ST: Adding snacks in between meals to reduce mindless eating LT: breath without O2 and lose 15 lbs (155-->140 lbs)    Comments  Pt on Lasix. Pt reports eating cheerios with 2% milk (wants to change to almond milk but doesn't like it, talked about soymilk) OR frozen pancakes (every 1-2 weeks) OR oatmeal and decaf coffee (they told her in the hospital not the have caffiene but is unsure if thats still true, told her to call her doctor and ask). Pt will drink diet soda. L chicken salad sandwich on whole wheat (used to be tomato, now cant tolerate tomatoes) with some cheetos (puffed). Will snack on cheetos now in quarentine. D chicken (doesn't like other meat) or pinto bean and broccoli casserole. Will watch her Na and says her dr told her that her numbers are fine. Pt would like to work on the amount of snacks she eats due to boredom ; discussed midful eating and halthy snacking. Discussed HH and DM eating. Discussed increased protein and calorie needs due to her pulmonary issues and maintaining muscle mass. Pt reports sister will shop for her and is trying to get her to buy [facility] DM friendly foods, just got her to stop bringing sweets.       Intervention Plan   Intervention  Prescribe, educate and counsel regarding individualized specific dietary modifications aiming towards targeted core components such as weight, hypertension, lipid management, diabetes, heart failure and other comorbidities.;Nutrition handout(s) given to patient.    Expected Outcomes  Short Term Goal: Understand basic principles of dietary content, such as calories, fat, sodium, cholesterol and nutrients.;Short Term Goal: A plan has been developed with personal nutrition goals set during dietitian appointment.;Long Term Goal: Adherence to prescribed nutrition plan.       Nutrition Assessments: Nutrition Assessments - 05/20/19 0827      MEDFICTS Scores   Post Score  32       Nutrition Goals Re-Evaluation: Nutrition Goals Re-Evaluation    Andrea [name] Name 02/24/19 1548 03/24/19 1549 04/29/19 1154 05/05/19 1612       Goals   Nutrition Goal  ST:  continue healthy snacking and increase vegetable and fruit intake for now LT: breath without O2 and lose 15 lbs (155-->140 lbs)  ST:  continue healthy snacking and increase vegetable and fruit intake for now LT: breath without O2 and lose 15 lbs (155-->140 lbs)  ST:  continue healthy snacking and increase vegetable and fruit intake for now LT: breath without O2 and lose 15 lbs (155-->140 lbs)  ST:  continue healthy snacking and increase vegetable and fruit intake for now LT: breath without O2 and lose 15 lbs (155-->140 lbs)    Comment  Eating fruit and vegetables as a part of snack and meal. Vegetables like broccoli brussell sprouts, squash, celery, carrots, etc. Fruit apples, tangerines, grapes, bananas. 3 fruit/day, 4 vegetables/day. Pt eating more mindfully peanutbutter and celery. feeling full, doesn't feel as tired, and has regular bowel movements.  Eating fruit and vegetables as a part of snack and meal. Vegetables like broccoli brussell sprouts, squash, celery, carrots, etc. Fruit apples, tangerines, grapes, bananas. 3  fruit/day, 4 vegetables/day. Pt eating more mindfully peanutbutter and celery. feeling full, doesn't feel as tired, and has regular bowel movements. Continue to use healthy snacks to moderate hunger during the day and keep the variety. Pt reports not wanting to make a new goal or needing anything from this RD.  Continue with current changes  Pt reports she would do a lot better if her sister would stop buying "junk". Pt reports now eating oatmeal again with different kinds of berries. Whole wheat sugar free bread, pt like with toast.    Expected Outcome  continue progress with HH eating  ST:  continue healthy snacking and increase vegetable and fruit intake for now LT: breath without O2 and lose 15 lbs (155-->140 lbs)  ST:  continue healthy snacking and increase vegetable and fruit intake for now LT: breath without O2 and lose 15 lbs (155-->140 lbs)  ST:  continue healthy snacking and increase vegetable and fruit intake for now LT: breath without O2 and lose 15 lbs (155-->140 lbs)       Nutrition Goals Discharge (Final Nutrition Goals Re-Evaluation): Nutrition Goals Re-Evaluation - 05/05/19 1612      Goals   Nutrition Goal  ST:  continue healthy snacking and increase vegetable and fruit intake for now LT: breath without O2 and lose 15 lbs (155-->140 lbs)    Comment  Pt reports she would do a lot better if her sister would stop buying "junk". Pt reports now eating oatmeal again with different kinds of berries. Whole wheat sugar free bread, pt like with toast.    Expected Outcome  ST:  continue healthy snacking and increase vegetable and fruit intake for now  LT: breath without O2 and lose 15 lbs (155-->140 lbs)       Psychosocial: Target Goals: Acknowledge presence or absence of significant depression and/or stress, maximize coping skills, provide positive support system. Participant is able to verbalize types and ability to use techniques and skills needed for reducing stress and depression.    Initial Review & Psychosocial Screening: Initial Psych Review & Screening - 01/20/19 1052      Initial Review   Current issues with  Current Stress Concerns;Current Sleep Concerns    Source of Stress Concerns  Chronic Illness    Comments  Andrea [name] is self isolating away from family and friends. Her MD recently told her not to go back to church because of being high risk. She misses it, but stays in contact with her church friends and her family daily. She reports not sleeping well mainly because of the concentrator's noise level. She is also getting ready to move to a bigger apartment in September which she is really excited about!      Family Dynamics   Good Support System?  Yes      Barriers   Psychosocial barriers to participate in program  There are no identifiable barriers or psychosocial needs.      Screening Interventions   Interventions  Encouraged to exercise    Expected Outcomes  Long Term Goal: Stressors or current issues are controlled or eliminated.;Short Term goal: Utilizing psychosocial counselor, staff and physician to assist with identification of specific Stressors or current issues interfering with healing process. Setting desired goal for each stressor or current issue identified.;Short Term goal: Identification and review with participant of any Quality of Life or Depression concerns found by scoring the questionnaire.;Long Term goal: The participant improves quality of Life and PHQ9 Scores as seen by post scores and/or verbalization of changes       Quality of Life Scores:  Quality of Life - 05/20/19 0827      Quality of Life Scores   Health/Function Post  21.8 %      Scores of 19 and below usually indicate a poorer quality of life in these areas.  A difference of  2-3 points is a clinically meaningful difference.  A difference of 2-3 points in the total score of the Quality of Life Index has been associated with significant improvement in overall quality of life,  self-image, physical symptoms, and general health in studies assessing change in quality of life.  PHQ-9: Recent Review Flowsheet Data    There is no flowsheet data to display.     Interpretation of Total Score  Total Score Depression Severity:  1-4 = Minimal depression, 5-9 = Mild depression, 10-14 = Moderate depression, 15-19 = Moderately severe depression, 20-27 = Severe depression   Psychosocial Evaluation and Intervention:   Psychosocial Re-Evaluation: Psychosocial Re-Evaluation    [place] Name 01/07/19 1111 02/20/19 1546 03/26/19 1541         Psychosocial Re-Evaluation   Current issues with  Current Stress Concerns  Current Stress Concerns  None Identified     Comments  She is feeling good at home.  She is worried about her younger daughter not wearing a mask.  Her oldest is a Marine scientist and has tested positive for COVID already.  She is worried for her family, but coping the best she can.  Andrea [name] is stressed about moving. She is moving. She is working really hard to move all of her belongings to a bigger apartment. Her oxygen and her walker  were too crowded in her old apartment. Patient reports having chest pain when working hard and being stressed and that it went away with rest. Chest pain was a 2 out of 10. Patient was encouraged to ask for help and is going to do so. Patient knows a friend that will help this weekend.  She has now moved and her stress has been lifted alot since she can get around better. She has everything moved in straightened out and has not been at ease.     Expected Outcomes  Short: Continue to talk to daughters on phone for connection and get started with rehab.  Long: Continue to stay positive.  Short: Patient will have everything moved by Monday by asking friend for help. Long: Patient will work on conserving energy by working in [employer], sitting when she can to do certain activities, and taking breaks during big house chores as needed. Patient understanding  that she needs to find a new way of completing tasks then before procedure.  Short: attend [facility] regularly. Long: maintain a workout regimine to keep stress at a minumum.     Interventions  Encouraged to attend Cardiac Rehabilitation for the exercise  -  Encouraged to attend Cardiac Rehabilitation for the exercise     Continue Psychosocial Services   Follow up required by staff  -  Follow up required by staff        Psychosocial Discharge (Final Psychosocial Re-Evaluation): Psychosocial Re-Evaluation - 03/26/19 1541      Psychosocial Re-Evaluation   Current issues with  None Identified    Comments  She has now moved and her stress has been lifted alot since she can get around better. She has everything moved in straightened out and has not been at ease.    Expected Outcomes  Short: attend [facility] regularly. Long: maintain a workout regimine to keep stress at a minumum.    Interventions  Encouraged to attend Cardiac Rehabilitation for the exercise    Continue Psychosocial Services   Follow up required by staff       Vocational Rehabilitation: Provide vocational rehab assistance to qualifying candidates.   Vocational Rehab Evaluation & Intervention: Vocational Rehab - 01/20/19 1044      Initial Vocational Rehab Evaluation & Intervention   Assessment shows need for Vocational Rehabilitation  No       Education: Education Goals: Education classes will be provided on a variety of topics geared toward better understanding of heart health and risk factor modification. Participant will state understanding/return demonstration of topics presented as noted by education test scores.  Learning Barriers/Preferences: Learning Barriers/Preferences - 01/20/19 1052      Learning Barriers/Preferences   Learning Barriers  None    Learning Preferences  None       Education Topics:  AED/CPR: - Group verbal and written instruction with the use of models to demonstrate the basic use of  the AED with the basic ABC's of resuscitation.   General Nutrition Guidelines/Fats and Fiber: -Group instruction provided by verbal, written material, models and posters to present the general guidelines for heart healthy nutrition. Gives an explanation and review of dietary fats and fiber.   Controlling Sodium/Reading Food Labels: -Group verbal and written material supporting the discussion of sodium use in heart healthy nutrition. Review and explanation with models, verbal and written materials for utilization of the food label.   Exercise Physiology & General Exercise Guidelines: - Group verbal and written instruction with models to review the exercise physiology  of the cardiovascular system and associated critical values. Provides general exercise guidelines with specific guidelines to those with heart or lung disease.    Aerobic Exercise & Resistance Training: - Gives group verbal and written instruction on the various components of exercise. Focuses on aerobic and resistive training programs and the benefits of this training and how to safely progress through these programs..   Cardiac Rehab from 05/01/2019 in [facility] Cardiac and Pulmonary Rehab  Date  05/01/19  Educator  [facility]  Instruction Review Code  1- Verbalizes Understanding      Flexibility, Balance, Mind/Body Relaxation: Provides group verbal/written instruction on the benefits of flexibility and balance training, including mind/body exercise modes such as yoga, pilates and tai chi.  Demonstration and skill practice provided.   Stress and Anxiety: - Provides group verbal and written instruction about the health risks of elevated stress and causes of high stress.  Discuss the correlation between heart/lung disease and anxiety and treatment options. Review healthy ways to manage with stress and anxiety.   Depression: - Provides group verbal and written instruction on the correlation between heart/lung disease and depressed mood,  treatment options, and the stigmas associated with seeking treatment.   Anatomy & Physiology of the Heart: - Group verbal and written instruction and models provide basic cardiac anatomy and physiology, with the coronary electrical and arterial systems. Review of Valvular disease and Heart Failure   Cardiac Rehab from 05/01/2019 in [facility] Cardiac and Pulmonary Rehab  Date  05/01/19  Educator  [facility]  Instruction Review Code  1- Verbalizes Understanding      Cardiac Procedures: - Group verbal and written instruction to review commonly prescribed medications for heart disease. Reviews the medication, class of the drug, and side effects. Includes the steps to properly store meds and maintain the prescription regimen. (beta blockers and nitrates)   Cardiac Rehab from 05/01/2019 in [facility] Cardiac and Pulmonary Rehab  Date  05/01/19  Educator  [facility]  Instruction Review Code  1- Verbalizes Understanding      Cardiac Medications I: - Group verbal and written instruction to review commonly prescribed medications for heart disease. Reviews the medication, class of the drug, and side effects. Includes the steps to properly store meds and maintain the prescription regimen.   Cardiac Medications II: -Group verbal and written instruction to review commonly prescribed medications for heart disease. Reviews the medication, class of the drug, and side effects. (all other drug classes)   Cardiac Rehab from 04/17/2019 in [facility] Cardiac and Pulmonary Rehab  Date  04/17/19  Educator  [facility]  Instruction Review Code  1- Verbalizes Understanding       Go Sex-Intimacy & Heart Disease, Get SMART - Goal Setting: - Group verbal and written instruction through game format to discuss heart disease and the return to sexual intimacy. Provides group verbal and written material to discuss and apply goal setting through the application of the S.M.A.R.T. Method.   Cardiac Rehab from 05/01/2019 in [facility] Cardiac and Pulmonary Rehab   Date  05/01/19  Educator  [facility]  Instruction Review Code  1- Verbalizes Understanding      Other Matters of the Heart: - Provides group verbal, written materials and models to describe Stable Angina and Peripheral Artery. Includes description of the disease process and treatment options available to the cardiac patient.   Exercise & Equipment Safety: - Individual verbal instruction and demonstration of equipment use and safety with use of the equipment.   Cardiac Rehab from 04/17/2019 in [facility] Cardiac  and Pulmonary Rehab  Date  01/28/19  Educator  [place]  Instruction Review Code  1- Verbalizes Understanding      Infection Prevention: - Provides verbal and written material to individual with discussion of infection control including proper hand washing and proper equipment cleaning during exercise session.   Cardiac Rehab from 04/17/2019 in [facility] Cardiac and Pulmonary Rehab  Date  01/28/19  Educator  [place]  Instruction Review Code  1- Verbalizes Understanding      Falls Prevention: - Provides verbal and written material to individual with discussion of falls prevention and safety.   Cardiac Rehab from 04/17/2019 in [facility] Cardiac and Pulmonary Rehab  Date  01/28/19  Educator  [place]  Instruction Review Code  1- Verbalizes Understanding      Diabetes: - Individual verbal and written instruction to review signs/symptoms of diabetes, desired ranges of glucose level fasting, after meals and with exercise. Acknowledge that pre and post exercise glucose checks will be done for 3 sessions at entry of program.   Cardiac Rehab from 04/17/2019 in [facility] Cardiac and Pulmonary Rehab  Date  01/28/19  Educator  [place]  Instruction Review Code  1- Verbalizes Understanding      Know Your Numbers and Risk Factors: -Group verbal and written instruction about important numbers in your health.  Discussion of what are risk factors and how they play a role in the disease process.  Review of Cholesterol, Blood  Pressure, Diabetes, and BMI and the role they play in your overall health.   Cardiac Rehab from 04/17/2019 in [facility] Cardiac and Pulmonary Rehab  Date  04/17/19  Educator  [facility]  Instruction Review Code  1- Verbalizes Understanding      Sleep Hygiene: -Provides group verbal and written instruction about how sleep can affect your health.  Define sleep hygiene, discuss sleep cycles and impact of sleep habits. Review good sleep hygiene tips.    Other: -Provides group and verbal instruction on various topics (see comments)   Knowledge Questionnaire Score: Knowledge Questionnaire Score - 05/16/19 0837      Knowledge Questionnaire Score   Post Score  24/26       Core Components/Risk Factors/Patient Goals at Admission: Personal Goals and Risk Factors at Admission - 12/17/18 1009      Core Components/Risk Factors/Patient Goals on Admission    Weight Management  Yes;Weight Loss    Intervention  Weight Management: Develop a combined nutrition and exercise program designed to reach desired caloric intake, while maintaining appropriate intake of nutrient and fiber, sodium and fats, and appropriate energy expenditure required for the weight goal.;Weight Management: Provide education and appropriate resources to help participant work on and attain dietary goals.    Expected Outcomes  Short Term: Continue to assess and modify interventions until short term weight is achieved;Long Term: Adherence to nutrition and physical activity/exercise program aimed toward attainment of established weight goal;Weight Loss: Understanding of general recommendations for a balanced deficit meal plan, which promotes 1-2 lb weight loss per week and includes a negative energy balance of [phone] kcal/d;Understanding recommendations for meals to include 15-35% energy as protein, 25-35% energy from fat, 35-60% energy from carbohydrates, less than 220m of dietary cholesterol, 20-35 gm of total fiber daily;Understanding of  distribution of calorie intake throughout the day with the consumption of 4-5 meals/snacks    Diabetes  Yes    Intervention  Provide education about signs/symptoms and action to take for hypo/hyperglycemia.;Provide education about proper nutrition, including hydration, and aerobic/resistive exercise prescription along with  prescribed medications to achieve blood glucose in normal ranges: Fasting glucose 65-99 mg/dL    Expected Outcomes  Short Term: Participant verbalizes understanding of the signs/symptoms and immediate care of hyper/hypoglycemia, proper foot care and importance of medication, aerobic/resistive exercise and nutrition plan for blood glucose control.;Long Term: Attainment of HbA1C < 7%.    Hypertension  Yes    Intervention  Provide education on lifestyle modifcations including regular physical activity/exercise, weight management, moderate sodium restriction and increased consumption of fresh fruit, vegetables, and low fat dairy, alcohol moderation, and smoking cessation.;Monitor prescription use compliance.    Expected Outcomes  Short Term: Continued assessment and intervention until BP is < 140/41m HG in hypertensive participants. < 130/860mHG in hypertensive participants with diabetes, heart failure or chronic kidney disease.;Long Term: Maintenance of blood pressure at goal levels.    Lipids  Yes    Intervention  Provide education and support for participant on nutrition & aerobic/resistive exercise along with prescribed medications to achieve LDL <7022mHDL >65m68m  Expected Outcomes  Long Term: Cholesterol controlled with medications as prescribed, with individualized exercise RX and with personalized nutrition plan. Value goals: LDL < 70mg58mL > 40 mg.;Short Term: Participant states understanding of desired cholesterol values and is compliant with medications prescribed. Participant is following exercise prescription and nutrition guidelines.       Core Components/Risk  Factors/Patient Goals Review:  Goals and Risk Factor Review    Row Name 01/07/19 1112 01/20/19 1046 01/28/19 1612 02/20/19 1554 03/26/19 1544     Core Components/Risk Factors/Patient Goals Review   Personal Goals Review  Weight Management/Obesity;Hypertension  Weight Management/Obesity;Diabetes;Hypertension  Weight Management/Obesity;Diabetes;Hypertension  Weight Management/Obesity;Diabetes;Hypertension;Other  Weight Management/Obesity;Hypertension;Diabetes;Lipids   Review  Her weight and blood pressures have been good and she has not noted any problems.  Her weight is a little up, but slowly. She is keeping an eye on it, but still wants to lose weight. Her current weight is 153lb and she wants to get down to 140 lb. Her blood pressure has been running well. She feels like she is managing her diabetes well.  Her weight is a little up, but slowly. She is keeping an eye on it, but still wants to lose weight. Her current weight is 153lb and she wants to get down to 140 lb. Her blood pressure has been running well. She feels like she is managing her diabetes well.  Patient reports no weight loss but that her clothes feel less tight and she thinks that she is firming up. Patient is determined to lose weight. Patient does have a blood pressure cuff at home and checks it once daily. BP has been elevated some when entering Cardiac Rehab but fine for exit BP. Patient checks blood glucose every morning and sometimes at night. Fasting blood sugar is in the normal range and night time blood sugar is never over 200 mg/dL. Patient takes all of her medications the way the doctor has prescribed them. Patient said the doctor is taking her off of blood thinners this month.Patient really wants to become healthy enough to stop wearing oxygen.  Patient wants to be aroung 140 pounds. She weighed 159 pounds today. She is not sure what she is eating to cause her weight going up. She states she has been eating less. Her A1C is 7.7.  She states it is better than when she had her heart surgury. Andrea [name] is what she is maintaining her blood sugars with. She checks her sugar at home  regularly.   Expected Outcomes  Short and Long: Continue to monitor risk factors.  Short: come to Cardiac Rehab to learn more about risk factors. Long: become independent with managing risk factors.  Short: come to Cardiac Rehab to learn more about risk factors. Long: become independent with managing risk factors.  Short: Patient wants to continue becoming stronger, lose weight, and needing less oxygen. Long: Patient wants to stop or decrease wearing oxygen by increasing her lung function with exercise.  Short: lose 5 pounds in two weeks. Long: maintain weight loss and continue to lose weight.      Core Components/Risk Factors/Patient Goals at Discharge (Final Review):  Goals and Risk Factor Review - 03/26/19 1544      Core Components/Risk Factors/Patient Goals Review   Personal Goals Review  Weight Management/Obesity;Hypertension;Diabetes;Lipids    Review  Patient wants to be aroung 140 pounds. She weighed 159 pounds today. She is not sure what she is eating to cause her weight going up. She states she has been eating less. Her A1C is 7.7. She states it is better than when she had her heart surgury. Andrea [name] is what she is maintaining her blood sugars with. She checks her sugar at home regularly.    Expected Outcomes  Short: lose 5 pounds in two weeks. Long: maintain weight loss and continue to lose weight.       ITP Comments: ITP Comments    Row Name 12/10/18 1057 12/17/18 1006 12/25/18 1004 01/07/19 1044 01/20/19 1035   ITP Comments  Initial Visit for Virtual [facility] today. Consent and intake completed. Appt made for EP and RD .  Completed initial ExRx created and sent to Dr. Emily Filbert, Medical Director to review and sign.  Grasiela contacted [place] via the [employer] App that she needs to hold off on exercise  until cleared by her doctor.  Virtual appt completed.  She is feeling better and has been wearing an event monitor for 14 days. She turns it in on Thurs 01/09/19.  She has had a few episodes of "weakness" feeling and would like to wait to actually come in person until after her f/u visit on 01/27/19 with Dr. Sabra Heck.  RN Virtual orientation completed. Diagnosis can be found in CE 2/18. EP/RD scheduled for 8/4   Row Name 01/29/19 1658 02/05/19 0559 02/12/19 1650 03/05/19 0607 04/02/19 1257   ITP Comments  First full day of exercise!  Patient was oriented to gym and equipment including functions, settings, policies, and procedures.  Patient's individual exercise prescription and treatment plan were reviewed.  All starting workloads were established based on the results of the 6 minute walk test done at initial orientation visit.  The plan for exercise progression was also introduced and progression will be customized based on patient's performance and goals.  30 Day Review Completed today. Continue with ITP unless changed by Medical Director review.  Zamyia has been moving. 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   Some SOB and need for rescue inhaler with REC Bike use. Was able to complete exercise session without further problems.  30 Day review. Continue with ITP unless directed changes per Medical Director review.  30 day review completed. ITP sent to Dr. Emily Filbert, Medical Director of Cardiac and Pulmonary Rehab. Continue with ITP unless changes are made by physician.  Department closed starting 10/2 until further notice by infection prevention and Health at Work teams for [place].   [place] Name 04/07/19  1724 04/30/19 0629 05/01/19 0731 05/20/19 1329 05/27/19 1324   ITP Comments  Faxed strips with PVCs to [facility]. Reviewed symptoms with patient and safety.  30 day review completed. Continue with ITP sent to Dr. Emily Filbert, Medical Director of Cardiac and Pulmonary Rehab for review ,  changes as needed and signature.  PalpitationsHR elevated with perceived skipped beats in 11/2018. [place] 12/2018 with 31 episodes of SVT (longest 41 sec, fastest 144). Still has rare symptoms that last a few seconds and resolve on their own but not bothersome. No intervention at this time. Notes from MD visit 11/2 regarding heart rhythm  Makynna has been out since 11/11/202 due to Covid 19.  Called to check on patient. She was discharged from hospital for COVID-19 on 11/24.  She is under a 14 day quarantine. She cannot return to rehab until 12/10.  She is still having symptoms.  She only has three visits left.  She will decide if she wants to return to finish.  She is planning to do pulmonary rehab now as well.   [place] Name 05/28/19 1000           ITP Comments  30 day review competed . ITP sent to Dr Emily Filbert for review, changes as needed and ITP approval signature.         Comments:

## 2019-05-31 ENCOUNTER — Inpatient Hospital Stay (HOSPITAL_COMMUNITY)
Admission: EM | Admit: 2019-05-31 | Discharge: 2019-06-10 | DRG: 193 | Disposition: A | Discharge status: Home Health | Payer: Medicare Other | Attending: Student | Admitting: Student

## 2019-05-31 ENCOUNTER — Other Ambulatory Visit: Payer: Self-pay

## 2019-05-31 ENCOUNTER — Emergency Department (HOSPITAL_COMMUNITY): Payer: Medicare Other

## 2019-05-31 ENCOUNTER — Encounter (HOSPITAL_COMMUNITY): Payer: Self-pay

## 2019-05-31 DIAGNOSIS — J189 Pneumonia, unspecified organism: Principal | ICD-10-CM | POA: Diagnosis present

## 2019-05-31 DIAGNOSIS — J9621 Acute and chronic respiratory failure with hypoxia: Secondary | ICD-10-CM | POA: Diagnosis present

## 2019-05-31 DIAGNOSIS — E11649 Type 2 diabetes mellitus with hypoglycemia without coma: Secondary | ICD-10-CM | POA: Diagnosis not present

## 2019-05-31 DIAGNOSIS — D638 Anemia in other chronic diseases classified elsewhere: Secondary | ICD-10-CM | POA: Diagnosis not present

## 2019-05-31 DIAGNOSIS — J440 Chronic obstructive pulmonary disease with acute lower respiratory infection: Secondary | ICD-10-CM | POA: Diagnosis present

## 2019-05-31 DIAGNOSIS — D696 Thrombocytopenia, unspecified: Secondary | ICD-10-CM | POA: Diagnosis present

## 2019-05-31 DIAGNOSIS — N1832 Chronic kidney disease, stage 3b: Secondary | ICD-10-CM | POA: Diagnosis present

## 2019-05-31 DIAGNOSIS — Z7951 Long term (current) use of inhaled steroids: Secondary | ICD-10-CM

## 2019-05-31 DIAGNOSIS — N179 Acute kidney failure, unspecified: Secondary | ICD-10-CM | POA: Diagnosis present

## 2019-05-31 DIAGNOSIS — Z955 Presence of coronary angioplasty implant and graft: Secondary | ICD-10-CM | POA: Diagnosis not present

## 2019-05-31 DIAGNOSIS — E876 Hypokalemia: Secondary | ICD-10-CM | POA: Diagnosis present

## 2019-05-31 DIAGNOSIS — Z9981 Dependence on supplemental oxygen: Secondary | ICD-10-CM | POA: Diagnosis not present

## 2019-05-31 DIAGNOSIS — I2510 Atherosclerotic heart disease of native coronary artery without angina pectoris: Secondary | ICD-10-CM | POA: Diagnosis present

## 2019-05-31 DIAGNOSIS — U071 COVID-19: Secondary | ICD-10-CM | POA: Diagnosis present

## 2019-05-31 DIAGNOSIS — E872 Acidosis: Secondary | ICD-10-CM | POA: Diagnosis not present

## 2019-05-31 DIAGNOSIS — I480 Paroxysmal atrial fibrillation: Secondary | ICD-10-CM | POA: Diagnosis present

## 2019-05-31 DIAGNOSIS — Y95 Nosocomial condition: Secondary | ICD-10-CM | POA: Diagnosis present

## 2019-05-31 DIAGNOSIS — Z8619 Personal history of other infectious and parasitic diseases: Secondary | ICD-10-CM | POA: Diagnosis not present

## 2019-05-31 DIAGNOSIS — Z7982 Long term (current) use of aspirin: Secondary | ICD-10-CM

## 2019-05-31 DIAGNOSIS — T380X5A Adverse effect of glucocorticoids and synthetic analogues, initial encounter: Secondary | ICD-10-CM | POA: Diagnosis not present

## 2019-05-31 DIAGNOSIS — I252 Old myocardial infarction: Secondary | ICD-10-CM

## 2019-05-31 DIAGNOSIS — R5381 Other malaise: Secondary | ICD-10-CM | POA: Diagnosis not present

## 2019-05-31 DIAGNOSIS — N189 Chronic kidney disease, unspecified: Secondary | ICD-10-CM | POA: Diagnosis not present

## 2019-05-31 DIAGNOSIS — Z888 Allergy status to other drugs, medicaments and biological substances status: Secondary | ICD-10-CM

## 2019-05-31 DIAGNOSIS — Z90710 Acquired absence of both cervix and uterus: Secondary | ICD-10-CM

## 2019-05-31 DIAGNOSIS — R7989 Other specified abnormal findings of blood chemistry: Secondary | ICD-10-CM | POA: Diagnosis not present

## 2019-05-31 DIAGNOSIS — R0902 Hypoxemia: Secondary | ICD-10-CM

## 2019-05-31 DIAGNOSIS — E1122 Type 2 diabetes mellitus with diabetic chronic kidney disease: Secondary | ICD-10-CM | POA: Diagnosis present

## 2019-05-31 DIAGNOSIS — Z86711 Personal history of pulmonary embolism: Secondary | ICD-10-CM

## 2019-05-31 DIAGNOSIS — E118 Type 2 diabetes mellitus with unspecified complications: Secondary | ICD-10-CM

## 2019-05-31 DIAGNOSIS — Z79899 Other long term (current) drug therapy: Secondary | ICD-10-CM

## 2019-05-31 DIAGNOSIS — Z882 Allergy status to sulfonamides status: Secondary | ICD-10-CM

## 2019-05-31 DIAGNOSIS — Z8673 Personal history of transient ischemic attack (TIA), and cerebral infarction without residual deficits: Secondary | ICD-10-CM

## 2019-05-31 DIAGNOSIS — I129 Hypertensive chronic kidney disease with stage 1 through stage 4 chronic kidney disease, or unspecified chronic kidney disease: Secondary | ICD-10-CM | POA: Diagnosis present

## 2019-05-31 DIAGNOSIS — J1289 Other viral pneumonia: Secondary | ICD-10-CM | POA: Diagnosis not present

## 2019-05-31 DIAGNOSIS — K219 Gastro-esophageal reflux disease without esophagitis: Secondary | ICD-10-CM | POA: Diagnosis present

## 2019-05-31 DIAGNOSIS — Z951 Presence of aortocoronary bypass graft: Secondary | ICD-10-CM

## 2019-05-31 DIAGNOSIS — E785 Hyperlipidemia, unspecified: Secondary | ICD-10-CM | POA: Diagnosis present

## 2019-05-31 DIAGNOSIS — E1165 Type 2 diabetes mellitus with hyperglycemia: Secondary | ICD-10-CM | POA: Diagnosis not present

## 2019-05-31 DIAGNOSIS — N1831 Chronic kidney disease, stage 3a: Secondary | ICD-10-CM | POA: Diagnosis not present

## 2019-05-31 DIAGNOSIS — Z7984 Long term (current) use of oral hypoglycemic drugs: Secondary | ICD-10-CM

## 2019-05-31 DIAGNOSIS — D631 Anemia in chronic kidney disease: Secondary | ICD-10-CM | POA: Diagnosis present

## 2019-05-31 DIAGNOSIS — J441 Chronic obstructive pulmonary disease with (acute) exacerbation: Secondary | ICD-10-CM | POA: Diagnosis present

## 2019-05-31 DIAGNOSIS — Z853 Personal history of malignant neoplasm of breast: Secondary | ICD-10-CM

## 2019-05-31 LAB — FERRITIN: Ferritin: 339 ng/mL — ABNORMAL HIGH (ref 11–307)

## 2019-05-31 LAB — COMPREHENSIVE METABOLIC PANEL
ALT: 25 U/L (ref 0–44)
AST: 29 U/L (ref 15–41)
Albumin: 3.1 g/dL — ABNORMAL LOW (ref 3.5–5.0)
Alkaline Phosphatase: 62 U/L (ref 38–126)
Anion gap: 13 (ref 5–15)
BUN: 48 mg/dL — ABNORMAL HIGH (ref 8–23)
CO2: 28 mmol/L (ref 22–32)
Calcium: 8.6 mg/dL — ABNORMAL LOW (ref 8.9–10.3)
Chloride: 96 mmol/L — ABNORMAL LOW (ref 98–111)
Creatinine, Ser: 1.93 mg/dL — ABNORMAL HIGH (ref 0.44–1.00)
GFR calc Af Amer: 29 mL/min — ABNORMAL LOW (ref >60)
GFR calc non Af Amer: 25 mL/min — ABNORMAL LOW (ref >60)
Glucose, Bld: 199 mg/dL — ABNORMAL HIGH (ref 70–99)
Potassium: 3.4 mmol/L — ABNORMAL LOW (ref 3.5–5.1)
Sodium: 137 mmol/L (ref 135–145)
Total Bilirubin: 0.9 mg/dL (ref 0.3–1.2)
Total Protein: 7.1 g/dL (ref 6.5–8.1)

## 2019-05-31 LAB — LACTATE DEHYDROGENASE: LDH: 216 U/L — ABNORMAL HIGH (ref 98–192)

## 2019-05-31 LAB — CBC WITH DIFFERENTIAL/PLATELET
Abs Immature Granulocytes: 0.07 10*3/uL (ref 0.00–0.07)
Basophils Absolute: 0 10*3/uL (ref 0.0–0.1)
Basophils Relative: 0 %
Eosinophils Absolute: 0 10*3/uL (ref 0.0–0.5)
Eosinophils Relative: 0 %
HCT: 34.3 % — ABNORMAL LOW (ref 36.0–46.0)
Hemoglobin: 10.9 g/dL — ABNORMAL LOW (ref 12.0–15.0)
Immature Granulocytes: 1 %
Lymphocytes Relative: 8 %
Lymphs Abs: 0.9 10*3/uL (ref 0.7–4.0)
MCH: 29.3 pg (ref 26.0–34.0)
MCHC: 31.8 g/dL (ref 30.0–36.0)
MCV: 92.2 fL (ref 80.0–100.0)
Monocytes Absolute: 0.4 10*3/uL (ref 0.1–1.0)
Monocytes Relative: 4 %
Neutro Abs: 10.4 10*3/uL — ABNORMAL HIGH (ref 1.7–7.7)
Neutrophils Relative %: 87 %
Platelets: 100 10*3/uL — ABNORMAL LOW (ref 150–400)
RBC: 3.72 MIL/uL — ABNORMAL LOW (ref 3.87–5.11)
RDW: 13.2 % (ref 11.5–15.5)
WBC: 11.8 10*3/uL — ABNORMAL HIGH (ref 4.0–10.5)
nRBC: 0 % (ref 0.0–0.2)

## 2019-05-31 LAB — C-REACTIVE PROTEIN: CRP: 15.8 mg/dL — ABNORMAL HIGH (ref <1.0)

## 2019-05-31 LAB — LACTIC ACID, PLASMA
Lactic Acid, Venous: 1.2 mmol/L (ref 0.5–1.9)
Lactic Acid, Venous: 1 mmol/L (ref 0.5–1.9)

## 2019-05-31 LAB — BRAIN NATRIURETIC PEPTIDE
B Natriuretic Peptide: 395.7 pg/mL — ABNORMAL HIGH (ref 0.0–100.0)
B Natriuretic Peptide: 335.4 pg/mL — ABNORMAL HIGH (ref 0.0–100.0)

## 2019-05-31 LAB — D-DIMER, QUANTITATIVE: D-Dimer, Quant: 2.51 ug/mL-FEU — ABNORMAL HIGH (ref 0.00–0.50)

## 2019-05-31 LAB — PROCALCITONIN: Procalcitonin: <0.1 ng/mL

## 2019-05-31 LAB — TROPONIN I (HIGH SENSITIVITY)
Troponin I (High Sensitivity): 36 ng/L — ABNORMAL HIGH (ref <18)
Troponin I (High Sensitivity): 34 ng/L — ABNORMAL HIGH (ref <18)

## 2019-05-31 MED ORDER — METHYLPREDNISOLONE SODIUM SUCC 40 MG IJ SOLR
0.5000 mg/kg | Freq: Two times a day (BID) | INTRAMUSCULAR | Status: DC
  Administered 2019-05-31 – 2019-06-01 (×2): 34 mg via INTRAVENOUS
  Filled 2019-05-31 (×2): qty 1

## 2019-05-31 MED ORDER — ZINC SULFATE 220 (50 ZN) MG PO CAPS
220.0000 mg | ORAL_CAPSULE | Freq: Every day | ORAL | Status: DC
  Administered 2019-05-31 – 2019-06-10 (×11): 220 mg via ORAL
  Filled 2019-05-31 (×11): qty 1

## 2019-05-31 MED ORDER — HYDROCOD POLST-CPM POLST ER 10-8 MG/5ML PO SUER
5.0000 mL | Freq: Two times a day (BID) | ORAL | Status: DC | PRN
  Filled 2019-05-31: qty 5

## 2019-05-31 MED ORDER — VITAMIN C 500 MG PO TABS
500.0000 mg | ORAL_TABLET | Freq: Every day | ORAL | Status: DC
  Administered 2019-05-31 – 2019-06-10 (×11): 500 mg via ORAL
  Filled 2019-05-31 (×11): qty 1

## 2019-05-31 MED ORDER — SODIUM CHLORIDE 0.9 % IV SOLN
500.0000 mg | Freq: Once | INTRAVENOUS | Status: AC
  Administered 2019-05-31: 500 mg via INTRAVENOUS
  Filled 2019-05-31: qty 500

## 2019-05-31 MED ORDER — HEPARIN SODIUM (PORCINE) 5000 UNIT/ML IJ SOLN
5000.0000 [IU] | Freq: Three times a day (TID) | INTRAMUSCULAR | Status: DC
  Administered 2019-05-31 – 2019-06-05 (×14): 5000 [IU] via SUBCUTANEOUS
  Filled 2019-05-31 (×16): qty 1

## 2019-05-31 MED ORDER — IPRATROPIUM-ALBUTEROL 20-100 MCG/ACT IN AERS
1.0000 | INHALATION_SPRAY | Freq: Four times a day (QID) | RESPIRATORY_TRACT | Status: DC
  Administered 2019-05-31 – 2019-06-03 (×14): 1 via RESPIRATORY_TRACT
  Filled 2019-05-31: qty 4

## 2019-05-31 MED ORDER — POTASSIUM CHLORIDE CRYS ER 20 MEQ PO TBCR
40.0000 meq | EXTENDED_RELEASE_TABLET | Freq: Once | ORAL | Status: AC
  Administered 2019-05-31: 40 meq via ORAL
  Filled 2019-05-31: qty 2

## 2019-05-31 MED ORDER — SODIUM CHLORIDE 0.9 % IV SOLN
1.0000 g | Freq: Once | INTRAVENOUS | Status: AC
  Administered 2019-05-31: 1 g via INTRAVENOUS
  Filled 2019-05-31: qty 10

## 2019-05-31 MED ORDER — GUAIFENESIN-DM 100-10 MG/5ML PO SYRP
10.0000 mL | ORAL_SOLUTION | ORAL | Status: DC | PRN
  Administered 2019-06-01 – 2019-06-09 (×7): 10 mL via ORAL
  Filled 2019-05-31 (×7): qty 10

## 2019-05-31 MED ORDER — METHYLPREDNISOLONE SODIUM SUCC 125 MG IJ SOLR
125.0000 mg | Freq: Once | INTRAMUSCULAR | Status: AC
  Administered 2019-05-31: 125 mg via INTRAVENOUS
  Filled 2019-05-31: qty 2

## 2019-05-31 MED ORDER — ENOXAPARIN SODIUM 40 MG/0.4ML ~~LOC~~ SOLN: 40.0000 mg | SUBCUTANEOUS | Status: DC

## 2019-05-31 NOTE — ED Provider Notes (Signed)
[place] DEPT Provider Note   CSN: [id number] Arrival date & time: 05/31/19  0757     History   Chief Complaint Chief Complaint  Patient presents with  . COVID +    HPI Andrea [name] is a 73 y.o. female.     HPI   73 year old female with a past medical history significant for type 2 diabetes mellitus, chronic respiratory failure in setting of COPD, on 2 L of supplemental oxygen at home following prior CABG, hypertension, hyperlipidemia, chronic kidney disease stage III with a baseline creatinine of 1.6-1.9, pulmonary embolism remote not on anticoagulation, CAD, recent admission for COVID 19 11/19, discharged 11/24 after treatment with decadron and remdesivir who presents with shortness of breath, found to have worsening hypoxia.   Reports that she felt she was improving some from her COVID19 infection initially, living at home and quarantining, however today she went to the bathroom and acutely felt more short of breath.  Has had continued productive cough.  No chest pain.  Checked her pulse oximeter at home and found to be in 70s, turned up O2 with improvement. No n/v/d. Has continued to have somewhat low appetite.  Tried to sit down and take her inhaler after she felt short of breath and it did not help.  Did feel better after increasing O2.  No leg pain or swelling.   Past Medical History:  Diagnosis Date  . COPD (chronic obstructive pulmonary disease) ([place])   . Diabetes mellitus without complication ([place])   . Heart attack ([place])   . Renal disorder     Patient Active Problem List   Diagnosis Date Noted  . HCAP (healthcare-associated pneumonia) 05/31/2019  . Pancytopenia ([place]) 05/17/2019  . Sepsis ([place]) 05/16/2019  . Fever 05/16/2019  . Pleural effusion with transudate 09/09/2018  . Pulmonary embolism on right ([place]) 09/09/2018  . DOE (dyspnea on exertion) 08/08/2018  . 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 Acute bronchitis with chronic  obstructive pulmonary disease (COPD) ([place]) 11/15/2017  . Pneumonia 09/22/2017  . H/O solitary pulmonary nodule 04/14/2014  . Chronic right hip pain 09/01/2013  . Peptic ulcer symptoms 04/04/2013  . GERD (gastroesophageal reflux disease) 10/22/2012  . Atherosclerotic heart disease of native coronary artery without angina pectoris 10/11/2012  . Malignant neoplasm of female breast ([place]) 02/16/2011  . H/O TIA (transient ischemic attack) and stroke 11/01/2009  . Chronic kidney disease 02/23/2009  . Hypertension, benign 11/26/2008    Past Surgical History:  Procedure Laterality Date  . ABDOMINAL HYSTERECTOMY    . CARDIAC SURGERY     stents     OB History   No obstetric history on file.      Home Medications    Prior to Admission medications   Medication Sig Start Date End Date Taking? Authorizing Provider  albuterol (PROVENTIL HFA;VENTOLIN HFA) 108 (90 Base) MCG/ACT inhaler Inhale 2 puffs into the lungs every 6 (six) hours as needed for wheezing or shortness of breath. 11/18/17  Yes Wieting, Richard, MD  amLODipine (NORVASC) 10 MG tablet Take 10 mg by mouth daily.  09/12/18  Yes [provider]  aspirin EC 81 MG tablet Take 81 mg by mouth daily.   Yes [provider]  atorvastatin (LIPITOR) 40 MG tablet Take 40 mg by mouth at bedtime. 10/09/17  Yes [provider]  budesonide-formoterol (SYMBICORT) 160-4.5 MCG/ACT inhaler Inhale 2 puffs into the lungs 2 (two) times daily.  06/28/16  Yes [provider]  chlorpheniramine-HYDROcodone ([place]) 10-8  MG/5ML SUER Take 5 mLs by mouth every 12 (twelve) hours as needed for cough. 05/20/19  Yes Sheikh, Omair Latif, DO  Cholecalciferol (VITAMIN D3) 1000 units CAPS Take 1 capsule by mouth daily.   Yes [provider]  ezetimibe (ZETIA) 10 MG tablet Take 10 mg by mouth daily.  04/28/19 04/27/20 Yes [provider]  fluticasone (FLONASE) 50 MCG/ACT nasal spray Place 1 spray into both nostrils daily.   01/03/17 05/31/19 Yes [provider]  furosemide (LASIX) 20 MG tablet Take 40 mg by mouth daily.  12/04/18  Yes [provider]  glipiZIDE (GLUCOTROL) 10 MG tablet Take 10 mg by mouth 2 (two) times daily before a meal.  03/19/17  Yes [provider]  JANUVIA 50 MG tablet Take 50 mg by mouth daily.  11/20/18  Yes [provider]  metoprolol succinate (TOPROL-XL) 25 MG 24 hr tablet Take 25 mg by mouth daily.  11/15/16  Yes [provider]  Multiple Vitamin (MULTI-VITAMINS) TABS Take 1 tablet by mouth daily.   Yes [provider]  nitroGLYCERIN (NITROSTAT) 0.4 MG SL tablet Place 0.4 mg under the tongue every 5 (five) minutes as needed for chest pain.  01/19/17  Yes [provider]  omeprazole (PRILOSEC) 20 MG capsule Take 1 capsule by mouth daily. 07/04/17  Yes [provider]  tiotropium (SPIRIVA HANDIHALER) 18 MCG inhalation capsule Place 18 mcg into inhaler and inhale daily.  08/01/17  Yes [provider]    Family History No family history on file.  Social History Social History   Tobacco Use  . Smoking status: Never Smoker  . Smokeless tobacco: Never Used  Substance Use Topics  . Alcohol use: No  . Drug use: No     Allergies   Metformin and related and Sulfur   Review of Systems Review of Systems  Constitutional: Positive for appetite change and fever.  Respiratory: Positive for cough and shortness of breath.   Cardiovascular: Negative for chest pain and leg swelling.  Gastrointestinal: Negative for abdominal pain, nausea and vomiting.  Musculoskeletal: Negative for back pain.  Skin: Negative for rash.  Neurological: Negative for headaches.     Physical Exam Updated Vital Signs BP (!) 133/45   Pulse 82   Temp 98.5 F (36.9 C) (Oral)   Resp 18   Ht 5\' 2"  (1.575 m)   Wt 68 kg   SpO2 100%   BMI 27.44 kg/m   Physical Exam Vitals signs and nursing note reviewed.  Constitutional:       General: She is not in acute distress.    Appearance: She is well-developed. She is not diaphoretic.  HENT:     Head: Normocephalic and atraumatic.  Eyes:     Conjunctiva/sclera: Conjunctivae normal.  Neck:     Musculoskeletal: Normal range of motion.  Cardiovascular:     Rate and Rhythm: Normal rate and regular rhythm.     Heart sounds: Normal heart sounds. No murmur. No friction rub. No gallop.   Pulmonary:     Effort: Pulmonary effort is normal. No respiratory distress.     Breath sounds: Rales (left) present. No wheezing.  Abdominal:     General: There is no distension.     Palpations: Abdomen is soft.     Tenderness: There is no abdominal tenderness. There is no guarding.  Musculoskeletal:        General: No tenderness.  Skin:    General: Skin is warm and dry.  Findings: No erythema or rash.  Neurological:     Mental Status: She is alert and oriented to person, place, and time.      ED Treatments / Results  Labs (all labs ordered are listed, but only abnormal results are displayed) Labs Reviewed  CBC WITH DIFFERENTIAL/PLATELET - Abnormal; Notable for the following components:      Result Value   WBC 11.8 (*)    RBC 3.72 (*)    Hemoglobin 10.9 (*)    HCT 34.3 (*)    Platelets 100 (*)    Neutro Abs 10.4 (*)    All other components within normal limits  COMPREHENSIVE METABOLIC PANEL - Abnormal; Notable for the following components:   Potassium 3.4 (*)    Chloride 96 (*)    Glucose, Bld 199 (*)    BUN 48 (*)    Creatinine, Ser 1.93 (*)    Calcium 8.6 (*)    Albumin 3.1 (*)    GFR calc non Af Amer 25 (*)    GFR calc Af Amer 29 (*)    All other components within normal limits  BRAIN NATRIURETIC PEPTIDE - Abnormal; Notable for the following components:   B Natriuretic Peptide 395.7 (*)    All other components within normal limits  D-DIMER, QUANTITATIVE (NOT AT [facility]) - Abnormal; Notable for the following components:   D-Dimer, Quant 2.51 (*)    All other  components within normal limits  BRAIN NATRIURETIC PEPTIDE - Abnormal; Notable for the following components:   B Natriuretic Peptide 335.4 (*)    All other components within normal limits  C-REACTIVE PROTEIN - Abnormal; Notable for the following components:   CRP 15.8 (*)    All other components within normal limits  FERRITIN - Abnormal; Notable for the following components:   Ferritin 339 (*)    All other components within normal limits  LACTATE DEHYDROGENASE - Abnormal; Notable for the following components:   LDH 216 (*)    All other components within normal limits  TROPONIN I (HIGH SENSITIVITY) - Abnormal; Notable for the following components:   Troponin I (High Sensitivity) 36 (*)    All other components within normal limits  TROPONIN I (HIGH SENSITIVITY) - Abnormal; Notable for the following components:   Troponin I (High Sensitivity) 34 (*)    All other components within normal limits  CULTURE, BLOOD (ROUTINE X 2)  CULTURE, BLOOD (ROUTINE X 2)  RESPIRATORY PANEL BY PCR  LACTIC ACID, PLASMA  LACTIC ACID, PLASMA  PROCALCITONIN  INFLUENZA PANEL BY PCR (TYPE A & B)  D-DIMER, QUANTITATIVE (NOT AT [facility])  CBC WITH DIFFERENTIAL/PLATELET  COMPREHENSIVE METABOLIC PANEL  C-REACTIVE PROTEIN  MAGNESIUM  FERRITIN  PHOSPHORUS    EKG None  Radiology Dg Chest Portable 1 View  Result Date: 05/31/2019 CLINICAL DATA:  Shortness of breath, cough, headache. Diagnosed with COVID on 11/20. EXAM: PORTABLE CHEST 1 VIEW COMPARISON:  05/20/2019 FINDINGS: Mild patchy opacity in the lingula and bilateral lower lobes. No pleural effusion or pneumothorax. The heart is normal in size. Postsurgical changes related to prior CABG. Left vascular stent. Median sternotomy. Surgical clips overlying the right breast/lateral chest wall. IMPRESSION: Mild patchy opacities in the lingula and bilateral lower lobes, suspicious for pneumonia. Electronically Signed   By: Julian Hy M.D.   On: 05/31/2019 11:24     Procedures Procedures (including critical care time)  Medications Ordered in ED Medications  methylPREDNISolone sodium succinate (SOLU-MEDROL) 40 mg/mL injection 34 mg (has no administration in time  range)  Ipratropium-Albuterol (COMBIVENT) respimat 1 puff (1 puff Inhalation Given 05/31/19 1653)  vitamin C (ASCORBIC ACID) tablet 500 mg (500 mg Oral Given 05/31/19 1656)  zinc sulfate capsule 220 mg (220 mg Oral Given 05/31/19 1656)  guaiFENesin-dextromethorphan (ROBITUSSIN DM) 100-10 MG/5ML syrup 10 mL (has no administration in time range)  chlorpheniramine-HYDROcodone (TUSSIONEX) 10-8 MG/5ML suspension 5 mL (has no administration in time range)  heparin injection 5,000 Units (5,000 Units Subcutaneous Given 05/31/19 1652)  methylPREDNISolone sodium succinate (SOLU-MEDROL) 125 mg/2 mL injection 125 mg (125 mg Intravenous Given 05/31/19 1010)  cefTRIAXone (ROCEPHIN) 1 g in sodium chloride 0.9 % 100 mL IVPB (0 g Intravenous Stopped 05/31/19 1340)  azithromycin (ZITHROMAX) 500 mg in sodium chloride 0.9 % 250 mL IVPB (0 mg Intravenous Stopped 05/31/19 1512)  potassium chloride SA (KLOR-CON) CR tablet 40 mEq (40 mEq Oral Given 05/31/19 1654)     Initial Impression / Assessment and Plan / ED Course  I have reviewed the triage vital signs and the nursing notes.  Pertinent labs & imaging results that were available during my care of the patient were reviewed by me and considered in my medical decision making (see chart for details).        73 year old female with a past medical history significant for type 2 diabetes mellitus, chronic respiratory failure in setting of COPD, on 2 L of supplemental oxygen at home following prior CABG, hypertension, hyperlipidemia, chronic kidney disease stage III with a baseline creatinine of 1.6-1.9, pulmonary embolism remote not on anticoagulation, CAD, recent admission for COVID 19 11/19, discharged 11/24 after treatment with decadron and remdesivir who presents with  shortness of breath, found to have worsening hypoxia.  CXR shows mild patchy opacities in the lingula and bilateral lower lobes suspicious for pneumonia. Given rocephin/azithromycin.  Ordered CTA however GFR not high enough for contrast.  Overall doubt COPD exacerbation although did give solumedrol. No signs of CHF exacerbation.  Troponin mild elevation, no significant ECG changes, did not upload into MUSE.  Discussed with hospitalist regarding known COVID positive, hx of prior PE, unable to obtain CT PE study at this time, will admit, add ddimer to determinte next steps (empiric AC vs VQ  Admitted for COVID 19, pneumonia, hypoxia.   Final Clinical Impressions(s) / ED Diagnoses   Final diagnoses:  Community acquired pneumonia, unspecified laterality  COVID-19  Hypoxia    ED Discharge Orders    None       Gareth Morgan, MD 05/31/19 2032

## 2019-05-31 NOTE — ED Triage Notes (Signed)
Patient arrived from home c/o SOB, cough, and headache. Patient wears 2L O2 at home and was in the 70s for SPO2. Patient currently on 3L [place] now and is 99%. Patient was diagnosed with covid on 11/20 and was hospitalized for it here until 11/24. Patient reports coughing up green sputum.   99% 3L [place] 83 HR 15 R 141/53 (79) 98.5 temp

## 2019-05-31 NOTE — H&P (Signed)
History and Physical    Andrea [name] [id number] DOB: 1946/02/14 DOA: 05/31/2019  PCP: Chester Holstein, MD Patient coming from: Home Chief Complaint: Shortness of breath  HPI: Andrea [name] is a 73 y.o. female with medical history significant of recent Covid +11/19 discharged from the hospital 1124 after treatment with Decadron and remdesivir readmitted today with hypoxia.  Her sats was 70s on 2 L.  She has oxygen at home since February 2020 since she had cardiac surgery.  She is on 2 L at home.   She has history of type 2 diabetes, chronic respiratory failure secondary to COPD O2 dependent, hypertension, hyperlipidemia, CKD stage III, pulmonary embolism, CAD. Marland Kitchen  She reports subjective fever shortness of breath breath  productive cough. She denies any chest pain.  She denies any nausea vomiting diarrhea or urinary complaints.  She lives at home with her family.  ED Course: Vital signs 1 3756, pulse 77, respiration 18, sats 99% on 3 L.  Review of Systems: As per HPI otherwise all other systems reviewed and are negative  Ambulatory Status she is ambulatory at baseline  Past Medical History:  Diagnosis Date  . COPD (chronic obstructive pulmonary disease) ([place])   . Diabetes mellitus without complication ([place])   . Heart attack ([place])   . Renal disorder     Past Surgical History:  Procedure Laterality Date  . ABDOMINAL HYSTERECTOMY    . CARDIAC SURGERY     stents    Social History   Socioeconomic History  . Marital status: Divorced    Spouse name: Not on file  . Number of children: Not on file  . Years of education: Not on file  . Highest education level: Not on file  Occupational History  . Not on file  Social Needs  . Financial resource strain: Not on file  . Food insecurity    Worry: Not on file    Inability: Not on file  . Transportation needs    Medical: Not on file    Non-medical: Not on file  Tobacco Use  . Smoking status: Never Smoker  . Smokeless  tobacco: Never Used  Substance and Sexual Activity  . Alcohol use: No  . Drug use: No  . Sexual activity: Not on file  Lifestyle  . Physical activity    Days per week: Not on file    Minutes per session: Not on file  . Stress: Not on file  Relationships  . Social Herbalist on phone: Not on file    Gets together: Not on file    Attends religious service: Not on file    Active member of club or organization: Not on file    Attends meetings of clubs or organizations: Not on file    Relationship status: Not on file  . Intimate partner violence    Fear of current or ex partner: Not on file    Emotionally abused: Not on file    Physically abused: Not on file    Forced sexual activity: Not on file  Other Topics Concern  . Not on file  Social History Narrative  . Not on file    Allergies  Allergen Reactions  . Metformin And Related Diarrhea  . Sulfur Hives    No family history on file.    Prior to Admission medications   Medication Sig Start Date End Date Taking? Authorizing Provider  albuterol (PROVENTIL HFA;VENTOLIN HFA) 108 (90 Base) MCG/ACT inhaler Inhale 2  puffs into the lungs every 6 (six) hours as needed for wheezing or shortness of breath. 11/18/17  Yes Wieting, Richard, MD  amLODipine (NORVASC) 10 MG tablet Take 10 mg by mouth daily.  09/12/18  Yes [provider]  aspirin EC 81 MG tablet Take 81 mg by mouth daily.   Yes [provider]  atorvastatin (LIPITOR) 40 MG tablet Take 40 mg by mouth at bedtime. 10/09/17  Yes [provider]  budesonide-formoterol (SYMBICORT) 160-4.5 MCG/ACT inhaler Inhale 2 puffs into the lungs 2 (two) times daily.  06/28/16  Yes [provider]  chlorpheniramine-HYDROcodone (TUSSIONEX) 10-8 MG/5ML SUER Take 5 mLs by mouth every 12 (twelve) hours as needed for cough. 05/20/19  Yes Sheikh, Omair Latif, DO  Cholecalciferol (VITAMIN D3) 1000 units CAPS Take 1 capsule by mouth daily.   Yes [provider]  ezetimibe (ZETIA) 10 MG tablet Take 10 mg by mouth daily.  04/28/19 04/27/20 Yes [provider]  fluticasone (FLONASE) 50 MCG/ACT nasal spray Place 1 spray into both nostrils daily.  01/03/17 05/31/19 Yes [provider]  furosemide (LASIX) 20 MG tablet Take 40 mg by mouth daily.  12/04/18  Yes [provider]  glipiZIDE (GLUCOTROL) 10 MG tablet Take 10 mg by mouth 2 (two) times daily before a meal.  03/19/17  Yes [provider]  JANUVIA 50 MG tablet Take 50 mg by mouth daily.  11/20/18  Yes [provider]  metoprolol succinate (TOPROL-XL) 25 MG 24 hr tablet Take 25 mg by mouth daily.  11/15/16  Yes [provider]  Multiple Vitamin (MULTI-VITAMINS) TABS Take 1 tablet by mouth daily.   Yes [provider]  nitroGLYCERIN (NITROSTAT) 0.4 MG SL tablet Place 0.4 mg under the tongue every 5 (five) minutes as needed for chest pain.  01/19/17  Yes [provider]  omeprazole (PRILOSEC) 20 MG capsule Take 1 capsule by mouth daily. 07/04/17  Yes [provider]  tiotropium (SPIRIVA HANDIHALER) 18 MCG inhalation capsule Place 18 mcg into inhaler and inhale daily.  08/01/17  Yes [provider]    Physical Exam: Vitals:   05/31/19 1200 05/31/19 1230 05/31/19 1300 05/31/19 1330  BP: (!) 143/53 (!) 150/50 (!) 159/122 (!) 137/56  Pulse: 79 83 85 77  Resp: 17 17 15 18   Temp:      TempSrc:      SpO2: 100% 99% 99% 99%  Weight:      Height:         . General:  Appears calm and comfortable . Eyes: PERRL, EOMI, normal lids, iris . ENT:  grossly normal hearing, lips & tongue, mmm . Neck:  no LAD, masses or thyromegaly . Cardiovascular:  RRR, no m/r/g. No LE edema.  Marland Kitchen Respiratory: Wheezing and scattered rhonchi bilaterally, no w/r/r. Normal respiratory effort. . Abdomen:  soft, ntnd, NABS . Skin: no rash or induration seen on limited exam . Musculoskeletal: Trace lower extremity edema bilaterally .  Psychiatric:  grossly normal mood and affect, speech fluent and appropriate, AOx3 . Neurologic:  CN 2-12 grossly intact, moves all extremities in coordinated fashion, sensation intact  Labs on Admission: I have personally reviewed following labs and imaging studies  CBC: Recent Labs  Lab 05/31/19 1024  WBC 11.8*  NEUTROABS 10.4*  HGB 10.9*  HCT 34.3*  MCV 92.2  PLT 123XX123*   Basic Metabolic Panel: Recent Labs  Lab 05/31/19 1024  NA 137  K 3.4*  CL 96*  CO2 28  GLUCOSE  199*  BUN 48*  CREATININE 1.93*  CALCIUM 8.6*   GFR: Estimated Creatinine Clearance: 23.8 mL/min (A) (by C-G formula based on SCr of 1.93 mg/dL (H)). Liver Function Tests: Recent Labs  Lab 05/31/19 1024  AST 29  ALT 25  ALKPHOS 62  BILITOT 0.9  PROT 7.1  ALBUMIN 3.1*   No results for input(s): LIPASE, AMYLASE in the last 168 hours. No results for input(s): AMMONIA in the last 168 hours. Coagulation Profile: No results for input(s): INR, PROTIME in the last 168 hours. Cardiac Enzymes: No results for input(s): CKTOTAL, CKMB, CKMBINDEX, TROPONINI in the last 168 hours. BNP (last 3 results) No results for input(s): PROBNP in the last 8760 hours. HbA1C: No results for input(s): HGBA1C in the last 72 hours. CBG: No results for input(s): GLUCAP in the last 168 hours. Lipid Profile: No results for input(s): CHOL, HDL, LDLCALC, TRIG, CHOLHDL, LDLDIRECT in the last 72 hours. Thyroid Function Tests: No results for input(s): TSH, T4TOTAL, FREET4, T3FREE, THYROIDAB in the last 72 hours. Anemia Panel: No results for input(s): VITAMINB12, FOLATE, FERRITIN, TIBC, IRON, RETICCTPCT in the last 72 hours. Urine analysis:    Component Value Date/Time   COLORURINE YELLOW 05/16/2019 0158   APPEARANCEUR CLEAR 05/16/2019 0158   LABSPEC 1.010 05/16/2019 0158   PHURINE 6.0 05/16/2019 0158   GLUCOSEU NEGATIVE 05/16/2019 0158   HGBUR NEGATIVE 05/16/2019 0158   BILIRUBINUR NEGATIVE 05/16/2019 0158   KETONESUR  NEGATIVE 05/16/2019 0158   PROTEINUR 30 (A) 05/16/2019 0158   NITRITE NEGATIVE 05/16/2019 0158   LEUKOCYTESUR TRACE (A) 05/16/2019 0158    Creatinine Clearance: Estimated Creatinine Clearance: 23.8 mL/min (A) (by C-G formula based on SCr of 1.93 mg/dL (H)).  Sepsis Labs: @LABRCNTIP (procalcitonin:4,lacticidven:4) )No results found for this or any previous visit (from the past 240 hour(s)).   Radiological Exams on Admission: Dg Chest Portable 1 View  Result Date: 05/31/2019 CLINICAL DATA:  Shortness of breath, cough, headache. Diagnosed with COVID on 11/20. EXAM: PORTABLE CHEST 1 VIEW COMPARISON:  05/20/2019 FINDINGS: Mild patchy opacity in the lingula and bilateral lower lobes. No pleural effusion or pneumothorax. The heart is normal in size. Postsurgical changes related to prior CABG. Left vascular stent. Median sternotomy. Surgical clips overlying the right breast/lateral chest wall. IMPRESSION: Mild patchy opacities in the lingula and bilateral lower lobes, suspicious for pneumonia. Electronically Signed   By: Julian Hy M.D.   On: 05/31/2019 11:24    EKG:  Assessment/Plan Active Problems:   HCAP (healthcare-associated pneumonia)    #1 acute on chronic hypoxic respiratory failure/HCAP/COPD exacerbation-patient admitted with hypoxia and exertional dyspnea subjective fever shortness of breath and cough.  She is found to have bilateral infiltrates by chest x-ray.  Her sats initially was in the 70s on 2 L now she is on 3 L her saturation is stable. Covid markers D-dimer 2.51 I am going to treat this as healthcare associated pneumonia with Vanco and cefepime. I will also started on steroids for COPD exacerbation. Check respiratory virus panel flu panel Check procalcitonin and lactic acid ?  Post Covid fibrosis-I am not able to get a CT of the chest with contrast due to creatinine of 1.93. X-ray at the time of discharge from last admission there was no evidence of active disease.   Chest x-ray done on admission today shows mild patchy opacities in the lingula and bilateral lower lobe suspicious for pneumonia. Airborne and contact precautions as patient is still infectious within the 21 days..  Her last Covid was +  05/15/2019. BNP is normal  #2 type 2 diabetes SSI hold glipizide and Januvia  #3 hypertension continue Norvasc metoprolol  #4 CKD stage III stable  #5 history of CAD continue aspirin  #6 history of pulmonary embolism in March 2020 not on anticoagulation  #7 hyperlipidemia continue Lipitor and Zetia  #8 mild hypokalemia potassium 3.4 replete  #9 thrombocytopenia platelets 100,000 monitor closely on Lovenox.  Unclear if this is related to Covid.  Continue to monitor closely..  Severity of Illness: The appropriate patient status for this patient is INPATIENT. Inpatient status is judged to be reasonable and necessary in order to provide the required intensity of service to ensure the patient's safety. The patient's presenting symptoms, physical exam findings, and initial radiographic and laboratory data in the context of their chronic comorbidities is felt to place them at high risk for further clinical deterioration. Furthermore, it is not anticipated that the patient will be medically stable for discharge from the hospital within 2 midnights of admission. The following factors support the patient status of inpatient.   " The patient's presenting symptoms include shortness of breath dyspnea on exertion and subjective fever. " The worrisome physical exam findings include bilateral scattered wheezing and rhonchi. " The initial radiographic and laboratory data are worrisome because of bilateral patchy infiltrates. " The chronic co-morbidities include recent COVID-19 CAD diabetes hypertension   * I certify that at the point of admission it is my clinical judgment that the patient will require inpatient hospital care spanning beyond 2 midnights from the point of  admission due to high intensity of service, high risk for further deterioration and high frequency of surveillance required.*   Estimated body mass index is 27.44 kg/m as calculated from the following:   Height as of this encounter: 5\' 2"  (1.575 m).   Weight as of this encounter: 68 kg.   DVT prophylaxis: Lovenox Code Status: Full code Family Communication: None Disposition Plan pending clinical improvement Consults called: None Admission status: Inpatient   Georgette Shell MD [facility]  If 7PM-7AM, please contact night-coverage [email or web address] Password [facility]  05/31/2019, 2:34 PM

## 2019-05-31 NOTE — ED Notes (Signed)
Patient given meal tray at this time.

## 2019-06-01 LAB — CBC WITH DIFFERENTIAL/PLATELET
Abs Immature Granulocytes: 0.03 10*3/uL (ref 0.00–0.07)
Basophils Absolute: 0 10*3/uL (ref 0.0–0.1)
Basophils Relative: 0 %
Eosinophils Absolute: 0 10*3/uL (ref 0.0–0.5)
Eosinophils Relative: 0 %
HCT: 31.9 % — ABNORMAL LOW (ref 36.0–46.0)
Hemoglobin: 10.1 g/dL — ABNORMAL LOW (ref 12.0–15.0)
Immature Granulocytes: 1 %
Lymphocytes Relative: 9 %
Lymphs Abs: 0.6 10*3/uL — ABNORMAL LOW (ref 0.7–4.0)
MCH: 29.5 pg (ref 26.0–34.0)
MCHC: 31.7 g/dL (ref 30.0–36.0)
MCV: 93.3 fL (ref 80.0–100.0)
Monocytes Absolute: 0.1 10*3/uL (ref 0.1–1.0)
Monocytes Relative: 1 %
Neutro Abs: 5.6 10*3/uL (ref 1.7–7.7)
Neutrophils Relative %: 89 %
Platelets: 98 10*3/uL — ABNORMAL LOW (ref 150–400)
RBC: 3.42 MIL/uL — ABNORMAL LOW (ref 3.87–5.11)
RDW: 13.2 % (ref 11.5–15.5)
WBC: 6.2 10*3/uL (ref 4.0–10.5)
nRBC: 0 % (ref 0.0–0.2)

## 2019-06-01 LAB — COMPREHENSIVE METABOLIC PANEL
ALT: 24 U/L (ref 0–44)
AST: 25 U/L (ref 15–41)
Albumin: 2.9 g/dL — ABNORMAL LOW (ref 3.5–5.0)
Alkaline Phosphatase: 58 U/L (ref 38–126)
Anion gap: 15 (ref 5–15)
BUN: 58 mg/dL — ABNORMAL HIGH (ref 8–23)
CO2: 20 mmol/L — ABNORMAL LOW (ref 22–32)
Calcium: 8.6 mg/dL — ABNORMAL LOW (ref 8.9–10.3)
Chloride: 96 mmol/L — ABNORMAL LOW (ref 98–111)
Creatinine, Ser: 2.05 mg/dL — ABNORMAL HIGH (ref 0.44–1.00)
GFR calc Af Amer: 27 mL/min — ABNORMAL LOW (ref >60)
GFR calc non Af Amer: 24 mL/min — ABNORMAL LOW (ref >60)
Glucose, Bld: 573 mg/dL (ref 70–99)
Potassium: 4.5 mmol/L (ref 3.5–5.1)
Sodium: 131 mmol/L — ABNORMAL LOW (ref 135–145)
Total Bilirubin: 1.1 mg/dL (ref 0.3–1.2)
Total Protein: 6.7 g/dL (ref 6.5–8.1)

## 2019-06-01 LAB — MRSA PCR SCREENING: MRSA by PCR: NEGATIVE

## 2019-06-01 LAB — BASIC METABOLIC PANEL
Anion gap: 15 (ref 5–15)
Anion gap: 11 (ref 5–15)
BUN: 64 mg/dL — ABNORMAL HIGH (ref 8–23)
BUN: 62 mg/dL — ABNORMAL HIGH (ref 8–23)
CO2: 20 mmol/L — ABNORMAL LOW (ref 22–32)
CO2: 21 mmol/L — ABNORMAL LOW (ref 22–32)
Calcium: 8.4 mg/dL — ABNORMAL LOW (ref 8.9–10.3)
Calcium: 8.5 mg/dL — ABNORMAL LOW (ref 8.9–10.3)
Chloride: 96 mmol/L — ABNORMAL LOW (ref 98–111)
Chloride: 106 mmol/L (ref 98–111)
Creatinine, Ser: 2.07 mg/dL — ABNORMAL HIGH (ref 0.44–1.00)
Creatinine, Ser: 1.69 mg/dL — ABNORMAL HIGH (ref 0.44–1.00)
GFR calc Af Amer: 27 mL/min — ABNORMAL LOW (ref >60)
GFR calc Af Amer: 35 mL/min — ABNORMAL LOW (ref >60)
GFR calc non Af Amer: 23 mL/min — ABNORMAL LOW (ref >60)
GFR calc non Af Amer: 30 mL/min — ABNORMAL LOW (ref >60)
Glucose, Bld: 714 mg/dL (ref 70–99)
Glucose, Bld: 245 mg/dL — ABNORMAL HIGH (ref 70–99)
Potassium: 4.1 mmol/L (ref 3.5–5.1)
Potassium: 3.6 mmol/L (ref 3.5–5.1)
Sodium: 131 mmol/L — ABNORMAL LOW (ref 135–145)
Sodium: 138 mmol/L (ref 135–145)

## 2019-06-01 LAB — GLUCOSE, CAPILLARY
Glucose-Capillary: 505 mg/dL (ref 70–99)
Glucose-Capillary: >600 mg/dL (ref 70–99)
Glucose-Capillary: 457 mg/dL — ABNORMAL HIGH (ref 70–99)
Glucose-Capillary: 498 mg/dL — ABNORMAL HIGH (ref 70–99)
Glucose-Capillary: 400 mg/dL — ABNORMAL HIGH (ref 70–99)
Glucose-Capillary: 309 mg/dL — ABNORMAL HIGH (ref 70–99)
Glucose-Capillary: 223 mg/dL — ABNORMAL HIGH (ref 70–99)
Glucose-Capillary: 165 mg/dL — ABNORMAL HIGH (ref 70–99)
Glucose-Capillary: 159 mg/dL — ABNORMAL HIGH (ref 70–99)
Glucose-Capillary: 164 mg/dL — ABNORMAL HIGH (ref 70–99)
Glucose-Capillary: 174 mg/dL — ABNORMAL HIGH (ref 70–99)
Glucose-Capillary: 179 mg/dL — ABNORMAL HIGH (ref 70–99)

## 2019-06-01 LAB — PHOSPHORUS: Phosphorus: 4.1 mg/dL (ref 2.5–4.6)

## 2019-06-01 LAB — MAGNESIUM: Magnesium: 2.8 mg/dL — ABNORMAL HIGH (ref 1.7–2.4)

## 2019-06-01 LAB — FERRITIN: Ferritin: 351 ng/mL — ABNORMAL HIGH (ref 11–307)

## 2019-06-01 LAB — D-DIMER, QUANTITATIVE: D-Dimer, Quant: 1.89 ug/mL-FEU — ABNORMAL HIGH (ref 0.00–0.50)

## 2019-06-01 LAB — C-REACTIVE PROTEIN: CRP: 15 mg/dL — ABNORMAL HIGH (ref <1.0)

## 2019-06-01 MED ORDER — DEXTROSE 50 % IV SOLN: 0.0000 mL | INTRAVENOUS | Status: DC | PRN

## 2019-06-01 MED ORDER — LINAGLIPTIN 5 MG PO TABS
5.0000 mg | ORAL_TABLET | Freq: Every day | ORAL | Status: DC
  Administered 2019-06-01 – 2019-06-10 (×10): 5 mg via ORAL
  Filled 2019-06-01 (×10): qty 1

## 2019-06-01 MED ORDER — SODIUM CHLORIDE 0.9 % IV SOLN
INTRAVENOUS | Status: DC
  Administered 2019-06-01: 13:00:00 via INTRAVENOUS

## 2019-06-01 MED ORDER — INSULIN REGULAR(HUMAN) IN NACL 100-0.9 UT/100ML-% IV SOLN
INTRAVENOUS | Status: DC
  Administered 2019-06-01: 8.5 [IU]/h via INTRAVENOUS
  Filled 2019-06-01 (×2): qty 100

## 2019-06-01 MED ORDER — DEXTROSE-NACL 5-0.45 % IV SOLN
INTRAVENOUS | Status: DC
  Administered 2019-06-01: 17:00:00 via INTRAVENOUS

## 2019-06-01 MED ORDER — SODIUM CHLORIDE 0.9 % IV SOLN
2.0000 g | Freq: Every day | INTRAVENOUS | Status: DC
  Administered 2019-06-01 – 2019-06-04 (×4): 2 g via INTRAVENOUS
  Filled 2019-06-01 (×4): qty 2

## 2019-06-01 MED ORDER — VANCOMYCIN HCL 10 G IV SOLR
1500.0000 mg | Freq: Once | INTRAVENOUS | Status: AC
  Administered 2019-06-01: 1500 mg via INTRAVENOUS
  Filled 2019-06-01: qty 1500

## 2019-06-01 MED ORDER — VANCOMYCIN HCL IN DEXTROSE 1-5 GM/200ML-% IV SOLN: 1000.0000 mg | INTRAVENOUS | Status: DC

## 2019-06-01 MED ORDER — INSULIN GLARGINE 100 UNIT/ML ~~LOC~~ SOLN
15.0000 [IU] | Freq: Once | SUBCUTANEOUS | Status: AC
  Administered 2019-06-01: 23:00:00 15 [IU] via SUBCUTANEOUS
  Filled 2019-06-01: qty 0.15

## 2019-06-01 MED ORDER — INSULIN ASPART 100 UNIT/ML ~~LOC~~ SOLN
0.0000 [IU] | Freq: Three times a day (TID) | SUBCUTANEOUS | Status: DC
  Administered 2019-06-01: 15 [IU] via SUBCUTANEOUS

## 2019-06-01 MED ORDER — SODIUM CHLORIDE 0.9 % IV SOLN: INTRAVENOUS | Status: DC

## 2019-06-01 MED ORDER — INSULIN ASPART 100 UNIT/ML ~~LOC~~ SOLN: 0.0000 [IU] | Freq: Every day | SUBCUTANEOUS | Status: DC

## 2019-06-01 MED ORDER — CHLORHEXIDINE GLUCONATE CLOTH 2 % EX PADS
6.0000 | MEDICATED_PAD | Freq: Every day | CUTANEOUS | Status: DC
  Administered 2019-06-01 – 2019-06-05 (×5): 6 via TOPICAL

## 2019-06-01 MED ORDER — INSULIN ASPART 100 UNIT/ML ~~LOC~~ SOLN
0.0000 [IU] | SUBCUTANEOUS | Status: DC
  Administered 2019-06-02: 3 [IU] via SUBCUTANEOUS
  Administered 2019-06-02 (×2): 4 [IU] via SUBCUTANEOUS
  Administered 2019-06-02: 3 [IU] via SUBCUTANEOUS
  Administered 2019-06-02: 05:00:00 4 [IU] via SUBCUTANEOUS
  Administered 2019-06-02: 11 [IU] via SUBCUTANEOUS
  Administered 2019-06-02: 01:00:00 4 [IU] via SUBCUTANEOUS

## 2019-06-01 MED ORDER — ORAL CARE MOUTH RINSE
15.0000 mL | Freq: Two times a day (BID) | OROMUCOSAL | Status: DC
  Administered 2019-06-01 – 2019-06-10 (×16): 15 mL via OROMUCOSAL

## 2019-06-01 MED ORDER — LIP MEDEX EX OINT
TOPICAL_OINTMENT | CUTANEOUS | Status: AC
  Administered 2019-06-01: 10:00:00
  Filled 2019-06-01: qty 7

## 2019-06-01 MED ORDER — INSULIN GLARGINE 100 UNIT/ML ~~LOC~~ SOLN
20.0000 [IU] | Freq: Every day | SUBCUTANEOUS | Status: DC
  Administered 2019-06-01: 20 [IU] via SUBCUTANEOUS
  Filled 2019-06-01: qty 0.2

## 2019-06-01 NOTE — Plan of Care (Signed)
Pt becomes SOB w/ ambulation, so she will be using [facility]

## 2019-06-01 NOTE — Progress Notes (Signed)
Pharmacy Antibiotic Note  Andrea [name] is a 73 y.o. female admitted on 05/31/2019 with pneumonia.  Pharmacy has been consulted for vancomycin + cefepime dosing.  Pt presented with SOB. PMH significant for recent COVID PNA + 11/19 and was hospitalized. Pt discharged from hospital on 11/24 after receiving course of remdesivir. Pt also has COPD on home oxygen. Pt initiated on azithromycin + CTX, now being transitioned to broad spectrum antibiotics initiated for PNA.  Today, 06/01/19  WBC WNL. Pt also on IV steroids.  SCr 2.05, CrCl ~ 22 mL/min. History of CKD.SCr appears to be ~baseline  PCT <0.10  Afebrile  Plan:  Cefepime 2 g IV q24h  Vancomycin 1500 mg LD followed by 1000 mg IV q48h  Goal vancomycin AUC 400-550. Check vancomycin levels once at steady state as indicated  Follow renal function, culture data  Recommend checking MRSA PCR  Height: 5\' 2"  (157.5 cm) Weight: 149 lb 12.8 oz (67.9 kg) IBW/kg (Calculated) : 50.1  Temp (24hrs), Avg:97.9 F (36.6 C), Min:97.8 F (36.6 C), Max:98 F (36.7 C)  Recent Labs  Lab 05/31/19 1024 05/31/19 1141 05/31/19 1341 06/01/19 0447 06/01/19 0830  WBC 11.8*  --   --  6.2  --   CREATININE 1.93*  --   --   --  2.05*  LATICACIDVEN  --  1.2 1.0  --   --     Estimated Creatinine Clearance: 22.4 mL/min (A) (by C-G formula based on SCr of 2.05 mg/dL (H)).    Allergies  Allergen Reactions  . Metformin And Related Diarrhea  . Sulfur Hives   Antimicrobials this admission: azithromycin 12/5 once dose ceftriaxone 12/5 one dose Cefepime 12/6 >> Vancomycin 12/6 >>  Dose adjustments this admission:  Microbiology results: 12/5 BCx: ngtd Respiratory panel: Sent  Thank you for allowing pharmacy to be a part of this patient's care.  Lenis Noon, PharmD 06/01/2019 9:50 AM

## 2019-06-01 NOTE — Progress Notes (Signed)
PROGRESS NOTE    Andrea [name]  [id number] DOB: 04/17/1946 DOA: 05/31/2019 PCP: Chester Holstein, MD   Brief Narrative: 73 year old female with history of COPD, diabetes CKD,Stage III history of pulmonary embolism, CAD, chronic hypoxic respiratory failure, hypertension with recent diagnosis of Covid 11/19 discharged from hospital 11/24 after treatment with Decadron and remdesivir comes to the ER with subjective fever, shortness of breath productive cough. In the ER, vitals are stable with oxygen saturation 90% on 3 L nasal cannula labs showed CRP 14.8, lactic acid 1.0, pro-Cal less than 0.1, D-dimer 2.5. CXR 'Mild patchy opacities in the lingula and bilateral lower lobes, suspicious for pneumonia" Patient was admitted for acute on chronic hypoxia/HCAP/COPD exacerbation  Subjective: Seen and examined this morning. Reports she is dyspneic only when she is standing and moving otherwise no shortness of breath.  Complains of sugars running high  upto high 300s even at home.  This morning at 500-600.  Assessment & Plan:   HCAP with recent COVID-19 pneumonia: X-ray with mild patchy opacities in the lingula and bilateral lower lobes.  Patient had completed remdesivir/Decadron ON 11/24,?  Post Covid fibrosis-unable to obtain CTA chest due to CKD. Currently afebrile, wbc at 6.2kCRP 15.8->15, lactic acid 1.0, pro-Ca < 0.1, D-dimer 2.5-l.89 > , Ferritin 351.  On 3 to nasal cannula.  Blood culture in process.  We will cont on vancomycin and cefepime given concern for HCAP for now-quickly de-escalate antibiotics once more culture data available.Check nasal  MRSA PCR.  Follow-up respiratory panel. COVID-19 Labs and trends: Recent Labs    05/31/19 1308 05/31/19 1424 06/01/19 0447  DDIMER 2.51*  --  1.89*  FERRITIN  --  339* 351*  LDH  --  216*  --   CRP  --  15.8* 15.0*   Type 2 diabetes mellitus, A1c uncontrolled 8.9 on 11/20 with uncontrolled hyperglycemia.  Blood sugar uncontrolled 505 this  morning-she said seh has not had any meds after friday , also  received steroid her.On last admission she was on 24 u levemir nightly,-gave her 20 units lantus this am and novolog  15 u , added linagliptin 5 mg -Checked bmp. Home OHA on hold. Patient's blood sugar started to get worse further in 600s 700S, ANION GAP IS AT 15, stopped the steroid,stopped insulin sq and started her on insulin drip and will transfer to stepdown unit.  Acute COPD exacerbation: Currently not wheezing, will continue inhalers and hold off Solu-Medrol as not wheezing and also  blood sugar running 600.    Acute on chronic hypoxic respiratory failure due to #1 normally on 2 L nasal cannula at home but has been hypoxic on that, currently at 3 L.  Continue treatment as above.  CKD stage II/IV, Creat 1.9-2.Baseline creatinine 1.6-2.1. Cont ivf,monitor.  HTN/HLD/CAD -CABG 07/2018:on Aspirin, metoprolol, Norvasc.  Patient reports he is on 2 L nasal cannula since her cardiac surgery in February 2020  History of PE in March 2020 not on anticoagulation.  Thrombocytopenia:plt at 100k, unclear if it is due to Covid infection.  Monitor while on Lovenox.  Normocytic anemia in the setting of CKD monitor.  Body mass index is 27.4 kg/m.   DVT prophylaxis:Heparin Code Status: full Family Communication: plan of care discussed with patient at bedside. I called her sister to update - no answer. Then called her granddaughter and updated her all questions answered.  Disposition Plan: Remains inpatient pending clinical improvement in her hypoxia, hyperglycemia.    Consultants: none Procedures:none Microbiology: Blood pending  Antimicrobials: Anti-infectives (From admission, onward)   Start     Dose/Rate Route Frequency Ordered Stop   06/03/19 1000  vancomycin (VANCOCIN) IVPB 1000 mg/200 mL premix     1,000 mg 200 mL/hr over 60 Minutes Intravenous Every 48 hours 06/01/19 0949     06/01/19 1100  ceFEPIme (MAXIPIME) 2 g in sodium  chloride 0.9 % 100 mL IVPB     2 g 200 mL/hr over 30 Minutes Intravenous Daily 06/01/19 0944     06/01/19 1100  vancomycin (VANCOCIN) 1,500 mg in sodium chloride 0.9 % 500 mL IVPB     1,500 mg 250 mL/hr over 120 Minutes Intravenous  Once 06/01/19 0948     05/31/19 1145  cefTRIAXone (ROCEPHIN) 1 g in sodium chloride 0.9 % 100 mL IVPB     1 g 200 mL/hr over 30 Minutes Intravenous  Once 05/31/19 1143 05/31/19 1340   05/31/19 1145  azithromycin (ZITHROMAX) 500 mg in sodium chloride 0.9 % 250 mL IVPB     500 mg 250 mL/hr over 60 Minutes Intravenous  Once 05/31/19 1143 05/31/19 1512       Objective: Vitals:   06/01/19 0102 06/01/19 0445 06/01/19 1148 06/01/19 1400  BP: (!) 151/53 (!) 129/51 (!) 134/57   Pulse: 71 75 (!) 102   Resp: 14 20    Temp: 98 F (36.7 C) 97.8 F (36.6 C) 98.9 F (37.2 C)   TempSrc: Oral Oral Oral   SpO2: 99% 97% 95% 96%  Weight: 67.9 kg     Height: 5\' 2"  (1.575 m)      No intake or output data in the 24 hours ending 06/01/19 1406 Filed Weights   05/31/19 0847 06/01/19 0102  Weight: 68 kg 67.9 kg   Weight change:   Body mass index is 27.4 kg/m.  Intake/Output from previous day: No intake/output data recorded. Intake/Output this shift: No intake/output data recorded.  Examination:  General exam: AAO,NAD,thin and frail and weak appearing,not in acute distress. HEENT:Oral mucosa moist, Ear/Nose WNL grossly, dentition normal. Respiratory system: Diminished at the base, no wheezing or crackles,no use of accessory muscle Cardiovascular system: S1 & S2 +, No JVD,. Gastrointestinal system: Abdomen soft, NT,ND, BS+ Nervous System:Alert, awake, moving extremities and grossly nonfocal Extremities: No edema, distal peripheral pulses palpable.  Skin: No rashes,no icterus. MSK: Normal muscle bulk,tone, power  Medications:  Scheduled Meds: . Chlorhexidine Gluconate Cloth  6 each Topical Daily  . heparin injection (subcutaneous)  5,000 Units Subcutaneous  Q8H  . Ipratropium-Albuterol  1 puff Inhalation Q6H  . linagliptin  5 mg Oral Daily  . mouth rinse  15 mL Mouth Rinse BID  . vitamin C  500 mg Oral Daily  . zinc sulfate  220 mg Oral Daily   Continuous Infusions: . sodium chloride 125 mL/hr at 06/01/19 1249  . sodium chloride Stopped (06/01/19 1250)  . ceFEPime (MAXIPIME) IV 2 g (06/01/19 1252)  . dextrose 5 % and 0.45% NaCl Stopped (06/01/19 1250)  . insulin 8.5 Units/hr (06/01/19 1354)  . vancomycin 1,500 mg (06/01/19 1318)  . [START ON 06/03/2019] vancomycin      Data Reviewed: I have personally reviewed following labs and imaging studies  CBC: Recent Labs  Lab 05/31/19 1024 06/01/19 0447  WBC 11.8* 6.2  NEUTROABS 10.4* 5.6  HGB 10.9* 10.1*  HCT 34.3* 31.9*  MCV 92.2 93.3  PLT 100* 98*   Basic Metabolic Panel: Recent Labs  Lab 05/31/19 1024 06/01/19 0447 06/01/19 0830 06/01/19 1101  NA 137  --  131* 131*  K 3.4*  --  4.5 4.1  CL 96*  --  96* 96*  CO2 28  --  20* 20*  GLUCOSE 199*  --  573* 714*  BUN 48*  --  58* 64*  CREATININE 1.93*  --  2.05* 2.07*  CALCIUM 8.6*  --  8.6* 8.4*  MG  --  2.8*  --   --   PHOS  --  4.1  --   --    GFR: Estimated Creatinine Clearance: 22.2 mL/min (A) (by C-G formula based on SCr of 2.07 mg/dL (H)). Liver Function Tests: Recent Labs  Lab 05/31/19 1024 06/01/19 0830  AST 29 25  ALT 25 24  ALKPHOS 62 58  BILITOT 0.9 1.1  PROT 7.1 6.7  ALBUMIN 3.1* 2.9*   No results for input(s): LIPASE, AMYLASE in the last 168 hours. No results for input(s): AMMONIA in the last 168 hours. Coagulation Profile: No results for input(s): INR, PROTIME in the last 168 hours. Cardiac Enzymes: No results for input(s): CKTOTAL, CKMB, CKMBINDEX, TROPONINI in the last 168 hours. BNP (last 3 results) No results for input(s): PROBNP in the last 8760 hours. HbA1C: No results for input(s): HGBA1C in the last 72 hours. CBG: Recent Labs  Lab 06/01/19 0820 06/01/19 1142 06/01/19 1346  GLUCAP  505* >600* 498*   Lipid Profile: No results for input(s): CHOL, HDL, LDLCALC, TRIG, CHOLHDL, LDLDIRECT in the last 72 hours. Thyroid Function Tests: No results for input(s): TSH, T4TOTAL, FREET4, T3FREE, THYROIDAB in the last 72 hours. Anemia Panel: Recent Labs    05/31/19 1424 06/01/19 0447  FERRITIN 339* 351*   Sepsis Labs: Recent Labs  Lab 05/31/19 1141 05/31/19 1341 05/31/19 1424  PROCALCITON  --   --  <0.10  LATICACIDVEN 1.2 1.0  --     Recent Results (from the past 240 hour(s))  Blood culture (routine x 2)     Status: None (Preliminary result)   Collection Time: 05/31/19 11:41 AM   Specimen: Right Antecubital; Blood  Result Value Ref Range Status   Specimen Description   Final    RIGHT ANTECUBITAL Performed at [facility], [place] [address], [place], [place] [address]    Special Requests   Final    BOTTLES DRAWN AEROBIC AND ANAEROBIC Blood Culture adequate volume Performed at [facility] [address], [place], [place] [address]    Culture   Final    NO GROWTH < 24 HOURS Performed at [facility] [address], [place], [place] [address]    Report Status PENDING  Incomplete  Blood culture (routine x 2)     Status: None (Preliminary result)   Collection Time: 05/31/19 11:46 AM   Specimen: Left Antecubital; Blood  Result Value Ref Range Status   Specimen Description   Final    LEFT ANTECUBITAL Performed at [facility] [address] [place], [place] [address]    Special Requests   Final    BOTTLES DRAWN AEROBIC AND ANAEROBIC Blood Culture adequate volume Performed at [facility] [address] [place], [place] [address]    Culture   Final    NO GROWTH < 24 HOURS Performed at [facility] [address], [place], [place] [address]    Report Status PENDING  Incomplete      Radiology Studies: Dg Chest Portable 1 View  Result Date: 05/31/2019 CLINICAL DATA:   Shortness of breath, cough, headache. Diagnosed  with COVID on 11/20. EXAM: PORTABLE CHEST 1 VIEW COMPARISON:  05/20/2019 FINDINGS: Mild patchy opacity in the lingula and bilateral lower lobes. No pleural effusion or pneumothorax. The heart is normal in size. Postsurgical changes related to prior CABG. Left vascular stent. Median sternotomy. Surgical clips overlying the right breast/lateral chest wall. IMPRESSION: Mild patchy opacities in the lingula and bilateral lower lobes, suspicious for pneumonia. Electronically Signed   By: Julian Hy M.D.   On: 05/31/2019 11:24      LOS: 1 day   Time spent: More than 50% of that time was spent in counseling and/or coordination of care.  Antonieta Pert, MD [facility]  06/01/2019, 2:06 PM

## 2019-06-01 NOTE — Progress Notes (Signed)
PHARMACY - PHYSICIAN COMMUNICATION CRITICAL VALUE ALERT - BLOOD CULTURE IDENTIFICATION (BCID)  Andrea [name] is an 73 y.o. female who presented to [facility] on 05/31/2019 with a chief complaint of shortness of breath.   Assessment:  Patient admitted with PNA, recent COVID+ PNA - admitted to the hospital from 11/19 > 11/24.   BCID + 1/4 GPC in clusters, no BCID panel ran at this time.  Name of physician (or Provider) Contacted: Dr. Antonieta Pert  Current antibiotics: cefepime + vancomycin  Changes to prescribed antibiotics recommended: No changes at this time  Results for orders placed or performed during the hospital encounter of 09/21/17  Blood Culture ID Panel (Reflexed) (Collected: 09/21/2017 11:08 PM)  Result Value Ref Range   Enterococcus species NOT DETECTED NOT DETECTED   Listeria monocytogenes NOT DETECTED NOT DETECTED   Staphylococcus species NOT DETECTED NOT DETECTED   Staphylococcus aureus (BCID) NOT DETECTED NOT DETECTED   Streptococcus species NOT DETECTED NOT DETECTED   Streptococcus agalactiae NOT DETECTED NOT DETECTED   Streptococcus pneumoniae NOT DETECTED NOT DETECTED   Streptococcus pyogenes NOT DETECTED NOT DETECTED   Acinetobacter baumannii NOT DETECTED NOT DETECTED   Enterobacteriaceae species NOT DETECTED NOT DETECTED   Enterobacter cloacae complex NOT DETECTED NOT DETECTED   Escherichia coli NOT DETECTED NOT DETECTED   Klebsiella oxytoca NOT DETECTED NOT DETECTED   Klebsiella pneumoniae NOT DETECTED NOT DETECTED   Proteus species NOT DETECTED NOT DETECTED   Serratia marcescens NOT DETECTED NOT DETECTED   Haemophilus influenzae NOT DETECTED NOT DETECTED   Neisseria meningitidis NOT DETECTED NOT DETECTED   Pseudomonas aeruginosa NOT DETECTED NOT DETECTED   Candida albicans NOT DETECTED NOT DETECTED   Candida glabrata NOT DETECTED NOT DETECTED   Candida krusei NOT DETECTED NOT DETECTED   Candida parapsilosis NOT DETECTED NOT DETECTED   Candida tropicalis  NOT DETECTED NOT DETECTED    Lenis Noon, PharmD 06/01/2019  5:40 PM

## 2019-06-02 ENCOUNTER — Other Ambulatory Visit: Payer: Self-pay

## 2019-06-02 LAB — CBC WITH DIFFERENTIAL/PLATELET
Abs Immature Granulocytes: 0.06 10*3/uL (ref 0.00–0.07)
Basophils Absolute: 0 10*3/uL (ref 0.0–0.1)
Basophils Relative: 0 %
Eosinophils Absolute: 0 10*3/uL (ref 0.0–0.5)
Eosinophils Relative: 0 %
HCT: 29.1 % — ABNORMAL LOW (ref 36.0–46.0)
Hemoglobin: 9.4 g/dL — ABNORMAL LOW (ref 12.0–15.0)
Immature Granulocytes: 1 %
Lymphocytes Relative: 3 %
Lymphs Abs: 0.4 10*3/uL — ABNORMAL LOW (ref 0.7–4.0)
MCH: 30 pg (ref 26.0–34.0)
MCHC: 32.3 g/dL (ref 30.0–36.0)
MCV: 93 fL (ref 80.0–100.0)
Monocytes Absolute: 0.2 10*3/uL (ref 0.1–1.0)
Monocytes Relative: 2 %
Neutro Abs: 9.8 10*3/uL — ABNORMAL HIGH (ref 1.7–7.7)
Neutrophils Relative %: 94 %
Platelets: 86 10*3/uL — ABNORMAL LOW (ref 150–400)
RBC: 3.13 MIL/uL — ABNORMAL LOW (ref 3.87–5.11)
RDW: 13.3 % (ref 11.5–15.5)
WBC: 10.5 10*3/uL (ref 4.0–10.5)
nRBC: 0 % (ref 0.0–0.2)

## 2019-06-02 LAB — COMPREHENSIVE METABOLIC PANEL
ALT: 20 U/L (ref 0–44)
AST: 17 U/L (ref 15–41)
Albumin: 2.5 g/dL — ABNORMAL LOW (ref 3.5–5.0)
Alkaline Phosphatase: 52 U/L (ref 38–126)
Anion gap: 12 (ref 5–15)
BUN: 55 mg/dL — ABNORMAL HIGH (ref 8–23)
CO2: 21 mmol/L — ABNORMAL LOW (ref 22–32)
Calcium: 8.7 mg/dL — ABNORMAL LOW (ref 8.9–10.3)
Chloride: 106 mmol/L (ref 98–111)
Creatinine, Ser: 1.54 mg/dL — ABNORMAL HIGH (ref 0.44–1.00)
GFR calc Af Amer: 39 mL/min — ABNORMAL LOW (ref >60)
GFR calc non Af Amer: 33 mL/min — ABNORMAL LOW (ref >60)
Glucose, Bld: 224 mg/dL — ABNORMAL HIGH (ref 70–99)
Potassium: 3.7 mmol/L (ref 3.5–5.1)
Sodium: 139 mmol/L (ref 135–145)
Total Bilirubin: 0.5 mg/dL (ref 0.3–1.2)
Total Protein: 6 g/dL — ABNORMAL LOW (ref 6.5–8.1)

## 2019-06-02 LAB — GLUCOSE, CAPILLARY
Glucose-Capillary: 134 mg/dL — ABNORMAL HIGH (ref 70–99)
Glucose-Capillary: 149 mg/dL — ABNORMAL HIGH (ref 70–99)
Glucose-Capillary: 185 mg/dL — ABNORMAL HIGH (ref 70–99)
Glucose-Capillary: 200 mg/dL — ABNORMAL HIGH (ref 70–99)
Glucose-Capillary: 266 mg/dL — ABNORMAL HIGH (ref 70–99)

## 2019-06-02 LAB — MAGNESIUM: Magnesium: 2.7 mg/dL — ABNORMAL HIGH (ref 1.7–2.4)

## 2019-06-02 LAB — PHOSPHORUS: Phosphorus: 3.7 mg/dL (ref 2.5–4.6)

## 2019-06-02 LAB — C-REACTIVE PROTEIN: CRP: 7.2 mg/dL — ABNORMAL HIGH (ref <1.0)

## 2019-06-02 LAB — FERRITIN: Ferritin: 353 ng/mL — ABNORMAL HIGH (ref 11–307)

## 2019-06-02 LAB — D-DIMER, QUANTITATIVE: D-Dimer, Quant: 1.5 ug/mL-FEU — ABNORMAL HIGH (ref 0.00–0.50)

## 2019-06-02 MED ORDER — MOMETASONE FURO-FORMOTEROL FUM 200-5 MCG/ACT IN AERO
2.0000 | INHALATION_SPRAY | Freq: Two times a day (BID) | RESPIRATORY_TRACT | Status: DC
  Administered 2019-06-02 – 2019-06-10 (×16): 2 via RESPIRATORY_TRACT
  Filled 2019-06-02 (×2): qty 8.8

## 2019-06-02 MED ORDER — ADULT MULTIVITAMIN W/MINERALS CH
1.0000 | ORAL_TABLET | Freq: Every day | ORAL | Status: DC
  Administered 2019-06-02 – 2019-06-10 (×9): 1 via ORAL
  Filled 2019-06-02 (×9): qty 1

## 2019-06-02 MED ORDER — ATORVASTATIN CALCIUM 40 MG PO TABS
40.0000 mg | ORAL_TABLET | Freq: Every day | ORAL | Status: DC
  Administered 2019-06-02 – 2019-06-09 (×8): 40 mg via ORAL
  Filled 2019-06-02 (×8): qty 1

## 2019-06-02 MED ORDER — SODIUM CHLORIDE 0.9 % IV SOLN
INTRAVENOUS | Status: DC | PRN
  Administered 2019-06-02: 10:00:00 250 mL via INTRAVENOUS

## 2019-06-02 MED ORDER — VANCOMYCIN HCL IN DEXTROSE 1-5 GM/200ML-% IV SOLN
1000.0000 mg | INTRAVENOUS | Status: DC
  Administered 2019-06-02: 1000 mg via INTRAVENOUS
  Filled 2019-06-02: qty 200

## 2019-06-02 MED ORDER — EZETIMIBE 10 MG PO TABS
10.0000 mg | ORAL_TABLET | Freq: Every day | ORAL | Status: DC
  Administered 2019-06-02 – 2019-06-10 (×9): 10 mg via ORAL
  Filled 2019-06-02 (×9): qty 1

## 2019-06-02 MED ORDER — TIOTROPIUM BROMIDE MONOHYDRATE 18 MCG IN CAPS: 18.0000 ug | ORAL_CAPSULE | Freq: Every day | RESPIRATORY_TRACT | Status: DC

## 2019-06-02 MED ORDER — METOPROLOL SUCCINATE ER 25 MG PO TB24
25.0000 mg | ORAL_TABLET | Freq: Every day | ORAL | Status: DC
  Administered 2019-06-02 – 2019-06-10 (×9): 25 mg via ORAL
  Filled 2019-06-02 (×9): qty 1

## 2019-06-02 MED ORDER — INSULIN GLARGINE 100 UNIT/ML ~~LOC~~ SOLN
24.0000 [IU] | Freq: Every day | SUBCUTANEOUS | Status: DC
  Administered 2019-06-02: 24 [IU] via SUBCUTANEOUS
  Filled 2019-06-02: qty 0.24

## 2019-06-02 MED ORDER — HYDROCOD POLST-CPM POLST ER 10-8 MG/5ML PO SUER: 5.0000 mL | Freq: Two times a day (BID) | ORAL | Status: DC | PRN

## 2019-06-02 MED ORDER — FLUTICASONE PROPIONATE 50 MCG/ACT NA SUSP
1.0000 | Freq: Every day | NASAL | Status: DC
  Administered 2019-06-02 – 2019-06-10 (×9): 1 via NASAL
  Filled 2019-06-02: qty 16

## 2019-06-02 MED ORDER — PANTOPRAZOLE SODIUM 40 MG PO TBEC
40.0000 mg | DELAYED_RELEASE_TABLET | Freq: Every day | ORAL | Status: DC
  Administered 2019-06-02 – 2019-06-10 (×9): 40 mg via ORAL
  Filled 2019-06-02 (×9): qty 1

## 2019-06-02 MED ORDER — ADULT MULTIVITAMIN W/MINERALS CH
1.0000 | ORAL_TABLET | Freq: Every day | ORAL | Status: DC
  Filled 2019-06-02: qty 1

## 2019-06-02 MED ORDER — ASPIRIN EC 81 MG PO TBEC
81.0000 mg | DELAYED_RELEASE_TABLET | Freq: Every day | ORAL | Status: DC
  Administered 2019-06-02 – 2019-06-05 (×4): 81 mg via ORAL
  Filled 2019-06-02 (×4): qty 1

## 2019-06-02 NOTE — Progress Notes (Signed)
Pharmacy Antibiotic Note  Andrea [name] is a 73 y.o. female admitted on 05/31/2019 with pneumonia.  Pharmacy has been consulted for vancomycin + cefepime dosing.  Pt presented with SOB. PMH significant for recent COVID PNA + 11/19 and was hospitalized. Pt discharged from hospital on 11/24 after receiving course of remdesivir. Pt also has COPD on home oxygen. Pt initiated on azithromycin + CTX, now being transitioned to broad spectrum antibiotics initiated for PNA.  Today, 06/02/19  WBC WNL.   IV steroids stopped  SCr Improved to 1.54, CrCl ~ 30 mL/min. History of CKD.SCr appears to be ~baseline  PCT <0.10  CRP trending down   Afebrile  Plan:  Cefepime 2 g IV q24h  Adjust maintenance dosing of Vancomycin 1500 to 1000 mg IV q36h with improving renal function   Goal vancomycin AUC 400-550. Check vancomycin levels once at steady state as indicated  Follow renal function, culture data  Recommend checking MRSA PCR  Height: 5\' 2"  (157.5 cm) Weight: 149 lb 12.8 oz (67.9 kg) IBW/kg (Calculated) : 50.1  Temp (24hrs), Avg:97.5 F (36.4 C), Min:96.2 F (35.7 C), Max:98.9 F (37.2 C)  Recent Labs  Lab 05/31/19 1024 05/31/19 1141 05/31/19 1341 06/01/19 0447 06/01/19 0830 06/01/19 1101 06/01/19 1647 06/02/19 0242  WBC 11.8*  --   --  6.2  --   --   --  10.5  CREATININE 1.93*  --   --   --  2.05* 2.07* 1.69* 1.54*  LATICACIDVEN  --  1.2 1.0  --   --   --   --   --     Estimated Creatinine Clearance: 29.8 mL/min (A) (by C-G formula based on SCr of 1.54 mg/dL (H)).    Allergies  Allergen Reactions  . Metformin And Related Diarrhea  . Sulfur Hives   Antimicrobials this admission: azithromycin 12/5 once dose ceftriaxone 12/5 one dose Cefepime 12/6 >> Vancomycin 12/6 >>  Dose adjustments this admission:  Microbiology results: 12/5 BCx: Gr+ cocci  12/6 MRSA PCR: negative  Respiratory panel: Sent  Thank you for allowing pharmacy to be a part of this patient's  care.   Royetta Asal, PharmD, BCPS 06/02/2019 10:23 AM

## 2019-06-02 NOTE — Progress Notes (Signed)
PHARMACY - PHYSICIAN COMMUNICATION CRITICAL VALUE ALERT - BLOOD CULTURE IDENTIFICATION (BCID)  Andrea [name] is an 73 y.o. female who presented to [facility] on 05/31/2019 with a chief complaint of shortness of breath.   Assessment:  Patient admitted with PNA, recent COVID+ PNA - admitted to the hospital from 11/19 > 11/24.   BCID + 1 anaerobic bottle with GPC and 1 aerobic bottle with GPR so 1 set of 2 with growth - no BCID panel ran at this time.  Name of physician (or Provider) Contacted: n/a  Current antibiotics: cefepime + vancomycin  Changes to prescribed antibiotics recommended: No changes at this time  Results for orders placed or performed during the hospital encounter of 09/21/17  Blood Culture ID Panel (Reflexed) (Collected: 09/21/2017 11:08 PM)  Result Value Ref Range   Enterococcus species NOT DETECTED NOT DETECTED   Listeria monocytogenes NOT DETECTED NOT DETECTED   Staphylococcus species NOT DETECTED NOT DETECTED   Staphylococcus aureus (BCID) NOT DETECTED NOT DETECTED   Streptococcus species NOT DETECTED NOT DETECTED   Streptococcus agalactiae NOT DETECTED NOT DETECTED   Streptococcus pneumoniae NOT DETECTED NOT DETECTED   Streptococcus pyogenes NOT DETECTED NOT DETECTED   Acinetobacter baumannii NOT DETECTED NOT DETECTED   Enterobacteriaceae species NOT DETECTED NOT DETECTED   Enterobacter cloacae complex NOT DETECTED NOT DETECTED   Escherichia coli NOT DETECTED NOT DETECTED   Klebsiella oxytoca NOT DETECTED NOT DETECTED   Klebsiella pneumoniae NOT DETECTED NOT DETECTED   Proteus species NOT DETECTED NOT DETECTED   Serratia marcescens NOT DETECTED NOT DETECTED   Haemophilus influenzae NOT DETECTED NOT DETECTED   Neisseria meningitidis NOT DETECTED NOT DETECTED   Pseudomonas aeruginosa NOT DETECTED NOT DETECTED   Candida albicans NOT DETECTED NOT DETECTED   Candida glabrata NOT DETECTED NOT DETECTED   Candida krusei NOT DETECTED NOT DETECTED   Candida  parapsilosis NOT DETECTED NOT DETECTED   Candida tropicalis NOT DETECTED NOT DETECTED    Kara Mead, PharmD 06/02/2019  8:07 PM

## 2019-06-02 NOTE — TOC Initial Note (Signed)
Transition of Care [facility]) - Initial/Assessment Note    Patient Details  Name: Andrea [name] MRN: [id number] Date of Birth: 10-Nov-1945  Transition of Care [facility]) CM/SW Contact:    Nila Nephew, LCSW Phone Number: 06/02/2019, 12:36 PM  Clinical Narrative:         Completed high readmission risk screening due to score 28%. Pt admitted from home where she resides alone, with HCAP, COVID+, for which she was admitted recently as well 04/2019.         TOC team can be consulted for disposition needs.      Activities of Daily Living Home Assistive Devices/Equipment: Oxygen ADL Screening (condition at time of admission) Patient's cognitive ability adequate to safely complete daily activities?: Yes Is the patient deaf or have difficulty hearing?: Yes Does the patient have difficulty seeing, even when wearing glasses/contacts?: No Does the patient have difficulty concentrating, remembering, or making decisions?: No Patient able to express need for assistance with ADLs?: Yes Does the patient have difficulty dressing or bathing?: No Independently performs ADLs?: Yes (appropriate for developmental age) Does the patient have difficulty walking or climbing stairs?: Yes(Pt wears O2 baseline) Weakness of Legs: None Weakness of Arms/Hands: None   Admission diagnosis:  Hypoxia [R09.02] Community acquired pneumonia, unspecified laterality [J18.9] COVID-19 [U07.1] Patient Active Problem List   Diagnosis Date Noted  . HCAP (healthcare-associated pneumonia) 05/31/2019  . Pancytopenia ([place]) 05/17/2019  . Sepsis ([place]) 05/16/2019  . Fever 05/16/2019  . Pleural effusion with transudate 09/09/2018  . Pulmonary embolism on right ([place]) 09/09/2018  . DOE (dyspnea on exertion) 08/08/2018  . 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 Acute bronchitis with chronic obstructive pulmonary disease (COPD) ([place]) 11/15/2017  . Pneumonia 09/22/2017  . H/O solitary pulmonary nodule 04/14/2014  . Chronic right hip pain  09/01/2013  . Peptic ulcer symptoms 04/04/2013  . GERD (gastroesophageal reflux disease) 10/22/2012  . Atherosclerotic heart disease of native coronary artery without angina pectoris 10/11/2012  . Malignant neoplasm of female breast ([place]) 02/16/2011  . H/O TIA (transient ischemic attack) and stroke 11/01/2009  . Chronic kidney disease 02/23/2009  . Hypertension, benign 11/26/2008   PCP:  Chester Holstein, MD Pharmacy:   [place], [place] - [place] [address] [place] [address] Phone: [phone] Fax: [phone]        Readmission Risk Interventions Readmission Risk Prevention Plan 06/02/2019  Transportation Screening Complete  PCP or Specialist Appt within 3-5 Days Not Complete  Not Complete comments pt not ready to [place] or [place] Not Complete  HRI or Home Care Consult comments pending need  Social Work Consult for [place] Planning/Counseling Not Complete  SW consult not completed comments pending need  Palliative Care Screening Not Complete  Palliative Care Screening Not Complete Comments pending need  Medication Review (RN Care Manager) Referral to Pharmacy  Some recent data might be hidden

## 2019-06-02 NOTE — Progress Notes (Signed)
RN noticed patient switched from NSR to controlled A-fib. Pt reports a history of afib. RN reviewed at home meds, and found pt has been taking metoprolol and ASA at home. EKG showed controlled afib. Dr. Maren Beach notified by RN, and spoke with pt over the phone. Orders placed to start metoprolol and ASA and continue to monitor. Pt denies increased SOB or chest pain. RN will continue to monitor pt.

## 2019-06-02 NOTE — Progress Notes (Addendum)
PROGRESS NOTE    Andrea [name]  [id number] DOB: March 30, 1946 DOA: 05/31/2019 PCP: Chester Holstein, MD   Brief Narrative: 73 year old female with history of COPD, diabetes CKD,Stage III history of pulmonary embolism, CAD, chronic hypoxic respiratory failure, hypertension with recent diagnosis of Covid 11/19 discharged from hospital 11/24 after treatment with Decadron and remdesivir comes to the ER with subjective fever, shortness of breath productive cough. In the ER, vitals are stable with oxygen saturation 90% on 3 L nasal cannula labs showed CRP 14.8, lactic acid 1.0, pro-Cal less than 0.1, D-dimer 2.5. CXR 'Mild patchy opacities in the lingula and bilateral lower lobes, suspicious for pneumonia" Patient was admitted for acute on chronic hypoxia/HCAP/COPD exacerbation  Subjective:  Patient reports she is feeling better today. Still having lots of coughing.Off insulin gtt On 2l [place] sat at 96%,T max 98.9, t min 96.2   Assessment & Plan:   HCAP with recent COVID-19 pneumonia:X-ray with mild patchy opacities in the lingula and bilateral lower lobes. Patient had completed remdesivir/Decadron ON 11/24,?Post Covid fibrosis-unable to obtain CTA chest due to CKD. Remains afebrile,CRP/D dimer/Ferritin improving as below. Blood culture +  gpc.Cont on vancomycin and cefepime given concern for HCAP. Nasal  MRSA PCR neg.Ordered Respiratory panel.  COVID-19 Labs and trends: Recent Labs    05/31/19 1308 05/31/19 1424 06/01/19 0447 06/02/19 0242  DDIMER 2.51*  --  1.89* 1.50*  FERRITIN  --  339* 351* 353*  LDH  --  216*  --   --   CRP  --  15.8* 15.0* 7.2*   Bactermia, GPC in clusters:Cont abx-vanco as above await culture.  Metabolic acidosis with hyperglycemia, pneumonia-monitor. Hco3 at 21.  Type 2 diabetes mellitus, A1c uncontrolled 8.9 on 11/20 with uncontrolled hyperglycemia up to 700s- Needing insulin gtt- now off. Sugar at 174-224. Cont levemir 24 un bedtime, ssi q4 hr. Reports has  not had any meds after friday , also  received steroid here- now off.   Cont SSI ,monitor. Home OHA on hold.  Acute COPD exacerbation: Currently not wheezing, will continue inhalers and hold off steroid, resume home Spiriva.dulers.  Acute on chronic hypoxic respiratory failure due to #1 normally on 2 L nasal cannula at home but has been hypoxic on that, was on 3 L now at 2. Continue treatment as above.  CKD stage IIIb, Creat up to 1.20. Baseline creatinine 1.6-2.0. creat at 1.5,likely had some AKI.  cont oral hydration.    HTN/HLD/CAD -CABG 07/2018: Resume home Aspirin, metoprolol.continue to hold Norvasc, Lasix. Patient reports she is on 2 L nasal cannula since her cardiac surgery in February 2020.   History of PE in March 2020 not on anticoagulation. She finished eliquis after 6 month, and was stopped her meds  Thrombocytopenia:plt down at at [place] if it is due to Covid infection.  Monitor while on heparin.  Normocytic anemia in the setting of CKD monitor. Hb 9-10 gm.  PAF, reports she has known A fib, seen by her cardio,not on anticoagulation, reports that sheis f/b by Dr Sabra Heck at [facility]. Being watched by per cardio, and she was discussed about risk of stroke and her Dr is  monitoring her-Defer to her cardio re AC.Resume metoprolol, asa.  Body mass index is 27.4 kg/m.   DVT prophylaxis:Heparin Code Status:Full Family Communication: plan of care discussed with patient at bedside. I called her sister to update-no answer then called her granddaughter 12/6-updated her all questions answered.  Disposition Plan: Remains inpatient pending clinical improvement in  her hypoxia.can transfer out of SDU  Consultants: none Procedures:none Microbiology: Blood pending Antimicrobials: Anti-infectives (From admission, onward)   Start     Dose/Rate Route Frequency Ordered Stop   06/03/19 1000  vancomycin (VANCOCIN) IVPB 1000 mg/200 mL premix     1,000 mg 200 mL/hr over 60 Minutes  Intravenous Every 48 hours 06/01/19 0949     06/01/19 1100  ceFEPIme (MAXIPIME) 2 g in sodium chloride 0.9 % 100 mL IVPB     2 g 200 mL/hr over 30 Minutes Intravenous Daily 06/01/19 0944     06/01/19 1100  vancomycin (VANCOCIN) 1,500 mg in sodium chloride 0.9 % 500 mL IVPB     1,500 mg 250 mL/hr over 120 Minutes Intravenous  Once 06/01/19 0948 06/01/19 2130   05/31/19 1145  cefTRIAXone (ROCEPHIN) 1 g in sodium chloride 0.9 % 100 mL IVPB     1 g 200 mL/hr over 30 Minutes Intravenous  Once 05/31/19 1143 05/31/19 1340   05/31/19 1145  azithromycin (ZITHROMAX) 500 mg in sodium chloride 0.9 % 250 mL IVPB     500 mg 250 mL/hr over 60 Minutes Intravenous  Once 05/31/19 1143 05/31/19 1512       Objective: Vitals:   06/01/19 1900 06/01/19 2000 06/01/19 2330 06/02/19 0400  BP: (!) 158/46 (!) 133/44 (!) 130/51 (!) 132/50  Pulse: 66 72 69 62  Resp: 16 15 (!) 21 16  Temp:  (!) 97.1 F (36.2 C) (!) 96.2 F (35.7 C) (!) 96.4 F (35.8 C)  TempSrc:  Oral Oral Oral  SpO2: 96% 96% 99% 96%  Weight:      Height:        Intake/Output Summary (Last 24 hours) at 06/02/2019 0729 Last data filed at 06/01/2019 2100 Gross per 24 hour  Intake 655.62 ml  Output 200 ml  Net 455.62 ml   Filed Weights   05/31/19 0847 06/01/19 0102  Weight: 68 kg 67.9 kg   Weight change:   Body mass index is 27.4 kg/m.  Intake/Output from previous day: 12/06 0701 - 12/07 0700 In: 655.6 [I.V.:565.6; IV Piggyback:90] Out: 200 [Urine:200] Intake/Output this shift: No intake/output data recorded.  Examination:  General exam: Alert awake oriented x3, thin frail weak appearing, not in distress.   HEENT:Oral mucosa moist, Ear/Nose WNL grossly, dentition normal. Respiratory system: Bilaterally clear breath sounds no wheezing or crackles, ,no use of accessory muscle Cardiovascular system: S1 & S2 +, No JVD,. Gastrointestinal system: Abdomen soft, nt, nd Nervous System:Alert, awake, moving extremities and grossly  nonfocal Extremities: No edema, distal peripheral pulses palpable.  Skin: No rashes,no icterus. MSK: Normal muscle bulk,tone, power  Medications:  Scheduled Meds:  Chlorhexidine Gluconate Cloth  6 each Topical Daily   heparin injection (subcutaneous)  5,000 Units Subcutaneous Q8H   insulin aspart  0-20 Units Subcutaneous Q4H   insulin glargine  24 Units Subcutaneous QHS   Ipratropium-Albuterol  1 puff Inhalation Q6H   linagliptin  5 mg Oral Daily   mouth rinse  15 mL Mouth Rinse BID   vitamin C  500 mg Oral Daily   zinc sulfate  220 mg Oral Daily   Continuous Infusions:  ceFEPime (MAXIPIME) IV Stopped (06/01/19 1339)   [START ON 06/03/2019] vancomycin      Data Reviewed: I have personally reviewed following labs and imaging studies  CBC: Recent Labs  Lab 05/31/19 1024 06/01/19 0447 06/02/19 0242  WBC 11.8* 6.2 10.5  NEUTROABS 10.4* 5.6 9.8*  HGB 10.9* 10.1* 9.4*  HCT 34.3*  31.9* 29.1*  MCV 92.2 93.3 93.0  PLT 100* 98* 86*   Basic Metabolic Panel: Recent Labs  Lab 05/31/19 1024 06/01/19 0447 06/01/19 0830 06/01/19 1101 06/01/19 1647 06/02/19 0242  NA 137  --  131* 131* 138 139  K 3.4*  --  4.5 4.1 3.6 3.7  CL 96*  --  96* 96* 106 106  CO2 28  --  20* 20* 21* 21*  GLUCOSE 199*  --  573* 714* 245* 224*  BUN 48*  --  58* 64* 62* 55*  CREATININE 1.93*  --  2.05* 2.07* 1.69* 1.54*  CALCIUM 8.6*  --  8.6* 8.4* 8.5* 8.7*  MG  --  2.8*  --   --   --  2.7*  PHOS  --  4.1  --   --   --  3.7   GFR: Estimated Creatinine Clearance: 29.8 mL/min (A) (by C-G formula based on SCr of 1.54 mg/dL (H)). Liver Function Tests: Recent Labs  Lab 05/31/19 1024 06/01/19 0830 06/02/19 0242  AST 29 25 17   ALT 25 24 20   ALKPHOS 62 58 52  BILITOT 0.9 1.1 0.5  PROT 7.1 6.7 6.0*  ALBUMIN 3.1* 2.9* 2.5*   No results for input(s): LIPASE, AMYLASE in the last 168 hours. No results for input(s): AMMONIA in the last 168 hours. Coagulation Profile: No results for  input(s): INR, PROTIME in the last 168 hours. Cardiac Enzymes: No results for input(s): CKTOTAL, CKMB, CKMBINDEX, TROPONINI in the last 168 hours. BNP (last 3 results) No results for input(s): PROBNP in the last 8760 hours. HbA1C: No results for input(s): HGBA1C in the last 72 hours. CBG: Recent Labs  Lab 06/01/19 1843 06/01/19 1948 06/01/19 2156 06/01/19 2326 06/02/19 0054  GLUCAP 159* 164* 174* 179* 185*   Lipid Profile: No results for input(s): CHOL, HDL, LDLCALC, TRIG, CHOLHDL, LDLDIRECT in the last 72 hours. Thyroid Function Tests: No results for input(s): TSH, T4TOTAL, FREET4, T3FREE, THYROIDAB in the last 72 hours. Anemia Panel: Recent Labs    06/01/19 0447 06/02/19 0242  FERRITIN 351* 353*   Sepsis Labs: Recent Labs  Lab 05/31/19 1141 05/31/19 1341 05/31/19 1424  PROCALCITON  --   --  <0.10  LATICACIDVEN 1.2 1.0  --     Recent Results (from the past 240 hour(s))  Blood culture (routine x 2)     Status: None (Preliminary result)   Collection Time: 05/31/19 11:41 AM   Specimen: Right Antecubital; Blood  Result Value Ref Range Status   Specimen Description   Final    RIGHT ANTECUBITAL Performed at [facility] [address], [place], [place] [address]    Special Requests   Final    BOTTLES DRAWN AEROBIC AND ANAEROBIC Blood Culture adequate volume Performed at [facility] [address], [place], [place] [address]    Culture  Setup Time   Final    GRAM POSITIVE COCCI IN CLUSTERS ANAEROBIC BOTTLE ONLY CRITICAL RESULT CALLED TO, READ BACK BY AND VERIFIED WITH: Shelda Jakes PHARMD, AT [id number] 06/01/19 BY Rush Landmark Performed at [facility], [place] [address] [place], [place] [address]    Culture GRAM POSITIVE COCCI  Final   Report Status PENDING  Incomplete  Blood culture (routine x 2)     Status: None (Preliminary result)   Collection Time: 05/31/19 11:46 AM   Specimen: Left Antecubital; Blood  Result Value Ref  Range Status   Specimen Description   Final  LEFT ANTECUBITAL Performed at [facility] [address] [place], [place] [address]    Special Requests   Final    BOTTLES DRAWN AEROBIC AND ANAEROBIC Blood Culture adequate volume Performed at [facility] [address], [place], [place] [address]    Culture   Final    NO GROWTH < 24 HOURS Performed at [facility] [address] [place], [place] [address]    Report Status PENDING  Incomplete  MRSA PCR Screening     Status: None   Collection Time: 06/01/19 10:26 AM   Specimen: Nasal Mucosa; Nasopharyngeal  Result Value Ref Range Status   MRSA by PCR NEGATIVE NEGATIVE Final    Comment:        The GeneXpert MRSA Assay (FDA approved for NASAL specimens only), is one component of a comprehensive MRSA colonization surveillance program. It is not intended to diagnose MRSA infection nor to guide or monitor treatment for MRSA infections. Performed at [facility], [place] [address] [place], [place] [address]       Radiology Studies: Dg Chest Portable 1 View  Result Date: 05/31/2019 CLINICAL DATA:  Shortness of breath, cough, headache. Diagnosed with COVID on 11/20. EXAM: PORTABLE CHEST 1 VIEW COMPARISON:  05/20/2019 FINDINGS: Mild patchy opacity in the lingula and bilateral lower lobes. No pleural effusion or pneumothorax. The heart is normal in size. Postsurgical changes related to prior CABG. Left vascular stent. Median sternotomy. Surgical clips overlying the right breast/lateral chest wall. IMPRESSION: Mild patchy opacities in the lingula and bilateral lower lobes, suspicious for pneumonia. Electronically Signed   By: Julian Hy M.D.   On: 05/31/2019 11:24      LOS: 2 days   Time spent: More than 50% of that time was spent in counseling and/or coordination of care.  Antonieta Pert, MD [facility]  06/02/2019, 7:29 AM

## 2019-06-02 NOTE — Progress Notes (Signed)
Inpatient Diabetes Program Recommendations  AACE/ADA: New Consensus Statement on Inpatient Glycemic Control (2015)  Target Ranges:  Prepandial:   less than 140 mg/dL      Peak postprandial:   less than 180 mg/dL (1-2 hours)      Critically ill patients:  140 - 180 mg/dL   Lab Results  Component Value Date   GLUCAP 266 (H) 06/02/2019   HGBA1C 8.9 (H) 05/16/2019    Review of Glycemic Control  Diabetes history: DM2 Outpatient Diabetes medications: glipizide 10 mg bid, Januvia 50 mg QD Current orders for Inpatient glycemic control: Lantus 24 units QHS, Novolog 0-20 units Q4H, tradjenta 5 mg QD  HgbA1C - 8.9% Has just started Lantus this am, since off drip.  Inpatient Diabetes Program Recommendations:     Will likely need meal coverage insulin when po intake increases. May need small amount of insulin when discharged.  Will follow closely.  Thank you. Lorenda Peck, RD, LDN, CDE Inpatient Diabetes Coordinator [phone]

## 2019-06-03 LAB — D-DIMER, QUANTITATIVE: D-Dimer, Quant: 2.03 ug/mL-FEU — ABNORMAL HIGH (ref 0.00–0.50)

## 2019-06-03 LAB — GLUCOSE, CAPILLARY
Glucose-Capillary: 181 mg/dL — ABNORMAL HIGH (ref 70–99)
Glucose-Capillary: 153 mg/dL — ABNORMAL HIGH (ref 70–99)
Glucose-Capillary: 119 mg/dL — ABNORMAL HIGH (ref 70–99)
Glucose-Capillary: 80 mg/dL (ref 70–99)
Glucose-Capillary: 149 mg/dL — ABNORMAL HIGH (ref 70–99)
Glucose-Capillary: 185 mg/dL — ABNORMAL HIGH (ref 70–99)
Glucose-Capillary: 111 mg/dL — ABNORMAL HIGH (ref 70–99)

## 2019-06-03 LAB — CBC WITH DIFFERENTIAL/PLATELET
Abs Immature Granulocytes: 0.04 10*3/uL (ref 0.00–0.07)
Basophils Absolute: 0 10*3/uL (ref 0.0–0.1)
Basophils Relative: 0 %
Eosinophils Absolute: 0 10*3/uL (ref 0.0–0.5)
Eosinophils Relative: 0 %
HCT: 27.5 % — ABNORMAL LOW (ref 36.0–46.0)
Hemoglobin: 8.8 g/dL — ABNORMAL LOW (ref 12.0–15.0)
Immature Granulocytes: 1 %
Lymphocytes Relative: 11 %
Lymphs Abs: 0.9 10*3/uL (ref 0.7–4.0)
MCH: 30.1 pg (ref 26.0–34.0)
MCHC: 32 g/dL (ref 30.0–36.0)
MCV: 94.2 fL (ref 80.0–100.0)
Monocytes Absolute: 0.3 10*3/uL (ref 0.1–1.0)
Monocytes Relative: 3 %
Neutro Abs: 7 10*3/uL (ref 1.7–7.7)
Neutrophils Relative %: 85 %
Platelets: 79 10*3/uL — ABNORMAL LOW (ref 150–400)
RBC: 2.92 MIL/uL — ABNORMAL LOW (ref 3.87–5.11)
RDW: 13.7 % (ref 11.5–15.5)
WBC: 8.2 10*3/uL (ref 4.0–10.5)
nRBC: 0 % (ref 0.0–0.2)

## 2019-06-03 LAB — COMPREHENSIVE METABOLIC PANEL
ALT: 29 U/L (ref 0–44)
AST: 27 U/L (ref 15–41)
Albumin: 2.5 g/dL — ABNORMAL LOW (ref 3.5–5.0)
Alkaline Phosphatase: 55 U/L (ref 38–126)
Anion gap: 10 (ref 5–15)
BUN: 52 mg/dL — ABNORMAL HIGH (ref 8–23)
CO2: 22 mmol/L (ref 22–32)
Calcium: 8.3 mg/dL — ABNORMAL LOW (ref 8.9–10.3)
Chloride: 105 mmol/L (ref 98–111)
Creatinine, Ser: 1.49 mg/dL — ABNORMAL HIGH (ref 0.44–1.00)
GFR calc Af Amer: 40 mL/min — ABNORMAL LOW (ref >60)
GFR calc non Af Amer: 35 mL/min — ABNORMAL LOW (ref >60)
Glucose, Bld: 173 mg/dL — ABNORMAL HIGH (ref 70–99)
Potassium: 3.9 mmol/L (ref 3.5–5.1)
Sodium: 137 mmol/L (ref 135–145)
Total Bilirubin: 0.8 mg/dL (ref 0.3–1.2)
Total Protein: 5.9 g/dL — ABNORMAL LOW (ref 6.5–8.1)

## 2019-06-03 LAB — C-REACTIVE PROTEIN: CRP: 5.6 mg/dL — ABNORMAL HIGH (ref <1.0)

## 2019-06-03 LAB — FERRITIN: Ferritin: 203 ng/mL (ref 11–307)

## 2019-06-03 LAB — MAGNESIUM: Magnesium: 2.5 mg/dL — ABNORMAL HIGH (ref 1.7–2.4)

## 2019-06-03 LAB — PHOSPHORUS: Phosphorus: 2.2 mg/dL — ABNORMAL LOW (ref 2.5–4.6)

## 2019-06-03 MED ORDER — INSULIN ASPART 100 UNIT/ML ~~LOC~~ SOLN: 0.0000 [IU] | Freq: Every day | SUBCUTANEOUS | Status: DC

## 2019-06-03 MED ORDER — INSULIN ASPART 100 UNIT/ML ~~LOC~~ SOLN
0.0000 [IU] | Freq: Three times a day (TID) | SUBCUTANEOUS | Status: DC
  Administered 2019-06-03: 11:00:00 2 [IU] via SUBCUTANEOUS
  Administered 2019-06-03: 3 [IU] via SUBCUTANEOUS
  Administered 2019-06-04: 11:00:00 8 [IU] via SUBCUTANEOUS

## 2019-06-03 MED ORDER — K PHOS MONO-SOD PHOS DI & MONO 155-852-130 MG PO TABS
250.0000 mg | ORAL_TABLET | Freq: Three times a day (TID) | ORAL | Status: AC
  Administered 2019-06-03 – 2019-06-04 (×6): 250 mg via ORAL
  Filled 2019-06-03 (×6): qty 1

## 2019-06-03 MED ORDER — INSULIN GLARGINE 100 UNIT/ML ~~LOC~~ SOLN: 20.0000 [IU] | Freq: Every day | SUBCUTANEOUS | Status: DC

## 2019-06-03 MED ORDER — GLIPIZIDE 10 MG PO TABS
10.0000 mg | ORAL_TABLET | Freq: Two times a day (BID) | ORAL | Status: DC
  Administered 2019-06-03 – 2019-06-04 (×3): 10 mg via ORAL
  Filled 2019-06-03 (×4): qty 1

## 2019-06-03 NOTE — Progress Notes (Signed)
PROGRESS NOTE    Andrea [name]  [id number] DOB: Nov 19, 1945 DOA: 05/31/2019 PCP: Chester Holstein, MD   Brief Narrative: 73 year old female with history of COPD, diabetes CKD,Stage III history of pulmonary embolism, CAD, chronic hypoxic respiratory failure, hypertension with recent diagnosis of Covid 11/19 discharged from hospital 11/24 after treatment with Decadron and remdesivir comes to the ER with subjective fever, shortness of breath productive cough. In the ER, vitals are stable with oxygen saturation 90% on 3 L nasal cannula labs showed CRP 14.8, lactic acid 1.0, pro-Cal less than 0.1, D-dimer 2.5. CXR 'Mild patchy opacities in the lingula and bilateral lower lobes, suspicious for pneumonia" Patient was admitted for acute on chronic hypoxia/HCAP/COPD exacerbation. On a steroid patient has uncontrolled hyperglycemia up to 700 steroid,stop ped steroid as she was not wheezing anymore placed on insulin gtt and transferred to stepdown. Now off insulin drip 12/6 over night.  Subjective: Seen this morning reports feeling better overall.  Cough present.  Saturation  91% on 2 L.Tachypneic this morning. In a fib rate controlled  Assessment & Plan:   HCAP with recent COVID-19 pneumonia:X-ray with mild patchy opacities in the lingula and bilateral lower lobes. Patient had completed remdesivir/Decadron ON 11/24,?Post Covid fibrosis-unable to obtain CTA chest due to CKD.  Clinically improving on current treatment with broad-spectrum antibiotics-vancomycin and cefepime-renal function improving. Inflammatory markers improving as below D-dimer up at 2.0. Blood culture +  gpc.Cont on .Nasal  MRSA PCR neg. Blood culture with coagulase-negative staph.  COVID-19 Labs and trends: Recent Labs    05/31/19 1424 06/01/19 0447 06/02/19 0242 06/03/19 0232  DDIMER  --  1.89* 1.50* 2.03*  FERRITIN 339* 351* 353* 203  LDH 216*  --   --   --   CRP 15.8* 15.0* 7.2* 5.6*   Coagulase negative  Staphylococcus positive in 1 blood culture likely contamination.    Metabolic acidosis with hyperglycemia, pneumonia-monitor. Hco3 at 22.  Type 2 diabetes mellitus, A1c uncontrolled 8.9 on 11/20, not on long-term insulin with uncontrolled hyperglycemia up to 700s- Needing insulin gtt- now off. Sugar at 80-266. Will stop levemir and switch to home glipizide, started on linagliptin in the setting of Covid and can discharge on these and stop Januvia SSI changed to qachs ssi. Appears very sensitive to steroid.  Acute COPD exacerbation: Overall much improved on home inhaler ICS/LABA/LAMA.Hold off iv steroid, cont inhalers.  Acute on chronic hypoxic respiratory failure due to #1 normally on 2 L nasal cannula at home but has been hypoxic on that, was on 3 L now at 2.Continue treatment as above.  CKD stage IIIb, Creat up to 2s.Baseline creatinine 1.6-2.0. creat at 1.49 likely had complained of AKI and admission.Off fluids continue oral hydration   Recent Labs  Lab 06/01/19 0830 06/01/19 1101 06/01/19 1647 06/02/19 0242 06/03/19 0232  BUN 58* 64* 62* 55* 52*  CREATININE 2.05* 2.07* 1.69* 1.54* 1.49*   HTN/HLD/CAD -CABG 07/2018: Resume home Aspirin, metoprolol.continue to hold Norvasc, Lasix. Patient reports she is on 2 L nasal cannula since her cardiac surgery in February 2020.   History of PE in March 2020 not on anticoagulation.She finished eliquis after 6 month, and was stopped her meds.  Hypophosphatemia added oral supplementation.  Thrombocytopenia:plt down at at [place] if it is due to Covid infection.  Platelet has been downtrending since November 22, monitor while on heparin. Recent Labs  Lab 05/31/19 1024 06/01/19 0447 06/02/19 0242 06/03/19 0232  PLT 100* 98* 86* 79*   Normocytic anemia in the  setting of CKD monitor.Hb 9-10 gm.  PAF,rate controlled. She reports she has known A fib, seen by her cardio,not on anticoagulation, reports that she is f/b by Dr Sabra Heck at [facility]. Being watched by per cardio, and she was discussed about risk of stroke and her Dr is  monitoring her-Defer to her cardio re AC. Cont on home metoprolol, asa.  Body mass index is 27.4 kg/m.   DVT prophylaxis:Heparin. Code Status:Full. Family Communication:Plan of care discussed with patient at bedside.Granddaughter updated previously.  Disposition Plan:Remains inpatient pending clinical improvement, anticipate DC in next 1 to 2 days, okay for MedSurg unit.   Consultants: none Procedures:none Microbiology: Blood pending Antimicrobials: Anti-infectives (From admission, onward)   Start     Dose/Rate Route Frequency Ordered Stop   06/03/19 1000  vancomycin (VANCOCIN) IVPB 1000 mg/200 mL premix  Status:  Discontinued     1,000 mg 200 mL/hr over 60 Minutes Intravenous Every 48 hours 06/01/19 0949 06/02/19 1020   06/03/19 0000  vancomycin (VANCOCIN) IVPB 1000 mg/200 mL premix     1,000 mg 200 mL/hr over 60 Minutes Intravenous Every 36 hours 06/02/19 1020     06/01/19 1100  ceFEPIme (MAXIPIME) 2 g in sodium chloride 0.9 % 100 mL IVPB     2 g 200 mL/hr over 30 Minutes Intravenous Daily 06/01/19 0944     06/01/19 1100  vancomycin (VANCOCIN) 1,500 mg in sodium chloride 0.9 % 500 mL IVPB     1,500 mg 250 mL/hr over 120 Minutes Intravenous  Once 06/01/19 0948 06/01/19 2130   05/31/19 1145  cefTRIAXone (ROCEPHIN) 1 g in sodium chloride 0.9 % 100 mL IVPB     1 g 200 mL/hr over 30 Minutes Intravenous  Once 05/31/19 1143 05/31/19 1340   05/31/19 1145  azithromycin (ZITHROMAX) 500 mg in sodium chloride 0.9 % 250 mL IVPB     500 mg 250 mL/hr over 60 Minutes Intravenous  Once 05/31/19 1143 05/31/19 1512       Objective: Vitals:   06/03/19 0000 06/03/19 0400 06/03/19 0755 06/03/19 0800  BP: (!) 147/70 (!) 154/48 (!) 158/64 (!) 161/51  Pulse: 85 68  92  Resp: (!) 25 (!) 23  (!) 26  Temp:  98.8 F (37.1 C) 98.5 F (36.9 C)   TempSrc:  Oral Oral   SpO2: 92% 94%  91%  Weight:       Height:        Intake/Output Summary (Last 24 hours) at 06/03/2019 1002 Last data filed at 06/03/2019 0800 Gross per 24 hour  Intake 872.18 ml  Output 725 ml  Net 147.18 ml   Filed Weights   05/31/19 0847 06/01/19 0102  Weight: 68 kg 67.9 kg   Weight change:   Body mass index is 27.4 kg/m.  Intake/Output from previous day: 12/07 0701 - 12/08 0700 In: 657.4 [P.O.:340; I.V.:17.4; IV Piggyback:300] Out: 725 [Urine:725] Intake/Output this shift: Total I/O In: 220 [P.O.:220] Out: -   Examination:  General exam: AAO x3 frail, not in acute distress, on nasal cannula HEENT:Oral mucosa moist, Ear/Nose WNL grossly, dentition normal. Respiratory system: Clear lung sounds bilaterally diminished at the base no crackles or wheezing, no use of accessory muscles.   Cardiovascular system: S1 & S2 +, No JVD,. Gastrointestinal system: Abdomen soft, nt, nd Nervous System:Alert, awake, and non focal Extremities: No edema, distal peripheral pulses palpable.  Skin: No rashes,no icterus. MSK: Normal muscle bulk,tone, power  Medications:  Scheduled Meds: . aspirin EC  81  mg Oral Daily  . atorvastatin  40 mg Oral QHS  . Chlorhexidine Gluconate Cloth  6 each Topical Daily  . ezetimibe  10 mg Oral Daily  . fluticasone  1 spray Each Nare Daily  . heparin injection (subcutaneous)  5,000 Units Subcutaneous Q8H  . insulin aspart  0-15 Units Subcutaneous TID WC  . insulin aspart  0-5 Units Subcutaneous QHS  . insulin glargine  20 Units Subcutaneous QHS  . Ipratropium-Albuterol  1 puff Inhalation Q6H  . linagliptin  5 mg Oral Daily  . mouth rinse  15 mL Mouth Rinse BID  . metoprolol succinate  25 mg Oral Daily  . mometasone-formoterol  2 puff Inhalation BID  . multivitamin with minerals  1 tablet Oral Daily  . pantoprazole  40 mg Oral Daily  . phosphorus  250 mg Oral TID  . vitamin C  500 mg Oral Daily  . zinc sulfate  220 mg Oral Daily   Continuous Infusions: . sodium chloride Stopped  (06/02/19 1132)  . ceFEPime (MAXIPIME) IV Stopped (06/02/19 1019)  . vancomycin Stopped (06/03/19 0047)    Data Reviewed: I have personally reviewed following labs and imaging studies  CBC: Recent Labs  Lab 05/31/19 1024 06/01/19 0447 06/02/19 0242 06/03/19 0232  WBC 11.8* 6.2 10.5 8.2  NEUTROABS 10.4* 5.6 9.8* 7.0  HGB 10.9* 10.1* 9.4* 8.8*  HCT 34.3* 31.9* 29.1* 27.5*  MCV 92.2 93.3 93.0 94.2  PLT 100* 98* 86* 79*   Basic Metabolic Panel: Recent Labs  Lab 06/01/19 0447 06/01/19 0830 06/01/19 1101 06/01/19 1647 06/02/19 0242 06/03/19 0232  NA  --  131* 131* 138 139 137  K  --  4.5 4.1 3.6 3.7 3.9  CL  --  96* 96* 106 106 105  CO2  --  20* 20* 21* 21* 22  GLUCOSE  --  573* 714* 245* 224* 173*  BUN  --  58* 64* 62* 55* 52*  CREATININE  --  2.05* 2.07* 1.69* 1.54* 1.49*  CALCIUM  --  8.6* 8.4* 8.5* 8.7* 8.3*  MG 2.8*  --   --   --  2.7* 2.5*  PHOS 4.1  --   --   --  3.7 2.2*   GFR: Estimated Creatinine Clearance: 30.8 mL/min (A) (by C-G formula based on SCr of 1.49 mg/dL (H)). Liver Function Tests: Recent Labs  Lab 05/31/19 1024 06/01/19 0830 06/02/19 0242 06/03/19 0232  AST 29 25 17 27   ALT 25 24 20 29   ALKPHOS 62 58 52 55  BILITOT 0.9 1.1 0.5 0.8  PROT 7.1 6.7 6.0* 5.9*  ALBUMIN 3.1* 2.9* 2.5* 2.5*   No results for input(s): LIPASE, AMYLASE in the last 168 hours. No results for input(s): AMMONIA in the last 168 hours. Coagulation Profile: No results for input(s): INR, PROTIME in the last 168 hours. Cardiac Enzymes: No results for input(s): CKTOTAL, CKMB, CKMBINDEX, TROPONINI in the last 168 hours. BNP (last 3 results) No results for input(s): PROBNP in the last 8760 hours. HbA1C: No results for input(s): HGBA1C in the last 72 hours. CBG: Recent Labs  Lab 06/02/19 1502 06/02/19 2100 06/02/19 2352 06/03/19 0450 06/03/19 0752  GLUCAP 153* 149* 134* 119* 80   Lipid Profile: No results for input(s): CHOL, HDL, LDLCALC, TRIG, CHOLHDL, LDLDIRECT  in the last 72 hours. Thyroid Function Tests: No results for input(s): TSH, T4TOTAL, FREET4, T3FREE, THYROIDAB in the last 72 hours. Anemia Panel: Recent Labs    06/02/19 0242 06/03/19 0232  FERRITIN 353*  203   Sepsis Labs: Recent Labs  Lab 05/31/19 1141 05/31/19 1341 05/31/19 1424  PROCALCITON  --   --  <0.10  LATICACIDVEN 1.2 1.0  --     Recent Results (from the past 240 hour(s))  Blood culture (routine x 2)     Status: Abnormal (Preliminary result)   Collection Time: 05/31/19 11:41 AM   Specimen: Right Antecubital; Blood  Result Value Ref Range Status   Specimen Description   Final    RIGHT ANTECUBITAL Performed at [facility] [address] [place], [place] [address]    Special Requests   Final    BOTTLES DRAWN AEROBIC AND ANAEROBIC Blood Culture adequate volume Performed at [facility] [address] [place], [place] [address]    Culture  Setup Time   Final    GRAM POSITIVE COCCI IN CLUSTERS ANAEROBIC BOTTLE ONLY CRITICAL RESULT CALLED TO, READ BACK BY AND VERIFIED WITH: Shelda Jakes PHARMD, AT 1738 06/01/19 BY D. Rico Sheehan POSITIVE RODS AEROBIC BOTTLE ONLY CRITICAL RESULT CALLED TO, READ BACK BY AND VERIFIED WITH: J LEGGE PHARMD 2006 06/02/19 A BROWNING    Culture (A)  Final    STAPHYLOCOCCUS SPECIES (COAGULASE NEGATIVE) THE SIGNIFICANCE OF ISOLATING THIS ORGANISM FROM A SINGLE SET OF BLOOD CULTURES WHEN MULTIPLE SETS ARE DRAWN IS UNCERTAIN. PLEASE NOTIFY THE MICROBIOLOGY DEPARTMENT WITHIN ONE WEEK IF SPECIATION AND SENSITIVITIES ARE REQUIRED. CULTURE REINCUBATED FOR BETTER GROWTH Performed at [facility], [place] [address], [place], [place] [address]    Report Status PENDING  Incomplete  Blood culture (routine x 2)     Status: None (Preliminary result)   Collection Time: 05/31/19 11:46 AM   Specimen: Left Antecubital; Blood  Result Value Ref Range Status   Specimen Description   Final    LEFT ANTECUBITAL  Performed at [facility] [address], [place], [place] [address]    Special Requests   Final    BOTTLES DRAWN AEROBIC AND ANAEROBIC Blood Culture adequate volume Performed at [facility] [address], [place], [place] [address]    Culture   Final    NO GROWTH 3 DAYS Performed at [facility], [place] [address], [place], [place] [address]    Report Status PENDING  Incomplete  MRSA PCR Screening     Status: None   Collection Time: 06/01/19 10:26 AM   Specimen: Nasal Mucosa; Nasopharyngeal  Result Value Ref Range Status   MRSA by PCR NEGATIVE NEGATIVE Final    Comment:        The GeneXpert MRSA Assay (FDA approved for NASAL specimens only), is one component of a comprehensive MRSA colonization surveillance program. It is not intended to diagnose MRSA infection nor to guide or monitor treatment for MRSA infections. Performed at [facility], [place] [address] [place], [place] [address]       Radiology Studies: No results found.    LOS: 3 days   Time spent: More than 50% of that time was spent in counseling and/or coordination of care.  Antonieta Pert, MD [facility]  06/03/2019, 10:02 AM

## 2019-06-04 ENCOUNTER — Encounter: Payer: Self-pay | Admitting: *Deleted

## 2019-06-04 DIAGNOSIS — I480 Paroxysmal atrial fibrillation: Secondary | ICD-10-CM

## 2019-06-04 DIAGNOSIS — D638 Anemia in other chronic diseases classified elsewhere: Secondary | ICD-10-CM

## 2019-06-04 DIAGNOSIS — Z8619 Personal history of other infectious and parasitic diseases: Secondary | ICD-10-CM

## 2019-06-04 DIAGNOSIS — J9621 Acute and chronic respiratory failure with hypoxia: Secondary | ICD-10-CM

## 2019-06-04 DIAGNOSIS — E1165 Type 2 diabetes mellitus with hyperglycemia: Secondary | ICD-10-CM

## 2019-06-04 DIAGNOSIS — Z951 Presence of aortocoronary bypass graft: Secondary | ICD-10-CM

## 2019-06-04 DIAGNOSIS — U071 COVID-19: Secondary | ICD-10-CM

## 2019-06-04 DIAGNOSIS — N189 Chronic kidney disease, unspecified: Secondary | ICD-10-CM

## 2019-06-04 DIAGNOSIS — N179 Acute kidney failure, unspecified: Secondary | ICD-10-CM

## 2019-06-04 LAB — COMPREHENSIVE METABOLIC PANEL WITH GFR
ALT: 22 U/L (ref 0–44)
AST: 15 U/L (ref 15–41)
Albumin: 2.1 g/dL — ABNORMAL LOW (ref 3.5–5.0)
Alkaline Phosphatase: 59 U/L (ref 38–126)
Anion gap: 9 (ref 5–15)
BUN: 43 mg/dL — ABNORMAL HIGH (ref 8–23)
CO2: 23 mmol/L (ref 22–32)
Calcium: 8 mg/dL — ABNORMAL LOW (ref 8.9–10.3)
Chloride: 106 mmol/L (ref 98–111)
Creatinine, Ser: 1.51 mg/dL — ABNORMAL HIGH (ref 0.44–1.00)
GFR calc Af Amer: 40 mL/min — ABNORMAL LOW
GFR calc non Af Amer: 34 mL/min — ABNORMAL LOW
Glucose, Bld: 72 mg/dL (ref 70–99)
Potassium: 3.8 mmol/L (ref 3.5–5.1)
Sodium: 138 mmol/L (ref 135–145)
Total Bilirubin: 0.7 mg/dL (ref 0.3–1.2)
Total Protein: 5.6 g/dL — ABNORMAL LOW (ref 6.5–8.1)

## 2019-06-04 LAB — GLUCOSE, CAPILLARY
Glucose-Capillary: 70 mg/dL (ref 70–99)
Glucose-Capillary: 83 mg/dL (ref 70–99)
Glucose-Capillary: 278 mg/dL — ABNORMAL HIGH (ref 70–99)
Glucose-Capillary: 110 mg/dL — ABNORMAL HIGH (ref 70–99)
Glucose-Capillary: 175 mg/dL — ABNORMAL HIGH (ref 70–99)

## 2019-06-04 LAB — CBC WITH DIFFERENTIAL/PLATELET
Abs Immature Granulocytes: 0.06 K/uL (ref 0.00–0.07)
Basophils Absolute: 0 K/uL (ref 0.0–0.1)
Basophils Relative: 0 %
Eosinophils Absolute: 0.1 K/uL (ref 0.0–0.5)
Eosinophils Relative: 1 %
HCT: 28.5 % — ABNORMAL LOW (ref 36.0–46.0)
Hemoglobin: 8.8 g/dL — ABNORMAL LOW (ref 12.0–15.0)
Immature Granulocytes: 1 %
Lymphocytes Relative: 18 %
Lymphs Abs: 1.7 K/uL (ref 0.7–4.0)
MCH: 29.1 pg (ref 26.0–34.0)
MCHC: 30.9 g/dL (ref 30.0–36.0)
MCV: 94.4 fL (ref 80.0–100.0)
Monocytes Absolute: 0.4 K/uL (ref 0.1–1.0)
Monocytes Relative: 4 %
Neutro Abs: 6.9 K/uL (ref 1.7–7.7)
Neutrophils Relative %: 76 %
Platelets: 97 K/uL — ABNORMAL LOW (ref 150–400)
RBC: 3.02 MIL/uL — ABNORMAL LOW (ref 3.87–5.11)
RDW: 13.8 % (ref 11.5–15.5)
WBC: 9.2 K/uL (ref 4.0–10.5)
nRBC: 0 % (ref 0.0–0.2)

## 2019-06-04 LAB — FERRITIN: Ferritin: 183 ng/mL (ref 11–307)

## 2019-06-04 LAB — PHOSPHORUS: Phosphorus: 2.9 mg/dL (ref 2.5–4.6)

## 2019-06-04 LAB — C-REACTIVE PROTEIN: CRP: 11.4 mg/dL — ABNORMAL HIGH

## 2019-06-04 LAB — D-DIMER, QUANTITATIVE: D-Dimer, Quant: 3.69 ug/mL-FEU — ABNORMAL HIGH (ref 0.00–0.50)

## 2019-06-04 LAB — MAGNESIUM: Magnesium: 2.3 mg/dL (ref 1.7–2.4)

## 2019-06-04 MED ORDER — INSULIN ASPART 100 UNIT/ML ~~LOC~~ SOLN: 4.0000 [IU] | Freq: Three times a day (TID) | SUBCUTANEOUS | Status: DC

## 2019-06-04 MED ORDER — INSULIN DETEMIR 100 UNIT/ML ~~LOC~~ SOLN
6.0000 [IU] | Freq: Every day | SUBCUTANEOUS | Status: DC
  Administered 2019-06-04 – 2019-06-05 (×2): 6 [IU] via SUBCUTANEOUS
  Filled 2019-06-04 (×3): qty 0.06

## 2019-06-04 MED ORDER — INSULIN ASPART 100 UNIT/ML ~~LOC~~ SOLN
0.0000 [IU] | Freq: Every day | SUBCUTANEOUS | Status: DC
  Administered 2019-06-06: 5 [IU] via SUBCUTANEOUS

## 2019-06-04 MED ORDER — INSULIN ASPART 100 UNIT/ML ~~LOC~~ SOLN
0.0000 [IU] | Freq: Three times a day (TID) | SUBCUTANEOUS | Status: DC
  Administered 2019-06-05: 5 [IU] via SUBCUTANEOUS
  Administered 2019-06-05: 2 [IU] via SUBCUTANEOUS
  Administered 2019-06-06: 3 [IU] via SUBCUTANEOUS
  Administered 2019-06-06: 5 [IU] via SUBCUTANEOUS
  Administered 2019-06-06: 09:00:00 3 [IU] via SUBCUTANEOUS
  Administered 2019-06-07: 15 [IU] via SUBCUTANEOUS
  Administered 2019-06-07: 8 [IU] via SUBCUTANEOUS

## 2019-06-04 MED ORDER — INSULIN ASPART 100 UNIT/ML ~~LOC~~ SOLN
3.0000 [IU] | Freq: Three times a day (TID) | SUBCUTANEOUS | Status: DC
  Administered 2019-06-05 – 2019-06-07 (×6): 3 [IU] via SUBCUTANEOUS

## 2019-06-04 MED ORDER — IPRATROPIUM-ALBUTEROL 20-100 MCG/ACT IN AERS
1.0000 | INHALATION_SPRAY | Freq: Three times a day (TID) | RESPIRATORY_TRACT | Status: DC
  Administered 2019-06-04 – 2019-06-10 (×19): 1 via RESPIRATORY_TRACT
  Filled 2019-06-04: qty 4

## 2019-06-04 NOTE — Progress Notes (Signed)
Discharge Progress Report  Patient Details  Name: Andrea [name] MRN: [id number] Date of Birth: 08-13-1945 Referring Provider:     Cardiac Rehab from 01/28/2019 in [facility]  Referring Provider  Chong Sicilian MD       Number of Visits: 33  Reason for Discharge:  Early Exit:  Readmission  Smoking History:  Social History   Tobacco Use  Smoking Status Never Smoker  Smokeless Tobacco Never Used    Diagnosis:  S/P CABG x 3  ADL UCSD:   Initial Exercise Prescription: Initial Exercise Prescription - 01/28/19 1600      Date of Initial Exercise RX and Referring Provider   Date  01/28/19    Referring Provider  Chong Sicilian MD      Oxygen   Oxygen  Continuous    Liters  2      Treadmill   MPH  1.3    Grade  0    Minutes  15    METs  1.2      NuStep   Level  2    SPM  60    Minutes  15    METs  2      Arm Ergometer   Level  1    Watts  15    RPM  10    Minutes  15    METs  1.2      Biostep-RELP   Level  2    SPM  50    Minutes  15    METs  2      Prescription Details   Frequency (times per week)  3    Duration  Progress to 30 minutes of continuous aerobic without signs/symptoms of physical distress      Intensity   THRR 40-80% of Max Heartrate  122-139    Ratings of Perceived Exertion  11-15    Perceived Dyspnea  0-4      Progression   Progression  Continue progressive overload as per policy without signs/symptoms or physical distress.      Resistance Training   Weight  3    Reps  10-15       Discharge Exercise Prescription (Final Exercise Prescription Changes): Exercise Prescription Changes - 05/07/19 0900      Response to Exercise   Blood Pressure (Admit)  150/70    Blood Pressure (Exercise)  140/62    Blood Pressure (Exit)  150/80    Heart Rate (Admit)  87 bpm    Heart Rate (Exercise)  114 bpm    Heart Rate (Exit)  97 bpm    Oxygen Saturation (Admit)  98 %    Oxygen Saturation (Exercise)  94 %    Oxygen  Saturation (Exit)  94 %    Rating of Perceived Exertion (Exercise)  12    Perceived Dyspnea (Exercise)  2    Symptoms  none    Duration  Continue with 30 min of aerobic exercise without signs/symptoms of physical distress.    Intensity  THRR unchanged      Progression   Progression  Continue to progress workloads to maintain intensity without signs/symptoms of physical distress.    Average METs  2.16      Resistance Training   Training Prescription  Yes    Weight  3 lb    Reps  10-15      Interval Training   Interval Training  No      Oxygen  Oxygen  Continuous    Liters  1      Treadmill   MPH  1.5    Grade  0    Minutes  15    METs  2.15      Recumbant Bike   Level  2    Minutes  15    METs  2.3      NuStep   Level  4    Minutes  15    METs  2.2      T5 Nustep   Level  4    Minutes  15    METs  1.8      Biostep-RELP   Level  1    Minutes  15    METs  2      Home Exercise Plan   Plans to continue exercise at  [employer] (comment)   [employer]   Frequency  Add 2 additional days to program exercise sessions.    Initial Home Exercises Provided  03/27/19       Functional Capacity: 6 Minute Walk    Row Name 01/28/19 1557 05/01/19 1559       6 Minute Walk   Phase  Initial  Discharge    Distance  660 feet  1100 feet    Distance % Change  -  67 %    Distance Feet Change  -  440 ft    Walk Time  4.48 minutes  6 minutes    # of Rest Breaks  1  0    MPH  1.2  2.08    METS  2  2.54    RPE  11  15    Perceived Dyspnea   1  3    VO2 Peak  7  8.9    Symptoms  Yes (comment)  Yes (comment)    Comments  Patient reports taking a break due to being tired and thirsty from wearing a mask.  leg fatigue    Resting HR  104 bpm  81 bpm    Resting BP  118/58  116/52    Resting Oxygen Saturation   92 %  95 %    Exercise Oxygen Saturation  during 6 min walk  97 %  91 %    Max Ex. HR  127 bpm  121 bpm    Max Ex. BP  146/70  132/50    2 Minute Post BP   120/58  -       Psychological, QOL, Others - Outcomes: PHQ 2/9: No flowsheet data found.  Quality of Life: Quality of Life - 05/20/19 0827      Quality of Life Scores   Health/Function Post  21.8 %       Personal Goals: Goals established at orientation with interventions provided to work toward goal. Personal Goals and Risk Factors at Admission - 12/17/18 1009      Core Components/Risk Factors/Patient Goals on Admission    Weight Management  Yes;Weight Loss    Intervention  Weight Management: Develop a combined nutrition and exercise program designed to reach desired caloric intake, while maintaining appropriate intake of nutrient and fiber, sodium and fats, and appropriate energy expenditure required for the weight goal.;Weight Management: Provide education and appropriate resources to help participant work on and attain dietary goals.    Expected Outcomes  Short Term: Continue to assess and modify interventions until short term weight is achieved;Long Term: Adherence to nutrition and physical activity/exercise program  aimed toward attainment of established weight goal;Weight Loss: Understanding of general recommendations for a balanced deficit meal plan, which promotes 1-2 lb weight loss per week and includes a negative energy balance of [phone] kcal/d;Understanding recommendations for meals to include 15-35% energy as protein, 25-35% energy from fat, 35-60% energy from carbohydrates, less than 233m of dietary cholesterol, 20-35 gm of total fiber daily;Understanding of distribution of calorie intake throughout the day with the consumption of 4-5 meals/snacks    Diabetes  Yes    Intervention  Provide education about signs/symptoms and action to take for hypo/hyperglycemia.;Provide education about proper nutrition, including hydration, and aerobic/resistive exercise prescription along with prescribed medications to achieve blood glucose in normal ranges: Fasting glucose 65-99 mg/dL     Expected Outcomes  Short Term: Participant verbalizes understanding of the signs/symptoms and immediate care of hyper/hypoglycemia, proper foot care and importance of medication, aerobic/resistive exercise and nutrition plan for blood glucose control.;Long Term: Attainment of HbA1C < 7%.    Hypertension  Yes    Intervention  Provide education on lifestyle modifcations including regular physical activity/exercise, weight management, moderate sodium restriction and increased consumption of fresh fruit, vegetables, and low fat dairy, alcohol moderation, and smoking cessation.;Monitor prescription use compliance.    Expected Outcomes  Short Term: Continued assessment and intervention until BP is < 140/995mHG in hypertensive participants. < 130/8054mG in hypertensive participants with diabetes, heart failure or chronic kidney disease.;Long Term: Maintenance of blood pressure at goal levels.    Lipids  Yes    Intervention  Provide education and support for participant on nutrition & aerobic/resistive exercise along with prescribed medications to achieve LDL <57m11mDL >40mg46m Expected Outcomes  Long Term: Cholesterol controlled with medications as prescribed, with individualized exercise RX and with personalized nutrition plan. Value goals: LDL < 57mg,68m > 40 mg.;Short Term: Participant states understanding of desired cholesterol values and is compliant with medications prescribed. Participant is following exercise prescription and nutrition guidelines.        Personal Goals Discharge: Goals and Risk Factor Review    Row Name 01/07/19 1112 01/20/19 1046 01/28/19 1612 02/20/19 1554 03/26/19 1544     Core Components/Risk Factors/Patient Goals Review   Personal Goals Review  Weight Management/Obesity;Hypertension  Weight Management/Obesity;Diabetes;Hypertension  Weight Management/Obesity;Diabetes;Hypertension  Weight Management/Obesity;Diabetes;Hypertension;Other  Weight  Management/Obesity;Hypertension;Diabetes;Lipids   Review  Her weight and blood pressures have been good and she has not noted any problems.  Her weight is a little up, but slowly. She is keeping an eye on it, but still wants to lose weight. Her current weight is 153lb and she wants to get down to 140 lb. Her blood pressure has been running well. She feels like she is managing her diabetes well.  Her weight is a little up, but slowly. She is keeping an eye on it, but still wants to lose weight. Her current weight is 153lb and she wants to get down to 140 lb. Her blood pressure has been running well. She feels like she is managing her diabetes well.  Patient reports no weight loss but that her clothes feel less tight and she thinks that she is firming up. Patient is determined to lose weight. Patient does have a blood pressure cuff at home and checks it once daily. BP has been elevated some when entering Cardiac Rehab but fine for exit BP. Patient checks blood glucose every morning and sometimes at night. Fasting blood sugar is in the normal range and night time blood sugar  is never over 200 mg/dL. Patient takes all of her medications the way the doctor has prescribed them. Patient said the doctor is taking her off of blood thinners this month.Patient really wants to become healthy enough to stop wearing oxygen.  Patient wants to be aroung 140 pounds. She weighed 159 pounds today. She is not sure what she is eating to cause her weight going up. She states she has been eating less. Her A1C is 7.7. She states it is better than when she had her heart surgury. [place] and Glipozide is what she is maintaining her blood sugars with. She checks her sugar at home regularly.   Expected Outcomes  Short and Long: Continue to monitor risk factors.  Short: come to Cardiac Rehab to learn more about risk factors. Long: become independent with managing risk factors.  Short: come to Cardiac Rehab to learn more about risk factors.  Long: become independent with managing risk factors.  Short: Patient wants to continue becoming stronger, lose weight, and needing less oxygen. Long: Patient wants to stop or decrease wearing oxygen by increasing her lung function with exercise.  Short: lose 5 pounds in two weeks. Long: maintain weight loss and continue to lose weight.      Exercise Goals and Review: Exercise Goals    Row Name 12/17/18 1014 01/28/19 1611           Exercise Goals   Increase Physical Activity  Yes  Yes      Intervention  Provide advice, education, support and counseling about physical activity/exercise needs.;Develop an individualized exercise prescription for aerobic and resistive training based on initial evaluation findings, risk stratification, comorbidities and participant's personal goals.  Provide advice, education, support and counseling about physical activity/exercise needs.;Develop an individualized exercise prescription for aerobic and resistive training based on initial evaluation findings, risk stratification, comorbidities and participant's personal goals.      Expected Outcomes  Long Term: Add in home exercise to make exercise part of routine and to increase amount of physical activity.;Short Term: Attend rehab on a regular basis to increase amount of physical activity.;Long Term: Exercising regularly at least 3-5 days a week.  Long Term: Add in home exercise to make exercise part of routine and to increase amount of physical activity.;Short Term: Attend rehab on a regular basis to increase amount of physical activity.;Long Term: Exercising regularly at least 3-5 days a week.      Increase Strength and Stamina  Yes  Yes      Intervention  Provide advice, education, support and counseling about physical activity/exercise needs.;Develop an individualized exercise prescription for aerobic and resistive training based on initial evaluation findings, risk stratification, comorbidities and participant's  personal goals.  Provide advice, education, support and counseling about physical activity/exercise needs.;Develop an individualized exercise prescription for aerobic and resistive training based on initial evaluation findings, risk stratification, comorbidities and participant's personal goals.      Expected Outcomes  Short Term: Increase workloads from initial exercise prescription for resistance, speed, and METs.;Short Term: Perform resistance training exercises routinely during rehab and add in resistance training at home;Long Term: Improve cardiorespiratory fitness, muscular endurance and strength as measured by increased METs and functional capacity (6MWT)  Short Term: Increase workloads from initial exercise prescription for resistance, speed, and METs.;Short Term: Perform resistance training exercises routinely during rehab and add in resistance training at home;Long Term: Improve cardiorespiratory fitness, muscular endurance and strength as measured by increased METs and functional capacity (6MWT)      Able  to understand and use rate of perceived exertion (RPE) scale  Yes  Yes      Intervention  Provide education and explanation on how to use RPE scale  Provide education and explanation on how to use RPE scale      Expected Outcomes  Short Term: Able to use RPE daily in rehab to express subjective intensity level;Long Term:  Able to use RPE to guide intensity level when exercising independently  Short Term: Able to use RPE daily in rehab to express subjective intensity level;Long Term:  Able to use RPE to guide intensity level when exercising independently      Able to understand and use Dyspnea scale  Yes  Yes      Intervention  Provide education and explanation on how to use Dyspnea scale  Provide education and explanation on how to use Dyspnea scale      Expected Outcomes  Short Term: Able to use Dyspnea scale daily in rehab to express subjective sense of shortness of breath during exertion;Long  Term: Able to use Dyspnea scale to guide intensity level when exercising independently  Short Term: Able to use Dyspnea scale daily in rehab to express subjective sense of shortness of breath during exertion;Long Term: Able to use Dyspnea scale to guide intensity level when exercising independently      Knowledge and understanding of Target Heart Rate Range (THRR)  Yes  Yes      Intervention  Provide education and explanation of THRR including how the numbers were predicted and where they are located for reference  Provide education and explanation of THRR including how the numbers were predicted and where they are located for reference      Expected Outcomes  Short Term: Able to state/look up THRR;Short Term: Able to use daily as guideline for intensity in rehab;Long Term: Able to use THRR to govern intensity when exercising independently  Short Term: Able to state/look up THRR;Short Term: Able to use daily as guideline for intensity in rehab;Long Term: Able to use THRR to govern intensity when exercising independently      Able to check pulse independently  Yes  Yes      Intervention  Provide education and demonstration on how to check pulse in carotid and radial arteries.;Review the importance of being able to check your own pulse for safety during independent exercise  Provide education and demonstration on how to check pulse in carotid and radial arteries.;Review the importance of being able to check your own pulse for safety during independent exercise      Expected Outcomes  Short Term: Able to explain why pulse checking is important during independent exercise;Long Term: Able to check pulse independently and accurately  Short Term: Able to explain why pulse checking is important during independent exercise;Long Term: Able to check pulse independently and accurately      Understanding of Exercise Prescription  Yes  Yes      Intervention  Provide education, explanation, and written materials on  patient's individual exercise prescription  Provide education, explanation, and written materials on patient's individual exercise prescription      Expected Outcomes  Short Term: Able to explain program exercise prescription;Long Term: Able to explain home exercise prescription to exercise independently  Short Term: Able to explain program exercise prescription;Long Term: Able to explain home exercise prescription to exercise independently         Exercise Goals Re-Evaluation: Exercise Goals Re-Evaluation    Row Name 01/07/19 1059 01/29/19 1658 02/14/19  1049 02/20/19 1605 02/25/19 1501     Exercise Goal Re-Evaluation   Exercise Goals Review  Increase Physical Activity;Increase Strength and Stamina;Understanding of Exercise Prescription  Able to understand and use rate of perceived exertion (RPE) scale;Knowledge and understanding of Target Heart Rate Range (THRR);Understanding of Exercise Prescription  Increase Physical Activity;Increase Strength and Stamina;Able to understand and use rate of perceived exertion (RPE) scale;Knowledge and understanding of Target Heart Rate Range (THRR);Able to check pulse independently;Understanding of Exercise Prescription  Increase Physical Activity;Increase Strength and Stamina;Able to understand and use rate of perceived exertion (RPE) scale;Knowledge and understanding of Target Heart Rate Range (THRR);Able to check pulse independently;Understanding of Exercise Prescription  Increase Physical Activity;Increase Strength and Stamina;Able to understand and use rate of perceived exertion (RPE) scale;Knowledge and understanding of Target Heart Rate Range (THRR);Able to check pulse independently;Understanding of Exercise Prescription   Comments  Aryanna has been exercising some.  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  She aims for at least 30 min every day!!  Overall, she is feeling good and eager to start rehab in  the gym and get back on a treadmill again.  Reviewed RPE scale, THR and program prescription with pt today.  Pt voiced understanding and was given a copy of goals to take home.  Lenice is doing well with exercise so far.  She works at prescribed RPE/HR ranges. Staff will monitor progress.  Shaleigh enjoys coming to Cardiac Rehab. Patient is very cheerful and is seeing positive. Patient has started driving. Patient was able to put her walker in the car and put oxygen in the car. Patient was unable to do any of that before the program. Patient reports that becoming easier all the time. Increasing workloads on machines weekly. Patient is not as breathless as last week in the program.  Markita is trying the TM for one of her stations.  She wants to get off oxygen.   Expected Outcomes  Short: Continue to at least walk daily.  Long: Get started with rehab in gym.  Short: Use RPE daily to regulate intensity. Long: Follow program prescription in THR.  Short - attend class consistently Long - improve overall MET level  Short- attending class regularly, walking, and riding bike at home. Long- exercising on days off for a total of 5 days per week  Short - build syamina walking Long - increase overall MET level and get off oxygen   Row Name 03/13/19 1546 03/25/19 1118 04/08/19 1118 04/22/19 1341 05/07/19 0914     Exercise Goal Re-Evaluation   Exercise Goals Review  Increase Physical Activity;Increase Strength and Stamina;Able to understand and use rate of perceived exertion (RPE) scale;Knowledge and understanding of Target Heart Rate Range (THRR);Able to check pulse independently;Understanding of Exercise Prescription  Increase Physical Activity;Increase Strength and Stamina;Understanding of Exercise Prescription  Increase Physical Activity;Increase Strength and Stamina;Understanding of Exercise Prescription  Increase Physical Activity;Increase Strength and Stamina;Able to understand and use rate of perceived exertion  (RPE) scale;Able to understand and use Dyspnea scale;Knowledge and understanding of Target Heart Rate Range (THRR);Able to check pulse independently;Understanding of Exercise Prescription  Increase Physical Activity;Increase Strength and Stamina;Understanding of Exercise Prescription   Comments  Allyanna has done well on the TM.  She has reduced oxygen to 1L and sats remained above 88.  Kasiya is doing well in rehab.  She continues to have good saturations on 1L of oxygen.  We will try  to her off her oxygen on the seated equipment.  If she does well, we will try to increase her workloads as well. We will continue to monitor her progress.  Niki has been doing well in rehab.  She is still using 1L most days.  Yesterday, she was very SOB as she was having small runs of VT.  She knows that exercise makes her feel better so she came to class to exercise.  We will continue to monitor her progress.  Aurielle oxygen has been staying 93 and above with exercise.  she attends consistently and works in [place] is doing well in rehab.  She improved her walk test by 67%!!!  She has come a long way and feeling better.  We will continue to monitor her progress.   Expected Outcomes  Short - continue to use TM Long increase MET level and get off oxygen  Short: Try room air on seated equipment.  Long: Continue to improve stamina.  Short: Continue to work on breathing better.  Long: Continue to improve stamina.  Short - continue to attend consistently Long : be able to exercise without supplemental oxygen  Short: Graduate!!  Long: Continue to walk independently      Nutrition & Weight - Outcomes:    Nutrition: Nutrition Therapy & Goals - 01/28/19 1340      Nutrition Therapy   Diet  Low Na, HH, DM diet    Protein (specify units)  60-65g    Fiber  25 grams    Whole Grain Foods  3 servings    Saturated Fats  12 max. grams    Fruits and Vegetables  5 servings/day    Sodium  1.5 grams      Personal  Nutrition Goals   Nutrition Goal  ST: Adding snacks in between meals to reduce mindless eating LT: breath without O2 and lose 15 lbs (155-->140 lbs)    Comments  Pt on Lasix. Pt reports eating cheerios with 2% milk (wants to change to almond milk but doesn't like it, talked about soymilk) OR frozen pancakes (every 1-2 weeks) OR oatmeal and decaf coffee (they told her in the hospital not the have caffiene but is unsure if thats still true, told her to call her doctor and ask). Pt will drink diet soda. L chicken salad sandwich on whole wheat (used to be tomato, now cant tolerate tomatoes) with some cheetos (puffed). Will snack on cheetos now in quarentine. D chicken (doesn't like other meat) or pinto bean and broccoli casserole. Will watch her Na and says her dr told her that her numbers are fine. Pt would like to work on the amount of snacks she eats due to boredom ; discussed midful eating and halthy snacking. Discussed HH and DM eating. Discussed increased protein and calorie needs due to her pulmonary issues and maintaining muscle mass. Pt reports sister will shop for her and is trying to get her to buy [facility] and DM friendly foods, just got her to stop bringing sweets.      Intervention Plan   Intervention  Prescribe, educate and counsel regarding individualized specific dietary modifications aiming towards targeted core components such as weight, hypertension, lipid management, diabetes, heart failure and other comorbidities.;Nutrition handout(s) given to patient.    Expected Outcomes  Short Term Goal: Understand basic principles of dietary content, such as calories, fat, sodium, cholesterol and nutrients.;Short Term Goal: A plan has been developed with personal nutrition goals set during dietitian appointment.;Long Term  Goal: Adherence to prescribed nutrition plan.       Nutrition Discharge: Nutrition Assessments - 05/20/19 0827      MEDFICTS Scores   Post Score  32       Education Questionnaire  Score: Knowledge Questionnaire Score - 05/16/19 0837      Knowledge Questionnaire Score   Post Score  24/26       Goals reviewed with patient; copy given to patient.

## 2019-06-04 NOTE — Progress Notes (Signed)
Cardiac Individual Treatment Plan  Patient Details  Name: Andrea [name] MRN: [id number] Date of Birth: 1945/10/08 Referring Provider:     Cardiac Rehab from 01/28/2019 in [facility] Cardiac and Pulmonary Rehab  Referring Provider  Chong Sicilian MD      Initial Encounter Date:    Cardiac Rehab from 01/28/2019 in [facility] Cardiac and Pulmonary Rehab  Date  01/28/19      Visit Diagnosis: S/P CABG x 3  Patient's Home Medications on Admission: No current facility-administered medications for this visit.  No current outpatient medications on file.  Facility-Administered Medications Ordered in Other Visits:  .  0.9 %  sodium chloride infusion, , Intravenous, PRN, Antonieta Pert, MD, Stopped at 06/04/19 1108 .  aspirin EC tablet 81 mg, 81 mg, Oral, Daily, Kc, Ramesh, MD, 81 mg at 06/04/19 1030 .  atorvastatin (LIPITOR) tablet 40 mg, 40 mg, Oral, QHS, Kc, Ramesh, MD, 40 mg at 06/03/19 2050 .  Chlorhexidine Gluconate Cloth 2 % PADS 6 each, 6 each, Topical, Daily, Kc, Ramesh, MD, 6 each at 06/04/19 1023 .  chlorpheniramine-HYDROcodone (TUSSIONEX) 10-8 MG/5ML suspension 5 mL, 5 mL, Oral, Q12H PRN, Georgette Shell, MD .  dextrose 50 % solution 0-50 mL, 0-50 mL, Intravenous, PRN, Kc, Ramesh, MD .  ezetimibe (ZETIA) tablet 10 mg, 10 mg, Oral, Daily, Kc, Ramesh, MD, 10 mg at 06/04/19 1028 .  fluticasone (FLONASE) 50 MCG/ACT nasal spray 1 spray, 1 spray, Each Nare, Daily, Kc, Ramesh, MD, 1 spray at 06/04/19 1022 .  glipiZIDE (GLUCOTROL) tablet 10 mg, 10 mg, Oral, BID AC, Kc, Ramesh, MD, 10 mg at 06/04/19 0747 .  guaiFENesin-dextromethorphan (ROBITUSSIN DM) 100-10 MG/5ML syrup 10 mL, 10 mL, Oral, Q4H PRN, Georgette Shell, MD, 10 mL at 06/04/19 1029 .  heparin injection 5,000 Units, 5,000 Units, Subcutaneous, Q8H, Georgette Shell, MD, 5,000 Units at 06/04/19 [id number] .  insulin aspart (novoLOG) injection 0-15 Units, 0-15 Units, Subcutaneous, TID WC, Gonfa, Taye T, MD .  insulin aspart (novoLOG) injection  0-5 Units, 0-5 Units, Subcutaneous, QHS, Gonfa, Taye T, MD .  insulin aspart (novoLOG) injection 4 Units, 4 Units, Subcutaneous, TID WC, Gonfa, Taye T, MD .  insulin detemir (LEVEMIR) injection 6 Units, 6 Units, Subcutaneous, QHS, Gonfa, Taye T, MD .  Ipratropium-Albuterol (COMBIVENT) respimat 1 puff, 1 puff, Inhalation, TID, Kc, Ramesh, MD, 1 puff at 06/04/19 0748 .  linagliptin (TRADJENTA) tablet 5 mg, 5 mg, Oral, Daily, Kc, Ramesh, MD, 5 mg at 06/04/19 1027 .  MEDLINE mouth rinse, 15 mL, Mouth Rinse, BID, Kc, Ramesh, MD, 15 mL at 06/03/19 2053 .  metoprolol succinate (TOPROL-XL) 24 hr tablet 25 mg, 25 mg, Oral, Daily, Kc, Ramesh, MD, 25 mg at 06/04/19 1026 .  mometasone-formoterol (DULERA) 200-5 MCG/ACT inhaler 2 puff, 2 puff, Inhalation, BID, Kc, Ramesh, MD, 2 puff at 06/04/19 0748 .  multivitamin with minerals tablet 1 tablet, 1 tablet, Oral, Daily, Kc, Ramesh, MD, 1 tablet at 06/04/19 1025 .  pantoprazole (PROTONIX) EC tablet 40 mg, 40 mg, Oral, Daily, Kc, Ramesh, MD, 40 mg at 06/04/19 1026 .  phosphorus (K PHOS NEUTRAL) tablet 250 mg, 250 mg, Oral, TID, Kc, Ramesh, MD, 250 mg at 06/04/19 1029 .  vitamin C (ASCORBIC ACID) tablet 500 mg, 500 mg, Oral, Daily, Georgette Shell, MD, 500 mg at 06/04/19 1031 .  zinc sulfate capsule 220 mg, 220 mg, Oral, Daily, Georgette Shell, MD, 220 mg at 06/04/19 1026  Past Medical History: Past Medical History:  Diagnosis Date  .  COPD (chronic obstructive pulmonary disease) ([place])   . Diabetes mellitus without complication ([place])   . Heart attack ([place])   . Renal disorder     Tobacco Use: Social History   Tobacco Use  Smoking Status Never Smoker  Smokeless Tobacco Never Used    Labs: Recent Review Flowsheet Data    Labs for ITP Cardiac and Pulmonary Rehab Latest Ref Rng & Units 09/21/2017 11/16/2017 05/15/2019 05/16/2019   Trlycerides <150 mg/dL - - 110 -   Hemoglobin A1c 4.8 - 5.6 % 7.2(H) 7.3(H) - 8.9(H)       Exercise Target  Goals: Exercise Program Goal: Individual exercise prescription set using results from initial 6 min walk test and THRR while considering  patient's activity barriers and safety.   Exercise Prescription Goal: Initial exercise prescription builds to 30-45 minutes a day of aerobic activity, 2-3 days per week.  Home exercise guidelines will be given to patient during program as part of exercise prescription that the participant will acknowledge.  Activity Barriers & Risk Stratification: Activity Barriers & Cardiac Risk Stratification - 12/17/18 1006      Activity Barriers & Cardiac Risk Stratification   Activity Barriers  Balance Concerns;History of Falls;Assistive Device;Other (comment);Deconditioning;Muscular Weakness    Comments  Currently wearing brace for foot after concentator fell on it, using walker    Cardiac Risk Stratification  High       6 Minute Walk: 6 Minute Walk    Row Name 01/28/19 1557 05/01/19 1559       6 Minute Walk   Phase  Initial  Discharge    Distance  660 feet  1100 feet    Distance % Change  -  67 %    Distance Feet Change  -  440 ft    Walk Time  4.48 minutes  6 minutes    # of Rest Breaks  1  0    MPH  1.2  2.08    METS  2  2.54    RPE  11  15    Perceived Dyspnea   1  3    VO2 Peak  7  8.9    Symptoms  Yes (comment)  Yes (comment)    Comments  Patient reports taking a break due to being tired and thirsty from wearing a mask.  leg fatigue    Resting HR  104 bpm  81 bpm    Resting BP  118/58  116/52    Resting [name] Saturation   92 %  95 %    Exercise [name] Saturation  during 6 min walk  97 %  91 %    Max Ex. HR  127 bpm  121 bpm    Max Ex. BP  146/70  132/50    2 Minute Post BP  120/58  -       [name] Initial Assessment: [name] Initial Assessment - 12/17/18 1009      Home [name]   Home [name] Device  Home Concentrator;E-Tanks;Portable Concentrator    Sleep [name] Prescription  Continuous    Liters per minute  2    Home Exercise [name]  Prescription  Pulsed    Liters per minute  2    Home at Rest Exercise [name] Prescription  None    Compliance with Home [name] Use  Yes      Program [name] Prescription   Program [name] Prescription  Portable Concentrator;Pulsed    Liters per minute  2  Intervention   Short Term Goals  To learn and exhibit compliance with exercise, home and travel O2 prescription;To learn and understand importance of monitoring SPO2 with pulse oximeter and demonstrate accurate use of the pulse oximeter.;To learn and understand importance of maintaining [name] saturations>88%;To learn and demonstrate proper pursed lip breathing techniques or other breathing techniques.;To learn and demonstrate proper use of respiratory medications    Long  Term Goals  Exhibits compliance with exercise, home and travel O2 prescription;Verbalizes importance of monitoring SPO2 with pulse oximeter and return demonstration;Maintenance of O2 saturations>88%;Compliance with respiratory medication;Exhibits proper breathing techniques, such as pursed lip breathing or other method taught during program session;Demonstrates proper use of MDI's       [name] Re-Evaluation: [name] Re-Evaluation    Row Name 01/29/19 1700 03/26/19 1534           Program [name] Prescription   Program [name] Prescription  None;E-Tanks;Continuous  E-Tanks      Liters per minute  2  1      Comments  -  patient has been using 1 liter and has been trying to come off [name] if she can.        Home [name]   Home [name] Device  Portable Concentrator;Home Concentrator;E-Tanks  Portable Concentrator;Home Concentrator;E-Tanks      Sleep [name] Prescription  Continuous  Continuous      Liters per minute  2  2      Home Exercise [name] Prescription  Continuous  Continuous      Liters per minute  2  2      Home at Rest Exercise [name] Prescription  None  None no [name] at rest      Compliance with Home [name] Use  Yes  Yes        Goals/Expected Outcomes    Short Term Goals  To learn and understand importance of monitoring SPO2 with pulse oximeter and demonstrate accurate use of the pulse oximeter.;To learn and understand importance of maintaining [name] saturations>88%;To learn and demonstrate proper pursed lip breathing techniques or other breathing techniques.;To learn and exhibit compliance with exercise, home and travel O2 prescription  To learn and demonstrate proper use of respiratory medications;To learn and demonstrate proper pursed lip breathing techniques or other breathing techniques.;To learn and understand importance of maintaining [name] saturations>88%;To learn and understand importance of monitoring SPO2 with pulse oximeter and demonstrate accurate use of the pulse oximeter.;To learn and exhibit compliance with exercise, home and travel O2 prescription      Long  Term Goals  Compliance with respiratory medication;Exhibits proper breathing techniques, such as pursed lip breathing or other method taught during program session;Maintenance of O2 saturations>88%;Verbalizes importance of monitoring SPO2 with pulse oximeter and return demonstration;Exhibits compliance with exercise, home and travel O2 prescription  Demonstrates proper use of MDI's;Compliance with respiratory medication;Exhibits proper breathing techniques, such as pursed lip breathing or other method taught during program session;Maintenance of O2 saturations>88%;Verbalizes importance of monitoring SPO2 with pulse oximeter and return demonstration;Exhibits compliance with exercise, home and travel O2 prescription      Comments  Reviewed PLB technique with pt.  Talked about how it work and it's important to maintaining his exercise saturations.  Patient has a pulse ox at home and checks her [name] when she is short of breath. She is going to try to check her [name] when she wakes up. She sleeps with it but sometimes it comes off. She states she is taking her medications regularly and  does not need and  help or information on them.      Goals/Expected Outcomes  Short: Become more profiecient at using PLB.   Long: Become independent at using PLB.  Short: monitor [name] when she wakes up. Long: maintain [name] saturation at home independently.         [name] Discharge (Final [name] Re-Evaluation): [name] Re-Evaluation - 03/26/19 1534      Program [name] Prescription   Program [name] Prescription  E-Tanks    Liters per minute  1    Comments  patient has been using 1 liter and has been trying to come off [name] if she can.      Home [name]   Home [name] Device  Portable Concentrator;Home Concentrator;E-Tanks    Sleep [name] Prescription  Continuous    Liters per minute  2    Home Exercise [name] Prescription  Continuous    Liters per minute  2    Home at Rest Exercise [name] Prescription  None   no [name] at rest   Compliance with Home [name] Use  Yes      Goals/Expected Outcomes   Short Term Goals  To learn and demonstrate proper use of respiratory medications;To learn and demonstrate proper pursed lip breathing techniques or other breathing techniques.;To learn and understand importance of maintaining [name] saturations>88%;To learn and understand importance of monitoring SPO2 with pulse oximeter and demonstrate accurate use of the pulse oximeter.;To learn and exhibit compliance with exercise, home and travel O2 prescription    Long  Term Goals  Demonstrates proper use of MDI's;Compliance with respiratory medication;Exhibits proper breathing techniques, such as pursed lip breathing or other method taught during program session;Maintenance of O2 saturations>88%;Verbalizes importance of monitoring SPO2 with pulse oximeter and return demonstration;Exhibits compliance with exercise, home and travel O2 prescription    Comments  Patient has a pulse ox at home and checks her [name] when she is short of breath. She is going to try to check her [name] when she wakes up. She sleeps  with it but sometimes it comes off. She states she is taking her medications regularly and does not need and help or information on them.    Goals/Expected Outcomes  Short: monitor [name] when she wakes up. Long: maintain [name] saturation at home independently.       Initial Exercise Prescription: Initial Exercise Prescription - 01/28/19 1600      Date of Initial Exercise RX and Referring Provider   Date  01/28/19    Referring Provider  Chong Sicilian MD      [name]   [name]  Continuous    Liters  2      Treadmill   MPH  1.3    Grade  0    Minutes  15    METs  1.2      NuStep   Level  2    SPM  60    Minutes  15    METs  2      Arm Ergometer   Level  1    Watts  15    RPM  10    Minutes  15    METs  1.2      Biostep-RELP   Level  2    SPM  50    Minutes  15    METs  2      Prescription Details   Frequency (times per week)  3    Duration  Progress to 30 minutes of continuous aerobic without signs/symptoms of physical  distress      Intensity   THRR 40-80% of Max Heartrate  122-139    Ratings of Perceived Exertion  11-15    Perceived Dyspnea  0-4      Progression   Progression  Continue progressive overload as per policy without signs/symptoms or physical distress.      Resistance Training   Weight  3    Reps  10-15       Perform Capillary Blood Glucose checks as needed.  Exercise Prescription Changes: Exercise Prescription Changes    Row Name 01/28/19 1600 02/14/19 1000 02/25/19 1400 03/13/19 1500 03/25/19 1100     Response to Exercise   Blood Pressure (Admit)  118/58  128/60  132/60  138/60  122/56   Blood Pressure (Exercise)  146/70  140/60  164/60  142/60  152/60   Blood Pressure (Exit)  120/58  130/58  130/60  128/58  130/58   Heart Rate (Admit)  104 bpm  88 bpm  65 bpm  85 bpm  94 bpm   Heart Rate (Exercise)  127 bpm  119 bpm  130 bpm  128 bpm  112 bpm   Heart Rate (Exit)  89 bpm  86 bpm  99 bpm  109 bpm  103 bpm   [name] Saturation (Admit)   92 %  -  -  -  -   [name] Saturation (Exercise)  97 %  -  -  -  -   [name] Saturation (Exit)  95 %  -  -  -  -   Rating of Perceived Exertion (Exercise)  11  15  11  13  12    Perceived Dyspnea (Exercise)  1  -  -  -  0   Symptoms  - Patient stopped to rest because she felt tired and thirsty.  -  -  -  none   Duration  Progress to 30 minutes of  aerobic without signs/symptoms of physical distress  Continue with 30 min of aerobic exercise without signs/symptoms of physical distress.  Continue with 30 min of aerobic exercise without signs/symptoms of physical distress.  Continue with 30 min of aerobic exercise without signs/symptoms of physical distress.  Continue with 30 min of aerobic exercise without signs/symptoms of physical distress.   Intensity  THRR New  THRR unchanged  THRR unchanged  THRR unchanged  THRR unchanged     Progression   Progression  Continue to progress workloads to maintain intensity without signs/symptoms of physical distress.  Continue to progress workloads to maintain intensity without signs/symptoms of physical distress.  Continue to progress workloads to maintain intensity without signs/symptoms of physical distress.  Continue to progress workloads to maintain intensity without signs/symptoms of physical distress.  Continue to progress workloads to maintain intensity without signs/symptoms of physical distress.   Average METs  -  -  2.4  2.6  2.16     Resistance Training   Training Prescription  -  Yes  Yes  Yes  Yes   Weight  -  3 lb  3 lb  3 lb  3 lbs   Reps  -  10-15  10-15  10-15  10-15     Interval Training   Interval Training  -  -  -  -  No     [name]   [name]  -  Continuous  Continuous  Continuous  Continuous   Liters  -  2  2  1   1  Treadmill   MPH  -  -  -  1  1.5   Grade  -  -  -  0  0   Minutes  -  -  -  15  15   METs  -  -  -  1.77  2.15     Recumbant Bike   Level  -  2  2  2  2    RPM  -  -  -  60  -   Watts  -  -  -  25  -   Minutes  -  15   15  15  15    METs  -  3.11  3.11  2.3  2     NuStep   Level  -  2  2  3  4    SPM  -  60  60  60  -   Minutes  -  15  15  15  15    METs  -  -  1.9  1.4  2.4     T5 Nustep   Level  -  -  -  -  4   Minutes  -  -  -  -  15   METs  -  -  -  -  2.1   Row Name 03/27/19 1600 04/08/19 1100 04/22/19 1300 05/07/19 0900       Response to Exercise   Blood Pressure (Admit)  -  134/54  132/58  150/70    Blood Pressure (Exercise)  -  160/58  148/58  140/62    Blood Pressure (Exit)  -  130/56  120/58  150/80    Heart Rate (Admit)  -  109 bpm  88 bpm  87 bpm    Heart Rate (Exercise)  -  110 bpm  112 bpm  114 bpm    Heart Rate (Exit)  -  103 bpm  94 bpm  97 bpm    [name] Saturation (Admit)  -  95 %  -  98 %    [name] Saturation (Exercise)  -  93 %  -  94 %    [name] Saturation (Exit)  -  94 %  -  94 %    Rating of Perceived Exertion (Exercise)  -  12  15  12     Perceived Dyspnea (Exercise)  -  -  -  2    Symptoms  -  SOB with VT runs  -  none    Duration  -  Continue with 30 min of aerobic exercise without signs/symptoms of physical distress.  Continue with 30 min of aerobic exercise without signs/symptoms of physical distress.  Continue with 30 min of aerobic exercise without signs/symptoms of physical distress.    Intensity  -  THRR unchanged  THRR unchanged  THRR unchanged      Progression   Progression  -  Continue to progress workloads to maintain intensity without signs/symptoms of physical distress.  Continue to progress workloads to maintain intensity without signs/symptoms of physical distress.  Continue to progress workloads to maintain intensity without signs/symptoms of physical distress.    Average METs  -  2.78  2.7  2.16      Resistance Training   Training Prescription  -  Yes  Yes  Yes    Weight  -  3 lbs  3 lb  3 lb    Reps  -  10-15  10-15  10-15      Interval Training   Interval Training  -  No  No  No      [name]   [name]  -  -  Continuous  Continuous    Liters  -  -  1   1      Treadmill   MPH  -  1.5  1.5  1.5    Grade  -  0  0  0    Minutes  -  15  15  15     METs  -  2.15  2.15  2.15      Recumbant Bike   Level  -  2  2  2     Minutes  -  15  15  15     METs  -  2  3.08  2.3      NuStep   Level  -  4  4  4     Minutes  -  15  15  15     METs  -  2.4  2.3  2.2      T5 Nustep   Level  -  4  -  4    Minutes  -  15  -  15    METs  -  1.8  -  1.8      Biostep-RELP   Level  -  -  -  1    Minutes  -  -  -  15    METs  -  -  -  2      Home Exercise Plan   Plans to continue exercise at  [employer] (comment) Scientist, research (medical) (comment) Scientist, research (medical) (comment) Scientist, research (medical) (comment) [employer]    Frequency  Add 1 additional day to program exercise sessions.  Add 2 additional days to program exercise sessions.  Add 2 additional days to program exercise sessions.  Add 2 additional days to program exercise sessions.    Initial Home Exercises Provided  03/27/19  03/27/19  03/27/19  03/27/19       Exercise Comments: Exercise Comments    Row Name 12/17/18 1016 01/29/19 1658 02/12/19 1651       Exercise Comments  Pt would like to work towards coming off her [name]  First full day of exercise!  Patient was oriented to gym and equipment including functions, settings, policies, and procedures.  Patient's individual exercise prescription and treatment plan were reviewed.  All starting workloads were established based on the results of the 6 minute walk test done at initial orientation visit.  The plan for exercise progression was also introduced and progression will be customized based on patient's performance and goals.  Andrea [name] has been moving. 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   Some SOB and need for rescue inhaler with REC Bike use. Was able to complete exercise session without further problems.        Exercise Goals and Review: Exercise Goals    Row Name  12/17/18 1014 01/28/19 1611           Exercise Goals   Increase Physical Activity  Yes  Yes      Intervention  Provide advice, education, support and counseling about physical activity/exercise needs.;Develop an individualized exercise prescription for aerobic and resistive training based on initial evaluation findings, risk stratification, comorbidities and participant's personal goals.  Provide advice, education, support and  counseling about physical activity/exercise needs.;Develop an individualized exercise prescription for aerobic and resistive training based on initial evaluation findings, risk stratification, comorbidities and participant's personal goals.      Expected Outcomes  Long Term: Add in home exercise to make exercise part of routine and to increase amount of physical activity.;Short Term: Attend rehab on a regular basis to increase amount of physical activity.;Long Term: Exercising regularly at least 3-5 days a week.  Long Term: Add in home exercise to make exercise part of routine and to increase amount of physical activity.;Short Term: Attend rehab on a regular basis to increase amount of physical activity.;Long Term: Exercising regularly at least 3-5 days a week.      Increase Strength and Stamina  Yes  Yes      Intervention  Provide advice, education, support and counseling about physical activity/exercise needs.;Develop an individualized exercise prescription for aerobic and resistive training based on initial evaluation findings, risk stratification, comorbidities and participant's personal goals.  Provide advice, education, support and counseling about physical activity/exercise needs.;Develop an individualized exercise prescription for aerobic and resistive training based on initial evaluation findings, risk stratification, comorbidities and participant's personal goals.      Expected Outcomes  Short Term: Increase workloads from initial exercise prescription for resistance,  speed, and METs.;Short Term: Perform resistance training exercises routinely during rehab and add in resistance training at home;Long Term: Improve cardiorespiratory fitness, muscular endurance and strength as measured by increased METs and functional capacity (6MWT)  Short Term: Increase workloads from initial exercise prescription for resistance, speed, and METs.;Short Term: Perform resistance training exercises routinely during rehab and add in resistance training at home;Long Term: Improve cardiorespiratory fitness, muscular endurance and strength as measured by increased METs and functional capacity (6MWT)      Able to understand and use rate of perceived exertion (RPE) scale  Yes  Yes      Intervention  Provide education and explanation on how to use RPE scale  Provide education and explanation on how to use RPE scale      Expected Outcomes  Short Term: Able to use RPE daily in rehab to express subjective intensity level;Long Term:  Able to use RPE to guide intensity level when exercising independently  Short Term: Able to use RPE daily in rehab to express subjective intensity level;Long Term:  Able to use RPE to guide intensity level when exercising independently      Able to understand and use Dyspnea scale  Yes  Yes      Intervention  Provide education and explanation on how to use Dyspnea scale  Provide education and explanation on how to use Dyspnea scale      Expected Outcomes  Short Term: Able to use Dyspnea scale daily in rehab to express subjective sense of shortness of breath during exertion;Long Term: Able to use Dyspnea scale to guide intensity level when exercising independently  Short Term: Able to use Dyspnea scale daily in rehab to express subjective sense of shortness of breath during exertion;Long Term: Able to use Dyspnea scale to guide intensity level when exercising independently      Knowledge and understanding of Target Heart Rate Range (THRR)  Yes  Yes      Intervention  Provide  education and explanation of THRR including how the numbers were predicted and where they are located for reference  Provide education and explanation of THRR including how the numbers were predicted and where they are located for reference  Expected Outcomes  Short Term: Able to state/look up THRR;Short Term: Able to use daily as guideline for intensity in rehab;Long Term: Able to use THRR to govern intensity when exercising independently  Short Term: Able to state/look up THRR;Short Term: Able to use daily as guideline for intensity in rehab;Long Term: Able to use THRR to govern intensity when exercising independently      Able to check pulse independently  Yes  Yes      Intervention  Provide education and demonstration on how to check pulse in carotid and radial arteries.;Review the importance of being able to check your own pulse for safety during independent exercise  Provide education and demonstration on how to check pulse in carotid and radial arteries.;Review the importance of being able to check your own pulse for safety during independent exercise      Expected Outcomes  Short Term: Able to explain why pulse checking is important during independent exercise;Long Term: Able to check pulse independently and accurately  Short Term: Able to explain why pulse checking is important during independent exercise;Long Term: Able to check pulse independently and accurately      Understanding of Exercise Prescription  Yes  Yes      Intervention  Provide education, explanation, and written materials on patient's individual exercise prescription  Provide education, explanation, and written materials on patient's individual exercise prescription      Expected Outcomes  Short Term: Able to explain program exercise prescription;Long Term: Able to explain home exercise prescription to exercise independently  Short Term: Able to explain program exercise prescription;Long Term: Able to explain home exercise  prescription to exercise independently         Exercise Goals Re-Evaluation : Exercise Goals Re-Evaluation    Row Name 01/07/19 1059 01/29/19 1658 02/14/19 1049 02/20/19 1605 02/25/19 1501     Exercise Goal Re-Evaluation   Exercise Goals Review  Increase Physical Activity;Increase Strength and Stamina;Understanding of Exercise Prescription  Able to understand and use rate of perceived exertion (RPE) scale;Knowledge and understanding of Target Heart Rate Range (THRR);Understanding of Exercise Prescription  Increase Physical Activity;Increase Strength and Stamina;Able to understand and use rate of perceived exertion (RPE) scale;Knowledge and understanding of Target Heart Rate Range (THRR);Able to check pulse independently;Understanding of Exercise Prescription  Increase Physical Activity;Increase Strength and Stamina;Able to understand and use rate of perceived exertion (RPE) scale;Knowledge and understanding of Target Heart Rate Range (THRR);Able to check pulse independently;Understanding of Exercise Prescription  Increase Physical Activity;Increase Strength and Stamina;Able to understand and use rate of perceived exertion (RPE) scale;Knowledge and understanding of Target Heart Rate Range (THRR);Able to check pulse independently;Understanding of Exercise Prescription   Comments  Andrea [name] has been exercising some.  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  She aims for at least 30 min every day!!  Overall, she is feeling good and eager to start rehab in the gym and get back on a treadmill again.  Reviewed RPE scale, THR and program prescription with pt today.  Pt voiced understanding and was given a copy of goals to take home.  Andrea [name] is doing well with exercise so far.  She works at prescribed RPE/HR ranges. Staff will monitor progress.  Andrea [name] enjoys coming to Cardiac Rehab. Patient is very cheerful and is seeing positive. Patient has started  driving. Patient was able to put her walker in the car and put [name] in the car. Patient was unable to  do any of that before the program. Patient reports that becoming easier all the time. Increasing workloads on machines weekly. Patient is not as breathless as last week in the program.  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  She wants to get off [name].   Expected Outcomes  Short: Continue to at least walk daily.  Long: Get started with rehab in gym.  Short: Use RPE daily to regulate intensity. Long: Follow program prescription in THR.  Short - attend class consistently Long - improve overall MET level  Short- attending class regularly, walking, and riding bike at home. Long- exercising on days off for a total of 5 days per week  Short - build syamina walking Long - increase overall MET level and get off [name]   Row Name 03/13/19 1546 03/25/19 1118 04/08/19 1118 04/22/19 1341 05/07/19 0914     Exercise Goal Re-Evaluation   Exercise Goals Review  Increase Physical Activity;Increase Strength and Stamina;Able to understand and use rate of perceived exertion (RPE) scale;Knowledge and understanding of Target Heart Rate Range (THRR);Able to check pulse independently;Understanding of Exercise Prescription  Increase Physical Activity;Increase Strength and Stamina;Understanding of Exercise Prescription  Increase Physical Activity;Increase Strength and Stamina;Understanding of Exercise Prescription  Increase Physical Activity;Increase Strength and Stamina;Able to understand and use rate of perceived exertion (RPE) scale;Able to understand and use Dyspnea scale;Knowledge and understanding of Target Heart Rate Range (THRR);Able to check pulse independently;Understanding of Exercise Prescription  Increase Physical Activity;Increase Strength and Stamina;Understanding of Exercise Prescription   Comments  Andrea [name] has done well on the TM.  She has reduced [name] to 1L and sats remained above 88.  Andrea [name] is  doing well in rehab.  She continues to have good saturations on 1L of [name].  We will try to her off her [name] on the seated equipment.  If she does well, we will try to increase her workloads as well. We will continue to monitor her progress.  Andrea [name] has been doing well in rehab.  She is still using 1L most days.  Yesterday, she was very SOB as she was having small runs of VT.  She knows that exercise makes her feel better so she came to class to exercise.  We will continue to monitor her progress.  Andrea [name] has been staying 93 and above with exercise.  she attends consistently and works in [employer] is doing well in rehab.  She improved her walk test by 67%!!!  She has come a long way and feeling better.  We will continue to monitor her progress.   Expected Outcomes  Short - continue to use TM Long increase MET level and get off [name]  Short: Try room air on seated equipment.  Long: Continue to improve stamina.  Short: Continue to work on breathing better.  Long: Continue to improve stamina.  Short - continue to attend consistently Long : be able to exercise without supplemental [name]  Short: Graduate!!  Long: Continue to walk independently      Discharge Exercise Prescription (Final Exercise Prescription Changes): Exercise Prescription Changes - 05/07/19 0900      Response to Exercise   Blood Pressure (Admit)  150/70    Blood Pressure (Exercise)  140/62    Blood Pressure (Exit)  150/80    Heart Rate (Admit)  87 bpm    Heart Rate (Exercise)  114 bpm    Heart Rate (Exit)  97 bpm    [name] Saturation (Admit)  98 %  [name] Saturation (Exercise)  94 %    [name] Saturation (Exit)  94 %    Rating of Perceived Exertion (Exercise)  12    Perceived Dyspnea (Exercise)  2    Symptoms  none    Duration  Continue with 30 min of aerobic exercise without signs/symptoms of physical distress.    Intensity  THRR unchanged      Progression   Progression  Continue to progress workloads  to maintain intensity without signs/symptoms of physical distress.    Average METs  2.16      Resistance Training   Training Prescription  Yes    Weight  3 lb    Reps  10-15      Interval Training   Interval Training  No      [name]   [name]  Continuous    Liters  1      Treadmill   MPH  1.5    Grade  0    Minutes  15    METs  2.15      Recumbant Bike   Level  2    Minutes  15    METs  2.3      NuStep   Level  4    Minutes  15    METs  2.2      T5 Nustep   Level  4    Minutes  15    METs  1.8      Biostep-RELP   Level  1    Minutes  15    METs  2      Home Exercise Plan   Plans to continue exercise at  [employer] (comment)   [employer]   Frequency  Add 2 additional days to program exercise sessions.    Initial Home Exercises Provided  03/27/19       Nutrition:  Target Goals: Understanding of nutrition guidelines, daily intake of sodium <1561m, cholesterol <2078m calories 30% from fat and 7% or less from saturated fats, daily to have 5 or more servings of fruits and vegetables.  Biometrics:    Nutrition Therapy Plan and Nutrition Goals: Nutrition Therapy & Goals - 01/28/19 1340      Nutrition Therapy   Diet  Low Na, HH, DM diet    Protein (specify units)  60-65g    Fiber  25 grams    Whole Grain Foods  3 servings    Saturated Fats  12 max. grams    Fruits and Vegetables  5 servings/day    Sodium  1.5 grams      Personal Nutrition Goals   Nutrition Goal  ST: Adding snacks in between meals to reduce mindless eating LT: breath without O2 and lose 15 lbs (155-->140 lbs)    Comments  Pt on Lasix. Pt reports eating cheerios with 2% milk (wants to change to almond milk but doesn't like it, talked about soymilk) OR frozen pancakes (every 1-2 weeks) OR oatmeal and decaf coffee (they told her in the hospital not the have caffiene but is unsure if thats still true, told her to call her doctor and ask). Pt will drink diet soda. L chicken salad  sandwich on whole wheat (used to be tomato, now cant tolerate tomatoes) with some cheetos (puffed). Will snack on cheetos now in quarentine. D chicken (doesn't like other meat) or pinto bean and broccoli casserole. Will watch her Na and says her dr told her that her numbers are fine. Pt would  like to work on the amount of snacks she eats due to boredom ; discussed midful eating and halthy snacking. Discussed HH and DM eating. Discussed increased protein and calorie needs due to her pulmonary issues and maintaining muscle mass. Pt reports sister will shop for her and is trying to get her to buy [facility] and DM friendly foods, just got her to stop bringing sweets.      Intervention Plan   Intervention  Prescribe, educate and counsel regarding individualized specific dietary modifications aiming towards targeted core components such as weight, hypertension, lipid management, diabetes, heart failure and other comorbidities.;Nutrition handout(s) given to patient.    Expected Outcomes  Short Term Goal: Understand basic principles of dietary content, such as calories, fat, sodium, cholesterol and nutrients.;Short Term Goal: A plan has been developed with personal nutrition goals set during dietitian appointment.;Long Term Goal: Adherence to prescribed nutrition plan.       Nutrition Assessments: Nutrition Assessments - 05/20/19 0827      MEDFICTS Scores   Post Score  32       Nutrition Goals Re-Evaluation: Nutrition Goals Re-Evaluation    [place] Name 02/24/19 1548 03/24/19 1549 04/29/19 1154 05/05/19 1612       Goals   Nutrition Goal  ST:  continue healthy snacking and increase vegetable and fruit intake for now LT: breath without O2 and lose 15 lbs (155-->140 lbs)  ST:  continue healthy snacking and increase vegetable and fruit intake for now LT: breath without O2 and lose 15 lbs (155-->140 lbs)  ST:  continue healthy snacking and increase vegetable and fruit intake for now LT: breath without O2 and lose 15  lbs (155-->140 lbs)  ST:  continue healthy snacking and increase vegetable and fruit intake for now LT: breath without O2 and lose 15 lbs (155-->140 lbs)    Comment  Eating fruit and vegetables as a part of snack and meal. Vegetables like broccoli brussell sprouts, squash, celery, carrots, etc. Fruit apples, tangerines, grapes, bananas. 3 fruit/day, 4 vegetables/day. Pt eating more mindfully peanutbutter and celery. feeling full, doesn't feel as tired, and has regular bowel movements.  Eating fruit and vegetables as a part of snack and meal. Vegetables like broccoli brussell sprouts, squash, celery, carrots, etc. Fruit apples, tangerines, grapes, bananas. 3 fruit/day, 4 vegetables/day. Pt eating more mindfully peanutbutter and celery. feeling full, doesn't feel as tired, and has regular bowel movements. Continue to use healthy snacks to moderate hunger during the day and keep the variety. Pt reports not wanting to make a new goal or needing anything from this RD.  Continue with current changes  Pt reports she would do a lot better if her sister would stop buying "junk". Pt reports now eating oatmeal again with different kinds of berries. Whole wheat sugar free bread, pt like with toast.    Expected Outcome  continue progress with HH eating  ST:  continue healthy snacking and increase vegetable and fruit intake for now LT: breath without O2 and lose 15 lbs (155-->140 lbs)  ST:  continue healthy snacking and increase vegetable and fruit intake for now LT: breath without O2 and lose 15 lbs (155-->140 lbs)  ST:  continue healthy snacking and increase vegetable and fruit intake for now LT: breath without O2 and lose 15 lbs (155-->140 lbs)       Nutrition Goals Discharge (Final Nutrition Goals Re-Evaluation): Nutrition Goals Re-Evaluation - 05/05/19 1612      Goals   Nutrition Goal  ST:  continue healthy  snacking and increase vegetable and fruit intake for now LT: breath without O2 and lose 15 lbs (155-->140  lbs)    Comment  Pt reports she would do a lot better if her sister would stop buying "junk". Pt reports now eating oatmeal again with different kinds of berries. Whole wheat sugar free bread, pt like with toast.    Expected Outcome  ST:  continue healthy snacking and increase vegetable and fruit intake for now LT: breath without O2 and lose 15 lbs (155-->140 lbs)       Psychosocial: Target Goals: Acknowledge presence or absence of significant depression and/or stress, maximize coping skills, provide positive support system. Participant is able to verbalize types and ability to use techniques and skills needed for reducing stress and depression.   Initial Review & Psychosocial Screening: Initial Psych Review & Screening - 01/20/19 1052      Initial Review   Current issues with  Current Stress Concerns;Current Sleep Concerns    Source of Stress Concerns  Chronic Illness    Comments  Andrea [name] is self isolating away from family and friends. Her MD recently told her not to go back to church because of being high risk. She misses it, but stays in contact with her church friends and her family daily. She reports not sleeping well mainly because of the concentrator's noise level. She is also getting ready to move to a bigger apartment in September which she is really excited about!      Family Dynamics   Good Support System?  Yes      Barriers   Psychosocial barriers to participate in program  There are no identifiable barriers or psychosocial needs.      Screening Interventions   Interventions  Encouraged to exercise    Expected Outcomes  Long Term Goal: Stressors or current issues are controlled or eliminated.;Short Term goal: Utilizing psychosocial counselor, staff and physician to assist with identification of specific Stressors or current issues interfering with healing process. Setting desired goal for each stressor or current issue identified.;Short Term goal: Identification and review with  participant of any Quality of Life or Depression concerns found by scoring the questionnaire.;Long Term goal: The participant improves quality of Life and PHQ9 Scores as seen by post scores and/or verbalization of changes       Quality of Life Scores:  Quality of Life - 05/20/19 0827      Quality of Life Scores   Health/Function Post  21.8 %      Scores of 19 and below usually indicate a poorer quality of life in these areas.  A difference of  2-3 points is a clinically meaningful difference.  A difference of 2-3 points in the total score of the Quality of Life Index has been associated with significant improvement in overall quality of life, self-image, physical symptoms, and general health in studies assessing change in quality of life.  PHQ-9: Recent Review Flowsheet Data    There is no flowsheet data to display.     Interpretation of Total Score  Total Score Depression Severity:  1-4 = Minimal depression, 5-9 = Mild depression, 10-14 = Moderate depression, 15-19 = Moderately severe depression, 20-27 = Severe depression   Psychosocial Evaluation and Intervention:   Psychosocial Re-Evaluation: Psychosocial Re-Evaluation    [place] Name 01/07/19 1111 02/20/19 1546 03/26/19 1541         Psychosocial Re-Evaluation   Current issues with  Current Stress Concerns  Current Stress Concerns  None Identified  Comments  She is feeling good at home.  She is worried about her younger daughter not wearing a mask.  Her oldest is a Marine scientist and has tested positive for COVID already.  She is worried for her family, but coping the best she can.  Andrea [name] is stressed about moving. She is moving. She is working really hard to move all of her belongings to a bigger apartment. Her [name] and her walker were too crowded in her old apartment. Patient reports having chest pain when working hard and being stressed and that it went away with rest. Chest pain was a 2 out of 10. Patient was encouraged to ask for  help and is going to do so. Patient knows a friend that will help this weekend.  She has now moved and her stress has been lifted alot since she can get around better. She has everything moved in straightened out and has not been at ease.     Expected Outcomes  Short: Continue to talk to daughters on phone for connection and get started with rehab.  Long: Continue to stay positive.  Short: Patient will have everything moved by Monday by asking friend for help. Long: Patient will work on conserving energy by working in [employer], sitting when she can to do certain activities, and taking breaks during big house chores as needed. Patient understanding that she needs to find a new way of completing tasks then before procedure.  Short: attend [facility] regularly. Long: maintain a workout regimine to keep stress at a minumum.     Interventions  Encouraged to attend Cardiac Rehabilitation for the exercise  -  Encouraged to attend Cardiac Rehabilitation for the exercise     Continue Psychosocial Services   Follow up required by staff  -  Follow up required by staff        Psychosocial Discharge (Final Psychosocial Re-Evaluation): Psychosocial Re-Evaluation - 03/26/19 1541      Psychosocial Re-Evaluation   Current issues with  None Identified    Comments  She has now moved and her stress has been lifted alot since she can get around better. She has everything moved in straightened out and has not been at ease.    Expected Outcomes  Short: attend [facility] regularly. Long: maintain a workout regimine to keep stress at a minumum.    Interventions  Encouraged to attend Cardiac Rehabilitation for the exercise    Continue Psychosocial Services   Follow up required by staff       Vocational Rehabilitation: Provide vocational rehab assistance to qualifying candidates.   Vocational Rehab Evaluation & Intervention: Vocational Rehab - 01/20/19 1044      Initial Vocational Rehab Evaluation & Intervention    Assessment shows need for Vocational Rehabilitation  No       Education: Education Goals: Education classes will be provided on a variety of topics geared toward better understanding of heart health and risk factor modification. Participant will state understanding/return demonstration of topics presented as noted by education test scores.  Learning Barriers/Preferences: Learning Barriers/Preferences - 01/20/19 1052      Learning Barriers/Preferences   Learning Barriers  None    Learning Preferences  None       Education Topics:  AED/CPR: - Group verbal and written instruction with the use of models to demonstrate the basic use of the AED with the basic ABC's of resuscitation.   General Nutrition Guidelines/Fats and Fiber: -Group instruction provided by verbal, written material, models and posters to  present the general guidelines for heart healthy nutrition. Gives an explanation and review of dietary fats and fiber.   Controlling Sodium/Reading Food Labels: -Group verbal and written material supporting the discussion of sodium use in heart healthy nutrition. Review and explanation with models, verbal and written materials for utilization of the food label.   Exercise Physiology & General Exercise Guidelines: - Group verbal and written instruction with models to review the exercise physiology of the cardiovascular system and associated critical values. Provides general exercise guidelines with specific guidelines to those with heart or lung disease.    Aerobic Exercise & Resistance Training: - Gives group verbal and written instruction on the various components of exercise. Focuses on aerobic and resistive training programs and the benefits of this training and how to safely progress through these programs..   Cardiac Rehab from 05/01/2019 in [facility] Cardiac and Pulmonary Rehab  Date  05/01/19  Educator  [facility]  Instruction Review Code  1- Verbalizes Understanding       Flexibility, Balance, Mind/Body Relaxation: Provides group verbal/written instruction on the benefits of flexibility and balance training, including mind/body exercise modes such as yoga, pilates and tai chi.  Demonstration and skill practice provided.   Stress and Anxiety: - Provides group verbal and written instruction about the health risks of elevated stress and causes of high stress.  Discuss the correlation between heart/lung disease and anxiety and treatment options. Review healthy ways to manage with stress and anxiety.   Depression: - Provides group verbal and written instruction on the correlation between heart/lung disease and depressed mood, treatment options, and the stigmas associated with seeking treatment.   Anatomy & Physiology of the Heart: - Group verbal and written instruction and models provide basic cardiac anatomy and physiology, with the coronary electrical and arterial systems. Review of Valvular disease and Heart Failure   Cardiac Rehab from 05/01/2019 in [facility] Cardiac and Pulmonary Rehab  Date  05/01/19  Educator  [facility]  Instruction Review Code  1- Verbalizes Understanding      Cardiac Procedures: - Group verbal and written instruction to review commonly prescribed medications for heart disease. Reviews the medication, class of the drug, and side effects. Includes the steps to properly store meds and maintain the prescription regimen. (beta blockers and nitrates)   Cardiac Rehab from 05/01/2019 in [facility] Cardiac and Pulmonary Rehab  Date  05/01/19  Educator  [facility]  Instruction Review Code  1- Verbalizes Understanding      Cardiac Medications I: - Group verbal and written instruction to review commonly prescribed medications for heart disease. Reviews the medication, class of the drug, and side effects. Includes the steps to properly store meds and maintain the prescription regimen.   Cardiac Medications II: -Group verbal and written instruction to review  commonly prescribed medications for heart disease. Reviews the medication, class of the drug, and side effects. (all other drug classes)   Cardiac Rehab from 04/17/2019 in [facility] Cardiac and Pulmonary Rehab  Date  04/17/19  Educator  [facility]  Instruction Review Code  1- Verbalizes Understanding       Go Sex-Intimacy & Heart Disease, Get SMART - Goal Setting: - Group verbal and written instruction through game format to discuss heart disease and the return to sexual intimacy. Provides group verbal and written material to discuss and apply goal setting through the application of the S.M.A.R.T. Method.   Cardiac Rehab from 05/01/2019 in [facility] Cardiac and Pulmonary Rehab  Date  05/01/19  Educator  [facility]  Instruction Review  Code  1- Verbalizes Understanding      Other Matters of the Heart: - Provides group verbal, written materials and models to describe Stable Angina and Peripheral Artery. Includes description of the disease process and treatment options available to the cardiac patient.   Exercise & Equipment Safety: - Individual verbal instruction and demonstration of equipment use and safety with use of the equipment.   Cardiac Rehab from 04/17/2019 in [facility] Cardiac and Pulmonary Rehab  Date  01/28/19  Educator  [place]  Instruction Review Code  1- Verbalizes Understanding      Infection Prevention: - Provides verbal and written material to individual with discussion of infection control including proper hand washing and proper equipment cleaning during exercise session.   Cardiac Rehab from 04/17/2019 in [facility] Cardiac and Pulmonary Rehab  Date  01/28/19  Educator  [place]  Instruction Review Code  1- Verbalizes Understanding      Falls Prevention: - Provides verbal and written material to individual with discussion of falls prevention and safety.   Cardiac Rehab from 04/17/2019 in [facility] Cardiac and Pulmonary Rehab  Date  01/28/19  Educator  [place]  Instruction Review Code  1- Verbalizes Understanding       Diabetes: - Individual verbal and written instruction to review signs/symptoms of diabetes, desired ranges of glucose level fasting, after meals and with exercise. Acknowledge that pre and post exercise glucose checks will be done for 3 sessions at entry of program.   Cardiac Rehab from 04/17/2019 in [facility] Cardiac and Pulmonary Rehab  Date  01/28/19  Educator  [place]  Instruction Review Code  1- Verbalizes Understanding      Know Your Numbers and Risk Factors: -Group verbal and written instruction about important numbers in your health.  Discussion of what are risk factors and how they play a role in the disease process.  Review of Cholesterol, Blood Pressure, Diabetes, and BMI and the role they play in your overall health.   Cardiac Rehab from 04/17/2019 in [facility] Cardiac and Pulmonary Rehab  Date  04/17/19  Educator  [facility]  Instruction Review Code  1- Verbalizes Understanding      Sleep Hygiene: -Provides group verbal and written instruction about how sleep can affect your health.  Define sleep hygiene, discuss sleep cycles and impact of sleep habits. Review good sleep hygiene tips.    Other: -Provides group and verbal instruction on various topics (see comments)   Knowledge Questionnaire Score: Knowledge Questionnaire Score - 05/16/19 0837      Knowledge Questionnaire Score   Post Score  24/26       Core Components/Risk Factors/Patient Goals at Admission: Personal Goals and Risk Factors at Admission - 12/17/18 1009      Core Components/Risk Factors/Patient Goals on Admission    Weight Management  Yes;Weight Loss    Intervention  Weight Management: Develop a combined nutrition and exercise program designed to reach desired caloric intake, while maintaining appropriate intake of nutrient and fiber, sodium and fats, and appropriate energy expenditure required for the weight goal.;Weight Management: Provide education and appropriate resources to help participant work on and  attain dietary goals.    Expected Outcomes  Short Term: Continue to assess and modify interventions until short term weight is achieved;Long Term: Adherence to nutrition and physical activity/exercise program aimed toward attainment of established weight goal;Weight Loss: Understanding of general recommendations for a balanced deficit meal plan, which promotes 1-2 lb weight loss per week and includes a negative energy balance of [phone] kcal/d;Understanding recommendations  for meals to include 15-35% energy as protein, 25-35% energy from fat, 35-60% energy from carbohydrates, less than 260m of dietary cholesterol, 20-35 gm of total fiber daily;Understanding of distribution of calorie intake throughout the day with the consumption of 4-5 meals/snacks    Diabetes  Yes    Intervention  Provide education about signs/symptoms and action to take for hypo/hyperglycemia.;Provide education about proper nutrition, including hydration, and aerobic/resistive exercise prescription along with prescribed medications to achieve blood glucose in normal ranges: Fasting glucose 65-99 mg/dL    Expected Outcomes  Short Term: Participant verbalizes understanding of the signs/symptoms and immediate care of hyper/hypoglycemia, proper foot care and importance of medication, aerobic/resistive exercise and nutrition plan for blood glucose control.;Long Term: Attainment of HbA1C < 7%.    Hypertension  Yes    Intervention  Provide education on lifestyle modifcations including regular physical activity/exercise, weight management, moderate sodium restriction and increased consumption of fresh fruit, vegetables, and low fat dairy, alcohol moderation, and smoking cessation.;Monitor prescription use compliance.    Expected Outcomes  Short Term: Continued assessment and intervention until BP is < 140/967mHG in hypertensive participants. < 130/8011mG in hypertensive participants with diabetes, heart failure or chronic kidney disease.;Long  Term: Maintenance of blood pressure at goal levels.    Lipids  Yes    Intervention  Provide education and support for participant on nutrition & aerobic/resistive exercise along with prescribed medications to achieve LDL <36m64mDL >40mg64m Expected Outcomes  Long Term: Cholesterol controlled with medications as prescribed, with individualized exercise RX and with personalized nutrition plan. Value goals: LDL < 36mg,60m > 40 mg.;Short Term: Participant states understanding of desired cholesterol values and is compliant with medications prescribed. Participant is following exercise prescription and nutrition guidelines.       Core Components/Risk Factors/Patient Goals Review:  Goals and Risk Factor Review    Row Name 01/07/19 1112 01/20/19 1046 01/28/19 1612 02/20/19 1554 03/26/19 1544     Core Components/Risk Factors/Patient Goals Review   Personal Goals Review  Weight Management/Obesity;Hypertension  Weight Management/Obesity;Diabetes;Hypertension  Weight Management/Obesity;Diabetes;Hypertension  Weight Management/Obesity;Diabetes;Hypertension;Other  Weight Management/Obesity;Hypertension;Diabetes;Lipids   Review  Her weight and blood pressures have been good and she has not noted any problems.  Her weight is a little up, but slowly. She is keeping an eye on it, but still wants to lose weight. Her current weight is 153lb and she wants to get down to 140 lb. Her blood pressure has been running well. She feels like she is managing her diabetes well.  Her weight is a little up, but slowly. She is keeping an eye on it, but still wants to lose weight. Her current weight is 153lb and she wants to get down to 140 lb. Her blood pressure has been running well. She feels like she is managing her diabetes well.  Patient reports no weight loss but that her clothes feel less tight and she thinks that she is firming up. Patient is determined to lose weight. Patient does have a blood pressure cuff at home and  checks it once daily. BP has been elevated some when entering Cardiac Rehab but fine for exit BP. Patient checks blood glucose every morning and sometimes at night. Fasting blood sugar is in the normal range and night time blood sugar is never over 200 mg/dL. Patient takes all of her medications the way the doctor has prescribed them. Patient said the doctor is taking her off of blood thinners this month.Patient really wants to  become healthy enough to stop wearing [name].  Patient wants to be aroung 140 pounds. She weighed 159 pounds today. She is not sure what she is eating to cause her weight going up. She states she has been eating less. Her A1C is 7.7. She states it is better than when she had her heart surgury. [place] and Glipozide is what she is maintaining her blood sugars with. She checks her sugar at home regularly.   Expected Outcomes  Short and Long: Continue to monitor risk factors.  Short: come to Cardiac Rehab to learn more about risk factors. Long: become independent with managing risk factors.  Short: come to Cardiac Rehab to learn more about risk factors. Long: become independent with managing risk factors.  Short: Patient wants to continue becoming stronger, lose weight, and needing less [name]. Long: Patient wants to stop or decrease wearing [name] by increasing her lung function with exercise.  Short: lose 5 pounds in two weeks. Long: maintain weight loss and continue to lose weight.      Core Components/Risk Factors/Patient Goals at Discharge (Final Review):  Goals and Risk Factor Review - 03/26/19 1544      Core Components/Risk Factors/Patient Goals Review   Personal Goals Review  Weight Management/Obesity;Hypertension;Diabetes;Lipids    Review  Patient wants to be aroung 140 pounds. She weighed 159 pounds today. She is not sure what she is eating to cause her weight going up. She states she has been eating less. Her A1C is 7.7. She states it is better than when she had her heart  surgury. [place] and Glipozide is what she is maintaining her blood sugars with. She checks her sugar at home regularly.    Expected Outcomes  Short: lose 5 pounds in two weeks. Long: maintain weight loss and continue to lose weight.       ITP Comments: ITP Comments    Row Name 12/10/18 1057 12/17/18 1006 12/25/18 1004 01/07/19 1044 01/20/19 1035   ITP Comments  Initial Visit for Virtual [facility] today. Consent and intake completed. Appt made for EP and RD .  Completed initial ExRx created and sent to Dr. Emily Filbert, Medical Director to review and sign.  [place] contacted [place] via the [employer] App that she needs to hold off on exercise until cleared by her doctor.  Virtual appt completed.  She is feeling better and has been wearing an event monitor for 14 days. She turns it in on Thurs 01/09/19.  She has had a few episodes of "weakness" feeling and would like to wait to actually come in person until after her f/u visit on 01/27/19 with Dr. Sabra Heck.  RN Virtual orientation completed. Diagnosis can be found in CE 2/18. EP/RD scheduled for 8/4   Row Name 01/29/19 1658 02/05/19 0559 02/12/19 1650 03/05/19 0607 04/02/19 1257   ITP Comments  First full day of exercise!  Patient was oriented to gym and equipment including functions, settings, policies, and procedures.  Patient's individual exercise prescription and treatment plan were reviewed.  All starting workloads were established based on the results of the 6 minute walk test done at initial orientation visit.  The plan for exercise progression was also introduced and progression will be customized based on patient's performance and goals.  30 Day Review Completed today. Continue with ITP unless changed by Medical Director review.  Jaanvi has been moving. 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   Some SOB and need for rescue inhaler with REC  Bike use. Was able to complete exercise session without further problems.  30 Day  review. Continue with ITP unless directed changes per Medical Director review.  30 day review completed. ITP sent to Dr. Emily Filbert, Medical Director of Cardiac and Pulmonary Rehab. Continue with ITP unless changes are made by physician.  Department closed starting 10/2 until further notice by infection prevention and Health at Work teams for [place].   [place] Name 04/07/19 1724 04/30/19 0629 05/01/19 0731 05/20/19 1329 05/27/19 1324   ITP Comments  Faxed strips with PVCs to [place]. Reviewed symptoms with patient and safety.  30 day review completed. Continue with ITP sent to Dr. Emily Filbert, Medical Director of Cardiac and Pulmonary Rehab for review , changes as needed and signature.  PalpitationsHR elevated with perceived skipped beats in 11/2018. [place] 12/2018 with 31 episodes of SVT (longest 41 sec, fastest 144). Still has rare symptoms that last a few seconds and resolve on their own but not bothersome. No intervention at this time. Notes from MD visit 11/2 regarding heart rhythm  Andrea [name] has been out since 11/11/202 due to Covid 19.  Called to check on patient. She was discharged from hospital for COVID-19 on 11/24.  She is under a 14 day quarantine. She cannot return to rehab until 12/10.  She is still having symptoms.  She only has three visits left.  She will decide if she wants to return to finish.  She is planning to do pulmonary rehab now as well.   [place] Name 05/28/19 1000 06/04/19 1151         ITP Comments  30 day review competed . ITP sent to Dr Emily Filbert for review, changes as needed and ITP approval signature.  Andrea [name] has been out since 05/07/19.  She was admitted COVID+ and discharged.  She was now readmitted on 12/5 for HAP.  We will discharge her from Cardiac Rehab at this time.  She would like to come to Pulmonary Rehab once she recovers.        Comments: Discharge ITP

## 2019-06-04 NOTE — Progress Notes (Signed)
PROGRESS NOTE  Andrea [name] [id number] DOB: 1945/07/08   PCP: Chester Holstein, MD  Patient is from: Home  DOA: 05/31/2019 LOS: 4  Brief Narrative / Interim history: 73 year old female with history of COPD, chronic RF, DM-2, CKD-3, PE, CAD, HTN, and recent hospitalization for COVID-19 from 11/19-11/24 status post Decadron and remdesivir return to ED with subjective fever, S OB and productive cough.  In ED, HDS.  Saturation 90% on 3 L by [place].  CRP 14.8.  LA 1.0.  Procalcitonin less than 0.1.  D-dimer 2.5.  CXR with mild patchy opacities in the lingula and bilateral lower lobe suspicious for pneumonia. Patient was admitted for acute on chronic respiratory failure with hypoxia due to possible HAP and COPD exacerbation.  She was started on systemic steroid.  However, she had uncontrolled hyperglycemia up to 700.  Steroid stopped.  Briefly put on insulin drips and transferred to stepdown.    Subjective: No major events overnight of this morning.  Desaturated to 82% when she was up on bedside commode for bathing/cleaning.  Required 6 L to maintain saturation in the 90s.  Reports feeling short of breath.  Also reports productive cough with rusty phlegm.  She denies hemoptysis, chest pain, nausea, vomiting or abdominal pain.  Objective: Vitals:   06/04/19 0400 06/04/19 0700 06/04/19 0754 06/04/19 1025  BP: (!) 119/50  (!) 114/44 (!) 141/57  Pulse: 65  89 83  Resp: (!) 21  (!) 24   Temp: 97.6 F (36.4 C) 97.6 F (36.4 C)    TempSrc: Oral Oral    SpO2: 96%  90%   Weight:      Height:        Intake/Output Summary (Last 24 hours) at 06/04/2019 1135 Last data filed at 06/04/2019 1033 Gross per 24 hour  Intake 839.83 ml  Output 750 ml  Net 89.83 ml   Filed Weights   05/31/19 0847 06/01/19 0102  Weight: 68 kg 67.9 kg    Examination:  GENERAL: No acute distress.  Appears well.  HEENT: MMM.  Vision and hearing grossly intact.  NECK: Supple.  No apparent JVD.  RESP:  No IWOB.   Diminished aeration bilaterally.  Some rhonchi. CVS:  RRR. Heart sounds normal.  ABD/GI/GU: Bowel sounds present. Soft. Non tender.  MSK/EXT:  No apparent deformity or edema. Moves extremities. SKIN: no apparent skin lesion or wound NEURO: Awake, alert and oriented appropriately.  No gross deficit.  PSYCH: Calm. Normal affect.  Procedures:  None  Assessment & Plan: Acute on chronic respiratory failure with hypoxia due to COVID-19 infection versus hospital-acquired pneumonia Acute exacerbation of COPD. -Hospitalized and completed remdesivir and Decadron course from 11/19-11/24 for COVID-19 pneumonia -Returns with subjective fever, shortness of breath and productive cough.   -PCT and LA  negative.  Blood culture with coag negative staph in 1 out of 2 bottles likely contaminant.  MRSA PCR negative. -Ceftriaxone and azithromycin on 12/5.  Vancomycin 12/6-12/8.  Cefepime 12/6-12/9 -SOB, productive cough, hypoxemia with minimal exertion and mild temp to 99 which is probably due to inflammatory process from Covid.  CRP uptrending.  Recently started on Decadron which was discontinued due to hyperglycemia. -Reaffirmed that she is full code and she is open to Actemra if needed although she is at the tail end of her hospital course.. -Continue breathing treatments and vitamins.   Uncontrolled DM-2 with hyperglycemia: A1c 8.9% on 05/16/2019.  CBG elevated. CBG (last 3)  Recent Labs    06/04/19 0236 06/04/19 0745  06/04/19 1114  GLUCAP 70 83 278*  -On Levemir 6 units twice daily and NovoLog AC. -Continue SSI and Tradjenta. -Continue statin and CBG monitoring.  HTN/CAD/CABG in 07/2018: Stable. -Continue home aspirin, metoprolol, statin and Zetia.Marland Kitchen  History of PE in 08/2018: No longer on anticoagulation.  Reportedly finished 6 months of Eliquis and stopped. -Continue VTE prophylaxis  AKI on CKD-3B:  AKI resolved. -Continue monitoring  Normocytic anemia: Likely anemia of chronic/renal  disease.  H&H stable. -Hgb 9-10 (baseline)>> 8.8  Paroxysmal A. fib: Not on anticoagulation.  Followed by Dr. Koren Bound at [facility]. -Defer to outpatient cardiologist.  She would probably benefit from anticoagulation given history of PE and her elevated CHA2DS2-VASc score.                 DVT prophylaxis: Subcu heparin Code Status: Full code Family Communication: None at bedside.  Will update later. Disposition Plan: Remains inpatient due to hypoxemic respiratory failure Consultants: None   Microbiology summarized: Blood culture with coag negative staph in 1 out of 2 bottles MRSA PCR negative. COVID-19 positive on 05/15/2019.  Sch Meds:  Scheduled Meds: . aspirin EC  81 mg Oral Daily  . atorvastatin  40 mg Oral QHS  . Chlorhexidine Gluconate Cloth  6 each Topical Daily  . ezetimibe  10 mg Oral Daily  . fluticasone  1 spray Each Nare Daily  . glipiZIDE  10 mg Oral BID AC  . heparin injection (subcutaneous)  5,000 Units Subcutaneous Q8H  . insulin aspart  0-15 Units Subcutaneous TID WC  . insulin aspart  0-5 Units Subcutaneous QHS  . Ipratropium-Albuterol  1 puff Inhalation TID  . linagliptin  5 mg Oral Daily  . mouth rinse  15 mL Mouth Rinse BID  . metoprolol succinate  25 mg Oral Daily  . mometasone-formoterol  2 puff Inhalation BID  . multivitamin with minerals  1 tablet Oral Daily  . pantoprazole  40 mg Oral Daily  . phosphorus  250 mg Oral TID  . vitamin C  500 mg Oral Daily  . zinc sulfate  220 mg Oral Daily   Continuous Infusions: . sodium chloride Stopped (06/04/19 1108)   PRN Meds:.sodium chloride, chlorpheniramine-HYDROcodone, dextrose, guaiFENesin-dextromethorphan  Antimicrobials: Anti-infectives (From admission, onward)   Start     Dose/Rate Route Frequency Ordered Stop   06/03/19 1000  vancomycin (VANCOCIN) IVPB 1000 mg/200 mL premix  Status:  Discontinued     1,000 mg 200 mL/hr over 60 Minutes Intravenous Every 48 hours 06/01/19 0949  06/02/19 1020   06/03/19 0000  vancomycin (VANCOCIN) IVPB 1000 mg/200 mL premix  Status:  Discontinued     1,000 mg 200 mL/hr over 60 Minutes Intravenous Every 36 hours 06/02/19 1020 06/03/19 1149   06/01/19 1100  ceFEPIme (MAXIPIME) 2 g in sodium chloride 0.9 % 100 mL IVPB  Status:  Discontinued     2 g 200 mL/hr over 30 Minutes Intravenous Daily 06/01/19 0944 06/04/19 1052   06/01/19 1100  vancomycin (VANCOCIN) 1,500 mg in sodium chloride 0.9 % 500 mL IVPB     1,500 mg 250 mL/hr over 120 Minutes Intravenous  Once 06/01/19 0948 06/01/19 2130   05/31/19 1145  cefTRIAXone (ROCEPHIN) 1 g in sodium chloride 0.9 % 100 mL IVPB     1 g 200 mL/hr over 30 Minutes Intravenous  Once 05/31/19 1143 05/31/19 1340   05/31/19 1145  azithromycin (ZITHROMAX) 500 mg in sodium chloride 0.9 % 250 mL IVPB     500 mg  250 mL/hr over 60 Minutes Intravenous  Once 05/31/19 1143 05/31/19 1512       I have personally reviewed the following labs and images: CBC: Recent Labs  Lab 05/31/19 1024 06/01/19 0447 06/02/19 0242 06/03/19 0232 06/04/19 0235  WBC 11.8* 6.2 10.5 8.2 9.2  NEUTROABS 10.4* 5.6 9.8* 7.0 6.9  HGB 10.9* 10.1* 9.4* 8.8* 8.8*  HCT 34.3* 31.9* 29.1* 27.5* 28.5*  MCV 92.2 93.3 93.0 94.2 94.4  PLT 100* 98* 86* 79* 97*   BMP &GFR Recent Labs  Lab 06/01/19 0447  06/01/19 1101 06/01/19 1647 06/02/19 0242 06/03/19 0232 06/04/19 0235  NA  --    < > 131* 138 139 137 138  K  --    < > 4.1 3.6 3.7 3.9 3.8  CL  --    < > 96* 106 106 105 106  CO2  --    < > 20* 21* 21* 22 23  GLUCOSE  --    < > 714* 245* 224* 173* 72  BUN  --    < > 64* 62* 55* 52* 43*  CREATININE  --    < > 2.07* 1.69* 1.54* 1.49* 1.51*  CALCIUM  --    < > 8.4* 8.5* 8.7* 8.3* 8.0*  MG 2.8*  --   --   --  2.7* 2.5* 2.3  PHOS 4.1  --   --   --  3.7 2.2* 2.9   < > = values in this interval not displayed.   Estimated Creatinine Clearance: 30.4 mL/min (A) (by C-G formula based on SCr of 1.51 mg/dL (H)). Liver & Pancreas:  Recent Labs  Lab 05/31/19 1024 06/01/19 0830 06/02/19 0242 06/03/19 0232 06/04/19 0235  AST 29 25 17 27 15   ALT 25 24 20 29 22   ALKPHOS 62 58 52 55 59  BILITOT 0.9 1.1 0.5 0.8 0.7  PROT 7.1 6.7 6.0* 5.9* 5.6*  ALBUMIN 3.1* 2.9* 2.5* 2.5* 2.1*   No results for input(s): LIPASE, AMYLASE in the last 168 hours. No results for input(s): AMMONIA in the last 168 hours. Diabetic: No results for input(s): HGBA1C in the last 72 hours. Recent Labs  Lab 06/03/19 1713 06/03/19 2052 06/04/19 0236 06/04/19 0745 06/04/19 1114  GLUCAP 185* 111* 70 83 278*   Cardiac Enzymes: No results for input(s): CKTOTAL, CKMB, CKMBINDEX, TROPONINI in the last 168 hours. No results for input(s): PROBNP in the last 8760 hours. Coagulation Profile: No results for input(s): INR, PROTIME in the last 168 hours. Thyroid Function Tests: No results for input(s): TSH, T4TOTAL, FREET4, T3FREE, THYROIDAB in the last 72 hours. Lipid Profile: No results for input(s): CHOL, HDL, LDLCALC, TRIG, CHOLHDL, LDLDIRECT in the last 72 hours. Anemia Panel: Recent Labs    06/03/19 0232 06/04/19 0235  FERRITIN 203 183   Urine analysis:    Component Value Date/Time   COLORURINE YELLOW 05/16/2019 0158   APPEARANCEUR CLEAR 05/16/2019 0158   LABSPEC 1.010 05/16/2019 0158   PHURINE 6.0 05/16/2019 0158   GLUCOSEU NEGATIVE 05/16/2019 0158   HGBUR NEGATIVE 05/16/2019 0158   BILIRUBINUR NEGATIVE 05/16/2019 0158   KETONESUR NEGATIVE 05/16/2019 0158   PROTEINUR 30 (A) 05/16/2019 0158   NITRITE NEGATIVE 05/16/2019 0158   LEUKOCYTESUR TRACE (A) 05/16/2019 0158   Sepsis Labs: Invalid input(s): PROCALCITONIN, [place]  Microbiology: Recent Results (from the past 240 hour(s))  Blood culture (routine x 2)     Status: Abnormal (Preliminary result)   Collection Time: 05/31/19 11:41 AM   Specimen: Right  Antecubital; Blood  Result Value Ref Range Status   Specimen Description   Final    RIGHT ANTECUBITAL Performed at  [facility] [address], [place], [place] [address]    Special Requests   Final    BOTTLES DRAWN AEROBIC AND ANAEROBIC Blood Culture adequate volume Performed at [facility] [address] [place], [place] [address]    Culture  Setup Time   Final    GRAM POSITIVE COCCI IN CLUSTERS ANAEROBIC BOTTLE ONLY CRITICAL RESULT CALLED TO, READ BACK BY AND VERIFIED WITH: Shelda Jakes PHARMD, AT 1738 06/01/19 BY D. Rico Sheehan POSITIVE RODS AEROBIC BOTTLE ONLY CRITICAL RESULT CALLED TO, READ BACK BY AND VERIFIED WITH: J LEGGE PHARMD 2006 06/02/19 A BROWNING    Culture (A)  Final    STAPHYLOCOCCUS SPECIES (COAGULASE NEGATIVE) THE SIGNIFICANCE OF ISOLATING THIS ORGANISM FROM A SINGLE SET OF BLOOD CULTURES WHEN MULTIPLE SETS ARE DRAWN IS UNCERTAIN. PLEASE NOTIFY THE MICROBIOLOGY DEPARTMENT WITHIN ONE WEEK IF SPECIATION AND SENSITIVITIES ARE REQUIRED. CULTURE REINCUBATED FOR BETTER GROWTH Performed at [facility], [place] [address] [place], [place] [address]    Report Status PENDING  Incomplete  Blood culture (routine x 2)     Status: None (Preliminary result)   Collection Time: 05/31/19 11:46 AM   Specimen: Left Antecubital; Blood  Result Value Ref Range Status   Specimen Description   Final    LEFT ANTECUBITAL Performed at [facility] [address] [place], [place] [address]    Special Requests   Final    BOTTLES DRAWN AEROBIC AND ANAEROBIC Blood Culture adequate volume Performed at [facility] [address], [place], [place] [address]    Culture   Final    NO GROWTH 4 DAYS Performed at [facility], [place] [address], [place], [place] [address]    Report Status PENDING  Incomplete  MRSA PCR Screening     Status: None   Collection Time: 06/01/19 10:26 AM   Specimen: Nasal Mucosa; Nasopharyngeal  Result Value Ref Range Status   MRSA by PCR NEGATIVE NEGATIVE Final    Comment:        The  GeneXpert MRSA Assay (FDA approved for NASAL specimens only), is one component of a comprehensive MRSA colonization surveillance program. It is not intended to diagnose MRSA infection nor to guide or monitor treatment for MRSA infections. Performed at [facility], [place] [address] [place], [place] [address]     Radiology Studies: No results found.   45 minutes with more than 50% spent in reviewing records, counseling patient/family and coordinating care.   Taye T. [place]  If 7PM-7AM, please contact night-coverage [email or web address] Password [email or web address] 06/04/2019, 11:35 AM

## 2019-06-05 DIAGNOSIS — Z86711 Personal history of pulmonary embolism: Secondary | ICD-10-CM

## 2019-06-05 LAB — C-REACTIVE PROTEIN: CRP: 10.4 mg/dL — ABNORMAL HIGH (ref <1.0)

## 2019-06-05 LAB — COMPREHENSIVE METABOLIC PANEL
ALT: 19 U/L (ref 0–44)
AST: 14 U/L — ABNORMAL LOW (ref 15–41)
Albumin: 2 g/dL — ABNORMAL LOW (ref 3.5–5.0)
Alkaline Phosphatase: 57 U/L (ref 38–126)
Anion gap: 9 (ref 5–15)
BUN: 33 mg/dL — ABNORMAL HIGH (ref 8–23)
CO2: 23 mmol/L (ref 22–32)
Calcium: 7.8 mg/dL — ABNORMAL LOW (ref 8.9–10.3)
Chloride: 104 mmol/L (ref 98–111)
Creatinine, Ser: 1.57 mg/dL — ABNORMAL HIGH (ref 0.44–1.00)
GFR calc Af Amer: 38 mL/min — ABNORMAL LOW (ref >60)
GFR calc non Af Amer: 33 mL/min — ABNORMAL LOW (ref >60)
Glucose, Bld: 69 mg/dL — ABNORMAL LOW (ref 70–99)
Potassium: 3.9 mmol/L (ref 3.5–5.1)
Sodium: 136 mmol/L (ref 135–145)
Total Bilirubin: 0.8 mg/dL (ref 0.3–1.2)
Total Protein: 5.4 g/dL — ABNORMAL LOW (ref 6.5–8.1)

## 2019-06-05 LAB — CBC WITH DIFFERENTIAL/PLATELET
Abs Immature Granulocytes: 0.06 10*3/uL (ref 0.00–0.07)
Basophils Absolute: 0 10*3/uL (ref 0.0–0.1)
Basophils Relative: 0 %
Eosinophils Absolute: 0.1 10*3/uL (ref 0.0–0.5)
Eosinophils Relative: 2 %
HCT: 26 % — ABNORMAL LOW (ref 36.0–46.0)
Hemoglobin: 8.2 g/dL — ABNORMAL LOW (ref 12.0–15.0)
Immature Granulocytes: 1 %
Lymphocytes Relative: 15 %
Lymphs Abs: 1.2 10*3/uL (ref 0.7–4.0)
MCH: 29.7 pg (ref 26.0–34.0)
MCHC: 31.5 g/dL (ref 30.0–36.0)
MCV: 94.2 fL (ref 80.0–100.0)
Monocytes Absolute: 0.4 10*3/uL (ref 0.1–1.0)
Monocytes Relative: 5 %
Neutro Abs: 6.1 10*3/uL (ref 1.7–7.7)
Neutrophils Relative %: 77 %
Platelets: 97 10*3/uL — ABNORMAL LOW (ref 150–400)
RBC: 2.76 MIL/uL — ABNORMAL LOW (ref 3.87–5.11)
RDW: 13.8 % (ref 11.5–15.5)
WBC: 7.9 10*3/uL (ref 4.0–10.5)
nRBC: 0 % (ref 0.0–0.2)

## 2019-06-05 LAB — GLUCOSE, CAPILLARY
Glucose-Capillary: 70 mg/dL (ref 70–99)
Glucose-Capillary: 211 mg/dL — ABNORMAL HIGH (ref 70–99)
Glucose-Capillary: 126 mg/dL — ABNORMAL HIGH (ref 70–99)
Glucose-Capillary: 119 mg/dL — ABNORMAL HIGH (ref 70–99)

## 2019-06-05 LAB — CULTURE, BLOOD (ROUTINE X 2)
Culture: NO GROWTH
Special Requests: ADEQUATE

## 2019-06-05 LAB — FERRITIN: Ferritin: 193 ng/mL (ref 11–307)

## 2019-06-05 LAB — D-DIMER, QUANTITATIVE: D-Dimer, Quant: 5.33 ug/mL-FEU — ABNORMAL HIGH (ref 0.00–0.50)

## 2019-06-05 LAB — MAGNESIUM: Magnesium: 2.1 mg/dL (ref 1.7–2.4)

## 2019-06-05 LAB — PHOSPHORUS: Phosphorus: 2.9 mg/dL (ref 2.5–4.6)

## 2019-06-05 MED ORDER — APIXABAN 5 MG PO TABS
5.0000 mg | ORAL_TABLET | Freq: Two times a day (BID) | ORAL | Status: DC
  Administered 2019-06-05 – 2019-06-10 (×11): 5 mg via ORAL
  Filled 2019-06-05 (×11): qty 1

## 2019-06-05 NOTE — Discharge Instructions (Signed)

## 2019-06-05 NOTE — Progress Notes (Signed)
PROGRESS NOTE  Andrea [name] [id number] DOB: 06-04-1946   PCP: Chester Holstein, MD  Patient is from: Home  DOA: 05/31/2019 LOS: 5  Brief Narrative / Interim history: 73 year old female with history of COPD, chronic RF, DM-2, CKD-3, PE, CAD, HTN, and recent hospitalization for COVID-19 from 11/19-11/24 status post Decadron and remdesivir return to ED with subjective fever, S OB and productive cough.  In ED, HDS.  Saturation 90% on 3 L by [place].  CRP 14.8.  LA 1.0.  Procalcitonin less than 0.1.  D-dimer 2.5.  CXR with mild patchy opacities in the lingula and bilateral lower lobe suspicious for pneumonia. Patient was admitted for acute on chronic respiratory failure with hypoxia due to possible HAP and COPD exacerbation.  She was started on systemic steroid.  However, she had uncontrolled hyperglycemia up to 700.  Steroid stopped.  Briefly put on insulin drips and transferred to stepdown.    Subjective: No major events overnight of this morning.  States feeling better.  Breathing and cough improved.  No chest pain, GI or UTI symptoms.  Saturating in mid 90s on 4 L by nasal cannula.  Objective: Vitals:   06/05/19 0000 06/05/19 0400 06/05/19 0800 06/05/19 1041  BP: (!) 130/45  (!) 125/51   Pulse: 84 73 92 (!) 103  Resp: 20 (!) 22 19 (!) 25  Temp:  99 F (37.2 C) 98.2 F (36.8 C)   TempSrc:   Oral   SpO2: 96% 95% 91% 92%  Weight:      Height:        Intake/Output Summary (Last 24 hours) at 06/05/2019 1104 Last data filed at 06/05/2019 0800 Gross per 24 hour  Intake 555.87 ml  Output -  Net 555.87 ml   Filed Weights   05/31/19 0847 06/01/19 0102  Weight: 68 kg 67.9 kg    Examination:  GENERAL: No acute distress.  Appears well.  HEENT: MMM.  Vision and hearing grossly intact.  NECK: Supple.  No apparent JVD.  RESP:  No IWOB.  Fair aeration with rhonchi bilaterally. CVS:  RRR. Heart sounds normal.  ABD/GI/GU: Bowel sounds present. Soft. Non tender.  MSK/EXT:  Moves  extremities. No apparent deformity or edema.  No calf tenderness. SKIN: no apparent skin lesion or wound NEURO: Awake, alert and oriented appropriately.  No apparent focal neuro deficit. PSYCH: Calm. Normal affect.  Procedures:  None  Assessment & Plan: Acute on chronic respiratory failure with hypoxia due to COVID-19 infection versus HAP Acute exacerbation of COPD. -Improving but still requiring 4 L -Hospitalized and completed remdesivir and Decadron course from 11/19-11/24 -Returns with subjective fever, shortness of breath and productive cough.   -PCT and LA  negative.  BCx with coag negative staph in 1/2 bottles likely contaminant.  -Ceftriaxone and azithromycin on 12/5.  Vancomycin 12/6-12/8.  Cefepime 12/6-12/9 -Briefly started on Decadron which was discontinued due to hyperglycemia -Respiratory symptoms improving.  Mild temp to 99 slightly post Covid -Continue breathing treatments and vitamins. Recent Labs    06/03/19 0232 06/04/19 0235 06/05/19 0204  DDIMER 2.03* 3.69* 5.33*  FERRITIN 203 183 193  CRP 5.6* 11.4* 10.4*    Uncontrolled DM-2 with hyperglycemia: A1c 8.9% on 05/16/2019.  CBG elevated. CBG (last 3)  Recent Labs    06/04/19 1700 06/04/19 2008 06/05/19 0746  GLUCAP 110* 175* 70  -On Levemir 6 units twice daily and NovoLog AC. -Continue SSI and Tradjenta. -Continue statin and CBG monitoring.  HTN/CAD/CABG in 07/2018: Stable. -Continue home aspirin,  metoprolol, statin and Zetia..  AKI on CKD-3B:  AKI resolved. -Continue monitoring  Normocytic anemia: Likely anemia of chronic/renal disease.  H&H stable. -Hgb 9-10 (baseline)>> 8.8  Paroxysmal A. fib: Not on anticoagulation.  Followed by Dr. Koren Bound at [facility]. She has a high risk for blood clot with history of PE and Covid.  She also have elevated CHA2DS-2-VASc score.  Tolerated Eliquis well in the past.  -Start Eliquis-discussed risk and benefit.  History of PE in 08/2018: No longer on  anticoagulation.  Reportedly stopped Eliquis after 6 months. -Eliquis as above.               DVT prophylaxis: Eliquis Code Status: Full code Family Communication: Updated patient's daughter over the phone 12/9.  Available if any question Disposition Plan: Remains inpatient due to hypoxemic respiratory failure Consultants: None   Microbiology summarized: Blood culture with coag negative staph in 1 out of 2 bottles MRSA PCR negative. COVID-19 positive on 05/15/2019.  Sch Meds:  Scheduled Meds: . aspirin EC  81 mg Oral Daily  . atorvastatin  40 mg Oral QHS  . Chlorhexidine Gluconate Cloth  6 each Topical Daily  . ezetimibe  10 mg Oral Daily  . fluticasone  1 spray Each Nare Daily  . heparin injection (subcutaneous)  5,000 Units Subcutaneous Q8H  . insulin aspart  0-15 Units Subcutaneous TID WC  . insulin aspart  0-5 Units Subcutaneous QHS  . insulin aspart  3 Units Subcutaneous TID WC  . insulin detemir  6 Units Subcutaneous QHS  . Ipratropium-Albuterol  1 puff Inhalation TID  . linagliptin  5 mg Oral Daily  . mouth rinse  15 mL Mouth Rinse BID  . metoprolol succinate  25 mg Oral Daily  . mometasone-formoterol  2 puff Inhalation BID  . multivitamin with minerals  1 tablet Oral Daily  . pantoprazole  40 mg Oral Daily  . vitamin C  500 mg Oral Daily  . zinc sulfate  220 mg Oral Daily   Continuous Infusions: . sodium chloride Stopped (06/04/19 1108)   PRN Meds:.sodium chloride, chlorpheniramine-HYDROcodone, dextrose, guaiFENesin-dextromethorphan  Antimicrobials: Anti-infectives (From admission, onward)   Start     Dose/Rate Route Frequency Ordered Stop   06/03/19 1000  vancomycin (VANCOCIN) IVPB 1000 mg/200 mL premix  Status:  Discontinued     1,000 mg 200 mL/hr over 60 Minutes Intravenous Every 48 hours 06/01/19 0949 06/02/19 1020   06/03/19 0000  vancomycin (VANCOCIN) IVPB 1000 mg/200 mL premix  Status:  Discontinued     1,000 mg 200 mL/hr over 60 Minutes  Intravenous Every 36 hours 06/02/19 1020 06/03/19 1149   06/01/19 1100  ceFEPIme (MAXIPIME) 2 g in sodium chloride 0.9 % 100 mL IVPB  Status:  Discontinued     2 g 200 mL/hr over 30 Minutes Intravenous Daily 06/01/19 0944 06/04/19 1052   06/01/19 1100  vancomycin (VANCOCIN) 1,500 mg in sodium chloride 0.9 % 500 mL IVPB     1,500 mg 250 mL/hr over 120 Minutes Intravenous  Once 06/01/19 0948 06/01/19 2130   05/31/19 1145  cefTRIAXone (ROCEPHIN) 1 g in sodium chloride 0.9 % 100 mL IVPB     1 g 200 mL/hr over 30 Minutes Intravenous  Once 05/31/19 1143 05/31/19 1340   05/31/19 1145  azithromycin (ZITHROMAX) 500 mg in sodium chloride 0.9 % 250 mL IVPB     500 mg 250 mL/hr over 60 Minutes Intravenous  Once 05/31/19 1143 05/31/19 1512  I have personally reviewed the following labs and images: CBC: Recent Labs  Lab 06/01/19 0447 06/02/19 0242 06/03/19 0232 06/04/19 0235 06/05/19 0204  WBC 6.2 10.5 8.2 9.2 7.9  NEUTROABS 5.6 9.8* 7.0 6.9 6.1  HGB 10.1* 9.4* 8.8* 8.8* 8.2*  HCT 31.9* 29.1* 27.5* 28.5* 26.0*  MCV 93.3 93.0 94.2 94.4 94.2  PLT 98* 86* 79* 97* 97*   BMP &GFR Recent Labs  Lab 06/01/19 0447 06/01/19 1647 06/02/19 0242 06/03/19 0232 06/04/19 0235 06/05/19 0204  NA  --  138 139 137 138 136  K  --  3.6 3.7 3.9 3.8 3.9  CL  --  106 106 105 106 104  CO2  --  21* 21* 22 23 23   GLUCOSE  --  245* 224* 173* 72 69*  BUN  --  62* 55* 52* 43* 33*  CREATININE  --  1.69* 1.54* 1.49* 1.51* 1.57*  CALCIUM  --  8.5* 8.7* 8.3* 8.0* 7.8*  MG 2.8*  --  2.7* 2.5* 2.3 2.1  PHOS 4.1  --  3.7 2.2* 2.9 2.9   Estimated Creatinine Clearance: 29.2 mL/min (A) (by C-G formula based on SCr of 1.57 mg/dL (H)). Liver & Pancreas: Recent Labs  Lab 06/01/19 0830 06/02/19 0242 06/03/19 0232 06/04/19 0235 06/05/19 0204  AST 25 17 27 15  14*  ALT 24 20 29 22 19   ALKPHOS 58 52 55 59 57  BILITOT 1.1 0.5 0.8 0.7 0.8  PROT 6.7 6.0* 5.9* 5.6* 5.4*  ALBUMIN 2.9* 2.5* 2.5* 2.1* 2.0*   No  results for input(s): LIPASE, AMYLASE in the last 168 hours. No results for input(s): AMMONIA in the last 168 hours. Diabetic: No results for input(s): HGBA1C in the last 72 hours. Recent Labs  Lab 06/04/19 0745 06/04/19 1114 06/04/19 1700 06/04/19 2008 06/05/19 0746  GLUCAP 83 278* 110* 175* 70   Cardiac Enzymes: No results for input(s): CKTOTAL, CKMB, CKMBINDEX, TROPONINI in the last 168 hours. No results for input(s): PROBNP in the last 8760 hours. Coagulation Profile: No results for input(s): INR, PROTIME in the last 168 hours. Thyroid Function Tests: No results for input(s): TSH, T4TOTAL, FREET4, T3FREE, THYROIDAB in the last 72 hours. Lipid Profile: No results for input(s): CHOL, HDL, LDLCALC, TRIG, CHOLHDL, LDLDIRECT in the last 72 hours. Anemia Panel: Recent Labs    06/04/19 0235 06/05/19 0204  FERRITIN 183 193   Urine analysis:    Component Value Date/Time   COLORURINE YELLOW 05/16/2019 0158   APPEARANCEUR CLEAR 05/16/2019 0158   LABSPEC 1.010 05/16/2019 0158   PHURINE 6.0 05/16/2019 0158   GLUCOSEU NEGATIVE 05/16/2019 0158   HGBUR NEGATIVE 05/16/2019 0158   BILIRUBINUR NEGATIVE 05/16/2019 0158   KETONESUR NEGATIVE 05/16/2019 0158   PROTEINUR 30 (A) 05/16/2019 0158   NITRITE NEGATIVE 05/16/2019 0158   LEUKOCYTESUR TRACE (A) 05/16/2019 0158   Sepsis Labs: Invalid input(s): PROCALCITONIN, [place]  Microbiology: Recent Results (from the past 240 hour(s))  Blood culture (routine x 2)     Status: Abnormal (Preliminary result)   Collection Time: 05/31/19 11:41 AM   Specimen: Right Antecubital; Blood  Result Value Ref Range Status   Specimen Description   Final    RIGHT ANTECUBITAL Performed at [facility], [place] [address], [place], [place] [address]    Special Requests   Final    BOTTLES DRAWN AEROBIC AND ANAEROBIC Blood Culture adequate volume Performed at [facility] [address] [place], [place] [address]     Culture  Setup Time   Final    GRAM POSITIVE COCCI IN CLUSTERS ANAEROBIC BOTTLE ONLY CRITICAL RESULT CALLED TO, READ BACK BY AND VERIFIED WITH: Shelda Jakes PHARMD, AT 1738 06/01/19 BY D. Victoriano Lain GRAM POSITIVE RODS AEROBIC BOTTLE ONLY CRITICAL RESULT CALLED TO, READ BACK BY AND VERIFIED WITH: J LEGGE PHARMD 2006 06/02/19 A BROWNING    Culture (A)  Final    STAPHYLOCOCCUS SPECIES (COAGULASE NEGATIVE) THE SIGNIFICANCE OF ISOLATING THIS ORGANISM FROM A SINGLE SET OF BLOOD CULTURES WHEN MULTIPLE SETS ARE DRAWN IS UNCERTAIN. PLEASE NOTIFY THE MICROBIOLOGY DEPARTMENT WITHIN ONE WEEK IF SPECIATION AND SENSITIVITIES ARE REQUIRED. CULTURE REINCUBATED FOR BETTER GROWTH Performed at [facility], [place] [address], [place], [place] [address]    Report Status PENDING  Incomplete  Blood culture (routine x 2)     Status: None   Collection Time: 05/31/19 11:46 AM   Specimen: Left Antecubital; Blood  Result Value Ref Range Status   Specimen Description   Final    LEFT ANTECUBITAL Performed at [facility] [address] [place], [place] [address]    Special Requests   Final    BOTTLES DRAWN AEROBIC AND ANAEROBIC Blood Culture adequate volume Performed at [facility] [address] [place], [place] [address]    Culture   Final    NO GROWTH 5 DAYS Performed at [facility], [place] [address], [place], [place] [address]    Report Status 06/05/2019 FINAL  Final  MRSA PCR Screening     Status: None   Collection Time: 06/01/19 10:26 AM   Specimen: Nasal Mucosa; Nasopharyngeal  Result Value Ref Range Status   MRSA by PCR NEGATIVE NEGATIVE Final    Comment:        The GeneXpert MRSA Assay (FDA approved for NASAL specimens only), is one component of a comprehensive MRSA colonization surveillance program. It is not intended to diagnose MRSA infection nor to guide or monitor treatment for MRSA infections. Performed at [facility], [place] [address] [place], [place] [address]     Radiology Studies: No results found.   Keniesha Adderly T. [place]  If 7PM-7AM, please contact night-coverage [email or web address] Password [email or web address] 06/05/2019, 11:04 AM

## 2019-06-06 LAB — CBC WITH DIFFERENTIAL/PLATELET
Abs Immature Granulocytes: 0.06 10*3/uL (ref 0.00–0.07)
Basophils Absolute: 0 10*3/uL (ref 0.0–0.1)
Basophils Relative: 0 %
Eosinophils Absolute: 0.2 10*3/uL (ref 0.0–0.5)
Eosinophils Relative: 2 %
HCT: 28.1 % — ABNORMAL LOW (ref 36.0–46.0)
Hemoglobin: 8.9 g/dL — ABNORMAL LOW (ref 12.0–15.0)
Immature Granulocytes: 1 %
Lymphocytes Relative: 15 %
Lymphs Abs: 1.2 10*3/uL (ref 0.7–4.0)
MCH: 30.1 pg (ref 26.0–34.0)
MCHC: 31.7 g/dL (ref 30.0–36.0)
MCV: 94.9 fL (ref 80.0–100.0)
Monocytes Absolute: 0.5 10*3/uL (ref 0.1–1.0)
Monocytes Relative: 6 %
Neutro Abs: 6 10*3/uL (ref 1.7–7.7)
Neutrophils Relative %: 76 %
Platelets: 134 10*3/uL — ABNORMAL LOW (ref 150–400)
RBC: 2.96 MIL/uL — ABNORMAL LOW (ref 3.87–5.11)
RDW: 14 % (ref 11.5–15.5)
WBC: 7.9 10*3/uL (ref 4.0–10.5)
nRBC: 0 % (ref 0.0–0.2)

## 2019-06-06 LAB — COMPREHENSIVE METABOLIC PANEL
ALT: 22 U/L (ref 0–44)
AST: 17 U/L (ref 15–41)
Albumin: 2.4 g/dL — ABNORMAL LOW (ref 3.5–5.0)
Alkaline Phosphatase: 63 U/L (ref 38–126)
Anion gap: 9 (ref 5–15)
BUN: 34 mg/dL — ABNORMAL HIGH (ref 8–23)
CO2: 24 mmol/L (ref 22–32)
Calcium: 8.1 mg/dL — ABNORMAL LOW (ref 8.9–10.3)
Chloride: 101 mmol/L (ref 98–111)
Creatinine, Ser: 1.85 mg/dL — ABNORMAL HIGH (ref 0.44–1.00)
GFR calc Af Amer: 31 mL/min — ABNORMAL LOW (ref >60)
GFR calc non Af Amer: 27 mL/min — ABNORMAL LOW (ref >60)
Glucose, Bld: 158 mg/dL — ABNORMAL HIGH (ref 70–99)
Potassium: 4.6 mmol/L (ref 3.5–5.1)
Sodium: 134 mmol/L — ABNORMAL LOW (ref 135–145)
Total Bilirubin: 0.8 mg/dL (ref 0.3–1.2)
Total Protein: 5.8 g/dL — ABNORMAL LOW (ref 6.5–8.1)

## 2019-06-06 LAB — C-REACTIVE PROTEIN: CRP: 10.8 mg/dL — ABNORMAL HIGH (ref <1.0)

## 2019-06-06 LAB — CULTURE, BLOOD (ROUTINE X 2): Special Requests: ADEQUATE

## 2019-06-06 LAB — GLUCOSE, CAPILLARY
Glucose-Capillary: 167 mg/dL — ABNORMAL HIGH (ref 70–99)
Glucose-Capillary: 172 mg/dL — ABNORMAL HIGH (ref 70–99)
Glucose-Capillary: 240 mg/dL — ABNORMAL HIGH (ref 70–99)
Glucose-Capillary: 435 mg/dL — ABNORMAL HIGH (ref 70–99)

## 2019-06-06 LAB — D-DIMER, QUANTITATIVE: D-Dimer, Quant: 5.19 ug/mL-FEU — ABNORMAL HIGH (ref 0.00–0.50)

## 2019-06-06 MED ORDER — DEXAMETHASONE SODIUM PHOSPHATE 10 MG/ML IJ SOLN
6.0000 mg | Freq: Every day | INTRAMUSCULAR | Status: DC
  Administered 2019-06-06 – 2019-06-10 (×5): 6 mg via INTRAVENOUS
  Filled 2019-06-06 (×5): qty 1

## 2019-06-06 MED ORDER — INSULIN DETEMIR 100 UNIT/ML ~~LOC~~ SOLN
10.0000 [IU] | Freq: Every day | SUBCUTANEOUS | Status: DC
  Administered 2019-06-06: 10 [IU] via SUBCUTANEOUS
  Filled 2019-06-06: qty 0.1

## 2019-06-06 MED ORDER — INSULIN ASPART 100 UNIT/ML ~~LOC~~ SOLN
15.0000 [IU] | Freq: Once | SUBCUTANEOUS | Status: AC
  Administered 2019-06-06: 21:00:00 15 [IU] via SUBCUTANEOUS

## 2019-06-06 NOTE — Progress Notes (Signed)
Patient's blood glucose was 435, she was restarted on Decadron today, MD Lennox Grumbles was notified.

## 2019-06-06 NOTE — Progress Notes (Signed)
PROGRESS NOTE  Andrea [name] [id number] DOB: [birth year]   PCP: Chester Holstein, MD  Patient is from: Home  DOA: 05/31/2019 LOS: 6  Brief Narrative / Interim history: 73 year old female with history of COPD, chronic RF, DM-2, CKD-3, PE, CAD, HTN, and recent hospitalization for COVID-19 from 11/19-11/24 status post Decadron and remdesivir return to ED with subjective fever, SOB and productive cough.  In ED, HDS.  Saturation 90% on 3 L by [place].  CRP 14.8.  LA 1.0.  Procalcitonin less than 0.1.  D-dimer 2.5.  CXR with mild patchy opacities in the lingula and bilateral lower lobe suspicious for pneumonia. Patient was admitted for acute on chronic respiratory failure with hypoxia due to possible HAP and COPD exacerbation.  She was started on systemic steroid.  However, she had uncontrolled hyperglycemia up to 700.  Steroid stopped.  Briefly put on insulin drips and transferred to stepdown.    Subjective: No major events overnight of this morning.  She says she felt short of breath and her oxygen dropped when she tried to get up last night.  She is currently on 4 L by nasal cannula.  Still with some productive cough.  Does not feel short of breath sitting in bed.  Denies chest pain.  Denies GI or UTI symptoms. Objective: Vitals:   06/05/19 1809 06/05/19 2131 06/06/19 0533 06/06/19 0922  BP: 138/86 (!) 122/54 (!) 131/53   Pulse: 87 93 92   Resp: 19 18 18    Temp: 98.7 F (37.1 C) 99.9 F (37.7 C) 98.7 F (37.1 C)   TempSrc: Oral Oral Oral   SpO2: 93% 96%  93%  Weight:      Height:        Intake/Output Summary (Last 24 hours) at 06/06/2019 1151 Last data filed at 06/06/2019 0600 Gross per 24 hour  Intake 600 ml  Output 0 ml  Net 600 ml   Filed Weights   05/31/19 0847 06/01/19 0102  Weight: 68 kg 67.9 kg    Examination:  GENERAL: No acute distress.  Appears well.  HEENT: MMM.  Vision and hearing grossly intact.  NECK: Supple.  No apparent JVD.  RESP:  No IWOB.   Diminished aeration with rhonchi bilaterally CVS:  RRR. Heart sounds normal.  ABD/GI/GU: Bowel sounds present. Soft. Non tender.  MSK/EXT:  Moves extremities. No apparent deformity or edema.  SKIN: no apparent skin lesion or wound NEURO: Awake, alert and oriented appropriately.  No apparent focal neuro deficit. PSYCH: Calm. Normal affect.  Procedures:  None  Assessment & Plan: Acute on chronic respiratory failure with hypoxia due to COVID-19 infection versus HAP Acute exacerbation of COPD. Patient was hospitalized and completed remdesivir and Decadron course from 11/19-11/24.  She returned with subjective fever, shortness of breath and productive cough.  PCT and LA  negative.  BCx with coag negative staph in 1/2 bottles likely contaminant.  Received ceftriaxone and azithromycin on 12/5.  Vancomycin 12/6-12/8.  Cefepime 12/6-12/9. Briefly started on Decadron which was discontinued due to hyperglycemia.  -Clinically improving but requiring 4 L.  Inflammatory markers remains elevated.  -Restart Decadron and closely monitor inflammatory markers -Continue inhalers, mucolytic's, antitussive and vitamins. -Incentive spirometry, OOB, PT/OT -Wean oxygen-goal saturation 88 to 92% given history of COPD Recent Labs    06/04/19 0235 06/05/19 0204 06/06/19 0030  DDIMER 3.69* 5.33* 5.19*  FERRITIN 183 193  --   CRP 11.4* 10.4* 10.8*    Uncontrolled DM-2 with hyperglycemia: A1c 8.9% on 05/16/2019.  CBG  elevated. CBG (last 3)  Recent Labs    06/05/19 2127 06/06/19 0759 06/06/19 1124  GLUCAP 119* 167* 172*  -Increase Levemir 6 to 10 units twice daily and NovoLog AC. -NovoLog 3 units AC -Continue SSI and Tradjenta. -Continue statin and CBG monitoring.  HTN/CAD/CABG in 07/2018: Stable. -Continue home aspirin, metoprolol, statin and Zetia. -Eliquis as below.  AKI on CKD-3B/azotemia: Cr up trended after initial improvement.  Not on nephrotoxic meds. -Continue monitoring  Normocytic anemia:  Likely anemia of chronic/renal disease.  H&H stable. -Hgb 9-10 (baseline)>> 8.8  Paroxysmal A. fib: Not on anticoagulation.  Followed by Dr. Koren Bound at [facility]. She has a high risk for blood clot with history of PE and Covid.  She also have elevated CHA2DS-2-VASc score.  Tolerated Eliquis well in the past.  -Started Eliquis-after risk-benefit discussion.  History of PE in 08/2018: No longer on anticoagulation.  Reportedly stopped Eliquis after 6 months. -Eliquis as above.               DVT prophylaxis: Eliquis Code Status: Full code Family Communication: Attempted to call patient's sister for update but no answer. Disposition Plan: Remains inpatient due to hypoxemic respiratory failure Consultants: None   Microbiology summarized: Blood culture with coag negative staph in 1 out of 2 bottles MRSA PCR negative. COVID-19 positive on 05/15/2019.  Sch Meds:  Scheduled Meds: . apixaban  5 mg Oral BID  . atorvastatin  40 mg Oral QHS  . ezetimibe  10 mg Oral Daily  . fluticasone  1 spray Each Nare Daily  . insulin aspart  0-15 Units Subcutaneous TID WC  . insulin aspart  0-5 Units Subcutaneous QHS  . insulin aspart  3 Units Subcutaneous TID WC  . insulin detemir  6 Units Subcutaneous QHS  . Ipratropium-Albuterol  1 puff Inhalation TID  . linagliptin  5 mg Oral Daily  . mouth rinse  15 mL Mouth Rinse BID  . metoprolol succinate  25 mg Oral Daily  . mometasone-formoterol  2 puff Inhalation BID  . multivitamin with minerals  1 tablet Oral Daily  . pantoprazole  40 mg Oral Daily  . vitamin C  500 mg Oral Daily  . zinc sulfate  220 mg Oral Daily   Continuous Infusions: . sodium chloride Stopped (06/04/19 1108)   PRN Meds:.sodium chloride, chlorpheniramine-HYDROcodone, dextrose, guaiFENesin-dextromethorphan  Antimicrobials: Anti-infectives (From admission, onward)   Start     Dose/Rate Route Frequency Ordered Stop   06/03/19 1000  vancomycin (VANCOCIN) IVPB 1000  mg/200 mL premix  Status:  Discontinued     1,000 mg 200 mL/hr over 60 Minutes Intravenous Every 48 hours 06/01/19 0949 06/02/19 1020   06/03/19 0000  vancomycin (VANCOCIN) IVPB 1000 mg/200 mL premix  Status:  Discontinued     1,000 mg 200 mL/hr over 60 Minutes Intravenous Every 36 hours 06/02/19 1020 06/03/19 1149   06/01/19 1100  ceFEPIme (MAXIPIME) 2 g in sodium chloride 0.9 % 100 mL IVPB  Status:  Discontinued     2 g 200 mL/hr over 30 Minutes Intravenous Daily 06/01/19 0944 06/04/19 1052   06/01/19 1100  vancomycin (VANCOCIN) 1,500 mg in sodium chloride 0.9 % 500 mL IVPB     1,500 mg 250 mL/hr over 120 Minutes Intravenous  Once 06/01/19 0948 06/01/19 2130   05/31/19 1145  cefTRIAXone (ROCEPHIN) 1 g in sodium chloride 0.9 % 100 mL IVPB     1 g 200 mL/hr over 30 Minutes Intravenous  Once 05/31/19 1143 05/31/19 1340  05/31/19 1145  azithromycin (ZITHROMAX) 500 mg in sodium chloride 0.9 % 250 mL IVPB     500 mg 250 mL/hr over 60 Minutes Intravenous  Once 05/31/19 1143 05/31/19 1512       I have personally reviewed the following labs and images: CBC: Recent Labs  Lab 06/02/19 0242 06/03/19 0232 06/04/19 0235 06/05/19 0204 06/06/19 0030  WBC 10.5 8.2 9.2 7.9 7.9  NEUTROABS 9.8* 7.0 6.9 6.1 6.0  HGB 9.4* 8.8* 8.8* 8.2* 8.9*  HCT 29.1* 27.5* 28.5* 26.0* 28.1*  MCV 93.0 94.2 94.4 94.2 94.9  PLT 86* 79* 97* 97* 134*   BMP &GFR Recent Labs  Lab 06/01/19 0447 06/02/19 0242 06/03/19 0232 06/04/19 0235 06/05/19 0204 06/06/19 0030  NA  --  139 137 138 136 134*  K  --  3.7 3.9 3.8 3.9 4.6  CL  --  106 105 106 104 101  CO2  --  21* 22 23 23 24   GLUCOSE  --  224* 173* 72 69* 158*  BUN  --  55* 52* 43* 33* 34*  CREATININE  --  1.54* 1.49* 1.51* 1.57* 1.85*  CALCIUM  --  8.7* 8.3* 8.0* 7.8* 8.1*  MG 2.8* 2.7* 2.5* 2.3 2.1  --   PHOS 4.1 3.7 2.2* 2.9 2.9  --    Estimated Creatinine Clearance: 24.8 mL/min (A) (by C-G formula based on SCr of 1.85 mg/dL (H)). Liver &  Pancreas: Recent Labs  Lab 06/02/19 0242 06/03/19 0232 06/04/19 0235 06/05/19 0204 06/06/19 0030  AST 17 27 15  14* 17  ALT 20 29 22 19 22   ALKPHOS 52 55 59 57 63  BILITOT 0.5 0.8 0.7 0.8 0.8  PROT 6.0* 5.9* 5.6* 5.4* 5.8*  ALBUMIN 2.5* 2.5* 2.1* 2.0* 2.4*   No results for input(s): LIPASE, AMYLASE in the last 168 hours. No results for input(s): AMMONIA in the last 168 hours. Diabetic: No results for input(s): HGBA1C in the last 72 hours. Recent Labs  Lab 06/05/19 1139 06/05/19 1613 06/05/19 2127 06/06/19 0759 06/06/19 1124  GLUCAP 211* 126* 119* 167* 172*   Cardiac Enzymes: No results for input(s): CKTOTAL, CKMB, CKMBINDEX, TROPONINI in the last 168 hours. No results for input(s): PROBNP in the last 8760 hours. Coagulation Profile: No results for input(s): INR, PROTIME in the last 168 hours. Thyroid Function Tests: No results for input(s): TSH, T4TOTAL, FREET4, T3FREE, THYROIDAB in the last 72 hours. Lipid Profile: No results for input(s): CHOL, HDL, LDLCALC, TRIG, CHOLHDL, LDLDIRECT in the last 72 hours. Anemia Panel: Recent Labs    06/04/19 0235 06/05/19 0204  FERRITIN 183 193   Urine analysis:    Component Value Date/Time   COLORURINE YELLOW 05/16/2019 0158   APPEARANCEUR CLEAR 05/16/2019 0158   LABSPEC 1.010 05/16/2019 0158   PHURINE 6.0 05/16/2019 0158   GLUCOSEU NEGATIVE 05/16/2019 0158   HGBUR NEGATIVE 05/16/2019 0158   BILIRUBINUR NEGATIVE 05/16/2019 0158   KETONESUR NEGATIVE 05/16/2019 0158   PROTEINUR 30 (A) 05/16/2019 0158   NITRITE NEGATIVE 05/16/2019 0158   LEUKOCYTESUR TRACE (A) 05/16/2019 0158   Sepsis Labs: Invalid input(s): PROCALCITONIN, [place]  Microbiology: Recent Results (from the past 240 hour(s))  Blood culture (routine x 2)     Status: Abnormal (Preliminary result)   Collection Time: 05/31/19 11:41 AM   Specimen: Right Antecubital; Blood  Result Value Ref Range Status   Specimen Description   Final    RIGHT  ANTECUBITAL Performed at [facility], [place] [address] [place], [place]  [address]    Special Requests   Final    BOTTLES DRAWN AEROBIC AND ANAEROBIC Blood Culture adequate volume Performed at [facility] [address] [place], [place] [address]    Culture  Setup Time   Final    GRAM POSITIVE COCCI IN CLUSTERS ANAEROBIC BOTTLE ONLY CRITICAL RESULT CALLED TO, READ BACK BY AND VERIFIED WITH: Shelda Jakes PHARMD, AT 1738 06/01/19 BY D. Rico Sheehan POSITIVE RODS AEROBIC BOTTLE ONLY CRITICAL RESULT CALLED TO, READ BACK BY AND VERIFIED WITH: J LEGGE PHARMD 2006 06/02/19 A BROWNING    Culture (A)  Final    STAPHYLOCOCCUS SPECIES (COAGULASE NEGATIVE) THE SIGNIFICANCE OF ISOLATING THIS ORGANISM FROM A SINGLE SET OF BLOOD CULTURES WHEN MULTIPLE SETS ARE DRAWN IS UNCERTAIN. PLEASE NOTIFY THE MICROBIOLOGY DEPARTMENT WITHIN ONE WEEK IF SPECIATION AND SENSITIVITIES ARE REQUIRED. CULTURE REINCUBATED FOR BETTER GROWTH Performed at [facility], [place] [address] [place], [place] [address]    Report Status PENDING  Incomplete  Blood culture (routine x 2)     Status: None   Collection Time: 05/31/19 11:46 AM   Specimen: Left Antecubital; Blood  Result Value Ref Range Status   Specimen Description   Final    LEFT ANTECUBITAL Performed at [facility] [address] [place], [place] [address]    Special Requests   Final    BOTTLES DRAWN AEROBIC AND ANAEROBIC Blood Culture adequate volume Performed at [facility] [address] [place], [place] [address]    Culture   Final    NO GROWTH 5 DAYS Performed at [facility], [place] [address] [place], [place] [address]    Report Status 06/05/2019 FINAL  Final  MRSA PCR Screening     Status: None   Collection Time: 06/01/19 10:26 AM   Specimen: Nasal Mucosa; Nasopharyngeal  Result Value Ref Range Status   MRSA by PCR NEGATIVE NEGATIVE Final    Comment:        The  GeneXpert MRSA Assay (FDA approved for NASAL specimens only), is one component of a comprehensive MRSA colonization surveillance program. It is not intended to diagnose MRSA infection nor to guide or monitor treatment for MRSA infections. Performed at [facility], [place] [address] [place],  [address]     Radiology Studies: No results found.   Takashi Korol T. [place]  If 7PM-7AM, please contact night-coverage [email or web address] Password [email or web address] 06/06/2019, 11:51 AM

## 2019-06-07 LAB — GLUCOSE, CAPILLARY
Glucose-Capillary: 251 mg/dL — ABNORMAL HIGH (ref 70–99)
Glucose-Capillary: 369 mg/dL — ABNORMAL HIGH (ref 70–99)
Glucose-Capillary: 230 mg/dL — ABNORMAL HIGH (ref 70–99)
Glucose-Capillary: 268 mg/dL — ABNORMAL HIGH (ref 70–99)

## 2019-06-07 LAB — D-DIMER, QUANTITATIVE: D-Dimer, Quant: 3.86 ug/mL-FEU — ABNORMAL HIGH (ref 0.00–0.50)

## 2019-06-07 LAB — C-REACTIVE PROTEIN: CRP: 9.1 mg/dL — ABNORMAL HIGH (ref <1.0)

## 2019-06-07 MED ORDER — INSULIN ASPART 100 UNIT/ML ~~LOC~~ SOLN
0.0000 [IU] | Freq: Three times a day (TID) | SUBCUTANEOUS | Status: DC
  Administered 2019-06-07: 7 [IU] via SUBCUTANEOUS
  Administered 2019-06-08: 4 [IU] via SUBCUTANEOUS
  Administered 2019-06-08: 11 [IU] via SUBCUTANEOUS
  Administered 2019-06-08: 12:00:00 7 [IU] via SUBCUTANEOUS
  Administered 2019-06-09: 3 [IU] via SUBCUTANEOUS
  Administered 2019-06-09: 14:00:00 7 [IU] via SUBCUTANEOUS
  Administered 2019-06-09: 15 [IU] via SUBCUTANEOUS
  Administered 2019-06-10: 7 [IU] via SUBCUTANEOUS

## 2019-06-07 MED ORDER — INSULIN ASPART 100 UNIT/ML ~~LOC~~ SOLN
0.0000 [IU] | Freq: Every day | SUBCUTANEOUS | Status: DC
  Administered 2019-06-07: 3 [IU] via SUBCUTANEOUS
  Administered 2019-06-09: 2 [IU] via SUBCUTANEOUS

## 2019-06-07 MED ORDER — INSULIN ASPART 100 UNIT/ML ~~LOC~~ SOLN
8.0000 [IU] | Freq: Three times a day (TID) | SUBCUTANEOUS | Status: DC
  Administered 2019-06-07 – 2019-06-10 (×8): 8 [IU] via SUBCUTANEOUS

## 2019-06-07 MED ORDER — INSULIN ASPART 100 UNIT/ML ~~LOC~~ SOLN
6.0000 [IU] | Freq: Three times a day (TID) | SUBCUTANEOUS | Status: DC
  Administered 2019-06-07: 13:00:00 6 [IU] via SUBCUTANEOUS

## 2019-06-07 MED ORDER — INSULIN DETEMIR 100 UNIT/ML ~~LOC~~ SOLN
15.0000 [IU] | Freq: Every day | SUBCUTANEOUS | Status: DC
  Administered 2019-06-07: 15 [IU] via SUBCUTANEOUS
  Filled 2019-06-07 (×2): qty 0.15

## 2019-06-07 NOTE — Progress Notes (Signed)
PROGRESS NOTE  Andrea [name] [id number] DOB: June 30, 1945   PCP: Chester Holstein, MD  Patient is from: Home  DOA: 05/31/2019 LOS: 7  Brief Narrative / Interim history: 73 year old female with history of COPD, chronic RF, DM-2, CKD-3, PE, CAD, HTN, and recent hospitalization for COVID-19 from 11/19-11/24 status post Decadron and remdesivir return to ED with subjective fever, SOB and productive cough.  In ED, HDS.  Saturation 90% on 3 L by [place].  CRP 14.8.  LA 1.0.  Procalcitonin less than 0.1.  D-dimer 2.5.  CXR with mild patchy opacities in the lingula and bilateral lower lobe suspicious for pneumonia. Patient was admitted for acute on chronic respiratory failure with hypoxia due to possible HAP and COPD exacerbation.  She was started on systemic steroid.  However, she had uncontrolled hyperglycemia up to 700.  Steroid stopped.  Briefly put on insulin drips and transferred to stepdown.  She came off insulin drip.  Transferred back to telemetry.  Restarted on Decadron with improvement in her breathing.  Subjective: No major events overnight of this morning.  Reports improvement in her breathing and cough.  No complaint this morning.  Still on 2 L by nasal cannula to maintain appropriate saturation.  CBG elevated overnight and this morning.  Insulin dose adjusted.  Denies GI or UTI symptoms.  Objective: Vitals:   06/06/19 2019 06/07/19 0405 06/07/19 0920 06/07/19 1230  BP: (!) 116/53 (!) 125/50  (!) 106/57  Pulse: 79 75  74  Resp: 16 18  19   Temp: 98 F (36.7 C) 97.6 F (36.4 C)  97.7 F (36.5 C)  TempSrc: Oral Oral  Oral  SpO2: 92% 94% 92% 93%  Weight:      Height:        Intake/Output Summary (Last 24 hours) at 06/07/2019 1430 Last data filed at 06/07/2019 1235 Gross per 24 hour  Intake 660 ml  Output 1550 ml  Net -890 ml   Filed Weights   05/31/19 0847 06/01/19 0102  Weight: 68 kg 67.9 kg    Examination: GENERAL: No acute distress.  Appears well.  HEENT: MMM.   Vision and hearing grossly intact.  NECK: Supple.  No apparent JVD.  RESP:  No IWOB.  Fair aeration with rhonchi bilaterally CVS:  RRR. Heart sounds normal.  ABD/GI/GU: Bowel sounds present. Soft. Non tender.  MSK/EXT:  Moves extremities. No apparent deformity or edema.  SKIN: no apparent skin lesion or wound NEURO: Awake, alert and oriented appropriately.  No apparent focal neuro deficit. PSYCH: Calm. Normal affect.  Procedures:  None  Assessment & Plan: Acute on chronic respiratory failure with hypoxia due to COVID-19 infection versus HAP Acute exacerbation of COPD. Patient was hospitalized and completed remdesivir and Decadron course from 11/19-11/24.  She returned with subjective fever, shortness of breath and productive cough.  PCT and LA  negative.  BCx with coag negative staph in 1/2 bottles likely contaminant.  Received ceftriaxone and azithromycin on 12/5.  Vancomycin 12/6-12/8.  Cefepime 12/6-12/9. Briefly started on Decadron which was discontinued due to hyperglycemia.  Restarted on Decadron on 12/11. -Clinically improving.  Saturation 92 to 93% on 2 L by [place]. -Continue Decadron 12/11>> -Continue inhalers, mucolytic's, antitussive and vitamins. -Incentive spirometry, OOB, PT/OT -Wean oxygen-goal saturation 88 to 92% given history of COPD Recent Labs    06/05/19 0204 06/06/19 0030 06/07/19 0307  DDIMER 5.33* 5.19* 3.86*  FERRITIN 193  --   --   CRP 10.4* 10.8* 9.1*    Uncontrolled DM-2  with hyperglycemia: A1c 8.9% on 05/16/2019.  CBG elevated. CBG (last 3)  Recent Labs    06/06/19 2017 06/07/19 0826 06/07/19 1228  GLUCAP 435* 251* 369*  -Increase Levemir 10 to 15 units twice daily,  -Increase NovoLog to 8 units AC -Increase SSI to resistant -Continue Tradjenta -Continue statin and CBG monitoring.  HTN/CAD/CABG in 07/2018: Stable. -Continue home aspirin, metoprolol, statin and Zetia. -Eliquis as below.  AKI on CKD-3B/azotemia: Cr up trended after initial  improvement.  Not on nephrotoxic meds. -Continue monitoring  Normocytic anemia: Likely anemia of chronic/renal disease.  H&H stable. -Hgb 9-10 (baseline)>> 8.8  Paroxysmal A. fib: Not on anticoagulation.  Follow[facility] by [facility]n [facility]. She has a high risk for blood clot with history of PE and Covid.  She also have elevated CHA2DS-2-VASc score.  Tolerated Eliquis well in the past.  -Started Eliquis-after risk-benefit discussion.  History of PE in 08/2018: No longer on anticoagulation.  Reportedly stopped Eliquis after 6 months. -Eliquis as above.               DVT prophylaxis: Eliquis Code Status: Full code Family Communication: Attempted to call patient's sister and granddaughter for update but no answer. Disposition Plan: Remains inpatient due to hypoxemic respiratory failure Consultants: None   Microbiology summarized: Blood culture with coag negative staph in 1 out of 2 bottles MRSA PCR negative. COVID-19 positive on 05/15/2019.  Sch Meds:  Scheduled Meds: . apixaban  5 mg Oral BID  . atorvastatin  40 mg Oral QHS  . dexamethasone (DECADRON) injection  6 mg Intravenous Daily  . ezetimibe  10 mg Oral Daily  . fluticasone  1 spray Each Nare Daily  . insulin aspart  0-15 Units Subcutaneous TID WC  . insulin aspart  0-5 Units Subcutaneous QHS  . insulin aspart  6 Units Subcutaneous TID WC  . insulin detemir  15 Units Subcutaneous QHS  . Ipratropium-Albuterol  1 puff Inhalation TID  . linagliptin  5 mg Oral Daily  . mouth rinse  15 mL Mouth Rinse BID  . metoprolol succinate  25 mg Oral Daily  . mometasone-formoterol  2 puff Inhalation BID  . multivitamin with minerals  1 tablet Oral Daily  . pantoprazole  40 mg Oral Daily  . vitamin C  500 mg Oral Daily  . zinc sulfate  220 mg Oral Daily   Continuous Infusions: . sodium chloride Stopped (06/04/19 1108)   PRN Meds:.sodium chloride, chlorpheniramine-HYDROcodone, dextrose, guaiFENesin-dextromethorphan   Antimicrobials: Anti-infectives (From admission, onward)   Start     Dose/Rate Route Frequency Ordered Stop   06/03/19 1000  vancomycin (VANCOCIN) IVPB 1000 mg/200 mL premix  Status:  Discontinued     1,000 mg 200 mL/hr over 60 Minutes Intravenous Every 48 hours 06/01/19 0949 06/02/19 1020   06/03/19 0000  vancomycin (VANCOCIN) IVPB 1000 mg/200 mL premix  Status:  Discontinued     1,000 mg 200 mL/hr over 60 Minutes Intravenous Every 36 hours 06/02/19 1020 06/03/19 1149   06/01/19 1100  ceFEPIme (MAXIPIME) 2 g in sodium chloride 0.9 % 100 mL IVPB  Status:  Discontinued     2 g 200 mL/hr over 30 Minutes Intravenous Daily 06/01/19 0944 06/04/19 1052   06/01/19 1100  vancomycin (VANCOCIN) 1,500 mg in sodium chloride 0.9 % 500 mL IVPB     1,500 mg 250 mL/hr over 120 Minutes Intravenous  Once 06/01/19 0948 06/01/19 2130   05/31/19 1145  cefTRIAXone (ROCEPHIN) 1 g in sodium chloride 0.9 %  100 mL IVPB     1 g 200 mL/hr over 30 Minutes Intravenous  Once 05/31/19 1143 05/31/19 1340   05/31/19 1145  azithromycin (ZITHROMAX) 500 mg in sodium chloride 0.9 % 250 mL IVPB     500 mg 250 mL/hr over 60 Minutes Intravenous  Once 05/31/19 1143 05/31/19 1512       I have personally reviewed the following labs and images: CBC: Recent Labs  Lab 06/02/19 0242 06/03/19 0232 06/04/19 0235 06/05/19 0204 06/06/19 0030  WBC 10.5 8.2 9.2 7.9 7.9  NEUTROABS 9.8* 7.0 6.9 6.1 6.0  HGB 9.4* 8.8* 8.8* 8.2* 8.9*  HCT 29.1* 27.5* 28.5* 26.0* 28.1*  MCV 93.0 94.2 94.4 94.2 94.9  PLT 86* 79* 97* 97* 134*   BMP &GFR Recent Labs  Lab 06/01/19 0447 06/02/19 0242 06/03/19 0232 06/04/19 0235 06/05/19 0204 06/06/19 0030  NA  --  139 137 138 136 134*  K  --  3.7 3.9 3.8 3.9 4.6  CL  --  106 105 106 104 101  CO2  --  21* 22 23 23 24   GLUCOSE  --  224* 173* 72 69* 158*  BUN  --  55* 52* 43* 33* 34*  CREATININE  --  1.54* 1.49* 1.51* 1.57* 1.85*  CALCIUM  --  8.7* 8.3* 8.0* 7.8* 8.1*  MG 2.8* 2.7* 2.5* 2.3  2.1  --   PHOS 4.1 3.7 2.2* 2.9 2.9  --    Estimated Creatinine Clearance: 24.8 mL/min (A) (by C-G formula based on SCr of 1.85 mg/dL (H)). Liver & Pancreas: Recent Labs  Lab 06/02/19 0242 06/03/19 0232 06/04/19 0235 06/05/19 0204 06/06/19 0030  AST 17 27 15  14* 17  ALT 20 29 22 19 22   ALKPHOS 52 55 59 57 63  BILITOT 0.5 0.8 0.7 0.8 0.8  PROT 6.0* 5.9* 5.6* 5.4* 5.8*  ALBUMIN 2.5* 2.5* 2.1* 2.0* 2.4*   No results for input(s): LIPASE, AMYLASE in the last 168 hours. No results for input(s): AMMONIA in the last 168 hours. Diabetic: No results for input(s): HGBA1C in the last 72 hours. Recent Labs  Lab 06/06/19 1124 06/06/19 1700 06/06/19 2017 06/07/19 0826 06/07/19 1228  GLUCAP 172* 240* 435* 251* 369*   Cardiac Enzymes: No results for input(s): CKTOTAL, CKMB, CKMBINDEX, TROPONINI in the last 168 hours. No results for input(s): PROBNP in the last 8760 hours. Coagulation Profile: No results for input(s): INR, PROTIME in the last 168 hours. Thyroid Function Tests: No results for input(s): TSH, T4TOTAL, FREET4, T3FREE, THYROIDAB in the last 72 hours. Lipid Profile: No results for input(s): CHOL, HDL, LDLCALC, TRIG, CHOLHDL, LDLDIRECT in the last 72 hours. Anemia Panel: Recent Labs    06/05/19 0204  FERRITIN 193   Urine analysis:    Component Value Date/Time   COLORURINE YELLOW 05/16/2019 0158   APPEARANCEUR CLEAR 05/16/2019 0158   LABSPEC 1.010 05/16/2019 0158   PHURINE 6.0 05/16/2019 0158   GLUCOSEU NEGATIVE 05/16/2019 0158   HGBUR NEGATIVE 05/16/2019 0158   BILIRUBINUR NEGATIVE 05/16/2019 0158   KETONESUR NEGATIVE 05/16/2019 0158   PROTEINUR 30 (A) 05/16/2019 0158   NITRITE NEGATIVE 05/16/2019 0158   LEUKOCYTESUR TRACE (A) 05/16/2019 0158   Sepsis Labs: Invalid input(s): PROCALCITONIN, [place]  Microbiology: Recent Results (from the past 240 hour(s))  Blood culture (routine x 2)     Status: Abnormal   Collection Time: 05/31/19 11:41 AM   Specimen:  Right Antecubital; Blood  Result Value Ref Range Status   Specimen Description  Final    RIGHT ANTECUBITAL Performed at [facility], [place] [address], [place], [place] [address]    Special Requests   Final    BOTTLES DRAWN AEROBIC AND ANAEROBIC Blood Culture adequate volume Performed at [place] [address] [place], [place] [address]    Culture  Setup Time   Final    GRAM POSITIVE COCCI IN CLUSTERS ANAEROBIC BOTTLE ONLY CRITICAL RESULT CALLED TO, READ BACK BY AND VERIFIED WITH: Shelda Jakes PHARMD, AT 1738 06/01/19 BY D. Rico Sheehan POSITIVE RODS AEROBIC BOTTLE ONLY CRITICAL RESULT CALLED TO, READ BACK BY AND VERIFIED WITH: J LEGGE PHARMD 2006 06/02/19 A BROWNING    Culture (A)  Final    STAPHYLOCOCCUS SPECIES (COAGULASE NEGATIVE) THE SIGNIFICANCE OF ISOLATING THIS ORGANISM FROM A SINGLE SET OF BLOOD CULTURES WHEN MULTIPLE SETS ARE DRAWN IS UNCERTAIN. PLEASE NOTIFY THE MICROBIOLOGY DEPARTMENT WITHIN ONE WEEK IF SPECIATION AND SENSITIVITIES ARE REQUIRED. DIPHTHEROIDS(CORYNEBACTERIUM SPECIES) Standardized susceptibility testing for this organism is not available. Performed at [facility], [place] [address] [place], [place] [address]    Report Status 06/06/2019 FINAL  Final  Blood culture (routine x 2)     Status: None   Collection Time: 05/31/19 11:46 AM   Specimen: Left Antecubital; Blood  Result Value Ref Range Status   Specimen Description   Final    LEFT ANTECUBITAL Performed at [place] [address], [place], [place] [address]    Special Requests   Final    BOTTLES DRAWN AEROBIC AND ANAEROBIC Blood Culture adequate volume Performed at [place] [address] [place], [place] [address]    Culture   Final    NO GROWTH 5 DAYS Performed at [facility], [place] [address] [place], [place] [address]    Report Status 06/05/2019 FINAL  Final  MRSA PCR Screening     Status: None    Collection Time: 06/01/19 10:26 AM   Specimen: Nasal Mucosa; Nasopharyngeal  Result Value Ref Range Status   MRSA by PCR NEGATIVE NEGATIVE Final    Comment:        The GeneXpert MRSA Assay (FDA approved for NASAL specimens only), is one component of a comprehensive MRSA colonization surveillance program. It is not intended to diagnose MRSA infection nor to guide or monitor treatment for MRSA infections. Performed at [facility], [place] [address], [place], [place] [address]     Radiology Studies: No results found.   Wallice Granville T. [place]  If 7PM-7AM, please contact night-coverage [email or web address] Password [email or web address] 06/07/2019, 2:30 PM

## 2019-06-08 DIAGNOSIS — R7989 Other specified abnormal findings of blood chemistry: Secondary | ICD-10-CM

## 2019-06-08 LAB — CBC WITH DIFFERENTIAL/PLATELET
Abs Immature Granulocytes: 0.08 10*3/uL — ABNORMAL HIGH (ref 0.00–0.07)
Basophils Absolute: 0 10*3/uL (ref 0.0–0.1)
Basophils Relative: 0 %
Eosinophils Absolute: 0 10*3/uL (ref 0.0–0.5)
Eosinophils Relative: 0 %
HCT: 27.2 % — ABNORMAL LOW (ref 36.0–46.0)
Hemoglobin: 8.2 g/dL — ABNORMAL LOW (ref 12.0–15.0)
Immature Granulocytes: 1 %
Lymphocytes Relative: 9 %
Lymphs Abs: 0.7 10*3/uL (ref 0.7–4.0)
MCH: 29.2 pg (ref 26.0–34.0)
MCHC: 30.1 g/dL (ref 30.0–36.0)
MCV: 96.8 fL (ref 80.0–100.0)
Monocytes Absolute: 0.3 10*3/uL (ref 0.1–1.0)
Monocytes Relative: 5 %
Neutro Abs: 5.9 10*3/uL (ref 1.7–7.7)
Neutrophils Relative %: 85 %
Platelets: 199 10*3/uL (ref 150–400)
RBC: 2.81 MIL/uL — ABNORMAL LOW (ref 3.87–5.11)
RDW: 13.9 % (ref 11.5–15.5)
WBC: 7 10*3/uL (ref 4.0–10.5)
nRBC: 0 % (ref 0.0–0.2)

## 2019-06-08 LAB — COMPREHENSIVE METABOLIC PANEL
ALT: 31 U/L (ref 0–44)
AST: 27 U/L (ref 15–41)
Albumin: 2.3 g/dL — ABNORMAL LOW (ref 3.5–5.0)
Alkaline Phosphatase: 74 U/L (ref 38–126)
Anion gap: 10 (ref 5–15)
BUN: 50 mg/dL — ABNORMAL HIGH (ref 8–23)
CO2: 24 mmol/L (ref 22–32)
Calcium: 8.7 mg/dL — ABNORMAL LOW (ref 8.9–10.3)
Chloride: 102 mmol/L (ref 98–111)
Creatinine, Ser: 1.63 mg/dL — ABNORMAL HIGH (ref 0.44–1.00)
GFR calc Af Amer: 36 mL/min — ABNORMAL LOW (ref >60)
GFR calc non Af Amer: 31 mL/min — ABNORMAL LOW (ref >60)
Glucose, Bld: 326 mg/dL — ABNORMAL HIGH (ref 70–99)
Potassium: 5.2 mmol/L — ABNORMAL HIGH (ref 3.5–5.1)
Sodium: 136 mmol/L (ref 135–145)
Total Bilirubin: 0.4 mg/dL (ref 0.3–1.2)
Total Protein: 6.3 g/dL — ABNORMAL LOW (ref 6.5–8.1)

## 2019-06-08 LAB — C-REACTIVE PROTEIN: CRP: 4.1 mg/dL — ABNORMAL HIGH (ref <1.0)

## 2019-06-08 LAB — GLUCOSE, CAPILLARY
Glucose-Capillary: 283 mg/dL — ABNORMAL HIGH (ref 70–99)
Glucose-Capillary: 232 mg/dL — ABNORMAL HIGH (ref 70–99)
Glucose-Capillary: 197 mg/dL — ABNORMAL HIGH (ref 70–99)
Glucose-Capillary: 216 mg/dL — ABNORMAL HIGH (ref 70–99)
Glucose-Capillary: 180 mg/dL — ABNORMAL HIGH (ref 70–99)

## 2019-06-08 LAB — D-DIMER, QUANTITATIVE: D-Dimer, Quant: 3.47 ug/mL-FEU — ABNORMAL HIGH (ref 0.00–0.50)

## 2019-06-08 MED ORDER — INSULIN DETEMIR 100 UNIT/ML ~~LOC~~ SOLN
20.0000 [IU] | Freq: Every day | SUBCUTANEOUS | Status: DC
  Administered 2019-06-08 – 2019-06-09 (×2): 20 [IU] via SUBCUTANEOUS
  Filled 2019-06-08 (×3): qty 0.2

## 2019-06-08 NOTE — Progress Notes (Signed)
Resumed care of this patient from [place], [place]. Agree with his previous assessment. Will continue to monitor.

## 2019-06-08 NOTE — Progress Notes (Signed)
PROGRESS NOTE  Andrea [name] [id number] DOB: [birth year]   PCP: Chester Holstein, MD  Patient is from: Home  DOA: 05/31/2019 LOS: 8  Brief Narrative / Interim history: 73 year old female with history of COPD, chronic RF on 2L PRN, DM-2, CKD-3, PE, CAD, HTN, and recent hospitalization for COVID-19 from 11/19-11/24 status post Decadron and remdesivir return to ED with subjective fever, SOB and productive cough.  In ED, HDS.  Saturation 90% on 3 L by [place].  CRP 14.8.  LA 1.0.  Procalcitonin less than 0.1.  D-dimer 2.5.  CXR with mild patchy opacities in the lingula and bilateral lower lobe suspicious for pneumonia. Patient was admitted for acute on chronic respiratory failure with hypoxia due to possible HAP and COPD exacerbation.  She was started on systemic steroid.  However, she had uncontrolled hyperglycemia up to 700.  Steroid stopped.  Briefly put on insulin drips and transferred to stepdown.  She came off insulin drip.  Transferred back to telemetry.  Restarted on Decadron with improvement in her breathing.  Subjective: No major events overnight of this morning.  Reports improvement in her breathing and cough.  She says she is about 70% close to her baseline.  Denies GI or UTI symptoms.  Objective: Vitals:   06/07/19 2029 06/08/19 0309 06/08/19 0923 06/08/19 1150  BP: (!) 117/56 (!) 134/51  (!) 100/48  Pulse: 81 80  74  Resp: 16 18  19   Temp: 97.8 F (36.6 C) 97.6 F (36.4 C)  98 F (36.7 C)  TempSrc: Oral Oral  Oral  SpO2: 95% 96% 90% 90%  Weight:      Height:        Intake/Output Summary (Last 24 hours) at 06/08/2019 1344 Last data filed at 06/08/2019 1100 Gross per 24 hour  Intake 356 ml  Output 970 ml  Net -614 ml   Filed Weights   05/31/19 0847 06/01/19 0102  Weight: 68 kg 67.9 kg    Examination:  GENERAL: No acute distress.  Appears well.  HEENT: MMM.  Vision and hearing grossly intact.  NECK: Supple.  No apparent JVD.  RESP:  No IWOB.  Fair aeration  with mild rhonchi bilaterally. CVS:  RRR. Heart sounds normal.  ABD/GI/GU: Bowel sounds present. Soft. Non tender.  MSK/EXT:  Moves extremities. No apparent deformity or edema.  SKIN: no apparent skin lesion or wound NEURO: Awake, alert and oriented appropriately.  No apparent focal neuro deficit. PSYCH: Calm. Normal affect.  Procedures:  None  Assessment & Plan: Acute on chronic respiratory failure with hypoxia due to COVID-19 infection versus HAP Acute exacerbation of COPD. Patient was hospitalized and completed remdesivir and Decadron course from 11/19-11/24.  She returned with subjective fever, shortness of breath and productive cough.  PCT and LA  negative.  BCx with coag negative staph in 1/2 bottles likely contaminant.  Received ceftriaxone and azithromycin on 12/5.  Vancomycin 12/6-12/8.  Cefepime 12/6-12/9. Briefly started on Decadron which was discontinued due to hyperglycemia requiring insulin drip.  Hypoglycemia resolved.  Restarted on Decadron on 12/11. -Respiratory status and inflammatory markers improved. -Continue Decadron 12/11>> -Continue inhalers, mucolytic's, antitussive and vitamins. -Incentive spirometry, OOB, PT/OT -Wean oxygen-goal saturation 88 to 92% given history of COPD Recent Labs    06/06/19 0030 06/07/19 0307 06/08/19 0322  DDIMER 5.19* 3.86* 3.47*  CRP 10.8* 9.1* 4.1*    Uncontrolled DM-2 with hyperglycemia: A1c 8.9% on 05/16/2019.  CBG elevated but improved. Recent Labs    06/07/19 2022 06/08/19 0824 06/08/19  1148  GLUCAP 268* 283* 232*  -Increase Levemir 15-20 units twice daily,  -Continue NovoLog to 8 units AC and SSI-resistant -Continue Tradjenta -Continue statin and CBG monitoring.  HTN/CAD/CABG in 07/2018: Stable. -Continue home aspirin, metoprolol, statin and Zetia. -Eliquis as below.  AKI on CKD-3B/azotemia: Creatinine improved but azotemia worse.  Not on nephrotoxic meds. -Continue monitoring  Normocytic anemia: Likely anemia of  chronic/renal disease.  H&H relatively stable. -Hgb 9-10 (baseline)>> 8.8 -Check anemia panel.  Paroxysmal A. fib: Not on anticoagulation.  Followed by Dr. Koren Bound at [facility]. She has a high risk for blood clot with history of PE and Covid.  She also have elevated CHA2DS-2-VASc score.  Tolerated Eliquis well in the past.  -Started Eliquis-after risk-benefit discussion.  History of PE in 08/2018: No longer on anticoagulation.  Reportedly stopped Eliquis after 6 months. -Eliquis as above.    DVT prophylaxis: Eliquis Code Status: Full code Family Communication: Updated patient's granddaughter over the phone. Disposition Plan: Remains inpatient due to hypoxemic respiratory failure.  Final disposition after PT/OT. Consultants: None   Microbiology summarized: Blood culture with coag negative staph in 1 out of 2 bottles MRSA PCR negative. COVID-19 positive on 05/15/2019.  Sch Meds:  Scheduled Meds: . apixaban  5 mg Oral BID  . atorvastatin  40 mg Oral QHS  . dexamethasone (DECADRON) injection  6 mg Intravenous Daily  . ezetimibe  10 mg Oral Daily  . fluticasone  1 spray Each Nare Daily  . insulin aspart  0-20 Units Subcutaneous TID WC  . insulin aspart  0-5 Units Subcutaneous QHS  . insulin aspart  8 Units Subcutaneous TID WC  . insulin detemir  15 Units Subcutaneous QHS  . Ipratropium-Albuterol  1 puff Inhalation TID  . linagliptin  5 mg Oral Daily  . mouth rinse  15 mL Mouth Rinse BID  . metoprolol succinate  25 mg Oral Daily  . mometasone-formoterol  2 puff Inhalation BID  . multivitamin with minerals  1 tablet Oral Daily  . pantoprazole  40 mg Oral Daily  . vitamin C  500 mg Oral Daily  . zinc sulfate  220 mg Oral Daily   Continuous Infusions: . sodium chloride Stopped (06/04/19 1108)   PRN Meds:.sodium chloride, chlorpheniramine-HYDROcodone, dextrose, guaiFENesin-dextromethorphan  Antimicrobials: Anti-infectives (From admission, onward)   Start     Dose/Rate  Route Frequency Ordered Stop   06/03/19 1000  vancomycin (VANCOCIN) IVPB 1000 mg/200 mL premix  Status:  Discontinued     1,000 mg 200 mL/hr over 60 Minutes Intravenous Every 48 hours 06/01/19 0949 06/02/19 1020   06/03/19 0000  vancomycin (VANCOCIN) IVPB 1000 mg/200 mL premix  Status:  Discontinued     1,000 mg 200 mL/hr over 60 Minutes Intravenous Every 36 hours 06/02/19 1020 06/03/19 1149   06/01/19 1100  ceFEPIme (MAXIPIME) 2 g in sodium chloride 0.9 % 100 mL IVPB  Status:  Discontinued     2 g 200 mL/hr over 30 Minutes Intravenous Daily 06/01/19 0944 06/04/19 1052   06/01/19 1100  vancomycin (VANCOCIN) 1,500 mg in sodium chloride 0.9 % 500 mL IVPB     1,500 mg 250 mL/hr over 120 Minutes Intravenous  Once 06/01/19 0948 06/01/19 2130   05/31/19 1145  cefTRIAXone (ROCEPHIN) 1 g in sodium chloride 0.9 % 100 mL IVPB     1 g 200 mL/hr over 30 Minutes Intravenous  Once 05/31/19 1143 05/31/19 1340   05/31/19 1145  azithromycin (ZITHROMAX) 500 mg in sodium chloride 0.9 %  250 mL IVPB     500 mg 250 mL/hr over 60 Minutes Intravenous  Once 05/31/19 1143 05/31/19 1512       I have personally reviewed the following labs and images: CBC: Recent Labs  Lab 06/03/19 0232 06/04/19 0235 06/05/19 0204 06/06/19 0030 06/08/19 0322  WBC 8.2 9.2 7.9 7.9 7.0  NEUTROABS 7.0 6.9 6.1 6.0 5.9  HGB 8.8* 8.8* 8.2* 8.9* 8.2*  HCT 27.5* 28.5* 26.0* 28.1* 27.2*  MCV 94.2 94.4 94.2 94.9 96.8  PLT 79* 97* 97* 134* 199   BMP &GFR Recent Labs  Lab 06/02/19 0242 06/03/19 0232 06/04/19 0235 06/05/19 0204 06/06/19 0030 06/08/19 0322  NA 139 137 138 136 134* 136  K 3.7 3.9 3.8 3.9 4.6 5.2*  CL 106 105 106 104 101 102  CO2 21* 22 23 23 24 24   GLUCOSE 224* 173* 72 69* 158* 326*  BUN 55* 52* 43* 33* 34* 50*  CREATININE 1.54* 1.49* 1.51* 1.57* 1.85* 1.63*  CALCIUM 8.7* 8.3* 8.0* 7.8* 8.1* 8.7*  MG 2.7* 2.5* 2.3 2.1  --   --   PHOS 3.7 2.2* 2.9 2.9  --   --    Estimated Creatinine Clearance: 28.2  mL/min (A) (by C-G formula based on SCr of 1.63 mg/dL (H)). Liver & Pancreas: Recent Labs  Lab 06/03/19 0232 06/04/19 0235 06/05/19 0204 06/06/19 0030 06/08/19 0322  AST 27 15 14* 17 27  ALT 29 22 19 22 31   ALKPHOS 55 59 57 63 74  BILITOT 0.8 0.7 0.8 0.8 0.4  PROT 5.9* 5.6* 5.4* 5.8* 6.3*  ALBUMIN 2.5* 2.1* 2.0* 2.4* 2.3*   No results for input(s): LIPASE, AMYLASE in the last 168 hours. No results for input(s): AMMONIA in the last 168 hours. Diabetic: No results for input(s): HGBA1C in the last 72 hours. Recent Labs  Lab 06/07/19 1228 06/07/19 1752 06/07/19 2022 06/08/19 0824 06/08/19 1148  GLUCAP 369* 230* 268* 283* 232*   Cardiac Enzymes: No results for input(s): CKTOTAL, CKMB, CKMBINDEX, TROPONINI in the last 168 hours. No results for input(s): PROBNP in the last 8760 hours. Coagulation Profile: No results for input(s): INR, PROTIME in the last 168 hours. Thyroid Function Tests: No results for input(s): TSH, T4TOTAL, FREET4, T3FREE, THYROIDAB in the last 72 hours. Lipid Profile: No results for input(s): CHOL, HDL, LDLCALC, TRIG, CHOLHDL, LDLDIRECT in the last 72 hours. Anemia Panel: No results for input(s): VITAMINB12, FOLATE, FERRITIN, TIBC, IRON, RETICCTPCT in the last 72 hours. Urine analysis:    Component Value Date/Time   COLORURINE YELLOW 05/16/2019 0158   APPEARANCEUR CLEAR 05/16/2019 0158   LABSPEC 1.010 05/16/2019 0158   PHURINE 6.0 05/16/2019 0158   GLUCOSEU NEGATIVE 05/16/2019 0158   HGBUR NEGATIVE 05/16/2019 0158   BILIRUBINUR NEGATIVE 05/16/2019 0158   KETONESUR NEGATIVE 05/16/2019 0158   PROTEINUR 30 (A) 05/16/2019 0158   NITRITE NEGATIVE 05/16/2019 0158   LEUKOCYTESUR TRACE (A) 05/16/2019 0158   Sepsis Labs: Invalid input(s): PROCALCITONIN, [place]  Microbiology: Recent Results (from the past 240 hour(s))  Blood culture (routine x 2)     Status: Abnormal   Collection Time: 05/31/19 11:41 AM   Specimen: Right Antecubital; Blood    Result Value Ref Range Status   Specimen Description   Final    RIGHT ANTECUBITAL Performed at [place] [address], [place], [place] [address]    Special Requests   Final    BOTTLES DRAWN AEROBIC AND ANAEROBIC Blood Culture adequate volume Performed at [facility], [place] [address] [place], [place] [address]    Culture  Setup Time   Final    GRAM POSITIVE COCCI IN CLUSTERS ANAEROBIC BOTTLE ONLY CRITICAL RESULT CALLED TO, READ BACK BY AND VERIFIED WITH: Shelda Jakes PHARMD, AT 1738 06/01/19 BY D. Rico Sheehan POSITIVE RODS AEROBIC BOTTLE ONLY CRITICAL RESULT CALLED TO, READ BACK BY AND VERIFIED WITH: J LEGGE PHARMD 2006 06/02/19 A BROWNING    Culture (A)  Final    STAPHYLOCOCCUS SPECIES (COAGULASE NEGATIVE) THE SIGNIFICANCE OF ISOLATING THIS ORGANISM FROM A SINGLE SET OF BLOOD CULTURES WHEN MULTIPLE SETS ARE DRAWN IS UNCERTAIN. PLEASE NOTIFY THE MICROBIOLOGY DEPARTMENT WITHIN ONE WEEK IF SPECIATION AND SENSITIVITIES ARE REQUIRED. DIPHTHEROIDS(CORYNEBACTERIUM SPECIES) Standardized susceptibility testing for this organism is not available. Performed at [facility], [place] [address] [place], [place] [address]    Report Status 06/06/2019 FINAL  Final  Blood culture (routine x 2)     Status: None   Collection Time: 05/31/19 11:46 AM   Specimen: Left Antecubital; Blood  Result Value Ref Range Status   Specimen Description   Final    LEFT ANTECUBITAL Performed at [place] [address] [place], [place] [address]    Special Requests   Final    BOTTLES DRAWN AEROBIC AND ANAEROBIC Blood Culture adequate volume Performed at [place] [address], [place], [place] [address]    Culture   Final    NO GROWTH 5 DAYS Performed at [facility], [place] [address] [place], [place] [address]    Report Status 06/05/2019 FINAL  Final  MRSA PCR Screening     Status: None   Collection Time:  06/01/19 10:26 AM   Specimen: Nasal Mucosa; Nasopharyngeal  Result Value Ref Range Status   MRSA by PCR NEGATIVE NEGATIVE Final    Comment:        The GeneXpert MRSA Assay (FDA approved for NASAL specimens only), is one component of a comprehensive MRSA colonization surveillance program. It is not intended to diagnose MRSA infection nor to guide or monitor treatment for MRSA infections. Performed at [facility], [place] [address], [place],  [address]     Radiology Studies: No results found.   Amory Simonetti T. [place]  If 7PM-7AM, please contact night-coverage [email or web address] Password [email or web address] 06/08/2019, 1:44 PM

## 2019-06-09 LAB — GLUCOSE, CAPILLARY
Glucose-Capillary: 126 mg/dL — ABNORMAL HIGH (ref 70–99)
Glucose-Capillary: 237 mg/dL — ABNORMAL HIGH (ref 70–99)
Glucose-Capillary: 317 mg/dL — ABNORMAL HIGH (ref 70–99)
Glucose-Capillary: 250 mg/dL — ABNORMAL HIGH (ref 70–99)

## 2019-06-09 LAB — C-REACTIVE PROTEIN: CRP: 1.4 mg/dL — ABNORMAL HIGH (ref <1.0)

## 2019-06-09 LAB — BASIC METABOLIC PANEL
Anion gap: 10 (ref 5–15)
BUN: 56 mg/dL — ABNORMAL HIGH (ref 8–23)
CO2: 23 mmol/L (ref 22–32)
Calcium: 8.8 mg/dL — ABNORMAL LOW (ref 8.9–10.3)
Chloride: 105 mmol/L (ref 98–111)
Creatinine, Ser: 1.69 mg/dL — ABNORMAL HIGH (ref 0.44–1.00)
GFR calc Af Amer: 35 mL/min — ABNORMAL LOW (ref >60)
GFR calc non Af Amer: 30 mL/min — ABNORMAL LOW (ref >60)
Glucose, Bld: 206 mg/dL — ABNORMAL HIGH (ref 70–99)
Potassium: 5.2 mmol/L — ABNORMAL HIGH (ref 3.5–5.1)
Sodium: 138 mmol/L (ref 135–145)

## 2019-06-09 LAB — CBC
HCT: 26.5 % — ABNORMAL LOW (ref 36.0–46.0)
Hemoglobin: 8.2 g/dL — ABNORMAL LOW (ref 12.0–15.0)
MCH: 30 pg (ref 26.0–34.0)
MCHC: 30.9 g/dL (ref 30.0–36.0)
MCV: 97.1 fL (ref 80.0–100.0)
Platelets: 184 10*3/uL (ref 150–400)
RBC: 2.73 MIL/uL — ABNORMAL LOW (ref 3.87–5.11)
RDW: 14.3 % (ref 11.5–15.5)
WBC: 6.5 10*3/uL (ref 4.0–10.5)
nRBC: 0.3 % — ABNORMAL HIGH (ref 0.0–0.2)

## 2019-06-09 LAB — D-DIMER, QUANTITATIVE: D-Dimer, Quant: 3.12 ug/mL-FEU — ABNORMAL HIGH (ref 0.00–0.50)

## 2019-06-09 MED ORDER — SODIUM ZIRCONIUM CYCLOSILICATE 10 G PO PACK
10.0000 g | PACK | Freq: Once | ORAL | Status: AC
  Administered 2019-06-09: 10 g via ORAL
  Filled 2019-06-09: qty 1

## 2019-06-09 NOTE — Progress Notes (Signed)
PROGRESS NOTE  Andrea [name] [id number] DOB: October 19, 1945   PCP: Chester Holstein, MD  Patient is from: Home  DOA: 05/31/2019 LOS: 9  Brief Narrative / Interim history: 73 year old female with history of COPD, chronic RF on 2L PRN, DM-2, CKD-3, PE, CAD, HTN, and recent hospitalization for COVID-19 from 11/19-11/24 status post Decadron and remdesivir return to ED with subjective fever, SOB and productive cough.  In ED, HDS.  Saturation 90% on 3 L by [place].  CRP 14.8.  LA 1.0.  Procalcitonin less than 0.1.  D-dimer 2.5.  CXR with mild patchy opacities in the lingula and bilateral lower lobe suspicious for pneumonia. Patient was admitted for acute on chronic respiratory failure with hypoxia due to possible HAP and COPD exacerbation.  She was started on systemic steroid.  However, she had uncontrolled hyperglycemia up to 700.  Steroid stopped.  Briefly put on insulin drips and transferred to stepdown.  She came off insulin drip.  Transferred back to telemetry.  Restarted on Decadron with improvement in her breathing.  Subjective: No major events overnight of this morning.  She says she does not feel good this morning.  She says she is short of breath and coughing more.  She also reports right-sided chest pain with movement.  Reportedly desaturated to 84% on 2 L with ambulation this morning.  Currently saturating in low 90s on room air at rest. She is anxious about going home.  Objective: Vitals:   06/08/19 0923 06/08/19 1150 06/08/19 1938 06/09/19 0325  BP:  (!) 100/48 (!) 106/57 (!) 140/51  Pulse:  74 76 65  Resp:  19 15 18   Temp:  98 F (36.7 C) 98.1 F (36.7 C) 97.7 F (36.5 C)  TempSrc:  Oral  Oral  SpO2: 90% 90% 90% 96%  Weight:      Height:        Intake/Output Summary (Last 24 hours) at 06/09/2019 1237 Last data filed at 06/09/2019 1000 Gross per 24 hour  Intake 1036 ml  Output 2000 ml  Net -964 ml   Filed Weights   05/31/19 0847 06/01/19 0102  Weight: 68 kg 67.9 kg     Examination:  GENERAL: No apparent distress.  Nontoxic.  Sitting in bed HEENT: MMM.  Vision and hearing grossly intact.  NECK: Supple.  No apparent JVD.  RESP:  No IWOB.  Fair aeration with rhonchi bilaterally. CVS:  RRR. Heart sounds normal.  ABD/GI/GU: Bowel sounds present. Soft. Non tender.  MSK/EXT: Tenderness over the lateral aspect of right chest wall but no overlying the skin color change or swelling. SKIN: no apparent skin lesion or wound NEURO: Awake, alert and oriented appropriately.  No apparent focal neuro deficit. PSYCH: Somewhat anxious  Procedures:  None  Assessment & Plan: Acute on chronic respiratory failure with hypoxia due to COVID-19 infection versus HAP Acute exacerbation of COPD. Patient was hospitalized and completed remdesivir and Decadron course from 11/19-11/24.  She returned with subjective fever, shortness of breath and productive cough.  PCT and LA  negative.  BCx with coag negative staph in 1/2 bottles likely contaminant.  Received ceftriaxone and azithromycin on 12/5.  Vancomycin 12/6-12/8.  Cefepime 12/6-12/9. Briefly started on Decadron which was discontinued due to hyperglycemia requiring insulin drip.  Hypoglycemia resolved.  Restarted on Decadron on 12/11. -Respiratory status and inflammatory markers improved. -Continue Decadron 12/11>> -Continue inhalers, mucolytic's, antitussive and vitamins. -Incentive spirometry, OOB, PT/OT -Wean oxygen-goal saturation 88 to 92% given history of COPD Recent Labs  06/07/19 0307 06/08/19 0322 06/09/19 0346  DDIMER 3.86* 3.47* 3.12*  CRP 9.1* 4.1* 1.4*    Uncontrolled DM-2 with hyperglycemia: A1c 8.9% on 05/16/2019.  CBG elevated but improved. Recent Labs    06/08/19 1933 06/08/19 2238 06/09/19 0810  GLUCAP 216* 180* 126*  -Continue Levemir 20 units twice daily,  -Decrease NovoLog from 8-6 units AC  -Continue SSI-resistant -Continue Tradjenta -Continue statin and CBG monitoring.  HTN/CAD/CABG  in 07/2018: Stable. -Continue home aspirin, metoprolol, statin and Zetia. -Eliquis as below.  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  Not on nephrotoxic meds. -Cr 1.6-2.0 (baseline)> 1.76 (11/24)> 1.93 (admit)> 2.07> 1.49>> 1.69 -BUN 56 (11/24)> 48 (admit)>> 33>> 56-no signs of GI bleed. -Continue monitoring  Normocytic anemia: Likely anemia of chronic/renal disease.  H&H relatively stable. -Hgb 9-10 (baseline)>> 8.8 -Check anemia panel.  Paroxysmal A. fib: Not on anticoagulation.  Follow[facility] by [facility] Koren [facility] at [facility]. She has a high risk for blood clot with history of PE and Covid.  She also have elevated CHA2DS-2-VASc score.  Tolerated Eliquis well in the past.  -Started Eliquis-after risk-benefit discussion.  History of PE in 08/2018: No longer on anticoagulation.  Reportedly stopped Eliquis after 6 months. -Eliquis as above.  Right chest wall pain -As needed pain medications    DVT prophylaxis: Eliquis Code Status: Full code Family Communication: Updated patient's granddaughter over the phone. Disposition Plan: Remains inpatient due to hypoxemic respiratory failure.  Desaturated to 84% with ambulation on home 2 L.  Anxious about going home.  Consultants: None   Microbiology summarized: Blood culture with coag negative staph in 1 out of 2 bottles MRSA PCR negative. COVID-19 positive on 05/15/2019.  Sch Meds:  Scheduled Meds: . apixaban  5 mg Oral BID  . atorvastatin  40 mg Oral QHS  . dexamethasone (DECADRON) injection  6 mg Intravenous Daily  . ezetimibe  10 mg Oral Daily  . fluticasone  1 spray Each Nare Daily  . insulin aspart  0-20 Units Subcutaneous TID WC  . insulin aspart  0-5 Units Subcutaneous QHS  . insulin aspart  8 Units Subcutaneous TID WC  . insulin detemir  20 Units Subcutaneous QHS  . Ipratropium-Albuterol  1 puff Inhalation TID  . linagliptin  5 mg Oral Daily  . mouth rinse  15 mL Mouth Rinse BID  . metoprolol succinate  25 mg Oral Daily  .  mometasone-formoterol  2 puff Inhalation BID  . multivitamin with minerals  1 tablet Oral Daily  . pantoprazole  40 mg Oral Daily  . sodium zirconium cyclosilicate  10 g Oral Once  . vitamin C  500 mg Oral Daily  . zinc sulfate  220 mg Oral Daily   Continuous Infusions: . sodium chloride Stopped (06/04/19 1108)   PRN Meds:.sodium chloride, chlorpheniramine-HYDROcodone, dextrose, guaiFENesin-dextromethorphan  Antimicrobials: Anti-infectives (From admission, onward)   Start     Dose/Rate Route Frequency Ordered Stop   06/03/19 1000  vancomycin (VANCOCIN) IVPB 1000 mg/200 mL premix  Status:  Discontinued     1,000 mg 200 mL/hr over 60 Minutes Intravenous Every 48 hours 06/01/19 0949 06/02/19 1020   06/03/19 0000  vancomycin (VANCOCIN) IVPB 1000 mg/200 mL premix  Status:  Discontinued     1,000 mg 200 mL/hr over 60 Minutes Intravenous Every 36 hours 06/02/19 1020 06/03/19 1149   06/01/19 1100  ceFEPIme (MAXIPIME) 2 g in sodium chloride 0.9 % 100 mL IVPB  Status:  Discontinued     2 g 200 mL/hr over 30  Minutes Intravenous Daily 06/01/19 0944 06/04/19 1052   06/01/19 1100  vancomycin (VANCOCIN) 1,500 mg in sodium chloride 0.9 % 500 mL IVPB     1,500 mg 250 mL/hr over 120 Minutes Intravenous  Once 06/01/19 0948 06/01/19 2130   05/31/19 1145  cefTRIAXone (ROCEPHIN) 1 g in sodium chloride 0.9 % 100 mL IVPB     1 g 200 mL/hr over 30 Minutes Intravenous  Once 05/31/19 1143 05/31/19 1340   05/31/19 1145  azithromycin (ZITHROMAX) 500 mg in sodium chloride 0.9 % 250 mL IVPB     500 mg 250 mL/hr over 60 Minutes Intravenous  Once 05/31/19 1143 05/31/19 1512       I have personally reviewed the following labs and images: CBC: Recent Labs  Lab 06/03/19 0232 06/04/19 0235 06/05/19 0204 06/06/19 0030 06/08/19 0322 06/09/19 0346  WBC 8.2 9.2 7.9 7.9 7.0 6.5  NEUTROABS 7.0 6.9 6.1 6.0 5.9  --   HGB 8.8* 8.8* 8.2* 8.9* 8.2* 8.2*  HCT 27.5* 28.5* 26.0* 28.1* 27.2* 26.5*  MCV 94.2 94.4 94.2  94.9 96.8 97.1  PLT 79* 97* 97* 134* 199 184   BMP &GFR Recent Labs  Lab 06/03/19 0232 06/04/19 0235 06/05/19 0204 06/06/19 0030 06/08/19 0322 06/09/19 0346  NA 137 138 136 134* 136 138  K 3.9 3.8 3.9 4.6 5.2* 5.2*  CL 105 106 104 101 102 105  CO2 22 23 23 24 24 23   GLUCOSE 173* 72 69* 158* 326* 206*  BUN 52* 43* 33* 34* 50* 56*  CREATININE 1.49* 1.51* 1.57* 1.85* 1.63* 1.69*  CALCIUM 8.3* 8.0* 7.8* 8.1* 8.7* 8.8*  MG 2.5* 2.3 2.1  --   --   --   PHOS 2.2* 2.9 2.9  --   --   --    Estimated Creatinine Clearance: 27.2 mL/min (A) (by C-G formula based on SCr of 1.69 mg/dL (H)). Liver & Pancreas: Recent Labs  Lab 06/03/19 0232 06/04/19 0235 06/05/19 0204 06/06/19 0030 06/08/19 0322  AST 27 15 14* 17 27  ALT 29 22 19 22 31   ALKPHOS 55 59 57 63 74  BILITOT 0.8 0.7 0.8 0.8 0.4  PROT 5.9* 5.6* 5.4* 5.8* 6.3*  ALBUMIN 2.5* 2.1* 2.0* 2.4* 2.3*   No results for input(s): LIPASE, AMYLASE in the last 168 hours. No results for input(s): AMMONIA in the last 168 hours. Diabetic: No results for input(s): HGBA1C in the last 72 hours. Recent Labs  Lab 06/08/19 1148 06/08/19 1756 06/08/19 1933 06/08/19 2238 06/09/19 0810  GLUCAP 232* 197* 216* 180* 126*   Cardiac Enzymes: No results for input(s): CKTOTAL, CKMB, CKMBINDEX, TROPONINI in the last 168 hours. No results for input(s): PROBNP in the last 8760 hours. Coagulation Profile: No results for input(s): INR, PROTIME in the last 168 hours. Thyroid Function Tests: No results for input(s): TSH, T4TOTAL, FREET4, T3FREE, THYROIDAB in the last 72 hours. Lipid Profile: No results for input(s): CHOL, HDL, LDLCALC, TRIG, CHOLHDL, LDLDIRECT in the last 72 hours. Anemia Panel: No results for input(s): VITAMINB12, FOLATE, FERRITIN, TIBC, IRON, RETICCTPCT in the last 72 hours. Urine analysis:    Component Value Date/Time   COLORURINE YELLOW 05/16/2019 0158   APPEARANCEUR CLEAR 05/16/2019 0158   LABSPEC 1.010 05/16/2019 0158    PHURINE 6.0 05/16/2019 0158   GLUCOSEU NEGATIVE 05/16/2019 0158   HGBUR NEGATIVE 05/16/2019 0158   BILIRUBINUR NEGATIVE 05/16/2019 0158   KETONESUR NEGATIVE 05/16/2019 0158   PROTEINUR 30 (A) 05/16/2019 0158   NITRITE NEGATIVE 05/16/2019 0158  LEUKOCYTESUR TRACE (A) 05/16/2019 0158   Sepsis Labs: Invalid input(s): PROCALCITONIN, [place]  Microbiology: Recent Results (from the past 240 hour(s))  Blood culture (routine x 2)     Status: Abnormal   Collection Time: 05/31/19 11:41 AM   Specimen: Right Antecubital; Blood  Result Value Ref Range Status   Specimen Description   Final    RIGHT ANTECUBITAL Performed at [facility] [address], [place], [place] [address]    Special Requests   Final    BOTTLES DRAWN AEROBIC AND ANAEROBIC Blood Culture adequate volume Performed at [facility] [address], [place], [place] [address]    Culture  Setup Time   Final    GRAM POSITIVE COCCI IN CLUSTERS ANAEROBIC BOTTLE ONLY CRITICAL RESULT CALLED TO, READ BACK BY AND VERIFIED WITH: Shelda Jakes PHARMD, AT 1738 06/01/19 BY D. Rico Sheehan POSITIVE RODS AEROBIC BOTTLE ONLY CRITICAL RESULT CALLED TO, READ BACK BY AND VERIFIED WITH: J LEGGE PHARMD 2006 06/02/19 A BROWNING    Culture (A)  Final    STAPHYLOCOCCUS SPECIES (COAGULASE NEGATIVE) THE SIGNIFICANCE OF ISOLATING THIS ORGANISM FROM A SINGLE SET OF BLOOD CULTURES WHEN MULTIPLE SETS ARE DRAWN IS UNCERTAIN. PLEASE NOTIFY THE MICROBIOLOGY DEPARTMENT WITHIN ONE WEEK IF SPECIATION AND SENSITIVITIES ARE REQUIRED. DIPHTHEROIDS(CORYNEBACTERIUM SPECIES) Standardized susceptibility testing for this organism is not available. Performed at [facility], [place] [address], [place], [place] [address]    Report Status 06/06/2019 FINAL  Final  Blood culture (routine x 2)     Status: None   Collection Time: 05/31/19 11:46 AM   Specimen: Left Antecubital; Blood  Result Value Ref Range Status   Specimen  Description   Final    LEFT ANTECUBITAL Performed at [facility] [address], [place], [place] [address]    Special Requests   Final    BOTTLES DRAWN AEROBIC AND ANAEROBIC Blood Culture adequate volume Performed at [facility] [address], [place], [place] [address]    Culture   Final    NO GROWTH 5 DAYS Performed at [facility], [place] [address], [place], [place] [address]    Report Status 06/05/2019 FINAL  Final  MRSA PCR Screening     Status: None   Collection Time: 06/01/19 10:26 AM   Specimen: Nasal Mucosa; Nasopharyngeal  Result Value Ref Range Status   MRSA by PCR NEGATIVE NEGATIVE Final    Comment:        The GeneXpert MRSA Assay (FDA approved for NASAL specimens only), is one component of a comprehensive MRSA colonization surveillance program. It is not intended to diagnose MRSA infection nor to guide or monitor treatment for MRSA infections. Performed at [facility], [place] [address], [place], [place] [address]     Radiology Studies: No results found.   Camran Keady T. [place]  If 7PM-7AM, please contact night-coverage [email or web address] Password [facility] 06/09/2019, 12:37 PM

## 2019-06-09 NOTE — Care Management Important Message (Signed)
Important Message  Patient Detail IM Letter given to Gabriel Earing RN  Case Manager to present to the Patient Name: Andrea [name] MRN: [id number] Date of Birth: [birth year]   Medicare Important Message Given:  Yes     Kerin Salen 06/09/2019, 11:25 AM

## 2019-06-09 NOTE — TOC Progression Note (Signed)
Transition of Care [facility]) - Progression Note    Patient Details  Name: Andrea [name] MRN: [id number] Date of Birth: 1945-07-12  Transition of [facility]) CM/SW Contact  Purcell Mouton, RN Phone Number: 06/09/2019, 4:46 PM  Clinical Narrative:    Will continue follow for discharge needs.         Expected Discharge Plan and Services                                                 Social Determinants of Health (SDOH) Interventions    Readmission Risk Interventions Readmission Risk Prevention Plan 06/02/2019  Transportation Screening Complete  PCP or Specialist Appt within 3-5 Days Not Complete  Not Complete comments pt not ready to [place] or [place] Not Complete  HRI or Home Care Consult comments pending need  Social Work Consult for [place] Planning/Counseling Not Complete  SW consult not completed comments pending need  Palliative Care Screening Not Complete  Palliative Care Screening Not Complete Comments pending need  Medication Review (RN Transport planner) Referral to Pharmacy  Some recent data might be hidden

## 2019-06-10 DIAGNOSIS — J1289 Other viral pneumonia: Secondary | ICD-10-CM

## 2019-06-10 DIAGNOSIS — N1831 Chronic kidney disease, stage 3a: Secondary | ICD-10-CM

## 2019-06-10 DIAGNOSIS — R5381 Other malaise: Secondary | ICD-10-CM

## 2019-06-10 DIAGNOSIS — J441 Chronic obstructive pulmonary disease with (acute) exacerbation: Secondary | ICD-10-CM

## 2019-06-10 LAB — CBC
HCT: 27 % — ABNORMAL LOW (ref 36.0–46.0)
Hemoglobin: 8.3 g/dL — ABNORMAL LOW (ref 12.0–15.0)
MCH: 29.9 pg (ref 26.0–34.0)
MCHC: 30.7 g/dL (ref 30.0–36.0)
MCV: 97.1 fL (ref 80.0–100.0)
Platelets: 194 10*3/uL (ref 150–400)
RBC: 2.78 MIL/uL — ABNORMAL LOW (ref 3.87–5.11)
RDW: 14.3 % (ref 11.5–15.5)
WBC: 6.4 10*3/uL (ref 4.0–10.5)
nRBC: 0.5 % — ABNORMAL HIGH (ref 0.0–0.2)

## 2019-06-10 LAB — GLUCOSE, CAPILLARY
Glucose-Capillary: 63 mg/dL — ABNORMAL LOW (ref 70–99)
Glucose-Capillary: 69 mg/dL — ABNORMAL LOW (ref 70–99)
Glucose-Capillary: 126 mg/dL — ABNORMAL HIGH (ref 70–99)
Glucose-Capillary: 211 mg/dL — ABNORMAL HIGH (ref 70–99)

## 2019-06-10 LAB — BASIC METABOLIC PANEL
Anion gap: 8 (ref 5–15)
BUN: 55 mg/dL — ABNORMAL HIGH (ref 8–23)
CO2: 25 mmol/L (ref 22–32)
Calcium: 8.4 mg/dL — ABNORMAL LOW (ref 8.9–10.3)
Chloride: 104 mmol/L (ref 98–111)
Creatinine, Ser: 1.78 mg/dL — ABNORMAL HIGH (ref 0.44–1.00)
GFR calc Af Amer: 32 mL/min — ABNORMAL LOW (ref >60)
GFR calc non Af Amer: 28 mL/min — ABNORMAL LOW (ref >60)
Glucose, Bld: 118 mg/dL — ABNORMAL HIGH (ref 70–99)
Potassium: 4.8 mmol/L (ref 3.5–5.1)
Sodium: 137 mmol/L (ref 135–145)

## 2019-06-10 LAB — C-REACTIVE PROTEIN: CRP: 1 mg/dL — ABNORMAL HIGH (ref <1.0)

## 2019-06-10 LAB — D-DIMER, QUANTITATIVE: D-Dimer, Quant: 3.12 ug/mL-FEU — ABNORMAL HIGH (ref 0.00–0.50)

## 2019-06-10 MED ORDER — APIXABAN 5 MG PO TABS: 5.0000 mg | ORAL_TABLET | Freq: Two times a day (BID) | ORAL | 1 refills | Status: AC

## 2019-06-10 NOTE — Progress Notes (Signed)
Hypoglycemic Event  CBG: 63  Treatment: 8 oz juice/soda  Symptoms: None  Follow-up CBG: Time: 0841 CBG Result: 126  Possible Reasons for Event: Inadequate meal intake  Comments/MD notified:Pt reports she did not eat supper last night, nor did she have any snacks overnight. Given 8oz OJ as well as breakfast tray. MD Gonfa made aware.     Andrea [name]

## 2019-06-10 NOTE — TOC Progression Note (Signed)
Transition of Care [facility]) - Progression Note    Patient Details  Name: ALTHEIA SHADWELL MRN: [id number] Date of Birth: 07/14/1945  Transition of Care [facility]) CM/SW Contact  Purcell Mouton, RN Phone Number: 06/10/2019, 1:16 PM  Clinical Narrative:     Alvis Lemmings will follow pt at home with HHPT/OT.       Expected Discharge Plan and Services           Expected Discharge Date: 06/10/19                                     Social Determinants of Health (SDOH) Interventions    Readmission Risk Interventions Readmission Risk Prevention Plan 06/02/2019  Transportation Screening Complete  PCP or Specialist Appt within 3-5 Days Not Complete  Not Complete comments pt not ready to [place] or [place] Not Complete  HRI or Home Care Consult comments pending need  Social Work Consult for [place] Planning/Counseling Not Complete  SW consult not completed comments pending need  Palliative Care Screening Not Complete  Palliative Care Screening Not Complete Comments pending need  Medication Review (RN Transport planner) Referral to Pharmacy  Some recent data might be hidden

## 2019-06-10 NOTE — Discharge Summary (Signed)
Physician Discharge Summary  Andrea [name] [id number] DOB: 1945/09/06 DOA: 05/31/2019  PCP: Chester Holstein, MD  Admit date: 05/31/2019 Discharge date: 06/10/2019  Admitted From: Home Disposition: Home  Recommendations for Outpatient Follow-up:  1. Follow ups as below. 2. Please obtain CBC/BMP/Mag at follow up 3. Please follow up on the following pending results: None  Home Health: PT/OT Equipment/Devices: Patient has home oxygen  Discharge Condition: Stable CODE STATUS: Full code  Follow-up Information    [facility].   Specialty: Home Health Services Why: Alvis Lemmings will call you 48 hours after discharge. Feel free to call them.  Contact information: [place] [address] [phone]            Hospital Course: 73 year old female with history of COPD, chronic RF on 2L PRN, DM-2, CKD-3, PE, CAD, HTN, and recent hospitalization for COVID-19 from 11/19-11/24 status post Decadron and remdesivir return to ED with subjective fever, SOB and productive cough.  In ED, HDS.  Saturation 90% on 3 L by [place].  CRP 14.8.  LA 1.0.  Procalcitonin less than 0.1.  D-dimer 2.5.  CXR with mild patchy opacities in the lingula and bilateral lower lobe suspicious for pneumonia. Patient was admitted for acute on chronic respiratory failure with hypoxia due to possible HAP and COPD exacerbation.  She was started on systemic steroid.  However, she had uncontrolled hyperglycemia up to 700.  Steroid stopped.  Briefly put on insulin drips and transferred to stepdown.  She came off insulin drip.  Transferred back to telemetry.  Restarted on Decadron with improvement in her respiratory symptoms and inflammatory markers.  Patient was successfully weaned to room air at rest but required 2 L with ambulation and exertion which is at baseline.   On the day of discharge, she felt well and ready to go home.  Given comorbidities, oxygen requirements and  physical deconditioning from acute illness, home health PT/OT ordered on discharge.  Infection and return precautions discussed with patient and family as below.  Patient's granddaughter updated and on board with the plan above on 06/08/2019.  See individual problem list below for more hospital course.  Discharge Diagnoses:  Acute on chronic respiratory failure with hypoxia due to COVID-19 infection versus HAP Acute exacerbation of COPD. Patient was hospitalized and completed remdesivir and Decadron course from 11/19-11/24.  She returned with subjective fever, shortness of breath and productive cough.  PCT and LA  negative.  BCx with coag negative staph in 1/2 bottles likely contaminant.  Received ceftriaxone and azithromycin on 12/5.  Vancomycin 12/6-12/8.  Cefepime 12/6-12/9. Briefly started on Decadron which was discontinued due to hyperglycemia requiring insulin drip.  Hypoglycemia resolved.  Restarted on Decadron on 12/11 with significant improvement in her respiratory symptoms and inflammatory markers.  Discharged on home 2 L oxygen to be used as needed.  Encouraged to minimize oxygen use.  Infectious and return precautions as below. Recent Labs    06/08/19 0322 06/09/19 0346 06/10/19 0342  DDIMER 3.47* 3.12* 3.12*  CRP 4.1* 1.4* 1.0*   Uncontrolled DM-2 with hyperglycemia: A1c 8.9% on 05/16/2019.  CBG elevated but improved. Recent Labs    06/10/19 0815 06/10/19 0841 06/10/19 1218  GLUCAP 69* 126* 211*  -Discharged on home regimen as below.  HTN/CAD/CABG in 07/2018: Stable. -Discharged on home medications.  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  Not on nephrotoxic meds. -Cr 1.6-2.0 (baseline)> 1.76 (11/24)> 1.93 (admit)> 2.07> 1.49>> 1.78 -BUN 56 (11/24)> 48 (admit)>>  33>> 56-no signs of GI bleed. -Repeat BMP at follow-up.  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  Hgb 9-10 (baseline)>> 8.8>> 8.3 -Recheck CBC at follow-up.  Paroxysmal A. fib: Not on  anticoagulation.  Followed by Dr. Koren Bound at [facility] She has a high risk for blood clot with history of PE and Covid.  She also have elevated CHA2DS-2-VASc score.  Tolerated Eliquis well in the past.  -Started and discharged on Eliquis-after risk-benefit discussion.  History of PE in 08/2018: No longer on anticoagulation.  Reportedly stopped Eliquis after 6 months. -Eliquis as above.  Right chest wall pain: Resolved.  Physical deconditioning/generalized weakness -Home health PT/OT ordered.  Discharge Instructions  Discharge Instructions    (HEART FAILURE PATIENTS) Call MD:  Anytime you have any of the following symptoms: 1) 3 pound weight gain in 24 hours or 5 pounds in 1 week 2) shortness of breath, with or without a dry hacking cough 3) swelling in the hands, feet or stomach 4) if you have to sleep on extra pillows at night in order to breathe.   Complete by: As directed    Call MD for:  difficulty breathing, headache or visual disturbances   Complete by: As directed    Call MD for:  extreme fatigue   Complete by: As directed    Call MD for:  persistant dizziness or light-headedness   Complete by: As directed    Call MD for:  persistant nausea and vomiting   Complete by: As directed    Diet - low sodium heart healthy   Complete by: As directed    Diet Carb Modified   Complete by: As directed    Discharge instructions   Complete by: As directed    It has been a pleasure taking care of you! You were hospitalized and treated for COVID-19 infection. You are potentially infectious for the next 10-15 days. We recommend you isolate yourself and take the necessary precautions to prevent the virus from spreading.  Some of the steps to prevent the virus from spreading to others: Stay away from other and members of your household at least for 10-15 days. Let healthy household members care for children and pets, if possible. If you have to care for children or pets, wash your hands  often and wear a mask. If possible, stay in your own room, separate from others. Use a different bathroom.Make sure that all people in your household wash their hands well and often. Leave your house only to seek medical care. Do not use public transport. Do not travel while you are sick. Wash your hands often with soap and water for 20 seconds. If soap and water are not available, use alcohol-based hand sanitizer. Cough or sneeze into a tissue or your sleeve or elbow. Do not cough or sneeze into your hand or into the air. Wear a cloth face covering or face mask.  Return precautions: Get help or return to the hospital right away if: You have trouble breathing. You have pain or pressure in your chest. You have confusion. You have bluish lips and fingernails. You have difficulty waking from sleep. You have symptoms that get worse. These symptoms may represent a serious problem that is an emergency. Do not wait to see if the symptoms will go away. Get medical help right away. Call your local emergency services (911 in the [place] Do not drive yourself to the hospital. Let the emergency medical personnel know if you think you have COVID-19.  To protect yourself in the future:  Do not travel to areas where COVID-19 is a risk. The areas where COVID-19 is reported change often. To identify high-risk areas and travel restrictions, check the CDC travel website: FatFares.com.br If you live in, or must travel to, an area where COVID-19 is a risk, take precautions to avoid infection. Stay away from people who are sick. Wash your hands often with soap and water for 20 seconds. If soap and water are not available, use an alcohol-based hand sanitizer. Avoid touching your mouth, face, eyes, or nose. Avoid going out in public, follow guidance from your state and local health authorities. If you must go out in public, wear a cloth face covering or face mask. Disinfect objects and surfaces that are  frequently touched every day. This may include: Counters and tables. Doorknobs and light switches. Sinks and faucets. Electronics, such as phones, remote controls, keyboards, computers, and tablets.    Where to find more information Centers for Disease Control and Prevention: PurpleGadgets.be World Health Organization: https://www.castaneda.info/     Take steps to prevent the virus from spreading to others. Stay away from other and members of your household at least for 10 days. Let healthy household members care for children and pets, if possible. If you have to care for children or pets, wash your hands often and wear a mask. If possible, stay in your own room, separate from others. Use a different bathroom.Make sure that all people in your household wash their hands well and often. Leave your house only to seek medical care. Do not use public transport. Do not travel while you are sick. Wash your hands often with soap and water for 20 seconds. If soap and water are not available, use alcohol-based hand sanitizer. Cough or sneeze into a tissue or your sleeve or elbow. Do not cough or sneeze into your hand or into the air. Wear a cloth face covering or face mask.  Get help or return to the hospital right away if: You have trouble breathing. You have pain or pressure in your chest. You have confusion. You have bluish lips and fingernails. You have difficulty waking from sleep. You have symptoms that get worse. These symptoms may represent a serious problem that is an emergency. Do not wait to see if the symptoms will go away. Get medical help right away. Call your local emergency services (911 in the [place] Do not drive yourself to the hospital. Let the emergency medical personnel know if you think you have COVID-19.  To protect yourself in the future:  Do not travel to areas where COVID-19 is a risk. The areas where COVID-19 is reported change  often. To identify high-risk areas and travel restrictions, check the CDC travel website: FatFares.com.br If you live in, or must travel to, an area where COVID-19 is a risk, take precautions to avoid infection. Stay away from people who are sick. Wash your hands often with soap and water for 20 seconds. If soap and water are not available, use an alcohol-based hand sanitizer. Avoid touching your mouth, face, eyes, or nose. Avoid going out in public, follow guidance from your state and local health authorities. If you must go out in public, wear a cloth face covering or face mask. Disinfect objects and surfaces that are frequently touched every day. This may include: Counters and tables. Doorknobs and light switches. Sinks and faucets. Electronics, such as phones, remote controls, keyboards, computers, and tablets.    Where to find more  information Centers for Disease Control and Prevention: PurpleGadgets.be World Health Organization: https://www.castaneda.info/   Increase activity slowly   Complete by: As directed      Allergies as of 06/10/2019      Reactions   Metformin And Related Diarrhea   Sulfur Hives      Medication List    TAKE these medications   albuterol 108 (90 Base) MCG/ACT inhaler Commonly known as: VENTOLIN HFA Inhale 2 puffs into the lungs every 6 (six) hours as needed for wheezing or shortness of breath.   amLODipine 10 MG tablet Commonly known as: NORVASC Take 10 mg by mouth daily.   apixaban 5 MG Tabs tablet Commonly known as: ELIQUIS Take 1 tablet (5 mg total) by mouth 2 (two) times daily.   aspirin EC 81 MG tablet Take 81 mg by mouth daily.   atorvastatin 40 MG tablet Commonly known as: LIPITOR Take 40 mg by mouth at bedtime.   chlorpheniramine-HYDROcodone 10-8 MG/5ML Suer Commonly known as: TUSSIONEX Take 5 mLs by mouth every 12 (twelve) hours as needed for cough.   ezetimibe 10 MG  tablet Commonly known as: ZETIA Take 10 mg by mouth daily.   fluticasone 50 MCG/ACT nasal spray Commonly known as: FLONASE Place 1 spray into both nostrils daily.   furosemide 20 MG tablet Commonly known as: LASIX Take 40 mg by mouth daily.   glipiZIDE 10 MG tablet Commonly known as: GLUCOTROL Take 10 mg by mouth 2 (two) times daily before a meal.   Januvia 50 MG tablet Generic drug: sitaGLIPtin Take 50 mg by mouth daily.   metoprolol succinate 25 MG 24 hr tablet Commonly known as: TOPROL-XL Take 25 mg by mouth daily.   Multi-Vitamins Tabs Take 1 tablet by mouth daily.   nitroGLYCERIN 0.4 MG SL tablet Commonly known as: NITROSTAT Place 0.4 mg under the tongue every 5 (five) minutes as needed for chest pain.   omeprazole 20 MG capsule Commonly known as: PRILOSEC Take 1 capsule by mouth daily.   Spiriva HandiHaler 18 MCG inhalation capsule Generic drug: tiotropium Place 18 mcg into inhaler and inhale daily.   Symbicort 160-4.5 MCG/ACT inhaler Generic drug: budesonide-formoterol Inhale 2 puffs into the lungs 2 (two) times daily.   Vitamin D3 25 MCG (1000 UT) Caps Take 1 capsule by mouth daily.       Consultations:  None  Procedures/Studies:  2D Echo: None   DG Chest Portable 1 View  Result Date: 05/31/2019 CLINICAL DATA:  Shortness of breath, cough, headache. Diagnosed with COVID on 11/20. EXAM: PORTABLE CHEST 1 VIEW COMPARISON:  05/20/2019 FINDINGS: Mild patchy opacity in the lingula and bilateral lower lobes. No pleural effusion or pneumothorax. The heart is normal in size. Postsurgical changes related to prior CABG. Left vascular stent. Median sternotomy. Surgical clips overlying the right breast/lateral chest wall. IMPRESSION: Mild patchy opacities in the lingula and bilateral lower lobes, suspicious for pneumonia. Electronically Signed   By: Julian Hy M.D.   On: 05/31/2019 11:24   DG CHEST PORT 1 VIEW  Result Date: 05/20/2019 CLINICAL DATA:   Shortness of breath EXAM: PORTABLE CHEST 1 VIEW COMPARISON:  Two days ago FINDINGS: Normal heart size. CABG and coronary stenting. Hazy opacification at the peripheral left base, likely mild scarring when correlated with June 29 20 PA and lateral study. Postoperative right breast/axilla. IMPRESSION: No acute finding.  Pleural based scarring on the left. Electronically Signed   By: Monte Fantasia M.D.   On: 05/20/2019 07:29   DG CHEST  PORT 1 VIEW  Result Date: 05/18/2019 CLINICAL DATA:  Shortness of breath EXAM: PORTABLE CHEST 1 VIEW COMPARISON:  Yesterday FINDINGS: Normal heart size and mediastinal contours. Prior CABG and left subclavian region stenting. Mild interstitial coarsening similar to prior. There is no edema, consolidation, effusion, or pneumothorax. Postoperative right breast. Artifact from EKG leads. IMPRESSION: No evidence of active disease Electronically Signed   By: Monte Fantasia M.D.   On: 05/18/2019 07:05   DG CHEST PORT 1 VIEW  Result Date: 05/17/2019 CLINICAL DATA:  Shortness of breath. EXAM: PORTABLE CHEST 1 VIEW COMPARISON:  05/15/2019 FINDINGS: 0434 hours. The lungs are clear without focal pneumonia, edema, pneumothorax or pleural effusion. The cardiopericardial silhouette is within normal limits for size. The visualized bony structures of the thorax are intact. Telemetry leads overlie the chest. IMPRESSION: No active disease. Electronically Signed   By: Misty Stanley M.D.   On: 05/17/2019 07:30   DG Chest Port 1 View  Result Date: 05/15/2019 CLINICAL DATA:  Fever and shortness of breath. EXAM: PORTABLE CHEST 1 VIEW COMPARISON:  12/23/2018 FINDINGS: Post median sternotomy.The cardiomediastinal contours are normal. Atherosclerosis of the aortic arch. Vascular stent in the region of the left upper mediastinum. Coronary stent visualized. Chronic hyperinflation. Pulmonary vasculature is normal. No consolidation, pleural effusion, or pneumothorax. No acute osseous abnormalities  are seen. Surgical clips project over the right chest wall. IMPRESSION: 1. No acute abnormality. 2. Chronic hyperinflation. 3.  Aortic Atherosclerosis (ICD10-I70.0). Electronically Signed   By: Keith Rake M.D.   On: 05/15/2019 23:07      Discharge Exam: Vitals:   06/10/19 0628 06/10/19 1225  BP: (!) 118/49 125/66  Pulse: 72 71  Resp: 20 18  Temp: 98.2 F (36.8 C) 97.7 F (36.5 C)  SpO2: 93% 93%    GENERAL: No acute distress.  Appears well.  HEENT: MMM.  Vision and hearing grossly intact.  NECK: Supple.  No apparent JVD.  RESP:  No IWOB.  Fair aeration with rhonchi bilaterally CVS:  RRR. Heart sounds normal.  ABD/GI/GU: Bowel sounds present. Soft. Non tender.  MSK/EXT:  Moves extremities. No apparent deformity or edema.  SKIN: no apparent skin lesion or wound NEURO: Awake, alert and oriented appropriately.  No apparent focal neuro deficit. PSYCH: Calm. Normal affect.    The results of significant diagnostics from this hospitalization (including imaging, microbiology, ancillary and laboratory) are listed below for reference.     Microbiology: Recent Results (from the past 240 hour(s))  MRSA PCR Screening     Status: None   Collection Time: 06/01/19 10:26 AM   Specimen: Nasal Mucosa; Nasopharyngeal  Result Value Ref Range Status   MRSA by PCR NEGATIVE NEGATIVE Final    Comment:        The GeneXpert MRSA Assay (FDA approved for NASAL specimens only), is one component of a comprehensive MRSA colonization surveillance program. It is not intended to diagnose MRSA infection nor to guide or monitor treatment for MRSA infections. Performed at [facility], [place] [address], [place], [place] [address]      Labs: BNP (last 3 results) Recent Labs    05/31/19 1024 05/31/19 1424  BNP 395.7* 0000000*   Basic Metabolic Panel: Recent Labs  Lab 06/04/19 0235 06/05/19 0204 06/06/19 0030 06/08/19 0322 06/09/19 0346 06/10/19 0342  NA 138 136 134*  136 138 137  K 3.8 3.9 4.6 5.2* 5.2* 4.8  CL 106 104 101 102 105 104  CO2 23 23 24 24 23  25  GLUCOSE 72 69* 158* 326* 206* 118*  BUN 43* 33* 34* 50* 56* 55*  CREATININE 1.51* 1.57* 1.85* 1.63* 1.69* 1.78*  CALCIUM 8.0* 7.8* 8.1* 8.7* 8.8* 8.4*  MG 2.3 2.1  --   --   --   --   PHOS 2.9 2.9  --   --   --   --    Liver Function Tests: Recent Labs  Lab 06/04/19 0235 06/05/19 0204 06/06/19 0030 06/08/19 0322  AST 15 14* 17 27  ALT 22 19 22 31   ALKPHOS 59 57 63 74  BILITOT 0.7 0.8 0.8 0.4  PROT 5.6* 5.4* 5.8* 6.3*  ALBUMIN 2.1* 2.0* 2.4* 2.3*   No results for input(s): LIPASE, AMYLASE in the last 168 hours. No results for input(s): AMMONIA in the last 168 hours. CBC: Recent Labs  Lab 06/04/19 0235 06/05/19 0204 06/06/19 0030 06/08/19 0322 06/09/19 0346 06/10/19 0342  WBC 9.2 7.9 7.9 7.0 6.5 6.4  NEUTROABS 6.9 6.1 6.0 5.9  --   --   HGB 8.8* 8.2* 8.9* 8.2* 8.2* 8.3*  HCT 28.5* 26.0* 28.1* 27.2* 26.5* 27.0*  MCV 94.4 94.2 94.9 96.8 97.1 97.1  PLT 97* 97* 134* 199 184 194   Cardiac Enzymes: No results for input(s): CKTOTAL, CKMB, CKMBINDEX, TROPONINI in the last 168 hours. BNP: Invalid input(s): POCBNP CBG: Recent Labs  Lab 06/09/19 2007 06/10/19 0749 06/10/19 0815 06/10/19 0841 06/10/19 1218  GLUCAP 250* 63* 69* 126* 211*   D-Dimer Recent Labs    06/09/19 0346 06/10/19 0342  DDIMER 3.12* 3.12*   Hgb A1c No results for input(s): HGBA1C in the last 72 hours. Lipid Profile No results for input(s): CHOL, HDL, LDLCALC, TRIG, CHOLHDL, LDLDIRECT in the last 72 hours. Thyroid function studies No results for input(s): TSH, T4TOTAL, T3FREE, THYROIDAB in the last 72 hours.  Invalid input(s): FREET3 Anemia work up No results for input(s): VITAMINB12, FOLATE, FERRITIN, TIBC, IRON, RETICCTPCT in the last 72 hours. Urinalysis    Component Value Date/Time   COLORURINE YELLOW 05/16/2019 0158   APPEARANCEUR CLEAR 05/16/2019 0158   LABSPEC 1.010 05/16/2019 0158    PHURINE 6.0 05/16/2019 0158   GLUCOSEU NEGATIVE 05/16/2019 0158   HGBUR NEGATIVE 05/16/2019 0158   BILIRUBINUR NEGATIVE 05/16/2019 0158   KETONESUR NEGATIVE 05/16/2019 0158   PROTEINUR 30 (A) 05/16/2019 0158   NITRITE NEGATIVE 05/16/2019 0158   LEUKOCYTESUR TRACE (A) 05/16/2019 0158   Sepsis Labs Invalid input(s): PROCALCITONIN,  WBC,  LACTICIDVEN   Time coordinating discharge: 40 minutes  SIGNED:  Mercy Riding, MD  [facility] 06/10/2019, 2:16 PM  If 7PM-7AM, please contact night-coverage [email or web address] Password [email or web address]

## 2019-06-22 ENCOUNTER — Inpatient Hospital Stay
Admission: EM | Admit: 2019-06-22 | Discharge: 2019-06-29 | DRG: 177 | Disposition: A | Discharge status: Home Health | Payer: Medicare Other | Attending: Internal Medicine | Admitting: Internal Medicine

## 2019-06-22 ENCOUNTER — Emergency Department: Payer: Medicare Other

## 2019-06-22 ENCOUNTER — Other Ambulatory Visit: Payer: Self-pay

## 2019-06-22 DIAGNOSIS — E875 Hyperkalemia: Secondary | ICD-10-CM | POA: Diagnosis present

## 2019-06-22 DIAGNOSIS — Z79891 Long term (current) use of opiate analgesic: Secondary | ICD-10-CM

## 2019-06-22 DIAGNOSIS — Z8619 Personal history of other infectious and parasitic diseases: Secondary | ICD-10-CM

## 2019-06-22 DIAGNOSIS — I5043 Acute on chronic combined systolic (congestive) and diastolic (congestive) heart failure: Secondary | ICD-10-CM | POA: Diagnosis present

## 2019-06-22 DIAGNOSIS — Z7951 Long term (current) use of inhaled steroids: Secondary | ICD-10-CM

## 2019-06-22 DIAGNOSIS — E1165 Type 2 diabetes mellitus with hyperglycemia: Secondary | ICD-10-CM | POA: Diagnosis present

## 2019-06-22 DIAGNOSIS — J440 Chronic obstructive pulmonary disease with acute lower respiratory infection: Secondary | ICD-10-CM | POA: Diagnosis present

## 2019-06-22 DIAGNOSIS — J189 Pneumonia, unspecified organism: Secondary | ICD-10-CM

## 2019-06-22 DIAGNOSIS — Z7901 Long term (current) use of anticoagulants: Secondary | ICD-10-CM

## 2019-06-22 DIAGNOSIS — U071 COVID-19: Principal | ICD-10-CM

## 2019-06-22 DIAGNOSIS — E118 Type 2 diabetes mellitus with unspecified complications: Secondary | ICD-10-CM

## 2019-06-22 DIAGNOSIS — J449 Chronic obstructive pulmonary disease, unspecified: Secondary | ICD-10-CM

## 2019-06-22 DIAGNOSIS — I248 Other forms of acute ischemic heart disease: Secondary | ICD-10-CM | POA: Diagnosis present

## 2019-06-22 DIAGNOSIS — Z951 Presence of aortocoronary bypass graft: Secondary | ICD-10-CM

## 2019-06-22 DIAGNOSIS — E1122 Type 2 diabetes mellitus with diabetic chronic kidney disease: Secondary | ICD-10-CM | POA: Diagnosis present

## 2019-06-22 DIAGNOSIS — E119 Type 2 diabetes mellitus without complications: Secondary | ICD-10-CM

## 2019-06-22 DIAGNOSIS — T380X5A Adverse effect of glucocorticoids and synthetic analogues, initial encounter: Secondary | ICD-10-CM | POA: Diagnosis not present

## 2019-06-22 DIAGNOSIS — Z79899 Other long term (current) drug therapy: Secondary | ICD-10-CM

## 2019-06-22 DIAGNOSIS — Z90710 Acquired absence of both cervix and uterus: Secondary | ICD-10-CM

## 2019-06-22 DIAGNOSIS — R0902 Hypoxemia: Secondary | ICD-10-CM

## 2019-06-22 DIAGNOSIS — N183 Chronic kidney disease, stage 3 unspecified: Secondary | ICD-10-CM | POA: Diagnosis present

## 2019-06-22 DIAGNOSIS — Z8616 Personal history of COVID-19: Secondary | ICD-10-CM

## 2019-06-22 DIAGNOSIS — J441 Chronic obstructive pulmonary disease with (acute) exacerbation: Secondary | ICD-10-CM

## 2019-06-22 DIAGNOSIS — I509 Heart failure, unspecified: Secondary | ICD-10-CM

## 2019-06-22 DIAGNOSIS — R0602 Shortness of breath: Secondary | ICD-10-CM

## 2019-06-22 DIAGNOSIS — Z7982 Long term (current) use of aspirin: Secondary | ICD-10-CM

## 2019-06-22 DIAGNOSIS — J9621 Acute and chronic respiratory failure with hypoxia: Secondary | ICD-10-CM | POA: Diagnosis present

## 2019-06-22 DIAGNOSIS — J1282 Pneumonia due to coronavirus disease 2019: Secondary | ICD-10-CM | POA: Diagnosis present

## 2019-06-22 DIAGNOSIS — I2510 Atherosclerotic heart disease of native coronary artery without angina pectoris: Secondary | ICD-10-CM | POA: Diagnosis present

## 2019-06-22 DIAGNOSIS — I252 Old myocardial infarction: Secondary | ICD-10-CM

## 2019-06-22 DIAGNOSIS — Z7984 Long term (current) use of oral hypoglycemic drugs: Secondary | ICD-10-CM

## 2019-06-22 MED ORDER — ACETAMINOPHEN 500 MG PO TABS
1000.0000 mg | ORAL_TABLET | Freq: Once | ORAL | Status: AC
  Administered 2019-06-22: 1000 mg via ORAL
  Filled 2019-06-22: qty 2

## 2019-06-22 MED ORDER — IPRATROPIUM-ALBUTEROL 0.5-2.5 (3) MG/3ML IN SOLN
3.0000 mL | Freq: Once | RESPIRATORY_TRACT | Status: AC
  Administered 2019-06-23: 3 mL via RESPIRATORY_TRACT
  Filled 2019-06-22: qty 3

## 2019-06-22 MED ORDER — ACETAMINOPHEN 325 MG PO TABS: 650.0000 mg | ORAL_TABLET | Freq: Once | ORAL | Status: DC

## 2019-06-22 MED ORDER — METHYLPREDNISOLONE SODIUM SUCC 125 MG IJ SOLR
125.0000 mg | Freq: Once | INTRAMUSCULAR | Status: AC
  Administered 2019-06-23: 125 mg via INTRAVENOUS
  Filled 2019-06-22: qty 2

## 2019-06-22 NOTE — ED Provider Notes (Signed)
[facility] Emergency Department Provider Note   ____________________________________________   First MD Initiated Contact with Patient 06/22/19 2311     (approximate)  I have reviewed the triage vital signs and the nursing notes.   HISTORY  Chief Complaint Shortness of Breath    HPI Andrea [name] is a 73 y.o. female who presents to the ED from home with a chief complaint of shortness of breath.  Patient has a history of COPD usually on 2 L nasal cannula oxygen.  Tested Covid positive approximately 1 month ago and since then has had 3 hospitalizations for Covid and Covid pneumonia in the face of severe COPD.  Most recently admitted 12/21-12/24, diuresed with IV Lasix for CHF exacerbation, initially on empiric antibiotics which were discontinued and discharged with an increase in her nasal cannula oxygen.  On 4 L nasal cannula oxygen tonight family found her with saturations in the low 80%'s.  Patient reports increased dry cough for the past 2 days.  Also has fever, chest pain and wheezing.  Denies abdominal pain, nausea, vomiting or diarrhea.       Past Medical History:  Diagnosis Date  . COPD (chronic obstructive pulmonary disease) ([place])   . Diabetes mellitus without complication ([place])   . Heart attack ([place])   . Renal disorder     Patient Active Problem List   Diagnosis Date Noted  . Acute on chronic respiratory failure with hypoxia ([place]) 06/23/2019  . History of 2019 novel coronavirus disease (COVID-19) 06/23/2019  . Multifocal pneumonia 06/23/2019  . Acute CHF (congestive heart failure) ([place]) 06/23/2019  . COPD with chronic bronchitis ([place]) 06/23/2019  . Non-insulin dependent type 2 diabetes mellitus ([place]) 06/23/2019  . Chronic anticoagulation 06/23/2019  . HCAP (healthcare-associated pneumonia) 05/31/2019  . Pancytopenia ([place]) 05/17/2019  . Sepsis ([place]) 05/16/2019  . Fever 05/16/2019  . Pleural effusion with transudate 09/09/2018  .  Pulmonary embolism on right ([place]) 09/09/2018  . DOE (dyspnea on exertion) 08/08/2018  . 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 Acute bronchitis with chronic obstructive pulmonary disease (COPD) ([place]) 11/15/2017  . Pneumonia 09/22/2017  . H/O solitary pulmonary nodule 04/14/2014  . Chronic right hip pain 09/01/2013  . Peptic ulcer symptoms 04/04/2013  . GERD (gastroesophageal reflux disease) 10/22/2012  . Atherosclerotic heart disease of native coronary artery without angina pectoris 10/11/2012  . Malignant neoplasm of female breast ([place]) 02/16/2011  . H/O TIA (transient ischemic attack) and stroke 11/01/2009  . CKD (chronic kidney disease) stage 3, GFR 30-59 ml/min 02/23/2009  . Hypertension, benign 11/26/2008    Past Surgical History:  Procedure Laterality Date  . ABDOMINAL HYSTERECTOMY    . CARDIAC SURGERY     stents    Prior to Admission medications   Medication Sig Start Date End Date Taking? Authorizing Provider  albuterol (PROVENTIL HFA;VENTOLIN HFA) 108 (90 Base) MCG/ACT inhaler Inhale 2 puffs into the lungs every 6 (six) hours as needed for wheezing or shortness of breath. 11/18/17   Leslye Peer, Richard, MD  amLODipine (NORVASC) 10 MG tablet Take 10 mg by mouth daily.  09/12/18   [provider]  apixaban (ELIQUIS) 5 MG TABS tablet Take 1 tablet (5 mg total) by mouth 2 (two) times daily. 06/10/19   Mercy Riding, MD  aspirin EC 81 MG tablet Take 81 mg by mouth daily.    [provider]  atorvastatin (LIPITOR) 40 MG tablet Take 40 mg by mouth at bedtime. 10/09/17   [provider]  budesonide-formoterol (SYMBICORT) 160-4.5 MCG/ACT inhaler Inhale 2 puffs into the lungs 2 (two) times daily.  06/28/16   [provider]  chlorpheniramine-HYDROcodone (TUSSIONEX) 10-8 MG/5ML SUER Take 5 mLs by mouth every 12 (twelve) hours as needed for cough. 05/20/19   Raiford Noble Latif, DO  Cholecalciferol (VITAMIN D3) 1000 units CAPS Take 1 capsule by mouth daily.     [provider]  ezetimibe (ZETIA) 10 MG tablet Take 10 mg by mouth daily.  04/28/19 04/27/20  [provider]  fluticasone (FLONASE) 50 MCG/ACT nasal spray Place 1 spray into both nostrils daily.  01/03/17 05/31/19  [provider]  furosemide (LASIX) 20 MG tablet Take 40 mg by mouth daily.  12/04/18   [provider]  glipiZIDE (GLUCOTROL) 10 MG tablet Take 10 mg by mouth 2 (two) times daily before a meal.  03/19/17   [provider]  JANUVIA 50 MG tablet Take 50 mg by mouth daily.  11/20/18   [provider]  metoprolol succinate (TOPROL-XL) 25 MG 24 hr tablet Take 25 mg by mouth daily.  11/15/16   [provider]  Multiple Vitamin (MULTI-VITAMINS) TABS Take 1 tablet by mouth daily.    [provider]  nitroGLYCERIN (NITROSTAT) 0.4 MG SL tablet Place 0.4 mg under the tongue every 5 (five) minutes as needed for chest pain.  01/19/17   [provider]  omeprazole (PRILOSEC) 20 MG capsule Take 1 capsule by mouth daily. 07/04/17   [provider]  tiotropium (SPIRIVA HANDIHALER) 18 MCG inhalation capsule Place 18 mcg into inhaler and inhale daily.  08/01/17   [provider]    Allergies Metformin and related and Sulfur  No family history on file.  Social History Social History   Tobacco Use  . Smoking status: Never Smoker  . Smokeless tobacco: Never Used  Substance Use Topics  . Alcohol use: No  . Drug use: No    Review of Systems  Constitutional: Positive for fever/chills Eyes: No visual changes. ENT: No sore throat. Cardiovascular: Positive for chest pain. Respiratory: Positive for cough, wheezing and shortness of breath. Gastrointestinal: No abdominal pain.  No nausea, no vomiting.  No diarrhea.  No constipation. Genitourinary: Negative for dysuria. Musculoskeletal: Negative for back pain. Skin: Negative for rash. Neurological: Negative for headaches, focal weakness or  numbness.   ____________________________________________   PHYSICAL EXAM:  VITAL SIGNS: ED Triage Vitals  Enc Vitals Group     BP 06/22/19 2307 (!) 147/62     Pulse Rate 06/22/19 2307 96     Resp 06/22/19 2307 20     Temp 06/22/19 2307 (!) 100.5 F (38.1 C)     Temp Source 06/22/19 2307 Oral     SpO2 06/22/19 2304 100 %     Weight 06/22/19 2305 151 lb (68.5 kg)     Height 06/22/19 2305 5\' 2"  (1.575 m)     Head Circumference --      Peak Flow --      Pain Score 06/22/19 2305 0     Pain Loc --      Pain Edu? --      Excl. in [place]? --     Constitutional: Alert and oriented.  Ill appearing and in mild acute distress. Eyes: Conjunctivae are normal. PERRL. EOMI. Head: Atraumatic. Nose: No congestion/rhinnorhea. Mouth/Throat: Mucous membranes are moist.  Oropharynx non-erythematous. Neck: No stridor.   Cardiovascular: Normal rate, regular rhythm. Grossly normal heart sounds.  Good peripheral circulation. Respiratory: Increased respiratory effort.  No retractions. Lungs with scattered wheezing. Gastrointestinal: Soft and nontender. No distention. No abdominal bruits. No CVA tenderness. Musculoskeletal: No lower extremity tenderness nor edema.  No joint effusions. Neurologic:  Normal speech and language. No gross focal neurologic deficits are appreciated.  Skin:  Skin is warm, dry and intact. No rash noted. Psychiatric: Mood and affect are normal. Speech and behavior are normal.  ____________________________________________   LABS (all labs ordered are listed, but only abnormal results are displayed)  Labs Reviewed  CBC - Abnormal; Notable for the following components:      Result Value   RBC 3.04 (*)    Hemoglobin 8.7 (*)    HCT 28.4 (*)    RDW 15.8 (*)    All other components within normal limits  COMPREHENSIVE METABOLIC PANEL - Abnormal; Notable for the following components:   Chloride 95 (*)    Glucose, Bld 274 (*)    BUN 33 (*)    Creatinine, Ser 1.70 (*)     Calcium 8.5 (*)    Albumin 2.5 (*)    GFR calc non Af Amer 30 (*)    GFR calc Af Amer 34 (*)    All other components within normal limits  BRAIN NATRIURETIC PEPTIDE - Abnormal; Notable for the following components:   B Natriuretic Peptide 1,179.0 (*)    All other components within normal limits  TROPONIN I (HIGH SENSITIVITY) - Abnormal; Notable for the following components:   Troponin I (High Sensitivity) 86 (*)    All other components within normal limits  CULTURE, BLOOD (ROUTINE X 2)  CULTURE, BLOOD (ROUTINE X 2)  URINE CULTURE  RESPIRATORY PANEL BY PCR  EXPECTORATED SPUTUM ASSESSMENT W REFEX TO RESP CULTURE  LACTIC ACID, PLASMA  PROCALCITONIN  LACTIC ACID, PLASMA  URINALYSIS, COMPLETE (UACMP) WITH MICROSCOPIC  HIV ANTIBODY (ROUTINE TESTING W REFLEX)  INFLUENZA PANEL BY PCR (TYPE A & B)  ABO/RH  TROPONIN I (HIGH SENSITIVITY)   ____________________________________________  EKG  ED ECG REPORT I, Nuchem Grattan J, the attending physician, personally viewed and interpreted this ECG.   Date: 06/22/2019  EKG Time: 2311  Rate: 99  Rhythm: normal EKG, normal sinus rhythm  Axis: Normal  Intervals:none  ST&T Change: Nonspecific  ____________________________________________  RADIOLOGY  ED MD interpretation: Multifocal pneumonia; small bilateral pleural effusion  Official radiology report(s): DG Chest Portable 1 View  Result Date: 06/22/2019 CLINICAL DATA:  Shortness of breath EXAM: PORTABLE CHEST 1 VIEW COMPARISON:  May 31, 2019 FINDINGS: The cardiomediastinal silhouette is obscured. Overlying median sternotomy wires. Large area of patchy consolidation seen throughout the left mid and lower lung. There is increased interstitial markings seen in the left upper lung. Small amount of hazy airspace opacity at the right lung base. There is a blunting of the bilateral costophrenic angles, likely small bilateral pleural effusions. No acute osseous abnormality. IMPRESSION: Multifocal  patchy airspace consolidation seen predominantly throughout the left lung and right lung base. This is likely due to multifocal pneumonia. Small bilateral pleural effusions. Electronically Signed   By: Prudencio Pair M.D.   On: 06/22/2019 23:47    ____________________________________________   PROCEDURES  Procedure(s) performed (including Critical Care):  Procedures  CRITICAL CARE Performed by: Paulette Blanch   Total critical care time: 45 minutes  Critical care time was exclusive of separately billable procedures and treating other patients.  Critical care was necessary to treat or prevent imminent or life-threatening deterioration.  Critical care was time spent personally by me on the following activities: development of  treatment plan with patient and/or surrogate as well as nursing, discussions with consultants, evaluation of patient's response to treatment, examination of patient, obtaining history from patient or surrogate, ordering and performing treatments and interventions, ordering and review of laboratory studies, ordering and review of radiographic studies, pulse oximetry and re-evaluation of patient's condition.  ____________________________________________   INITIAL IMPRESSION / ASSESSMENT AND PLAN / ED COURSE  As part of my medical decision making, I reviewed the following data within the [facility] notes reviewed and incorporated, Labs reviewed, EKG interpreted, Old chart reviewed, Radiograph reviewed, Discussed with admitting physician and Notes from prior ED visits     Andrea [name] was evaluated in Emergency Department on 06/23/2019 for the symptoms described in the history of present illness. She was evaluated in the context of the global COVID-19 pandemic, which necessitated consideration that the patient might be at risk for infection with the SARS-CoV-2 virus that causes COVID-19. Institutional protocols and algorithms that pertain to the  evaluation of patients at risk for COVID-19 are in a state of rapid change based on information released by regulatory bodies including the CDC and federal and state organizations. These policies and algorithms were followed during the patient's care in the ED.    73 year old female with COPD, Covid positive in November with 3 hospitalizations since diagnosis presenting with increased cough and hypoxia on 4 L nasal cannula oxygen. Differential includes, but is not limited to, viral syndrome, bronchitis including COPD exacerbation, pneumonia, reactive airway disease including asthma, CHF including exacerbation with or without pulmonary/interstitial edema, pneumothorax, ACS, thoracic trauma, and pulmonary embolism.  We will obtain septic work-up, chest x-ray; Tylenol administered for fever.  Anticipate hospitalization.   Clinical Course as of Jun 22 216  Mon Jun 23, 2019  0039 Laboratory and imaging results noted.  Will check procalcitonin.  If elevated, will start IV antibiotics.   [JS]  0216 Procalcitonin 0.23; no IV antibiotics.   [JS]    Clinical Course User Index [JS] Paulette Blanch, MD     ____________________________________________   FINAL CLINICAL IMPRESSION(S) / ED DIAGNOSES  Final diagnoses:  Shortness of breath  Pneumonia due to COVID-19 virus  Hypoxia  COPD exacerbation ([place])  Acute on chronic congestive heart failure, unspecified heart failure type [facility])     ED Discharge Orders    None       Note:  This document was prepared using Dragon voice recognition software and may include unintentional dictation errors.   Paulette Blanch, MD 06/23/19 [phone]

## 2019-06-22 NOTE — ED Triage Notes (Signed)
Patient arrived from home for shortness of breath. Was covid positive back in November. Tonight patient was checked on by her family tonight and they report oxygen sats at home in 9s. Patient states she has had a continuous dry cough for a couple of days. Patient in no obvious distress at this time. Is on 4L oxygen all the time at home.

## 2019-06-22 NOTE — ED Notes (Addendum)
Patient states she has a low hemoglobin of 7.2 normally and was told she has congestive heart failure since having CABG x3 back in February. Patient's cardiologist is P. Miller in [facility].

## 2019-06-22 NOTE — ED Notes (Signed)
Patient without peripheral edema bilaterally

## 2019-06-23 ENCOUNTER — Inpatient Hospital Stay (HOSPITAL_COMMUNITY)
Admit: 2019-06-23 | Discharge: 2019-06-23 | Disposition: A | Discharge status: Home / Self Care | Payer: Medicare Other | Attending: Internal Medicine | Admitting: Internal Medicine

## 2019-06-23 DIAGNOSIS — Z7982 Long term (current) use of aspirin: Secondary | ICD-10-CM | POA: Diagnosis not present

## 2019-06-23 DIAGNOSIS — Z7901 Long term (current) use of anticoagulants: Secondary | ICD-10-CM

## 2019-06-23 DIAGNOSIS — J440 Chronic obstructive pulmonary disease with acute lower respiratory infection: Secondary | ICD-10-CM | POA: Diagnosis present

## 2019-06-23 DIAGNOSIS — Z7951 Long term (current) use of inhaled steroids: Secondary | ICD-10-CM | POA: Diagnosis not present

## 2019-06-23 DIAGNOSIS — U071 COVID-19: Secondary | ICD-10-CM | POA: Diagnosis present

## 2019-06-23 DIAGNOSIS — J449 Chronic obstructive pulmonary disease, unspecified: Secondary | ICD-10-CM

## 2019-06-23 DIAGNOSIS — I509 Heart failure, unspecified: Secondary | ICD-10-CM

## 2019-06-23 DIAGNOSIS — R0602 Shortness of breath: Secondary | ICD-10-CM | POA: Diagnosis present

## 2019-06-23 DIAGNOSIS — I252 Old myocardial infarction: Secondary | ICD-10-CM | POA: Diagnosis not present

## 2019-06-23 DIAGNOSIS — Z8616 Personal history of COVID-19: Secondary | ICD-10-CM

## 2019-06-23 DIAGNOSIS — I361 Nonrheumatic tricuspid (valve) insufficiency: Secondary | ICD-10-CM | POA: Diagnosis not present

## 2019-06-23 DIAGNOSIS — Z8619 Personal history of other infectious and parasitic diseases: Secondary | ICD-10-CM | POA: Diagnosis not present

## 2019-06-23 DIAGNOSIS — Z79891 Long term (current) use of opiate analgesic: Secondary | ICD-10-CM | POA: Diagnosis not present

## 2019-06-23 DIAGNOSIS — I248 Other forms of acute ischemic heart disease: Secondary | ICD-10-CM | POA: Diagnosis present

## 2019-06-23 DIAGNOSIS — E875 Hyperkalemia: Secondary | ICD-10-CM | POA: Diagnosis present

## 2019-06-23 DIAGNOSIS — E1165 Type 2 diabetes mellitus with hyperglycemia: Secondary | ICD-10-CM | POA: Diagnosis present

## 2019-06-23 DIAGNOSIS — J189 Pneumonia, unspecified organism: Secondary | ICD-10-CM

## 2019-06-23 DIAGNOSIS — E1122 Type 2 diabetes mellitus with diabetic chronic kidney disease: Secondary | ICD-10-CM | POA: Diagnosis present

## 2019-06-23 DIAGNOSIS — N183 Chronic kidney disease, stage 3 unspecified: Secondary | ICD-10-CM | POA: Diagnosis present

## 2019-06-23 DIAGNOSIS — I5043 Acute on chronic combined systolic (congestive) and diastolic (congestive) heart failure: Secondary | ICD-10-CM | POA: Diagnosis present

## 2019-06-23 DIAGNOSIS — Z951 Presence of aortocoronary bypass graft: Secondary | ICD-10-CM | POA: Diagnosis not present

## 2019-06-23 DIAGNOSIS — T380X5A Adverse effect of glucocorticoids and synthetic analogues, initial encounter: Secondary | ICD-10-CM | POA: Diagnosis not present

## 2019-06-23 DIAGNOSIS — J441 Chronic obstructive pulmonary disease with (acute) exacerbation: Secondary | ICD-10-CM | POA: Diagnosis present

## 2019-06-23 DIAGNOSIS — Z7984 Long term (current) use of oral hypoglycemic drugs: Secondary | ICD-10-CM | POA: Diagnosis not present

## 2019-06-23 DIAGNOSIS — I2510 Atherosclerotic heart disease of native coronary artery without angina pectoris: Secondary | ICD-10-CM | POA: Diagnosis present

## 2019-06-23 DIAGNOSIS — J9621 Acute and chronic respiratory failure with hypoxia: Secondary | ICD-10-CM

## 2019-06-23 DIAGNOSIS — Z90710 Acquired absence of both cervix and uterus: Secondary | ICD-10-CM | POA: Diagnosis not present

## 2019-06-23 DIAGNOSIS — Z79899 Other long term (current) drug therapy: Secondary | ICD-10-CM | POA: Diagnosis not present

## 2019-06-23 DIAGNOSIS — J1282 Pneumonia due to coronavirus disease 2019: Secondary | ICD-10-CM | POA: Diagnosis present

## 2019-06-23 DIAGNOSIS — E119 Type 2 diabetes mellitus without complications: Secondary | ICD-10-CM

## 2019-06-23 LAB — COMPREHENSIVE METABOLIC PANEL
ALT: 19 U/L (ref 0–44)
AST: 24 U/L (ref 15–41)
Albumin: 2.5 g/dL — ABNORMAL LOW (ref 3.5–5.0)
Alkaline Phosphatase: 77 U/L (ref 38–126)
Anion gap: 13 (ref 5–15)
BUN: 33 mg/dL — ABNORMAL HIGH (ref 8–23)
CO2: 27 mmol/L (ref 22–32)
Calcium: 8.5 mg/dL — ABNORMAL LOW (ref 8.9–10.3)
Chloride: 95 mmol/L — ABNORMAL LOW (ref 98–111)
Creatinine, Ser: 1.7 mg/dL — ABNORMAL HIGH (ref 0.44–1.00)
GFR calc Af Amer: 34 mL/min — ABNORMAL LOW (ref >60)
GFR calc non Af Amer: 30 mL/min — ABNORMAL LOW (ref >60)
Glucose, Bld: 274 mg/dL — ABNORMAL HIGH (ref 70–99)
Potassium: 4.3 mmol/L (ref 3.5–5.1)
Sodium: 135 mmol/L (ref 135–145)
Total Bilirubin: 0.7 mg/dL (ref 0.3–1.2)
Total Protein: 7.2 g/dL (ref 6.5–8.1)

## 2019-06-23 LAB — CBC
HCT: 28.4 % — ABNORMAL LOW (ref 36.0–46.0)
HCT: 25 % — ABNORMAL LOW (ref 36.0–46.0)
Hemoglobin: 8.7 g/dL — ABNORMAL LOW (ref 12.0–15.0)
Hemoglobin: 7.9 g/dL — ABNORMAL LOW (ref 12.0–15.0)
MCH: 28.6 pg (ref 26.0–34.0)
MCH: 28.2 pg (ref 26.0–34.0)
MCHC: 30.6 g/dL (ref 30.0–36.0)
MCHC: 31.6 g/dL (ref 30.0–36.0)
MCV: 93.4 fL (ref 80.0–100.0)
MCV: 89.3 fL (ref 80.0–100.0)
Platelets: 267 10*3/uL (ref 150–400)
Platelets: 238 10*3/uL (ref 150–400)
RBC: 3.04 MIL/uL — ABNORMAL LOW (ref 3.87–5.11)
RBC: 2.8 MIL/uL — ABNORMAL LOW (ref 3.87–5.11)
RDW: 15.8 % — ABNORMAL HIGH (ref 11.5–15.5)
RDW: 15.6 % — ABNORMAL HIGH (ref 11.5–15.5)
WBC: 9.9 10*3/uL (ref 4.0–10.5)
WBC: 6.6 10*3/uL (ref 4.0–10.5)
nRBC: 0 % (ref 0.0–0.2)
nRBC: 0 % (ref 0.0–0.2)

## 2019-06-23 LAB — INFLUENZA PANEL BY PCR (TYPE A & B)
Influenza A By PCR: NEGATIVE
Influenza B By PCR: NEGATIVE

## 2019-06-23 LAB — BASIC METABOLIC PANEL
Anion gap: 14 (ref 5–15)
BUN: 42 mg/dL — ABNORMAL HIGH (ref 8–23)
CO2: 27 mmol/L (ref 22–32)
Calcium: 8.5 mg/dL — ABNORMAL LOW (ref 8.9–10.3)
Chloride: 94 mmol/L — ABNORMAL LOW (ref 98–111)
Creatinine, Ser: 1.97 mg/dL — ABNORMAL HIGH (ref 0.44–1.00)
GFR calc Af Amer: 29 mL/min — ABNORMAL LOW (ref >60)
GFR calc non Af Amer: 25 mL/min — ABNORMAL LOW (ref >60)
Glucose, Bld: 522 mg/dL (ref 70–99)
Potassium: 3.8 mmol/L (ref 3.5–5.1)
Sodium: 135 mmol/L (ref 135–145)

## 2019-06-23 LAB — PROCALCITONIN: Procalcitonin: 0.23 ng/mL

## 2019-06-23 LAB — RESPIRATORY PANEL BY PCR

## 2019-06-23 LAB — SARS CORONAVIRUS 2 (TAT 6-24 HRS): SARS Coronavirus 2: POSITIVE — AB

## 2019-06-23 LAB — GLUCOSE, CAPILLARY
Glucose-Capillary: 443 mg/dL — ABNORMAL HIGH (ref 70–99)
Glucose-Capillary: 498 mg/dL — ABNORMAL HIGH (ref 70–99)
Glucose-Capillary: 189 mg/dL — ABNORMAL HIGH (ref 70–99)
Glucose-Capillary: 318 mg/dL — ABNORMAL HIGH (ref 70–99)

## 2019-06-23 LAB — HIV ANTIBODY (ROUTINE TESTING W REFLEX): HIV Screen 4th Generation wRfx: NONREACTIVE

## 2019-06-23 LAB — ECHOCARDIOGRAM COMPLETE
Height: 62 in
Weight: 2416 oz

## 2019-06-23 LAB — LACTIC ACID, PLASMA: Lactic Acid, Venous: 1.3 mmol/L (ref 0.5–1.9)

## 2019-06-23 LAB — TROPONIN I (HIGH SENSITIVITY)
Troponin I (High Sensitivity): 86 ng/L — ABNORMAL HIGH (ref <18)
Troponin I (High Sensitivity): 137 ng/L (ref <18)

## 2019-06-23 LAB — BRAIN NATRIURETIC PEPTIDE: B Natriuretic Peptide: 1179 pg/mL — ABNORMAL HIGH (ref 0.0–100.0)

## 2019-06-23 MED ORDER — SODIUM CHLORIDE 0.9% FLUSH: 3.0000 mL | INTRAVENOUS | Status: DC | PRN

## 2019-06-23 MED ORDER — FUROSEMIDE 10 MG/ML IJ SOLN
40.0000 mg | Freq: Two times a day (BID) | INTRAMUSCULAR | Status: DC
  Filled 2019-06-23: qty 4

## 2019-06-23 MED ORDER — INSULIN ASPART 100 UNIT/ML ~~LOC~~ SOLN
0.0000 [IU] | SUBCUTANEOUS | Status: DC
  Administered 2019-06-23: 20 [IU] via SUBCUTANEOUS
  Administered 2019-06-23: 18:00:00 4 [IU] via SUBCUTANEOUS
  Administered 2019-06-23: 20 [IU] via SUBCUTANEOUS
  Administered 2019-06-23: 15 [IU] via SUBCUTANEOUS
  Administered 2019-06-24: 21:00:00 3 [IU] via SUBCUTANEOUS
  Administered 2019-06-24 (×2): 7 [IU] via SUBCUTANEOUS
  Administered 2019-06-24: 13:00:00 11 [IU] via SUBCUTANEOUS
  Administered 2019-06-24: 5 [IU] via SUBCUTANEOUS
  Administered 2019-06-24: 4 [IU] via SUBCUTANEOUS
  Administered 2019-06-25: 02:00:00 3 [IU] via SUBCUTANEOUS
  Administered 2019-06-25: 18:00:00 7 [IU] via SUBCUTANEOUS
  Administered 2019-06-25: 09:00:00 4 [IU] via SUBCUTANEOUS
  Administered 2019-06-25: 22:00:00 11 [IU] via SUBCUTANEOUS
  Administered 2019-06-25: 8 [IU] via SUBCUTANEOUS
  Administered 2019-06-26: 4 [IU] via SUBCUTANEOUS
  Administered 2019-06-26: 22:00:00 7 [IU] via SUBCUTANEOUS
  Administered 2019-06-26 (×2): 3 [IU] via SUBCUTANEOUS
  Administered 2019-06-26: 05:00:00 4 [IU] via SUBCUTANEOUS
  Filled 2019-06-23 (×20): qty 1

## 2019-06-23 MED ORDER — SODIUM CHLORIDE 0.9 % IV SOLN
2.0000 g | INTRAVENOUS | Status: AC
  Administered 2019-06-23 – 2019-06-27 (×5): 2 g via INTRAVENOUS
  Filled 2019-06-23 (×2): qty 20
  Filled 2019-06-23: qty 2
  Filled 2019-06-23: qty 20
  Filled 2019-06-23: qty 2
  Filled 2019-06-23: qty 20

## 2019-06-23 MED ORDER — INSULIN GLARGINE 100 UNIT/ML ~~LOC~~ SOLN
10.0000 [IU] | Freq: Every day | SUBCUTANEOUS | Status: DC
  Administered 2019-06-23 – 2019-06-29 (×7): 10 [IU] via SUBCUTANEOUS
  Filled 2019-06-23 (×9): qty 0.1

## 2019-06-23 MED ORDER — SODIUM CHLORIDE 0.9 % IV SOLN
500.0000 mg | INTRAVENOUS | Status: AC
  Administered 2019-06-23 – 2019-06-26 (×5): 500 mg via INTRAVENOUS
  Filled 2019-06-23 (×5): qty 500

## 2019-06-23 MED ORDER — SODIUM CHLORIDE 0.9 % IV SOLN
250.0000 mL | INTRAVENOUS | Status: DC | PRN
  Administered 2019-06-23 – 2019-06-24 (×2): 250 mL via INTRAVENOUS

## 2019-06-23 MED ORDER — IPRATROPIUM-ALBUTEROL 0.5-2.5 (3) MG/3ML IN SOLN: 3.0000 mL | RESPIRATORY_TRACT | Status: DC | PRN

## 2019-06-23 MED ORDER — METOPROLOL SUCCINATE ER 25 MG PO TB24
25.0000 mg | ORAL_TABLET | Freq: Every day | ORAL | Status: DC
  Administered 2019-06-23 – 2019-06-29 (×7): 25 mg via ORAL
  Filled 2019-06-23 (×7): qty 1

## 2019-06-23 MED ORDER — FUROSEMIDE 10 MG/ML IJ SOLN
40.0000 mg | Freq: Every day | INTRAMUSCULAR | Status: DC
  Administered 2019-06-24 – 2019-06-29 (×6): 40 mg via INTRAVENOUS
  Filled 2019-06-23 (×6): qty 4

## 2019-06-23 MED ORDER — APIXABAN 5 MG PO TABS
5.0000 mg | ORAL_TABLET | Freq: Two times a day (BID) | ORAL | Status: DC
  Administered 2019-06-23 – 2019-06-29 (×13): 5 mg via ORAL
  Filled 2019-06-23 (×12): qty 1

## 2019-06-23 MED ORDER — FUROSEMIDE 10 MG/ML IJ SOLN
40.0000 mg | Freq: Once | INTRAMUSCULAR | Status: AC
  Administered 2019-06-23: 40 mg via INTRAVENOUS
  Filled 2019-06-23: qty 4

## 2019-06-23 MED ORDER — SODIUM CHLORIDE 0.9% FLUSH
3.0000 mL | Freq: Two times a day (BID) | INTRAVENOUS | Status: DC
  Administered 2019-06-23 – 2019-06-29 (×12): 3 mL via INTRAVENOUS

## 2019-06-23 MED ORDER — GUAIFENESIN-DM 100-10 MG/5ML PO SYRP
10.0000 mL | ORAL_SOLUTION | ORAL | Status: DC | PRN
  Filled 2019-06-23: qty 10

## 2019-06-23 MED ORDER — INSULIN ASPART 100 UNIT/ML ~~LOC~~ SOLN
5.0000 [IU] | Freq: Three times a day (TID) | SUBCUTANEOUS | Status: DC
  Administered 2019-06-23 – 2019-06-24 (×3): 5 [IU] via SUBCUTANEOUS
  Filled 2019-06-23 (×2): qty 1

## 2019-06-23 MED ORDER — INSULIN ASPART 100 UNIT/ML ~~LOC~~ SOLN
0.0000 [IU] | SUBCUTANEOUS | Status: DC
  Filled 2019-06-23: qty 1

## 2019-06-23 MED ORDER — METHYLPREDNISOLONE SODIUM SUCC 40 MG IJ SOLR: 40.0000 mg | Freq: Two times a day (BID) | INTRAMUSCULAR | Status: DC

## 2019-06-23 NOTE — ED Notes (Signed)
SpO2 100% on NRB.  Patient placed on 4l/ [place].  Continue to monitor

## 2019-06-23 NOTE — H&P (Signed)
History and Physical    Andrea [name] [id number] DOB: [birth year] DOA: 06/22/2019  PCP: Chester Holstein, MD  Patient coming from: home  I have personally briefly reviewed patient's old medical records in [place]  Chief Complaint: shortness of breath HPI: Andrea [name] is a 73 y.o. female with medical history significant for COPD, CAD status post CABG, CKD 3, congestive heart failure on apixaban for unclear iindication ,hospitalized twice in the past month with acute respiratory failure related to Covid pneumonia discharged most recently on 12/15 on O2 at 4 L, who returns to the emergency room after her children noticed that she was short of breath and desaturating to the mid 80s while on oxygen at 4 L.  She denies chest pain, orthopnea or PND and denies lower extremity edema. ED Course: On arrival in the emergency room she had a temperature of 100.5.  Blood pressure 147/62 with O2 sat 96% on 10 L via nonrebreather.  He was tachypneic with respirations of 21.  Pulse was 96.  EKG showed normal sinus rhythm, first troponin was 86 and BNP was 1179.  Chest x-ray showed multifocal patchy airspace consolidation with multifocal pneumonia.  Other blood work was mostly at baseline.  Patient was administered Solu-Medrol and duo nebs in the emergency room and hospitalist consulted for admission. Review of Systems: As per HPI otherwise 10 point review of systems negative.  Past Medical History:  Diagnosis Date  . COPD (chronic obstructive pulmonary disease) ([place])   . Diabetes mellitus without complication ([place])   . Heart attack ([place])   . Renal disorder     Past Surgical History:  Procedure Laterality Date  . ABDOMINAL HYSTERECTOMY    . CARDIAC SURGERY     stents     reports that she has never smoked. She has never used smokeless tobacco. She reports that she does not drink alcohol or use drugs.  Allergies  Allergen Reactions  . Metformin And Related Diarrhea  . Sulfur Hives      No family history on file.   Prior to Admission medications   Medication Sig Start Date End Date Taking? Authorizing Provider  albuterol (PROVENTIL HFA;VENTOLIN HFA) 108 (90 Base) MCG/ACT inhaler Inhale 2 puffs into the lungs every 6 (six) hours as needed for wheezing or shortness of breath. 11/18/17   Leslye Peer, Richard, MD  amLODipine (NORVASC) 10 MG tablet Take 10 mg by mouth daily.  09/12/18   [provider]  apixaban (ELIQUIS) 5 MG TABS tablet Take 1 tablet (5 mg total) by mouth 2 (two) times daily. 06/10/19   Mercy Riding, MD  aspirin EC 81 MG tablet Take 81 mg by mouth daily.    [provider]  atorvastatin (LIPITOR) 40 MG tablet Take 40 mg by mouth at bedtime. 10/09/17   [provider]  budesonide-formoterol (SYMBICORT) 160-4.5 MCG/ACT inhaler Inhale 2 puffs into the lungs 2 (two) times daily.  06/28/16   [provider]  chlorpheniramine-HYDROcodone (TUSSIONEX) 10-8 MG/5ML SUER Take 5 mLs by mouth every 12 (twelve) hours as needed for cough. 05/20/19   Raiford Noble Latif, DO  Cholecalciferol (VITAMIN D3) 1000 units CAPS Take 1 capsule by mouth daily.    [provider]  ezetimibe (ZETIA) 10 MG tablet Take 10 mg by mouth daily.  04/28/19 04/27/20  [provider]  fluticasone (FLONASE) 50 MCG/ACT nasal spray Place 1 spray into both nostrils daily.  01/03/17 05/31/19  [provider]  furosemide (LASIX) 20 MG tablet  Take 40 mg by mouth daily.  12/04/18   [provider]  glipiZIDE (GLUCOTROL) 10 MG tablet Take 10 mg by mouth 2 (two) times daily before a meal.  03/19/17   [provider]  JANUVIA 50 MG tablet Take 50 mg by mouth daily.  11/20/18   [provider]  metoprolol succinate (TOPROL-XL) 25 MG 24 hr tablet Take 25 mg by mouth daily.  11/15/16   [provider]  Multiple Vitamin (MULTI-VITAMINS) TABS Take 1 tablet by mouth daily.    [provider]  nitroGLYCERIN (NITROSTAT) 0.4  MG SL tablet Place 0.4 mg under the tongue every 5 (five) minutes as needed for chest pain.  01/19/17   [provider]  omeprazole (PRILOSEC) 20 MG capsule Take 1 capsule by mouth daily. 07/04/17   [provider]  tiotropium (SPIRIVA HANDIHALER) 18 MCG inhalation capsule Place 18 mcg into inhaler and inhale daily.  08/01/17   [provider]    Physical Exam: Vitals:   06/22/19 2305 06/22/19 2307 06/23/19 0100 06/23/19 0117  BP:  (!) 147/62 130/62   Pulse:  96 81 79  Resp:  20 14   Temp:  (!) 100.5 F (38.1 C)  98.7 F (37.1 C)  TempSrc:  Oral  Oral  SpO2:  96% 99%   Weight: 68.5 kg     Height: 5\' 2"  (1.575 m)        Vitals:   06/22/19 2305 06/22/19 2307 06/23/19 0100 06/23/19 0117  BP:  (!) 147/62 130/62   Pulse:  96 81 79  Resp:  20 14   Temp:  (!) 100.5 F (38.1 C)  98.7 F (37.1 C)  TempSrc:  Oral  Oral  SpO2:  96% 99%   Weight: 68.5 kg     Height: 5\' 2"  (1.575 m)       Constitutional: NAD, alert and oriented x 3.  She looks very debilitated, she is tachypneic but answer questions in full sentences Eyes: PERRL, lids and conjunctivae normal ENMT: Mucous membranes are moist.  Neck: normal, supple, no masses, no thyromegaly Respiratory: Rales bilaterally with increased respiratory effort Cardiovascular: Regular rate and rhythm, no murmurs / rubs / gallops. No extremity edema. 2+ pedal pulses. No carotid bruits.  Abdomen: no tenderness, no masses palpated. No hepatosplenomegaly. Bowel sounds positive.  Musculoskeletal: no clubbing / cyanosis. No joint deformity upper and lower extremities.  Skin: no rashes, lesions, ulcers.  Neurologic: No gross focal neurologic deficit. Psychiatric: Normal mood and affect.   Labs on Admission: I have personally reviewed following labs and imaging studies  CBC: Recent Labs  Lab 06/22/19 2324  WBC 9.9  HGB 8.7*  HCT 28.4*  MCV 93.4  PLT [id number]   Basic Metabolic Panel: Recent Labs  Lab 06/22/19 2324   NA 135  K 4.3  CL 95*  CO2 27  GLUCOSE 274*  BUN 33*  CREATININE 1.70*  CALCIUM 8.5*   GFR: Estimated Creatinine Clearance: 27.2 mL/min (A) (by C-G formula based on SCr of 1.7 mg/dL (H)). Liver Function Tests: Recent Labs  Lab 06/22/19 2324  AST 24  ALT 19  ALKPHOS 77  BILITOT 0.7  PROT 7.2  ALBUMIN 2.5*   No results for input(s): LIPASE, AMYLASE in the last 168 hours. No results for input(s): AMMONIA in the last 168 hours. Coagulation Profile: No results for input(s): INR, PROTIME in the last 168 hours. Cardiac Enzymes: No results for input(s): CKTOTAL, CKMB, CKMBINDEX, TROPONINI in the last 168 hours.  BNP (last 3 results) No results for input(s): PROBNP in the last 8760 hours. HbA1C: No results for input(s): HGBA1C in the last 72 hours. CBG: No results for input(s): GLUCAP in the last 168 hours. Lipid Profile: No results for input(s): CHOL, HDL, LDLCALC, TRIG, CHOLHDL, LDLDIRECT in the last 72 hours. Thyroid Function Tests: No results for input(s): TSH, T4TOTAL, FREET4, T3FREE, THYROIDAB in the last 72 hours. Anemia Panel: No results for input(s): VITAMINB12, FOLATE, FERRITIN, TIBC, IRON, RETICCTPCT in the last 72 hours. Urine analysis:    Component Value Date/Time   COLORURINE YELLOW 05/16/2019 0158   APPEARANCEUR CLEAR 05/16/2019 0158   LABSPEC 1.010 05/16/2019 0158   PHURINE 6.0 05/16/2019 0158   GLUCOSEU NEGATIVE 05/16/2019 0158   HGBUR NEGATIVE 05/16/2019 0158   BILIRUBINUR NEGATIVE 05/16/2019 0158   KETONESUR NEGATIVE 05/16/2019 0158   PROTEINUR 30 (A) 05/16/2019 0158   NITRITE NEGATIVE 05/16/2019 0158   LEUKOCYTESUR TRACE (A) 05/16/2019 0158    Radiological Exams on Admission: DG Chest Portable 1 View  Result Date: 06/22/2019 CLINICAL DATA:  Shortness of breath EXAM: PORTABLE CHEST 1 VIEW COMPARISON:  May 31, 2019 FINDINGS: The cardiomediastinal silhouette is obscured. Overlying median sternotomy wires. Large area of patchy consolidation seen  throughout the left mid and lower lung. There is increased interstitial markings seen in the left upper lung. Small amount of hazy airspace opacity at the right lung base. There is a blunting of the bilateral costophrenic angles, likely small bilateral pleural effusions. No acute osseous abnormality. IMPRESSION: Multifocal patchy airspace consolidation seen predominantly throughout the left lung and right lung base. This is likely due to multifocal pneumonia. Small bilateral pleural effusions. Electronically Signed   By: Prudencio Pair M.D.   On: 06/22/2019 23:47    EKG: Independently reviewed.  Assessment/Plan Principal Problem:   Acute on chronic respiratory failure with hypoxia (HCC)  -- Possibly multifactorial and related to worsening Covid related multifocal pneumonia, COPD exacerbation as well as exacerbation of congestive heart failure --Supplemental oxygen to keep sats over 90% --Treat each etiology    Acute on chronic CHF (congestive heart failure) (HCC) --IV Lasix, continue home beta-blocker.  Not currently on ACE inhibitor likely because of chronic kidney disease --BNP at 1179 up from the 300s during her hospitalization 2 weeks prior --Daily weights with salt and fluid restriction --Continue to cycle troponins --Cardiogram in the a.m. --Neurology consult if not improving  Elevated troponin in patient with history of CAD status post CABG --Patient does not complain of chest pain and EKG is nonacute --Likely secondary to demand ischemia    CKD (chronic kidney disease) stage 3, GFR 30-59 ml/min --At baseline  Persistent multifocal pneumonia with history of 2019 novel coronavirus disease (COVID-19) --Procalcitonin 0.23 so there might be some bacterial component --Rocephin and azithromycin --Follow sputum cultures, get respiratory panel  COPD with acute exacerbation --- Short acting bronchodilators scheduled and as needed --IV Solu-Medrol    Non-insulin dependent type 2  diabetes mellitus ([place]) --We will hold home oral hypoglycemics --Supplemental insulin coverage    Chronic anticoagulation --New apixaban      DVT prophylaxis: on full dose Code Status: full  Family Communication: none  Disposition Plan: Back to previous home environment Consults called: none     Athena Masse MD [facility]     06/23/2019, 3:14 AM

## 2019-06-23 NOTE — Progress Notes (Signed)
PROGRESS NOTE    Andrea [name]  [id number] DOB: 24-May-1946 DOA: 06/22/2019 PCP: Chester Holstein, MD       Assessment & Plan:   Principal Problem:   Acute on chronic respiratory failure with hypoxia [facility]) Active Problems:   CKD (chronic kidney disease) stage 3, GFR 30-59 ml/min   History of 2019 novel coronavirus disease (COVID-19)   Multifocal pneumonia   Acute CHF (congestive heart failure) (HCC)   COPD with chronic bronchitis (HCC)   Non-insulin dependent type 2 diabetes mellitus (HCC)   Chronic anticoagulation   Acute on chronic respiratory failure with hypoxia: Possibly multifactorial and related to worsening Covid related multifocal pneumonia, COPD exacerbation & CHF exacerbation. Repeat COVID19 ordered. Continue on supplemental oxygen and wean back to baseline as tolerated   Persistent multifocal pneumonia: w/ history of COVID19. Continue on Rocephin and azithromycin for possible bacterial component. Continue on bronchodilators. 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 Continue on IV Lasix, continue home dose of metoprolol. Monitor I/Os.  Not currently on ACE inhibitor likely because of chronic kidney disease.  Elevated troponin: in patient with history of CAD status post CABG. Likely secondary to demand ischemia but trending up so will consult cardio   CKDIIb: Cr is trending up. Avoid nephrotoxic meds. Will continue to monitor   COPD exacerbation: continue on abxs & bronchodilators. Continue on supplemental oxygen and wean back to baseline as tolerated   DM2: poorly controlled. Will continue to hold home oral hypoglycemics. 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 No need for a transfusion at this time. Will continue to monitor   Chronic anticoagulation: continue on eliquis  DVT prophylaxis:  eliquis Code Status: full  Family Communication: Disposition Plan:.   Consultants:   Cardio    Procedures:    Antimicrobials: azithromycin, rocephin    Subjective: Pt c/o shortness of breath   Objective: Vitals:   06/23/19 1045 06/23/19 1100 06/23/19 1115 06/23/19 1130  BP:  (!) 118/59  (!) 107/49  Pulse: 81 91 84 87  Resp: (!) 21 20 (!) 25 19  Temp:      TempSrc:      SpO2: 95% 96% 95% 95%  Weight:      Height:        Intake/Output Summary (Last 24 hours) at 06/23/2019 1433 Last data filed at 06/23/2019 0735 Gross per 24 hour  Intake 249.88 ml  Output --  Net 249.88 ml   Filed Weights   06/22/19 2305  Weight: 68.5 kg    Examination:  General exam: Appears calm but uncomfortable  Respiratory system: severely diminished breath sounds b/l. No wheezes Cardiovascular system: S1 & S2 normal. No rubs, gallops or clicks.  Gastrointestinal system: Abdomen is nondistended, soft and nontender.Normal bowel sounds heard. Central nervous system: Alert and oriented. Moves all 4 extremities Psychiatry: Judgement and insight appear normal. Flat mood and affect    Data Reviewed: I have personally reviewed following labs and imaging studies  CBC: Recent Labs  Lab 06/22/19 2324 06/23/19 1223  WBC 9.9 6.6  HGB 8.7* 7.9*  HCT 28.4* 25.0*  MCV 93.4 89.3  PLT 267 [id number]   Basic Metabolic Panel: Recent Labs  Lab 06/22/19 2324 06/23/19 1223  NA 135 135  K 4.3 3.8  CL 95* 94*  CO2 27 27  GLUCOSE 274* 522*  BUN  33* 42*  CREATININE 1.70* 1.97*  CALCIUM 8.5* 8.5*   GFR: Estimated Creatinine Clearance: 23.4 mL/min (A) (by C-G formula based on SCr of 1.97 mg/dL (H)). Liver Function Tests: Recent Labs  Lab 06/22/19 2324  AST 24  ALT 19  ALKPHOS 77  BILITOT 0.7  PROT 7.2  ALBUMIN 2.5*   No results for input(s): LIPASE, AMYLASE in the last 168 hours. No results for input(s): AMMONIA in the last 168 hours. Coagulation Profile: No results for input(s): INR,  PROTIME in the last 168 hours. Cardiac Enzymes: No results for input(s): CKTOTAL, CKMB, CKMBINDEX, TROPONINI in the last 168 hours. BNP (last 3 results) No results for input(s): PROBNP in the last 8760 hours. HbA1C: No results for input(s): HGBA1C in the last 72 hours. CBG: Recent Labs  Lab 06/23/19 0915 06/23/19 1201  GLUCAP 443* 498*   Lipid Profile: No results for input(s): CHOL, HDL, LDLCALC, TRIG, CHOLHDL, LDLDIRECT in the last 72 hours. Thyroid Function Tests: No results for input(s): TSH, T4TOTAL, FREET4, T3FREE, THYROIDAB in the last 72 hours. Anemia Panel: No results for input(s): VITAMINB12, FOLATE, FERRITIN, TIBC, IRON, RETICCTPCT in the last 72 hours. Sepsis Labs: Recent Labs  Lab 06/22/19 2313 06/22/19 2324  PROCALCITON  --  0.23  LATICACIDVEN 1.3  --     Recent Results (from the past 240 hour(s))  Blood culture (routine x 2)     Status: None (Preliminary result)   Collection Time: 06/22/19 11:12 PM   Specimen: Vein; Blood  Result Value Ref Range Status   Specimen Description BLOOD PERIPHERAL IV  Final   Special Requests   Final    BOTTLES DRAWN AEROBIC AND ANAEROBIC Blood Culture adequate volume   Culture   Final    NO GROWTH < 12 HOURS Performed at [facility], [address] [place], [place] [address]    Report Status PENDING  Incomplete  Blood culture (routine x 2)     Status: None (Preliminary result)   Collection Time: 06/22/19 11:24 PM   Specimen: Vein; Blood  Result Value Ref Range Status   Specimen Description BLOOD LEFT ARM  Final   Special Requests   Final    BOTTLES DRAWN AEROBIC AND ANAEROBIC Blood Culture adequate volume   Culture   Final    NO GROWTH < 12 HOURS Performed at [facility], [address], [place], [place] [address]    Report Status PENDING  Incomplete         Radiology Studies: DG Chest Portable 1 View  Result Date: 06/22/2019 CLINICAL DATA:  Shortness of breath EXAM: PORTABLE CHEST 1 VIEW  COMPARISON:  May 31, 2019 FINDINGS: The cardiomediastinal silhouette is obscured. Overlying median sternotomy wires. Large area of patchy consolidation seen throughout the left mid and lower lung. There is increased interstitial markings seen in the left upper lung. Small amount of hazy airspace opacity at the right lung base. There is a blunting of the bilateral costophrenic angles, likely small bilateral pleural effusions. No acute osseous abnormality. IMPRESSION: Multifocal patchy airspace consolidation seen predominantly throughout the left lung and right lung base. This is likely due to multifocal pneumonia. Small bilateral pleural effusions. Electronically Signed   By: Prudencio Pair M.D.   On: 06/22/2019 23:47   ECHOCARDIOGRAM COMPLETE  Result Date: 06/23/2019   ECHOCARDIOGRAM REPORT   Patient Name:   SHAHZODA STREMEL Date of Exam: 06/23/2019 Medical Rec #:  [id number]        Height:  62.0 in Accession #:    [id number]       Weight:       151.0 lb Date of Birth:  [birth year]        BSA:          1.70 m Patient Age:    73 years         BP:           138/59 mmHg Patient Gender: F                HR:           90 bpm. Exam Location:  [facility] Procedure: 2D Echo, Cardiac Doppler and Color Doppler Indications:     Abnormal ECG 794.31  History:         Patient has no prior history of Echocardiogram examinations.                  Prior CABG, COPD; Risk Factors:Diabetes. Heart attack.  Sonographer:     Sherrie Sport RDCS (AE) Referring Phys:  [id number] Athena Masse Diagnosing Phys: Nelva Bush MD  Sonographer Comments: CABG surgical scar. IMPRESSIONS  1. Left ventricular ejection fraction, by visual estimation, is 40 to 45%. The left ventricle has moderately decreased function. There is mildly increased left ventricular hypertrophy.  2. Moderate hypokinesis of the left ventricular, entire anteroseptal wall and anterior wall.  3. Elevated left atrial pressure.  4. Left ventricular diastolic parameters are  consistent with Grade II diastolic dysfunction (pseudonormalization).  5. The left ventricle demonstrates regional wall motion abnormalities.  6. Global right ventricle has normal systolic function.The right ventricular size is normal. No increase in right ventricular wall thickness.  7. Left atrial size was normal.  8. Right atrial size was normal.  9. Mild mitral annular calcification. 10. The mitral valve is degenerative. Trivial mitral valve regurgitation. No evidence of mitral stenosis. 11. The tricuspid valve is not well visualized. 12. The aortic valve was not well visualized. Aortic valve regurgitation is not visualized. Mild aortic valve sclerosis without stenosis. 13. The pulmonic valve was not well visualized. Pulmonic valve regurgitation is not visualized. 14. Moderately elevated pulmonary artery systolic pressure. 15. The inferior vena cava is normal in size with greater than 50% respiratory variability, suggesting right atrial pressure of 3 mmHg. FINDINGS  Left Ventricle: Left ventricular ejection fraction, by visual estimation, is 40 to 45%. The left ventricle has moderately decreased function. Moderate hypokinesis of the left ventricular, entire anteroseptal wall and anterior wall. The left ventricle demonstrates regional wall motion abnormalities. The left ventricular internal cavity size was the left ventricle is normal in size. There is mildly increased left ventricular hypertrophy. Left ventricular diastolic parameters are consistent with Grade II diastolic dysfunction (pseudonormalization). Elevated left atrial pressure. Right Ventricle: The right ventricular size is normal. No increase in right ventricular wall thickness. Global RV systolic function is has normal systolic function. The tricuspid regurgitant velocity is 3.12 m/s, and with an assumed right atrial pressure  of 3 mmHg, the estimated right ventricular systolic pressure is moderately elevated at 41.9 mmHg. Left Atrium: Left atrial  size was normal in size. Right Atrium: Right atrial size was normal in size Pericardium: There is no evidence of pericardial effusion. Mitral Valve: The mitral valve is degenerative in appearance. There is mild thickening of the mitral valve leaflet(s). Mild mitral annular calcification. Trivial mitral valve regurgitation. No evidence of mitral valve stenosis by observation. Tricuspid Valve: The tricuspid valve is not well visualized. Tricuspid  valve regurgitation is mild. Aortic Valve: The aortic valve was not well visualized. Aortic valve regurgitation is not visualized. Mild aortic valve sclerosis is present, with no evidence of aortic valve stenosis. Aortic valve mean gradient measures 4.0 mmHg. Aortic valve peak gradient measures 6.1 mmHg. Aortic valve area, by VTI measures 1.51 cm. Pulmonic Valve: The pulmonic valve was not well visualized. Pulmonic valve regurgitation is not visualized. Pulmonic regurgitation is not visualized. No evidence of pulmonic stenosis. Aorta: The aortic root is normal in size and structure. Pulmonary Artery: The pulmonary artery is not well seen. Venous: The inferior vena cava is normal in size with greater than 50% respiratory variability, suggesting right atrial pressure of 3 mmHg. IAS/Shunts: No atrial level shunt detected by color flow Doppler.  LEFT VENTRICLE PLAX 2D LVIDd:         4.87 cm       Diastology LVIDs:         3.80 cm       LV e' lateral:   8.38 cm/s LV PW:         1.10 cm       LV E/e' lateral: 10.8 LV IVS:        0.66 cm       LV e' medial:    2.83 cm/s LVOT diam:     2.00 cm       LV E/e' medial:  31.9 LV SV:         49 ml LV SV Index:   28.14 LVOT Area:     3.14 cm  LV Volumes (MOD) LV area d, A2C:    23.80 cm LV area d, A4C:    22.80 cm LV area s, A2C:    18.00 cm LV area s, A4C:    16.10 cm LV major d, A2C:   6.22 cm LV major d, A4C:   6.17 cm LV major s, A2C:   5.94 cm LV major s, A4C:   5.34 cm LV vol d, MOD A2C: 75.8 ml LV vol d, MOD A4C: 69.0 ml LV vol  s, MOD A2C: 47.7 ml LV vol s, MOD A4C: 41.2 ml LV SV MOD A2C:     28.1 ml LV SV MOD A4C:     69.0 ml LV SV MOD BP:      25.8 ml RIGHT VENTRICLE RV Basal diam:  2.55 cm RV S prime:     10.10 cm/s TAPSE (M-mode): 3.7 cm LEFT ATRIUM             Index       RIGHT ATRIUM           Index LA diam:        3.90 cm 2.30 cm/m  RA Area:     15.40 cm LA Vol (A2C):   40.3 ml 23.76 ml/m RA Volume:   38.40 ml  22.64 ml/m LA Vol (A4C):   36.6 ml 21.57 ml/m LA Biplane Vol: 41.6 ml 24.52 ml/m  AORTIC VALVE                   PULMONIC VALVE AV Area (Vmax):    1.41 cm    PV Vmax:        0.75 m/s AV Area (Vmean):   1.27 cm    PV Peak grad:   2.2 mmHg AV Area (VTI):     1.51 cm    RVOT Peak grad: 3 mmHg AV Vmax:  123.67 cm/s AV Vmean:          95.567 cm/s AV VTI:            0.298 m AV Peak Grad:      6.1 mmHg AV Mean Grad:      4.0 mmHg LVOT Vmax:         55.40 cm/s LVOT Vmean:        38.500 cm/s LVOT VTI:          0.143 m LVOT/AV VTI ratio: 0.48  AORTA Ao Root diam: 2.10 cm MITRAL VALVE                        TRICUSPID VALVE MV Area (PHT): 4.68 cm             TR Peak grad:   38.9 mmHg MV PHT:        46.98 msec           TR Vmax:        312.00 cm/s MV Decel Time: 162 msec MV E velocity: 90.20 cm/s 103 cm/s  SHUNTS MV A velocity: 60.10 cm/s 70.3 cm/s Systemic VTI:  0.14 m MV E/A ratio:  1.50       1.5       Systemic Diam: 2.00 cm  Nelva Bush MD Electronically signed by Nelva Bush MD Signature Date/Time: 06/23/2019/9:56:58 AM    Final         Scheduled Meds: . apixaban  5 mg Oral BID  . [START ON 06/24/2019] furosemide  40 mg Intravenous Daily  . insulin aspart  0-20 Units Subcutaneous Q4H  . insulin aspart  5 Units Subcutaneous TID WC  . insulin glargine  10 Units Subcutaneous Daily  . metoprolol succinate  25 mg Oral Daily  . sodium chloride flush  3 mL Intravenous Q12H   Continuous Infusions: . sodium chloride 250 mL (06/23/19 0332)  . azithromycin Stopped (06/23/19 0735)  . cefTRIAXone  (ROCEPHIN)  IV Stopped (06/23/19 0404)     LOS: 0 days    Time spent: 35 mins    Wyvonnia Dusky, MD [facility] Pager [phone]  If 7PM-7AM, please contact night-coverage [email or web address] Password [facility] 06/23/2019, 2:33 PM

## 2019-06-23 NOTE — ED Notes (Signed)
ED TO INPATIENT HANDOFF REPORT  ED Nurse Name and Phone #: Karena Addison [phone]  S Name/Age/Gender Andrea [name] 73 y.o. female Room/Bed: ED31A/ED31A  Code Status   Code Status: Full Code  Home/SNF/Other Home Patient oriented to: self, place, time and situation Is this baseline? Yes   Triage Complete: Triage complete  Chief Complaint Multifocal pneumonia [J18.9]  Triage Note Patient arrived from home for shortness of breath. Was covid positive back in November. Tonight patient was checked on by her family tonight and they report oxygen sats at home in 50s. Patient states she has had a continuous dry cough for a couple of days. Patient in no obvious distress at this time. Is on 4L oxygen all the time at home.     Allergies Allergies  Allergen Reactions  . Metformin And Related Diarrhea  . Sulfur Hives    Level of Care/Admitting Diagnosis ED Disposition    ED Disposition Condition Comment   Admit  Hospital Area: [facility] [[id number]]  Level of Care: Med-Surg [16]  Covid Evaluation: Confirmed COVID Positive  Diagnosis: Multifocal pneumonia [id number]  Admitting Physician: Athena Masse [id number]  Attending Physician: Athena Masse [id number]  Estimated length of stay: 5 - 7 days  Certification:: I certify this patient will need inpatient services for at least 2 midnights       B Medical/Surgery History Past Medical History:  Diagnosis Date  . COPD (chronic obstructive pulmonary disease) ([place])   . Diabetes mellitus without complication ([place])   . Heart attack ([place])   . Renal disorder    Past Surgical History:  Procedure Laterality Date  . ABDOMINAL HYSTERECTOMY    . CARDIAC SURGERY     stents     A IV Location/Drains/Wounds Patient Lines/Drains/Airways Status   Active Line/Drains/Airways    Name:   Placement date:   Placement time:   Site:   Days:   Peripheral IV 06/23/19 Right Antecubital   06/23/19    0120    Antecubital   less than 1           Intake/Output Last 24 hours  Intake/Output Summary (Last 24 hours) at 06/23/2019 1908 Last data filed at 06/23/2019 [id number] Gross per 24 hour  Intake 249.88 ml  Output -  Net 249.88 ml    Labs/Imaging Results for orders placed or performed during the hospital encounter of 06/22/19 (from the past 48 hour(s))  Blood culture (routine x 2)     Status: None (Preliminary result)   Collection Time: 06/22/19 11:12 PM   Specimen: Vein; Blood  Result Value Ref Range   Specimen Description BLOOD PERIPHERAL IV    Special Requests      BOTTLES DRAWN AEROBIC AND ANAEROBIC Blood Culture adequate volume   Culture      NO GROWTH < 12 HOURS Performed at [facility], [address] [place], [place] [address]    Report Status PENDING   Lactic acid, plasma     Status: None   Collection Time: 06/22/19 11:13 PM  Result Value Ref Range   Lactic Acid, Venous 1.3 0.5 - 1.9 mmol/L    Comment: Performed at [facility], [address] [place], [place] [address]  Brain natriuretic peptide     Status: Abnormal   Collection Time: 06/22/19 11:13 PM  Result Value Ref Range   B Natriuretic Peptide 1,179.0 (H) 0.0 - 100.0 pg/mL    Comment: Performed at [facility], [place], [place]  [address]  CBC     Status: Abnormal   Collection Time: 06/22/19 11:24 PM  Result Value Ref Range   WBC 9.9 4.0 - 10.5 K/uL   RBC 3.04 (L) 3.87 - 5.11 MIL/uL   Hemoglobin 8.7 (L) 12.0 - 15.0 g/dL   HCT 28.4 (L) 36.0 - 46.0 %   MCV 93.4 80.0 - 100.0 fL   MCH 28.6 26.0 - 34.0 pg   MCHC 30.6 30.0 - 36.0 g/dL   RDW 15.8 (H) 11.5 - 15.5 %   Platelets 267 150 - 400 K/uL   nRBC 0.0 0.0 - 0.2 %    Comment: Performed at [facility], [address] [place], [place] [address]  Comprehensive metabolic panel     Status: Abnormal   Collection Time: 06/22/19 11:24 PM  Result Value Ref Range   Sodium 135 135 - 145 mmol/L   Potassium 4.3 3.5 - 5.1 mmol/L    Comment:  HEMOLYSIS AT THIS LEVEL MAY AFFECT RESULT   Chloride 95 (L) 98 - 111 mmol/L   CO2 27 22 - 32 mmol/L   Glucose, Bld 274 (H) 70 - 99 mg/dL   BUN 33 (H) 8 - 23 mg/dL   Creatinine, Ser 1.70 (H) 0.44 - 1.00 mg/dL   Calcium 8.5 (L) 8.9 - 10.3 mg/dL   Total Protein 7.2 6.5 - 8.1 g/dL   Albumin 2.5 (L) 3.5 - 5.0 g/dL   AST 24 15 - 41 U/L   ALT 19 0 - 44 U/L   Alkaline Phosphatase 77 38 - 126 U/L   Total Bilirubin 0.7 0.3 - 1.2 mg/dL   GFR calc non Af Amer 30 (L) >60 mL/min   GFR calc Af Amer 34 (L) >60 mL/min   Anion gap 13 5 - 15    Comment: Performed at [facility], [address] [place], [place] [address]  Troponin I (High Sensitivity)     Status: Abnormal   Collection Time: 06/22/19 11:24 PM  Result Value Ref Range   Troponin I (High Sensitivity) 86 (H) <18 ng/L    Comment: (NOTE) Elevated high sensitivity troponin I (hsTnI) values and significant  changes across serial measurements may suggest ACS but many other  chronic and acute conditions are known to elevate hsTnI results.  Refer to the "Links" section for chest pain algorithms and additional  guidance. Performed at [facility], [address] [place], [place] [address]   Blood culture (routine x 2)     Status: None (Preliminary result)   Collection Time: 06/22/19 11:24 PM   Specimen: Vein; Blood  Result Value Ref Range   Specimen Description BLOOD LEFT ARM    Special Requests      BOTTLES DRAWN AEROBIC AND ANAEROBIC Blood Culture adequate volume   Culture      NO GROWTH < 12 HOURS Performed at [facility], [address] [place], [place] [address]    Report Status PENDING   Procalcitonin - Baseline     Status: None   Collection Time: 06/22/19 11:24 PM  Result Value Ref Range   Procalcitonin 0.23 ng/mL    Comment:        Interpretation: PCT (Procalcitonin) <= 0.5 ng/mL: Systemic infection (sepsis) is not likely. Local bacterial infection is possible. (NOTE)       Sepsis PCT  Algorithm           Lower Respiratory Tract  Infection PCT Algorithm    ----------------------------     ----------------------------         PCT < 0.25 ng/mL                PCT < 0.10 ng/mL         Strongly encourage             Strongly discourage   discontinuation of antibiotics    initiation of antibiotics    ----------------------------     -----------------------------       PCT 0.25 - 0.50 ng/mL            PCT 0.10 - 0.25 ng/mL               OR       >80% decrease in PCT            Discourage initiation of                                            antibiotics      Encourage discontinuation           of antibiotics    ----------------------------     -----------------------------         PCT >= 0.50 ng/mL              PCT 0.26 - 0.50 ng/mL               AND        <80% decrease in PCT             Encourage initiation of                                             antibiotics       Encourage continuation           of antibiotics    ----------------------------     -----------------------------        PCT >= 0.50 ng/mL                  PCT > 0.50 ng/mL               AND         increase in PCT                  Strongly encourage                                      initiation of antibiotics    Strongly encourage escalation           of antibiotics                                     -----------------------------                                           PCT <= 0.25 ng/mL  OR                                        > 80% decrease in PCT                                     Discontinue / Do not initiate                                             antibiotics Performed at [facility], [address] [place], [place] [address]   ABO/Rh     Status: None   Collection Time: 06/22/19 11:24 PM  Result Value Ref Range   ABO/RH(D)      A POS Performed at [facility], [address] [place], [place] [address]   Troponin I (High Sensitivity)     Status: Abnormal   Collection Time: 06/23/19  7:46 AM  Result Value Ref Range   Troponin I (High Sensitivity) 137 (HH) <18 ng/L    Comment: CRITICAL RESULT CALLED TO, READ BACK BY AND VERIFIED WITH JANE RYAN 06/23/2019 0859 [facility] (NOTE) Elevated high sensitivity troponin I (hsTnI) values and significant  changes across serial measurements may suggest ACS but many other  chronic and acute conditions are known to elevate hsTnI results.  Refer to the "Links" section for chest pain algorithms and additional  guidance. Performed at [facility], [address] [place], [place] [address]   HIV Antibody (routine testing w rflx)     Status: None   Collection Time: 06/23/19  7:46 AM  Result Value Ref Range   HIV Screen 4th Generation wRfx NON REACTIVE NON REACTIVE    Comment: Performed at [facility] [address] [place], [place] [address]  Respiratory Panel by PCR     Status: None   Collection Time: 06/23/19  7:46 AM   Specimen: Nasopharyngeal Swab; Respiratory  Result Value Ref Range   Adenovirus NOT DETECTED NOT DETECTED   Coronavirus 229E NOT DETECTED NOT DETECTED    Comment: (NOTE) The Coronavirus on the Respiratory Panel, DOES NOT test for the novel  Coronavirus (2019 nCoV)    Coronavirus HKU1 NOT DETECTED NOT DETECTED   Coronavirus NL63 NOT DETECTED NOT DETECTED   Coronavirus OC43 NOT DETECTED NOT DETECTED   Metapneumovirus NOT DETECTED NOT DETECTED   Rhinovirus / Enterovirus NOT DETECTED NOT DETECTED   Influenza A NOT DETECTED NOT DETECTED   Influenza B NOT DETECTED NOT DETECTED   Parainfluenza Virus 1 NOT DETECTED NOT DETECTED   Parainfluenza Virus 2 NOT DETECTED NOT DETECTED   Parainfluenza Virus 3 NOT DETECTED NOT DETECTED   Parainfluenza Virus 4 NOT DETECTED NOT DETECTED   Respiratory Syncytial Virus NOT DETECTED NOT DETECTED   Bordetella pertussis NOT DETECTED NOT DETECTED    Chlamydophila pneumoniae NOT DETECTED NOT DETECTED   Mycoplasma pneumoniae NOT DETECTED NOT DETECTED    Comment: Performed at [facility], [place] [address], [place],  [address]  Influenza panel by PCR (type A & B)     Status: None   Collection Time: 06/23/19  7:46 AM  Result Value Ref Range   Influenza A By PCR NEGATIVE NEGATIVE  Influenza B By PCR NEGATIVE NEGATIVE    Comment: (NOTE) The Xpert Xpress Flu assay is intended as an aid in the diagnosis of  influenza and should not be used as a sole basis for treatment.  This  assay is FDA approved for nasopharyngeal swab specimens only. Nasal  washings and aspirates are unacceptable for Xpert Xpress Flu testing. Performed at [facility], [address] [place], [place] [address]   Glucose, capillary     Status: Abnormal   Collection Time: 06/23/19  9:15 AM  Result Value Ref Range   Glucose-Capillary 443 (H) 70 - 99 mg/dL  Glucose, capillary     Status: Abnormal   Collection Time: 06/23/19 12:01 PM  Result Value Ref Range   Glucose-Capillary 498 (H) 70 - 99 mg/dL  CBC     Status: Abnormal   Collection Time: 06/23/19 12:23 PM  Result Value Ref Range   WBC 6.6 4.0 - 10.5 K/uL   RBC 2.80 (L) 3.87 - 5.11 MIL/uL   Hemoglobin 7.9 (L) 12.0 - 15.0 g/dL   HCT 25.0 (L) 36.0 - 46.0 %   MCV 89.3 80.0 - 100.0 fL   MCH 28.2 26.0 - 34.0 pg   MCHC 31.6 30.0 - 36.0 g/dL   RDW 15.6 (H) 11.5 - 15.5 %   Platelets 238 150 - 400 K/uL   nRBC 0.0 0.0 - 0.2 %    Comment: Performed at [facility], [address] [place], [place] [id number]  Basic metabolic panel     Status: Abnormal   Collection Time: 06/23/19 12:23 PM  Result Value Ref Range   Sodium 135 135 - 145 mmol/L   Potassium 3.8 3.5 - 5.1 mmol/L   Chloride 94 (L) 98 - 111 mmol/L   CO2 27 22 - 32 mmol/L   Glucose, Bld 522 (HH) 70 - 99 mg/dL    Comment: CRITICAL RESULT CALLED TO, READ BACK BY AND VERIFIED WITH  JANE RYAN AT 1256 06/23/2019 SDR    BUN 42  (H) 8 - 23 mg/dL   Creatinine, Ser 1.97 (H) 0.44 - 1.00 mg/dL   Calcium 8.5 (L) 8.9 - 10.3 mg/dL   GFR calc non Af Amer 25 (L) >60 mL/min   GFR calc Af Amer 29 (L) >60 mL/min   Anion gap 14 5 - 15    Comment: Performed at [facility], [address] [place], [place] [address]  Glucose, capillary     Status: Abnormal   Collection Time: 06/23/19  5:28 PM  Result Value Ref Range   Glucose-Capillary 189 (H) 70 - 99 mg/dL   DG Chest Portable 1 View  Result Date: 06/22/2019 CLINICAL DATA:  Shortness of breath EXAM: PORTABLE CHEST 1 VIEW COMPARISON:  May 31, 2019 FINDINGS: The cardiomediastinal silhouette is obscured. Overlying median sternotomy wires. Large area of patchy consolidation seen throughout the left mid and lower lung. There is increased interstitial markings seen in the left upper lung. Small amount of hazy airspace opacity at the right lung base. There is a blunting of the bilateral costophrenic angles, likely small bilateral pleural effusions. No acute osseous abnormality. IMPRESSION: Multifocal patchy airspace consolidation seen predominantly throughout the left lung and right lung base. This is likely due to multifocal pneumonia. Small bilateral pleural effusions. Electronically Signed   By: Prudencio Pair M.D.   On: 06/22/2019 23:47   ECHOCARDIOGRAM COMPLETE  Result Date: 06/23/2019   ECHOCARDIOGRAM REPORT   Patient Name:   Andrea [name] Date  of Exam: 06/23/2019 Medical Rec #:  [id number]        Height:       62.0 in Accession #:    [id number]       Weight:       151.0 lb Date of Birth:  1946-05-01        BSA:          1.70 m Patient Age:    30 years         BP:           138/59 mmHg Patient Gender: F                HR:           90 bpm. Exam Location:  [facility] Procedure: 2D Echo, Cardiac Doppler and Color Doppler Indications:     Abnormal ECG 794.31  History:         Patient has no prior history of Echocardiogram examinations.                  Prior CABG, COPD; Risk  Factors:Diabetes. Heart attack.  Sonographer:     Sherrie Sport RDCS (AE) Referring Phys:  [id number] Athena Masse Diagnosing Phys: Nelva Bush MD  Sonographer Comments: CABG surgical scar. IMPRESSIONS  1. Left ventricular ejection fraction, by visual estimation, is 40 to 45%. The left ventricle has moderately decreased function. There is mildly increased left ventricular hypertrophy.  2. Moderate hypokinesis of the left ventricular, entire anteroseptal wall and anterior wall.  3. Elevated left atrial pressure.  4. Left ventricular diastolic parameters are consistent with Grade II diastolic dysfunction (pseudonormalization).  5. The left ventricle demonstrates regional wall motion abnormalities.  6. Global right ventricle has normal systolic function.The right ventricular size is normal. No increase in right ventricular wall thickness.  7. Left atrial size was normal.  8. Right atrial size was normal.  9. Mild mitral annular calcification. 10. The mitral valve is degenerative. Trivial mitral valve regurgitation. No evidence of mitral stenosis. 11. The tricuspid valve is not well visualized. 12. The aortic valve was not well visualized. Aortic valve regurgitation is not visualized. Mild aortic valve sclerosis without stenosis. 13. The pulmonic valve was not well visualized. Pulmonic valve regurgitation is not visualized. 14. Moderately elevated pulmonary artery systolic pressure. 15. The inferior vena cava is normal in size with greater than 50% respiratory variability, suggesting right atrial pressure of 3 mmHg. FINDINGS  Left Ventricle: Left ventricular ejection fraction, by visual estimation, is 40 to 45%. The left ventricle has moderately decreased function. Moderate hypokinesis of the left ventricular, entire anteroseptal wall and anterior wall. The left ventricle demonstrates regional wall motion abnormalities. The left ventricular internal cavity size was the left ventricle is normal in size. There is mildly  increased left ventricular hypertrophy. Left ventricular diastolic parameters are consistent with Grade II diastolic dysfunction (pseudonormalization). Elevated left atrial pressure. Right Ventricle: The right ventricular size is normal. No increase in right ventricular wall thickness. Global RV systolic function is has normal systolic function. The tricuspid regurgitant velocity is 3.12 m/s, and with an assumed right atrial pressure  of 3 mmHg, the estimated right ventricular systolic pressure is moderately elevated at 41.9 mmHg. Left Atrium: Left atrial size was normal in size. Right Atrium: Right atrial size was normal in size Pericardium: There is no evidence of pericardial effusion. Mitral Valve: The mitral valve is degenerative in appearance. There is mild thickening of the mitral valve leaflet(s). Mild mitral annular calcification.  Trivial mitral valve regurgitation. No evidence of mitral valve stenosis by observation. Tricuspid Valve: The tricuspid valve is not well visualized. Tricuspid valve regurgitation is mild. Aortic Valve: The aortic valve was not well visualized. Aortic valve regurgitation is not visualized. Mild aortic valve sclerosis is present, with no evidence of aortic valve stenosis. Aortic valve mean gradient measures 4.0 mmHg. Aortic valve peak gradient measures 6.1 mmHg. Aortic valve area, by VTI measures 1.51 cm. Pulmonic Valve: The pulmonic valve was not well visualized. Pulmonic valve regurgitation is not visualized. Pulmonic regurgitation is not visualized. No evidence of pulmonic stenosis. Aorta: The aortic root is normal in size and structure. Pulmonary Artery: The pulmonary artery is not well seen. Venous: The inferior vena cava is normal in size with greater than 50% respiratory variability, suggesting right atrial pressure of 3 mmHg. IAS/Shunts: No atrial level shunt detected by color flow Doppler.  LEFT VENTRICLE PLAX 2D LVIDd:         4.87 cm       Diastology LVIDs:         3.80  cm       LV e' lateral:   8.38 cm/s LV PW:         1.10 cm       LV E/e' lateral: 10.8 LV IVS:        0.66 cm       LV e' medial:    2.83 cm/s LVOT diam:     2.00 cm       LV E/e' medial:  31.9 LV SV:         49 ml LV SV Index:   28.14 LVOT Area:     3.14 cm  LV Volumes (MOD) LV area d, A2C:    23.80 cm LV area d, A4C:    22.80 cm LV area s, A2C:    18.00 cm LV area s, A4C:    16.10 cm LV major d, A2C:   6.22 cm LV major d, A4C:   6.17 cm LV major s, A2C:   5.94 cm LV major s, A4C:   5.34 cm LV vol d, MOD A2C: 75.8 ml LV vol d, MOD A4C: 69.0 ml LV vol s, MOD A2C: 47.7 ml LV vol s, MOD A4C: 41.2 ml LV SV MOD A2C:     28.1 ml LV SV MOD A4C:     69.0 ml LV SV MOD BP:      25.8 ml RIGHT VENTRICLE RV Basal diam:  2.55 cm RV S prime:     10.10 cm/s TAPSE (M-mode): 3.7 cm LEFT ATRIUM             Index       RIGHT ATRIUM           Index LA diam:        3.90 cm 2.30 cm/m  RA Area:     15.40 cm LA Vol (A2C):   40.3 ml 23.76 ml/m RA Volume:   38.40 ml  22.64 ml/m LA Vol (A4C):   36.6 ml 21.57 ml/m LA Biplane Vol: 41.6 ml 24.52 ml/m  AORTIC VALVE                   PULMONIC VALVE AV Area (Vmax):    1.41 cm    PV Vmax:        0.75 m/s AV Area (Vmean):   1.27 cm    PV Peak grad:   2.2 mmHg AV Area (VTI):  1.51 cm    RVOT Peak grad: 3 mmHg AV Vmax:           123.67 cm/s AV Vmean:          95.567 cm/s AV VTI:            0.298 m AV Peak Grad:      6.1 mmHg AV Mean Grad:      4.0 mmHg LVOT Vmax:         55.40 cm/s LVOT Vmean:        38.500 cm/s LVOT VTI:          0.143 m LVOT/AV VTI ratio: 0.48  AORTA Ao Root diam: 2.10 cm MITRAL VALVE                        TRICUSPID VALVE MV Area (PHT): 4.68 cm             TR Peak grad:   38.9 mmHg MV PHT:        46.98 msec           TR Vmax:        312.00 cm/s MV Decel Time: 162 msec MV E velocity: 90.20 cm/s 103 cm/s  SHUNTS MV A velocity: 60.10 cm/s 70.3 cm/s Systemic VTI:  0.14 m MV E/A ratio:  1.50       1.5       Systemic Diam: 2.00 cm  Nelva Bush MD Electronically  signed by Nelva Bush MD Signature Date/Time: 06/23/2019/9:56:58 AM    Final     Pending Labs Unresulted Labs (From admission, onward)    Start     Ordered   06/24/19 [id number]  Basic metabolic panel  Daily,   STAT     06/23/19 0154   06/24/19 0500  CBC  Daily,   STAT     06/23/19 1436   06/23/19 1439  SARS CORONAVIRUS 2 (TAT 6-24 HRS) Nasopharyngeal Nasopharyngeal Swab  Once,   STAT    Question Answer Comment  Is this test for diagnosis or screening Diagnosis of ill patient   Symptomatic for COVID-19 as defined by CDC Unknown   Hospitalized for COVID-19 No   Admitted to ICU for COVID-19 No   Previously tested for COVID-19 Yes   Resident in a congregate (group) care setting Unknown   Employed in healthcare setting No   Pregnant No      06/23/19 1439   06/23/19 0150  Sputum culture  (Non-severe pneumonia (non-ICU care) in adult without resistant organism risk factors.)  Once,   STAT     06/23/19 0154   06/22/19 2318  Urinalysis, Complete w Microscopic  ONCE - STAT,   STAT     06/22/19 2317   06/22/19 2318  Urine culture  Once,   STAT     06/22/19 2317   06/22/19 2312  Lactic acid, plasma  Now then every 2 hours,   STAT     06/22/19 2312          Vitals/Pain Today's Vitals   06/23/19 1130 06/23/19 1557 06/23/19 1600 06/23/19 1712  BP: (!) 107/49 (!) 116/56 (!) 132/57 (!) 109/56  Pulse: 87 85 85 86  Resp: 19 20 17  (!) 22  Temp:      TempSrc:      SpO2: 95% 94% 95% 93%  Weight:      Height:      PainSc:        Isolation Precautions  Airborne and Contact precautions  Medications Medications  sodium chloride flush (NS) 0.9 % injection 3 mL (has no administration in time range)  sodium chloride flush (NS) 0.9 % injection 3 mL (has no administration in time range)  0.9 %  sodium chloride infusion (250 mLs Intravenous New Bag/Given 06/23/19 0332)  cefTRIAXone (ROCEPHIN) 2 g in sodium chloride 0.9 % 100 mL IVPB (0 g Intravenous Stopped 06/23/19 0404)  azithromycin  (ZITHROMAX) 500 mg in sodium chloride 0.9 % 250 mL IVPB (0 mg Intravenous Stopped 06/23/19 0735)  guaiFENesin-dextromethorphan (ROBITUSSIN DM) 100-10 MG/5ML syrup 10 mL (has no administration in time range)  metoprolol succinate (TOPROL-XL) 24 hr tablet 25 mg (25 mg Oral Given 06/23/19 1202)  insulin aspart (novoLOG) injection 0-20 Units (4 Units Subcutaneous Given 06/23/19 1809)  ipratropium-albuterol (DUONEB) 0.5-2.5 (3) MG/3ML nebulizer solution 3 mL (has no administration in time range)  insulin glargine (LANTUS) injection 10 Units (has no administration in time range)  apixaban (ELIQUIS) tablet 5 mg (5 mg Oral Given 06/23/19 1610)  insulin aspart (novoLOG) injection 5 Units (5 Units Subcutaneous Given 06/23/19 1810)  furosemide (LASIX) injection 40 mg (has no administration in time range)  acetaminophen (TYLENOL) tablet 1,000 mg (1,000 mg Oral Given 06/22/19 2331)  methylPREDNISolone sodium succinate (SOLU-MEDROL) 125 mg/2 mL injection 125 mg (125 mg Intravenous Given 06/23/19 0059)  ipratropium-albuterol (DUONEB) 0.5-2.5 (3) MG/3ML nebulizer solution 3 mL (3 mLs Nebulization Given 06/23/19 0113)  furosemide (LASIX) injection 40 mg (40 mg Intravenous Given 06/23/19 [id number])    Mobility walks with person assist Low fall risk   Focused Assessments    R Recommendations: See Admitting Provider Note  Report given to:

## 2019-06-23 NOTE — ED Notes (Signed)
Patient assisted with bedpan. Patient attempting to urinate at this time.

## 2019-06-23 NOTE — ED Notes (Signed)
Patient placed on 10L NRB per Dr. Beather Arbour. Patient is awake and alert and able to answer questions. Denies need for anything at this time. Will continue to monitor.

## 2019-06-23 NOTE — Progress Notes (Signed)
*  PRELIMINARY RESULTS* Echocardiogram 2D Echocardiogram has been performed.  Sherrie Sport 06/23/2019, 9:27 AM

## 2019-06-23 NOTE — ED Notes (Signed)
Patient tolerated breathing treatment well. Will continue to monitor.

## 2019-06-23 NOTE — ED Notes (Signed)
Patient's daughter called to check on patient. With patient's permission, information given to daughter and encouraged her to call back on day shift for further updates.

## 2019-06-24 LAB — CBC
HCT: 23.4 % — ABNORMAL LOW (ref 36.0–46.0)
Hemoglobin: 7.5 g/dL — ABNORMAL LOW (ref 12.0–15.0)
MCH: 28.4 pg (ref 26.0–34.0)
MCHC: 32.1 g/dL (ref 30.0–36.0)
MCV: 88.6 fL (ref 80.0–100.0)
Platelets: 276 10*3/uL (ref 150–400)
RBC: 2.64 MIL/uL — ABNORMAL LOW (ref 3.87–5.11)
RDW: 15.6 % — ABNORMAL HIGH (ref 11.5–15.5)
WBC: 9.7 10*3/uL (ref 4.0–10.5)
nRBC: 0 % (ref 0.0–0.2)

## 2019-06-24 LAB — GLUCOSE, CAPILLARY
Glucose-Capillary: 144 mg/dL — ABNORMAL HIGH (ref 70–99)
Glucose-Capillary: 169 mg/dL — ABNORMAL HIGH (ref 70–99)
Glucose-Capillary: 180 mg/dL — ABNORMAL HIGH (ref 70–99)
Glucose-Capillary: 221 mg/dL — ABNORMAL HIGH (ref 70–99)
Glucose-Capillary: 235 mg/dL — ABNORMAL HIGH (ref 70–99)
Glucose-Capillary: 290 mg/dL — ABNORMAL HIGH (ref 70–99)

## 2019-06-24 LAB — BASIC METABOLIC PANEL
Anion gap: 12 (ref 5–15)
BUN: 50 mg/dL — ABNORMAL HIGH (ref 8–23)
CO2: 29 mmol/L (ref 22–32)
Calcium: 8.6 mg/dL — ABNORMAL LOW (ref 8.9–10.3)
Chloride: 100 mmol/L (ref 98–111)
Creatinine, Ser: 1.76 mg/dL — ABNORMAL HIGH (ref 0.44–1.00)
GFR calc Af Amer: 33 mL/min — ABNORMAL LOW (ref >60)
GFR calc non Af Amer: 28 mL/min — ABNORMAL LOW (ref >60)
Glucose, Bld: 168 mg/dL — ABNORMAL HIGH (ref 70–99)
Potassium: 3.8 mmol/L (ref 3.5–5.1)
Sodium: 141 mmol/L (ref 135–145)

## 2019-06-24 LAB — ABO/RH: ABO/RH(D): A POS

## 2019-06-24 MED ORDER — INSULIN ASPART 100 UNIT/ML ~~LOC~~ SOLN
8.0000 [IU] | Freq: Three times a day (TID) | SUBCUTANEOUS | Status: DC
  Administered 2019-06-24 – 2019-06-29 (×15): 8 [IU] via SUBCUTANEOUS
  Filled 2019-06-24 (×15): qty 1

## 2019-06-24 MED ORDER — METHYLPREDNISOLONE SODIUM SUCC 125 MG IJ SOLR
60.0000 mg | Freq: Two times a day (BID) | INTRAMUSCULAR | Status: DC
  Administered 2019-06-24 – 2019-06-28 (×9): 60 mg via INTRAVENOUS
  Filled 2019-06-24 (×9): qty 2

## 2019-06-24 MED ORDER — PANTOPRAZOLE SODIUM 20 MG PO TBEC
20.0000 mg | DELAYED_RELEASE_TABLET | Freq: Every day | ORAL | Status: DC
  Administered 2019-06-25 – 2019-06-29 (×5): 20 mg via ORAL
  Filled 2019-06-24 (×6): qty 1

## 2019-06-24 MED ORDER — BUDESONIDE 0.5 MG/2ML IN SUSP: 0.5000 mg | Freq: Two times a day (BID) | RESPIRATORY_TRACT | Status: DC

## 2019-06-24 MED ORDER — TIOTROPIUM BROMIDE MONOHYDRATE 18 MCG IN CAPS
18.0000 ug | ORAL_CAPSULE | Freq: Every day | RESPIRATORY_TRACT | Status: DC
  Administered 2019-06-24 – 2019-06-29 (×6): 18 ug via RESPIRATORY_TRACT
  Filled 2019-06-24 (×2): qty 5

## 2019-06-24 MED ORDER — ATORVASTATIN CALCIUM 20 MG PO TABS
40.0000 mg | ORAL_TABLET | Freq: Every day | ORAL | Status: DC
  Administered 2019-06-24 – 2019-06-28 (×5): 40 mg via ORAL
  Filled 2019-06-24 (×5): qty 2

## 2019-06-24 MED ORDER — ACETAMINOPHEN 325 MG PO TABS
650.0000 mg | ORAL_TABLET | Freq: Four times a day (QID) | ORAL | Status: DC | PRN
  Administered 2019-06-24: 06:00:00 650 mg via ORAL
  Filled 2019-06-24: qty 2

## 2019-06-24 MED ORDER — BUDESONIDE 180 MCG/ACT IN AEPB
1.0000 | INHALATION_SPRAY | Freq: Two times a day (BID) | RESPIRATORY_TRACT | Status: DC
  Administered 2019-06-24 – 2019-06-29 (×10): 1 via RESPIRATORY_TRACT
  Filled 2019-06-24: qty 1

## 2019-06-24 NOTE — Progress Notes (Signed)
PROGRESS NOTE    Andrea [name]  [id number] DOB: 1945-11-24 DOA: 06/22/2019 PCP: Andrea Holstein, MD       Assessment & Plan:   Principal Problem:   Acute on chronic respiratory failure with hypoxia [facility]) Active Problems:   CKD (chronic kidney disease) stage 3, GFR 30-59 ml/min   History of 2019 novel coronavirus disease (COVID-19)   Multifocal pneumonia   Acute CHF (congestive heart failure) (HCC)   COPD with chronic bronchitis (HCC)   Non-insulin dependent type 2 diabetes mellitus (HCC)   Chronic anticoagulation   Acute on chronic respiratory failure with hypoxia: has not improved. Possibly multifactorial and related to worsening Covid related multifocal pneumonia, COPD exacerbation & CHF exacerbation. Repeat COVID19 positive. Continue airborne & contact precautions. Continue on supplemental oxygen and wean back to baseline as tolerated which is 4L [place]. Currently pt is on 5L [place]   Persistent multifocal pneumonia: bacterial (possible gram positive or neg) and/or viral (COVID19 positive). Continue on Rocephin and azithromycin for possible bacterial component. Continue on bronchodilators. Will start IV steroids. Pt previously received the complete course of remdesivir so will not order. Encourage incentive spirometry. 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 Continue on IV Lasix, continue on metoprolol. Monitor I/Os.  Not currently on ACE inhibitor likely because of chronic kidney disease.  Elevated troponin: in patient with history of CAD s/p CABG. Likely secondary to demand ischemia but trending up so cardio was consulted and awaiting recs.   CKDIIb: Cr is trending down today. Avoid nephrotoxic meds. Will continue to monitor   COPD exacerbation: continue on abxs & bronchodilators. Will start IV steroids. Continue on supplemental oxygen and wean back to baseline as tolerated   DM2: poorly  controlled. Will continue to hold home oral hypoglycemics. 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 No need for a transfusion at this time. Will continue to monitor   Chronic anticoagulation: continue on eliquis  DVT prophylaxis: eliquis Code Status: full  Family Communication: Disposition Plan:.   Consultants:   Cardio    Procedures:    Antimicrobials: azithromycin, rocephin    Subjective: Pt c/o shortness of breath still  Objective: Vitals:   06/23/19 2054 06/24/19 0316 06/24/19 0500 06/24/19 1149  BP:   125/60 123/62  Pulse:   85 80  Resp:   18 16  Temp:   (!) 100.6 F (38.1 C) 98.1 F (36.7 C)  TempSrc:   Oral Oral  SpO2:   95% 100%  Weight: 68.8 kg 68.8 kg    Height: 5\' 2"  (1.575 m)       Intake/Output Summary (Last 24 hours) at 06/24/2019 1356 Last data filed at 06/24/2019 0631 Gross per 24 hour  Intake 723.35 ml  Output 250 ml  Net 473.35 ml   Filed Weights   06/22/19 2305 06/23/19 2054 06/24/19 0316  Weight: 68.5 kg 68.8 kg 68.8 kg    Examination:  General exam: Appears calm but uncomfortable  Respiratory system: severely decreased breath sounds b/l. No wheezes Cardiovascular system: S1 & S2 normal. No rubs, gallops or clicks.  Gastrointestinal system: Abdomen is nondistended, soft and nontender.Normal bowel sounds heard. Central nervous system: Alert and oriented. Moves all 4 extremities Psychiatry: Judgement and insight appear normal. Flat mood and affect    Data Reviewed: I have personally reviewed following labs and imaging studies  CBC: Recent Labs  [name] 06/22/19 2324 06/23/19 1223 06/24/19 0558  WBC 9.9 6.6 9.7  HGB 8.7* 7.9* 7.5*  HCT 28.4* 25.0* 23.4*  MCV 93.4 89.3 88.6  PLT 267 238 [id number]   Basic Metabolic Panel: Recent Labs  [name] 06/22/19 2324 06/23/19 1223 06/24/19 0558  NA 135 135 141  K 4.3 3.8 3.8  CL 95* 94* 100  CO2 27 27 29   GLUCOSE 274* 522* 168*    BUN 33* 42* 50*  CREATININE 1.70* 1.97* 1.76*  CALCIUM 8.5* 8.5* 8.6*   GFR: Estimated Creatinine Clearance: 26.3 mL/min (A) (by C-G formula based on SCr of 1.76 mg/dL (H)). Liver Function Tests: Recent Labs  [name] 06/22/19 2324  AST 24  ALT 19  ALKPHOS 77  BILITOT 0.7  PROT 7.2  ALBUMIN 2.5*   No results for input(s): LIPASE, AMYLASE in the last 168 hours. No results for input(s): AMMONIA in the last 168 hours. Coagulation Profile: No results for input(s): INR, PROTIME in the last 168 hours. Cardiac Enzymes: No results for input(s): CKTOTAL, CKMB, CKMBINDEX, TROPONINI in the last 168 hours. BNP (last 3 results) No results for input(s): PROBNP in the last 8760 hours. HbA1C: No results for input(s): HGBA1C in the last 72 hours. CBG: Recent Labs  [name] 06/23/19 2037 06/24/19 0019 06/24/19 0315 06/24/19 0727 06/24/19 1147  GLUCAP 318* 235* 169* 180* 290*   Lipid Profile: No results for input(s): CHOL, HDL, LDLCALC, TRIG, CHOLHDL, LDLDIRECT in the last 72 hours. Thyroid Function Tests: No results for input(s): TSH, T4TOTAL, FREET4, T3FREE, THYROIDAB in the last 72 hours. Anemia Panel: No results for input(s): VITAMINB12, FOLATE, FERRITIN, TIBC, IRON, RETICCTPCT in the last 72 hours. Sepsis Labs: Recent Labs  [name] 06/22/19 2313 06/22/19 2324  PROCALCITON  --  0.23  LATICACIDVEN 1.3  --     Recent Results (from the past 240 hour(s))  Blood culture (routine x 2)     Status: None (Preliminary result)   Collection Time: 06/22/19 11:12 PM   Specimen: BLOOD  Result Value Ref Range Status   Specimen Description BLOOD PERIPHERAL IV  Final   Special Requests   Final    BOTTLES DRAWN AEROBIC AND ANAEROBIC Blood Culture adequate volume   Culture   Final    NO GROWTH 2 DAYS Performed at [facility], [address] [place], [place] [address]    Report Status PENDING  Incomplete  Blood culture (routine x 2)     Status: None (Preliminary result)   Collection  Time: 06/22/19 11:24 PM   Specimen: BLOOD  Result Value Ref Range Status   Specimen Description BLOOD LEFT ARM  Final   Special Requests   Final    BOTTLES DRAWN AEROBIC AND ANAEROBIC Blood Culture adequate volume   Culture   Final    NO GROWTH 2 DAYS Performed at [facility], [address], [place], [place] [address]    Report Status PENDING  Incomplete  Respiratory Panel by PCR     Status: None   Collection Time: 06/23/19  7:46 AM   Specimen: Nasopharyngeal Swab; Respiratory  Result Value Ref Range Status   Adenovirus NOT DETECTED NOT DETECTED Final   Coronavirus 229E NOT DETECTED NOT DETECTED Final    Comment: (NOTE) The Coronavirus on the Respiratory Panel, DOES NOT test for the novel  Coronavirus (2019 nCoV)    Coronavirus HKU1 NOT DETECTED NOT DETECTED Final   Coronavirus NL63 NOT DETECTED NOT DETECTED Final   Coronavirus OC43 NOT  DETECTED NOT DETECTED Final   Metapneumovirus NOT DETECTED NOT DETECTED Final   Rhinovirus / Enterovirus NOT DETECTED NOT DETECTED Final   Influenza A NOT DETECTED NOT DETECTED Final   Influenza B NOT DETECTED NOT DETECTED Final   Parainfluenza Virus 1 NOT DETECTED NOT DETECTED Final   Parainfluenza Virus 2 NOT DETECTED NOT DETECTED Final   Parainfluenza Virus 3 NOT DETECTED NOT DETECTED Final   Parainfluenza Virus 4 NOT DETECTED NOT DETECTED Final   Respiratory Syncytial Virus NOT DETECTED NOT DETECTED Final   Bordetella pertussis NOT DETECTED NOT DETECTED Final   Chlamydophila pneumoniae NOT DETECTED NOT DETECTED Final   Mycoplasma pneumoniae NOT DETECTED NOT DETECTED Final    Comment: Performed at [name], [place] [address], [place], [place] [address]  SARS CORONAVIRUS 2 (TAT 6-24 HRS) Nasopharyngeal Nasopharyngeal Swab     Status: Abnormal   Collection Time: 06/23/19  2:39 PM   Specimen: Nasopharyngeal Swab  Result Value Ref Range Status   SARS Coronavirus 2 POSITIVE (A) NEGATIVE Final    Comment: RESULT CALLED TO,  READ BACK BY AND VERIFIED WITH: E.LOUISSAINT RN 2221 06/23/2019 MCCORMICK K (NOTE) SARS-CoV-2 target nucleic acids are DETECTED. The SARS-CoV-2 RNA is generally detectable in upper and lower respiratory specimens during the acute phase of infection. Positive results are indicative of the presence of SARS-CoV-2 RNA. Clinical correlation with patient history and other diagnostic information is  necessary to determine patient infection status. Positive results do not rule out bacterial infection or co-infection with other viruses.  The expected result is Negative. Fact Sheet for Patients: SugarRoll.be Fact Sheet for Healthcare Providers: https://www.woods-mathews.com/ This test is not yet approved or cleared by the [place] FDA and  has been authorized for detection and/or diagnosis of SARS-CoV-2 by FDA under an Emergency Use Authorization (EUA). This EUA will remain  in effect (meaning this test can be used)  for the duration of the COVID-19 declaration under Section 564(b)(1) of the Act, 21 U.S.C. section 360bbb-3(b)(1), unless the authorization is terminated or revoked sooner. Performed at [name], [place] [address] [place], [place] [address]          Radiology Studies: DG Chest Portable 1 View  Result Date: 06/22/2019 CLINICAL DATA:  Shortness of breath EXAM: PORTABLE CHEST 1 VIEW COMPARISON:  May 31, 2019 FINDINGS: The cardiomediastinal silhouette is obscured. Overlying median sternotomy wires. Large area of patchy consolidation seen throughout the left mid and lower lung. There is increased interstitial markings seen in the left upper lung. Small amount of hazy airspace opacity at the right lung base. There is a blunting of the bilateral costophrenic angles, likely small bilateral pleural effusions. No acute osseous abnormality. IMPRESSION: Multifocal patchy airspace consolidation seen predominantly throughout the left  lung and right lung base. This is likely due to multifocal pneumonia. Small bilateral pleural effusions. Electronically Signed   By: Prudencio Pair M.D.   On: 06/22/2019 23:47   ECHOCARDIOGRAM COMPLETE  Result Date: 06/23/2019   ECHOCARDIOGRAM REPORT   Patient Name:   LOVELL ROMANCHUK Date of Exam: 06/23/2019 Medical Rec #:  [id number]        Height:       62.0 in Accession #:    [id number]       Weight:       151.0 lb Date of Birth:  [birth year]        BSA:          1.70 m Patient Age:  73 years         BP:           138/59 mmHg Patient Gender: F                HR:           90 bpm. Exam Location:  [facility] Procedure: 2D Echo, Cardiac Doppler and Color Doppler Indications:     Abnormal ECG 794.31  History:         Patient has no prior history of Echocardiogram examinations.                  Prior CABG, COPD; Risk Factors:Diabetes. Heart attack.  Sonographer:     Sherrie Sport RDCS (AE) Referring Phys:  [id number] Athena Masse Diagnosing Phys: Nelva Bush MD  Sonographer Comments: CABG surgical scar. IMPRESSIONS  1. Left ventricular ejection fraction, by visual estimation, is 40 to 45%. The left ventricle has moderately decreased function. There is mildly increased left ventricular hypertrophy.  2. Moderate hypokinesis of the left ventricular, entire anteroseptal wall and anterior wall.  3. Elevated left atrial pressure.  4. Left ventricular diastolic parameters are consistent with Grade II diastolic dysfunction (pseudonormalization).  5. The left ventricle demonstrates regional wall motion abnormalities.  6. Global right ventricle has normal systolic function.The right ventricular size is normal. No increase in right ventricular wall thickness.  7. Left atrial size was normal.  8. Right atrial size was normal.  9. Mild mitral annular calcification. 10. The mitral valve is degenerative. Trivial mitral valve regurgitation. No evidence of mitral stenosis. 11. The tricuspid valve is not well visualized. 12. The aortic  valve was not well visualized. Aortic valve regurgitation is not visualized. Mild aortic valve sclerosis without stenosis. 13. The pulmonic valve was not well visualized. Pulmonic valve regurgitation is not visualized. 14. Moderately elevated pulmonary artery systolic pressure. 15. The inferior vena cava is normal in size with greater than 50% respiratory variability, suggesting right atrial pressure of 3 mmHg. FINDINGS  Left Ventricle: Left ventricular ejection fraction, by visual estimation, is 40 to 45%. The left ventricle has moderately decreased function. Moderate hypokinesis of the left ventricular, entire anteroseptal wall and anterior wall. The left ventricle demonstrates regional wall motion abnormalities. The left ventricular internal cavity size was the left ventricle is normal in size. There is mildly increased left ventricular hypertrophy. Left ventricular diastolic parameters are consistent with Grade II diastolic dysfunction (pseudonormalization). Elevated left atrial pressure. Right Ventricle: The right ventricular size is normal. No increase in right ventricular wall thickness. Global RV systolic function is has normal systolic function. The tricuspid regurgitant velocity is 3.12 m/s, and with an assumed right atrial pressure  of 3 mmHg, the estimated right ventricular systolic pressure is moderately elevated at 41.9 mmHg. Left Atrium: Left atrial size was normal in size. Right Atrium: Right atrial size was normal in size Pericardium: There is no evidence of pericardial effusion. Mitral Valve: The mitral valve is degenerative in appearance. There is mild thickening of the mitral valve leaflet(s). Mild mitral annular calcification. Trivial mitral valve regurgitation. No evidence of mitral valve stenosis by observation. Tricuspid Valve: The tricuspid valve is not well visualized. Tricuspid valve regurgitation is mild. Aortic Valve: The aortic valve was not well visualized. Aortic valve regurgitation  is not visualized. Mild aortic valve sclerosis is present, with no evidence of aortic valve stenosis. Aortic valve mean gradient measures 4.0 mmHg. Aortic valve peak gradient measures 6.1 mmHg. Aortic valve area, by VTI measures  1.51 cm. Pulmonic Valve: The pulmonic valve was not well visualized. Pulmonic valve regurgitation is not visualized. Pulmonic regurgitation is not visualized. No evidence of pulmonic stenosis. Aorta: The aortic root is normal in size and structure. Pulmonary Artery: The pulmonary artery is not well seen. Venous: The inferior vena cava is normal in size with greater than 50% respiratory variability, suggesting right atrial pressure of 3 mmHg. IAS/Shunts: No atrial level shunt detected by color flow Doppler.  LEFT VENTRICLE PLAX 2D LVIDd:         4.87 cm       Diastology LVIDs:         3.80 cm       LV e' lateral:   8.38 cm/s LV PW:         1.10 cm       LV E/e' lateral: 10.8 LV IVS:        0.66 cm       LV e' medial:    2.83 cm/s LVOT diam:     2.00 cm       LV E/e' medial:  31.9 LV SV:         49 ml LV SV Index:   28.14 LVOT Area:     3.14 cm  LV Volumes (MOD) LV area d, A2C:    23.80 cm LV area d, A4C:    22.80 cm LV area s, A2C:    18.00 cm LV area s, A4C:    16.10 cm LV major d, A2C:   6.22 cm LV major d, A4C:   6.17 cm LV major s, A2C:   5.94 cm LV major s, A4C:   5.34 cm LV vol d, MOD A2C: 75.8 ml LV vol d, MOD A4C: 69.0 ml LV vol s, MOD A2C: 47.7 ml LV vol s, MOD A4C: 41.2 ml LV SV MOD A2C:     28.1 ml LV SV MOD A4C:     69.0 ml LV SV MOD BP:      25.8 ml RIGHT VENTRICLE RV Basal diam:  2.55 cm RV S prime:     10.10 cm/s TAPSE (M-mode): 3.7 cm LEFT ATRIUM             Index       RIGHT ATRIUM           Index LA diam:        3.90 cm 2.30 cm/m  RA Area:     15.40 cm LA Vol (A2C):   40.3 ml 23.76 ml/m RA Volume:   38.40 ml  22.64 ml/m LA Vol (A4C):   36.6 ml 21.57 ml/m LA Biplane Vol: 41.6 ml 24.52 ml/m  AORTIC VALVE                   PULMONIC VALVE AV Area (Vmax):    1.41 cm     PV Vmax:        0.75 m/s AV Area (Vmean):   1.27 cm    PV Peak grad:   2.2 mmHg AV Area (VTI):     1.51 cm    RVOT Peak grad: 3 mmHg AV Vmax:           123.67 cm/s AV Vmean:          95.567 cm/s AV VTI:            0.298 m AV Peak Grad:      6.1 mmHg AV Mean Grad:      4.0  mmHg LVOT Vmax:         55.40 cm/s LVOT Vmean:        38.500 cm/s LVOT VTI:          0.143 m LVOT/AV VTI ratio: 0.48  AORTA Ao Root diam: 2.10 cm MITRAL VALVE                        TRICUSPID VALVE MV Area (PHT): 4.68 cm             TR Peak grad:   38.9 mmHg MV PHT:        46.98 msec           TR Vmax:        312.00 cm/s MV Decel Time: 162 msec MV E velocity: 90.20 cm/s 103 cm/s  SHUNTS MV A velocity: 60.10 cm/s 70.3 cm/s Systemic VTI:  0.14 m MV E/A ratio:  1.50       1.5       Systemic Diam: 2.00 cm  Nelva Bush MD Electronically signed by Nelva Bush MD Signature Date/Time: 06/23/2019/9:56:58 AM    Final         Scheduled Meds: . apixaban  5 mg Oral BID  . atorvastatin  40 mg Oral q1800  . budesonide  1 puff Inhalation BID  . furosemide  40 mg Intravenous Daily  . insulin aspart  0-20 Units Subcutaneous Q4H  . insulin aspart  5 Units Subcutaneous TID WC  . insulin glargine  10 Units Subcutaneous Daily  . methylPREDNISolone (SOLU-MEDROL) injection  60 mg Intravenous Q12H  . metoprolol succinate  25 mg Oral Daily  . pantoprazole  20 mg Oral Daily  . sodium chloride flush  3 mL Intravenous Q12H  . tiotropium  18 mcg Inhalation Daily   Continuous Infusions: . sodium chloride Stopped (06/24/19 0612)  . azithromycin Stopped (06/24/19 0413)  . cefTRIAXone (ROCEPHIN)  IV Stopped (06/24/19 0254)     LOS: 1 day    Time spent: 35 mins    Wyvonnia Dusky, MD [facility] Pager [phone]  If 7PM-7AM, please contact night-coverage [email or web address] Password [email or web address] 06/24/2019, 1:56 PM

## 2019-06-24 NOTE — Consult Note (Signed)
[facility] Consultation Note  Patient ID: Andrea [name], MRN: [id number], DOB/AGE: 03/03/1946 73 y.o. Admit date: 06/22/2019   Date of Consult: 06/24/2019 Primary Physician: Chester Holstein, MD Primary Cardiologist: [facility] ([place]  Chief Complaint:  Chief Complaint  Patient presents with  . Shortness of Breath   Reason for Consult: Elevated troponin and cardiovascular disease  HPI: 73 y.o. female with known coronary disease status post coronary bypass graft with history of anterior myocardial infarction chronic kidney disease with very low filtration rate of 33 who has had multiple episodes of significant pulmonary issues and concerns with Covid.  The patient has been admitted multiple times in this time she has had some cough and congestion and weakness fatigue.  On admission the patient did have a chest x-ray showing pleural effusions a troponin level is 137 consistent with demand ischemia likely hypoxia and congestive heart failure with a BNP of 1179.  EKG showed normal sinus rhythm with nonspecific ST and T wave changes.  The patient has been given appropriate medication management for treatment of her respiratory failure with some improvements.  In addition to that the patient has remained on metoprolol for cardiovascular disease and given Lasix for her pleural effusions and acute on chronic systolic dysfunction congestive heart failure secondary to above issues.  This slow improvement suggest that the patient will continue on medical course as per above.  The patient is hemodynamically stable today  Past Medical History:  Diagnosis Date  . COPD (chronic obstructive pulmonary disease) ([place])   . Diabetes mellitus without complication ([place])   . Heart attack ([place])   . Renal disorder       Surgical History:  Past Surgical History:  Procedure Laterality Date  . ABDOMINAL HYSTERECTOMY    . CARDIAC SURGERY     stents     Home Meds: Prior to Admission medications    Medication Sig Start Date End Date Taking? Authorizing Provider  albuterol (PROVENTIL HFA;VENTOLIN HFA) 108 (90 Base) MCG/ACT inhaler Inhale 2 puffs into the lungs every 6 (six) hours as needed for wheezing or shortness of breath. 11/18/17  Yes Wieting, Richard, MD  amLODipine (NORVASC) 5 MG tablet Take 5 mg by mouth daily.  09/12/18  Yes [provider]  apixaban (ELIQUIS) 5 MG TABS tablet Take 1 tablet (5 mg total) by mouth 2 (two) times daily. 06/10/19  Yes Mercy Riding, MD  aspirin EC 81 MG tablet Take 81 mg by mouth daily.   Yes [provider]  atorvastatin (LIPITOR) 40 MG tablet Take 40 mg by mouth at bedtime. 10/09/17  Yes [provider]  budesonide-formoterol (SYMBICORT) 160-4.5 MCG/ACT inhaler Inhale 2 puffs into the lungs 2 (two) times daily.  06/28/16  Yes [provider]  Cholecalciferol (VITAMIN D3) 1000 units CAPS Take 1,000 Units by mouth daily.    Yes [provider]  ezetimibe (ZETIA) 10 MG tablet Take 10 mg by mouth daily.  04/28/19 04/27/20 Yes [provider]  fluticasone (FLONASE) 50 MCG/ACT nasal spray Place 1 spray into both nostrils daily as needed for allergies.    Yes [provider]  furosemide (LASIX) 20 MG tablet Take 40 mg by mouth daily.  12/04/18  Yes [provider]  glipiZIDE (GLUCOTROL) 10 MG tablet Take 10 mg by mouth 2 (two) times daily before a meal.  03/19/17  Yes [provider]  JANUVIA 50 MG tablet Take 50 mg by mouth daily.  11/20/18  Yes [provider]  metoprolol  succinate (TOPROL-XL) 50 MG 24 hr tablet Take 50 mg by mouth daily.    Yes [provider]  Multiple Vitamin (MULTI-VITAMINS) TABS Take 1 tablet by mouth daily.   Yes [provider]  nitroGLYCERIN (NITROSTAT) 0.4 MG SL tablet Place 0.4 mg under the tongue every 5 (five) minutes as needed for chest pain.  01/19/17  Yes [provider]  omeprazole (PRILOSEC) 20 MG capsule Take 20 mg by  mouth daily.    Yes [provider]  tiotropium (SPIRIVA HANDIHALER) 18 MCG inhalation capsule Place 18 mcg into inhaler and inhale daily.  08/01/17  Yes [provider]  chlorpheniramine-HYDROcodone (TUSSIONEX) 10-8 MG/5ML SUER Take 5 mLs by mouth every 12 (twelve) hours as needed for cough. Patient not taking: Reported on 06/23/2019 05/20/19   Kerney Elbe, DO    Inpatient Medications:  . apixaban  5 mg Oral BID  . furosemide  40 mg Intravenous Daily  . insulin aspart  0-20 Units Subcutaneous Q4H  . insulin aspart  5 Units Subcutaneous TID WC  . insulin glargine  10 Units Subcutaneous Daily  . metoprolol succinate  25 mg Oral Daily  . sodium chloride flush  3 mL Intravenous Q12H   . sodium chloride Stopped (06/24/19 0612)  . azithromycin Stopped (06/24/19 0413)  . cefTRIAXone (ROCEPHIN)  IV Stopped (06/24/19 0254)    Allergies:  Allergies  Allergen Reactions  . Metformin And Related Diarrhea  . Sulfur Hives    Social History   Socioeconomic History  . Marital status: Divorced    Spouse name: Not on file  . Number of children: Not on file  . Years of education: Not on file  . Highest education level: Not on file  Occupational History  . Not on file  Tobacco Use  . Smoking status: Never Smoker  . Smokeless tobacco: Never Used  Substance and Sexual Activity  . Alcohol use: No  . Drug use: No  . Sexual activity: Not on file  Other Topics Concern  . Not on file  Social History Narrative  . Not on file   Social Determinants of Health   Financial Resource Strain:   . Difficulty of Paying Living Expenses: Not on file  Food Insecurity:   . Worried About Charity fundraiser in the Last Year: Not on file  . Ran Out of Food in the Last Year: Not on file  Transportation Needs:   . Lack of Transportation (Medical): Not on file  . Lack of Transportation (Non-Medical): Not on file  Physical Activity:   . Days of Exercise per Week: Not on file  .  Minutes of Exercise per Session: Not on file  Stress:   . Feeling of Stress : Not on file  Social Connections:   . Frequency of Communication with Friends and Family: Not on file  . Frequency of Social Gatherings with Friends and Family: Not on file  . Attends Religious Services: Not on file  . Active Member of Clubs or Organizations: Not on file  . Attends Archivist Meetings: Not on file  . Marital Status: Not on file  Intimate Partner Violence:   . Fear of Current or Ex-Partner: Not on file  . Emotionally Abused: Not on file  . Physically Abused: Not on file  . Sexually Abused: Not on file     No family history on file.    Labs: No results for input(s): CKTOTAL, CKMB, TROPONINI in the last 72 hours. Lab  Results  Component Value Date   WBC 9.7 06/24/2019   HGB 7.5 (L) 06/24/2019   HCT 23.4 (L) 06/24/2019   MCV 88.6 06/24/2019   PLT 276 06/24/2019    Recent Labs  Lab 06/22/19 2324 06/24/19 0558  NA 135 141  K 4.3 3.8  CL 95* 100  CO2 27 29  BUN 33* 50*  CREATININE 1.70* 1.76*  CALCIUM 8.5* 8.6*  PROT 7.2  --   BILITOT 0.7  --   ALKPHOS 77  --   ALT 19  --   AST 24  --   GLUCOSE 274* 168*   Lab Results  Component Value Date   TRIG 110 05/15/2019   Lab Results  Component Value Date   DDIMER 3.12 (H) 06/10/2019    Radiology/Studies:  DG Chest Portable 1 View  Result Date: 06/22/2019 CLINICAL DATA:  Shortness of breath EXAM: PORTABLE CHEST 1 VIEW COMPARISON:  May 31, 2019 FINDINGS: The cardiomediastinal silhouette is obscured. Overlying median sternotomy wires. Large area of patchy consolidation seen throughout the left mid and lower lung. There is increased interstitial markings seen in the left upper lung. Small amount of hazy airspace opacity at the right lung base. There is a blunting of the bilateral costophrenic angles, likely small bilateral pleural effusions. No acute osseous abnormality. IMPRESSION: Multifocal patchy airspace  consolidation seen predominantly throughout the left lung and right lung base. This is likely due to multifocal pneumonia. Small bilateral pleural effusions. Electronically Signed   By: Prudencio Pair M.D.   On: 06/22/2019 23:47   DG Chest Portable 1 View  Result Date: 05/31/2019 CLINICAL DATA:  Shortness of breath, cough, headache. Diagnosed with COVID on 11/20. EXAM: PORTABLE CHEST 1 VIEW COMPARISON:  05/20/2019 FINDINGS: Mild patchy opacity in the lingula and bilateral lower lobes. No pleural effusion or pneumothorax. The heart is normal in size. Postsurgical changes related to prior CABG. Left vascular stent. Median sternotomy. Surgical clips overlying the right breast/lateral chest wall. IMPRESSION: Mild patchy opacities in the lingula and bilateral lower lobes, suspicious for pneumonia. Electronically Signed   By: Julian Hy M.D.   On: 05/31/2019 11:24   ECHOCARDIOGRAM COMPLETE  Result Date: 06/23/2019   ECHOCARDIOGRAM REPORT   Patient Name:   Andrea [name] Date of Exam: 06/23/2019 Medical Rec #:  [id number]        Height:       62.0 in Accession #:    [id number]       Weight:       151.0 lb Date of Birth:  Nov 05, 1945        BSA:          1.70 m Patient Age:    77 years         BP:           138/59 mmHg Patient Gender: F                HR:           90 bpm. Exam Location:  [facility] Procedure: 2D Echo, Cardiac Doppler and Color Doppler Indications:     Abnormal ECG 794.31  History:         Patient has no prior history of Echocardiogram examinations.                  Prior CABG, COPD; Risk Factors:Diabetes. Heart attack.  Sonographer:     Sherrie Sport RDCS (AE) Referring Phys:  [id number] Athena Masse Diagnosing Phys: Harrell Gave End  MD  Sonographer Comments: CABG surgical scar. IMPRESSIONS  1. Left ventricular ejection fraction, by visual estimation, is 40 to 45%. The left ventricle has moderately decreased function. There is mildly increased left ventricular hypertrophy.  2. Moderate hypokinesis of  the left ventricular, entire anteroseptal wall and anterior wall.  3. Elevated left atrial pressure.  4. Left ventricular diastolic parameters are consistent with Grade II diastolic dysfunction (pseudonormalization).  5. The left ventricle demonstrates regional wall motion abnormalities.  6. Global right ventricle has normal systolic function.The right ventricular size is normal. No increase in right ventricular wall thickness.  7. Left atrial size was normal.  8. Right atrial size was normal.  9. Mild mitral annular calcification. 10. The mitral valve is degenerative. Trivial mitral valve regurgitation. No evidence of mitral stenosis. 11. The tricuspid valve is not well visualized. 12. The aortic valve was not well visualized. Aortic valve regurgitation is not visualized. Mild aortic valve sclerosis without stenosis. 13. The pulmonic valve was not well visualized. Pulmonic valve regurgitation is not visualized. 14. Moderately elevated pulmonary artery systolic pressure. 15. The inferior vena cava is normal in size with greater than 50% respiratory variability, suggesting right atrial pressure of 3 mmHg. FINDINGS  Left Ventricle: Left ventricular ejection fraction, by visual estimation, is 40 to 45%. The left ventricle has moderately decreased function. Moderate hypokinesis of the left ventricular, entire anteroseptal wall and anterior wall. The left ventricle demonstrates regional wall motion abnormalities. The left ventricular internal cavity size was the left ventricle is normal in size. There is mildly increased left ventricular hypertrophy. Left ventricular diastolic parameters are consistent with Grade II diastolic dysfunction (pseudonormalization). Elevated left atrial pressure. Right Ventricle: The right ventricular size is normal. No increase in right ventricular wall thickness. Global RV systolic function is has normal systolic function. The tricuspid regurgitant velocity is 3.12 m/s, and with an assumed  right atrial pressure  of 3 mmHg, the estimated right ventricular systolic pressure is moderately elevated at 41.9 mmHg. Left Atrium: Left atrial size was normal in size. Right Atrium: Right atrial size was normal in size Pericardium: There is no evidence of pericardial effusion. Mitral Valve: The mitral valve is degenerative in appearance. There is mild thickening of the mitral valve leaflet(s). Mild mitral annular calcification. Trivial mitral valve regurgitation. No evidence of mitral valve stenosis by observation. Tricuspid Valve: The tricuspid valve is not well visualized. Tricuspid valve regurgitation is mild. Aortic Valve: The aortic valve was not well visualized. Aortic valve regurgitation is not visualized. Mild aortic valve sclerosis is present, with no evidence of aortic valve stenosis. Aortic valve mean gradient measures 4.0 mmHg. Aortic valve peak gradient measures 6.1 mmHg. Aortic valve area, by VTI measures 1.51 cm. Pulmonic Valve: The pulmonic valve was not well visualized. Pulmonic valve regurgitation is not visualized. Pulmonic regurgitation is not visualized. No evidence of pulmonic stenosis. Aorta: The aortic root is normal in size and structure. Pulmonary Artery: The pulmonary artery is not well seen. Venous: The inferior vena cava is normal in size with greater than 50% respiratory variability, suggesting right atrial pressure of 3 mmHg. IAS/Shunts: No atrial level shunt detected by color flow Doppler.  LEFT VENTRICLE PLAX 2D LVIDd:         4.87 cm       Diastology LVIDs:         3.80 cm       LV e' lateral:   8.38 cm/s LV PW:         1.10 cm  LV E/e' lateral: 10.8 LV IVS:        0.66 cm       LV e' medial:    2.83 cm/s LVOT diam:     2.00 cm       LV E/e' medial:  31.9 LV SV:         49 ml LV SV Index:   28.14 LVOT Area:     3.14 cm  LV Volumes (MOD) LV area d, A2C:    23.80 cm LV area d, A4C:    22.80 cm LV area s, A2C:    18.00 cm LV area s, A4C:    16.10 cm LV major d, A2C:    6.22 cm LV major d, A4C:   6.17 cm LV major s, A2C:   5.94 cm LV major s, A4C:   5.34 cm LV vol d, MOD A2C: 75.8 ml LV vol d, MOD A4C: 69.0 ml LV vol s, MOD A2C: 47.7 ml LV vol s, MOD A4C: 41.2 ml LV SV MOD A2C:     28.1 ml LV SV MOD A4C:     69.0 ml LV SV MOD BP:      25.8 ml RIGHT VENTRICLE RV Basal diam:  2.55 cm RV S prime:     10.10 cm/s TAPSE (M-mode): 3.7 cm LEFT ATRIUM             Index       RIGHT ATRIUM           Index LA diam:        3.90 cm 2.30 cm/m  RA Area:     15.40 cm LA Vol (A2C):   40.3 ml 23.76 ml/m RA Volume:   38.40 ml  22.64 ml/m LA Vol (A4C):   36.6 ml 21.57 ml/m LA Biplane Vol: 41.6 ml 24.52 ml/m  AORTIC VALVE                   PULMONIC VALVE AV Area (Vmax):    1.41 cm    PV Vmax:        0.75 m/s AV Area (Vmean):   1.27 cm    PV Peak grad:   2.2 mmHg AV Area (VTI):     1.51 cm    RVOT Peak grad: 3 mmHg AV Vmax:           123.67 cm/s AV Vmean:          95.567 cm/s AV VTI:            0.298 m AV Peak Grad:      6.1 mmHg AV Mean Grad:      4.0 mmHg LVOT Vmax:         55.40 cm/s LVOT Vmean:        38.500 cm/s LVOT VTI:          0.143 m LVOT/AV VTI ratio: 0.48  AORTA Ao Root diam: 2.10 cm MITRAL VALVE                        TRICUSPID VALVE MV Area (PHT): 4.68 cm             TR Peak grad:   38.9 mmHg MV PHT:        46.98 msec           TR Vmax:        312.00 cm/s MV Decel Time: 162 msec MV E velocity: 90.20 cm/s 103 cm/s  SHUNTS MV A velocity: 60.10 cm/s 70.3 cm/s Systemic VTI:  0.14 m MV E/A ratio:  1.50       1.5       Systemic Diam: 2.00 cm  Nelva Bush MD Electronically signed by Nelva Bush MD Signature Date/Time: 06/23/2019/9:56:58 AM    Final     EKG: Normal sinus rhythm with nonspecific ST and T wave changes  Weights: Filed Weights   06/22/19 2305 06/23/19 2054 06/24/19 0316  Weight: 68.5 kg 68.8 kg 68.8 kg     Physical Exam: Blood pressure 125/60, pulse 85, temperature (!) 100.6 F (38.1 C), temperature source Oral, resp. rate 18, height 5\' 2"  (1.575 m),  weight 68.8 kg, SpO2 95 %. Body mass index is 27.74 kg/m. As per prime doc    Assessment: 73 year old female with known coronary disease status post coronary bypass graft LV systolic dysfunction chronic kidney disease having acute chronic systolic dysfunction congestive heart failure with pleural effusions and elevated troponin most consistent with demand ischemia rather than acute coronary syndrome as well as concerns of respiratory failure from Covid infection and slightly improved at this time with no evidence of anginal symptoms requiring further intervention  Plan: 1.  Continue supportive care current condition of coated infection and respiratory failure without restriction 2.  Continue treatment of acute on chronic systolic dysfunction congestive heart failure with pleural effusions and reduce exacerbation had with intravenous Lasix watching closely for worsening chronic kidney disease 3.  Continue metoprolol for heart rate control and risk reduction cardiovascular event 4.  No further cardiac diagnostics necessary at this time 5.  Begin ambulation and follow for improvements of symptoms with no restrictions to rehabilitation from Covid respiratory failure and follow for need for adjustments of medication management  Signed, Corey Skains M.D. [facility] 06/24/2019, 8:21 AM

## 2019-06-24 NOTE — Progress Notes (Signed)
Inpatient Diabetes Program Recommendations  AACE/ADA: New Consensus Statement on Inpatient Glycemic Control (2015)  Target Ranges:  Prepandial:   less than 140 mg/dL      Peak postprandial:   less than 180 mg/dL (1-2 hours)      Critically ill patients:  140 - 180 mg/dL   Results for Andrea [name], Andrea [name] (MRN [id number]) as of 06/24/2019 12:09  Ref. Range 06/23/2019 09:15 06/23/2019 12:01 06/23/2019 17:28 06/23/2019 20:37  Glucose-Capillary Latest Ref Range: 70 - 99 mg/dL 443 (H)  20 units NOVOLOG  498 (H)  20 units NOVOLOG  189 (H)  9 units NOVOLOG +  10 units LANTUS given at 7:21pm  318 (H)  15 units NOVOLOG    Results for Andrea [name], Andrea [name] (MRN [id number]) as of 06/24/2019 12:09  Ref. Range 06/24/2019 00:19 06/24/2019 03:15 06/24/2019 07:27 06/24/2019 11:47  Glucose-Capillary Latest Ref Range: 70 - 99 mg/dL 235 (H)  7 units NOVOLOG  169 (H)  4 units NOVOLOG  180 (H)  10 units NOVOLOG  290 (H)    Home DM Meds: Glipizide 10 mg BID       Januvia 50 mg Daily   Current Insulin Orders: Lantus 10 units Daily         Novolog Resistant Correction Scale/ SSI (0-20 units) Q4 hours         Novolog 5 units TID with meals      Lantus started yest at 7pm--Novolog Meal Coverage started yest at 5pm.   Severely elevated CBGs yest at 8am and 12pm.  Elevated at 12pm today.    MD- Please consider increasing Novolog Meal Coverage to: Novolog 8 units TID with meals     --Will follow patient during hospitalization--  Wyn Quaker RN, MSN, CDE Diabetes Coordinator Inpatient Glycemic Control Team Team Pager: [phone] (8a-5p)

## 2019-06-25 ENCOUNTER — Inpatient Hospital Stay: Payer: Medicare Other

## 2019-06-25 LAB — BASIC METABOLIC PANEL
Anion gap: 9 (ref 5–15)
BUN: 60 mg/dL — ABNORMAL HIGH (ref 8–23)
CO2: 31 mmol/L (ref 22–32)
Calcium: 8.8 mg/dL — ABNORMAL LOW (ref 8.9–10.3)
Chloride: 104 mmol/L (ref 98–111)
Creatinine, Ser: 1.66 mg/dL — ABNORMAL HIGH (ref 0.44–1.00)
GFR calc Af Amer: 35 mL/min — ABNORMAL LOW (ref >60)
GFR calc non Af Amer: 30 mL/min — ABNORMAL LOW (ref >60)
Glucose, Bld: 137 mg/dL — ABNORMAL HIGH (ref 70–99)
Potassium: 5.2 mmol/L — ABNORMAL HIGH (ref 3.5–5.1)
Sodium: 144 mmol/L (ref 135–145)

## 2019-06-25 LAB — GLUCOSE, CAPILLARY
Glucose-Capillary: 141 mg/dL — ABNORMAL HIGH (ref 70–99)
Glucose-Capillary: 167 mg/dL — ABNORMAL HIGH (ref 70–99)
Glucose-Capillary: 240 mg/dL — ABNORMAL HIGH (ref 70–99)
Glucose-Capillary: 230 mg/dL — ABNORMAL HIGH (ref 70–99)
Glucose-Capillary: 265 mg/dL — ABNORMAL HIGH (ref 70–99)

## 2019-06-25 LAB — CBC
HCT: 26.5 % — ABNORMAL LOW (ref 36.0–46.0)
Hemoglobin: 8 g/dL — ABNORMAL LOW (ref 12.0–15.0)
MCH: 28.7 pg (ref 26.0–34.0)
MCHC: 30.2 g/dL (ref 30.0–36.0)
MCV: 95 fL (ref 80.0–100.0)
Platelets: 306 10*3/uL (ref 150–400)
RBC: 2.79 MIL/uL — ABNORMAL LOW (ref 3.87–5.11)
RDW: 15.6 % — ABNORMAL HIGH (ref 11.5–15.5)
WBC: 10.3 10*3/uL (ref 4.0–10.5)
nRBC: 0 % (ref 0.0–0.2)

## 2019-06-25 LAB — POTASSIUM: Potassium: 4.9 mmol/L (ref 3.5–5.1)

## 2019-06-25 NOTE — Progress Notes (Signed)
Per Infection Prevention with [facility], patient is no longer required to be on Airborne/Contact precautions. Patient is also allowed to have one visitor. Daughter [name] is added in the chart.   Fuller Mandril, RN

## 2019-06-25 NOTE — Progress Notes (Signed)
PROGRESS NOTE    Andrea [name]  [id number] DOB: [birth year] DOA: 06/22/2019 PCP: Chester Holstein, MD       Assessment & Plan:   Principal Problem:   Acute on chronic respiratory failure with hypoxia [facility]) Active Problems:   CKD (chronic kidney disease) stage 3, GFR 30-59 ml/min   History of 2019 novel coronavirus disease (COVID-19)   Multifocal pneumonia   Acute CHF (congestive heart failure) (HCC)   COPD with chronic bronchitis (HCC)   Non-insulin dependent type 2 diabetes mellitus (HCC)   Chronic anticoagulation   Acute on chronic respiratory failure with hypoxia: has not improved. Possibly multifactorial and related to worsening Covid related multifocal pneumonia, COPD exacerbation & CHF exacerbation. Repeat COVID19 positive. Continue airborne & contact precautions. Continue on supplemental oxygen and back to baseline which 4L [place].   Persistent multifocal pneumonia: bacterial (possible gram positive or neg) and/or viral (COVID19 positive). Continue on Rocephin and azithromycin for possible bacterial component. Continue on bronchodilators. Will continue on IV steroids. Pt previously received the complete course of remdesivir so will not order. Encourage incentive spirometry. 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 Continue on IV Lasix, metoprolol. Monitor I/Os.  Not currently on ACE inhibitor likely because of chronic kidney disease.  Elevated troponin: in patient with history of CAD s/p CABG. Likely secondary to demand ischemia. Cardio following and recs apprec  CKDIIb: Cr is trending down today. Avoid nephrotoxic meds. Will continue to monitor   Hyperkalemia: repeat K is WNL. Will continue to monitor   COPD exacerbation: continue on abxs & bronchodilators. Will continue IV steroids. Continue on supplemental oxygen and back to baseline   DM2: poorly controlled.  Will continue to hold home oral hypoglycemics. 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 No need for a transfusion at this time. Will continue to monitor   Chronic anticoagulation: continue on eliquis  DVT prophylaxis: eliquis Code Status: full  Family Communication: Disposition Plan:.   Consultants:   Cardio    Procedures:    Antimicrobials: azithromycin, rocephin    Subjective: Pt c/o shortness of breath but slightly improved from day prior. Objective: Vitals:   06/24/19 1149 06/24/19 2049 06/25/19 0632 06/25/19 1221  BP: 123/62 126/64 (!) 102/58 (!) 123/57  Pulse: 80 88 72 80  Resp: 16 20 20    Temp: 98.1 F (36.7 C) 97.9 F (36.6 C) 97.6 F (36.4 C) 97.8 F (36.6 C)  TempSrc: Oral Oral Oral Oral  SpO2: 100% 96% 97% 99%  Weight:      Height:        Intake/Output Summary (Last 24 hours) at 06/25/2019 1247 Last data filed at 06/25/2019 0900 Gross per 24 hour  Intake 620.21 ml  Output 850 ml  Net -229.79 ml   Filed Weights   06/22/19 2305 06/23/19 2054 06/24/19 0316  Weight: 68.5 kg 68.8 kg 68.8 kg    Examination:  General exam: Appears calm but uncomfortable  Respiratory system: diminished breath sounds b/l. No wheezes Cardiovascular system: S1 & S2 normal. No rubs, gallops or clicks.  Gastrointestinal system: Abdomen is nondistended, soft and nontender. Normal bowel sounds heard. Central nervous system: Alert and oriented. Moves all 4 extremities Psychiatry: Judgement and insight appear normal. Normal mood and affect    Data Reviewed: I have personally reviewed following labs and imaging studies  CBC:  Recent Labs  Lab 06/22/19 2324 06/23/19 1223 06/24/19 0558 06/25/19 0724  WBC 9.9 6.6 9.7 10.3  HGB 8.7* 7.9* 7.5* 8.0*  HCT 28.4* 25.0* 23.4* 26.5*  MCV 93.4 89.3 88.6 95.0  PLT 267 238 276 [id number]   Basic Metabolic Panel: Recent Labs  Lab 06/22/19 2324 06/23/19 1223  06/24/19 0558 06/25/19 0349 06/25/19 0944  NA 135 135 141 144  --   K 4.3 3.8 3.8 5.2* 4.9  CL 95* 94* 100 104  --   CO2 27 27 29 31   --   GLUCOSE 274* 522* 168* 137*  --   BUN 33* 42* 50* 60*  --   CREATININE 1.70* 1.97* 1.76* 1.66*  --   CALCIUM 8.5* 8.5* 8.6* 8.8*  --    GFR: Estimated Creatinine Clearance: 27.9 mL/min (A) (by C-G formula based on SCr of 1.66 mg/dL (H)). Liver Function Tests: Recent Labs  Lab 06/22/19 2324  AST 24  ALT 19  ALKPHOS 77  BILITOT 0.7  PROT 7.2  ALBUMIN 2.5*   No results for input(s): LIPASE, AMYLASE in the last 168 hours. No results for input(s): AMMONIA in the last 168 hours. Coagulation Profile: No results for input(s): INR, PROTIME in the last 168 hours. Cardiac Enzymes: No results for input(s): CKTOTAL, CKMB, CKMBINDEX, TROPONINI in the last 168 hours. BNP (last 3 results) No results for input(s): PROBNP in the last 8760 hours. HbA1C: No results for input(s): HGBA1C in the last 72 hours. CBG: Recent Labs  Lab 06/24/19 1629 06/24/19 2055 06/25/19 0124 06/25/19 0826 06/25/19 1103  GLUCAP 221* 144* 141* 167* 240*   Lipid Profile: No results for input(s): CHOL, HDL, LDLCALC, TRIG, CHOLHDL, LDLDIRECT in the last 72 hours. Thyroid Function Tests: No results for input(s): TSH, T4TOTAL, FREET4, T3FREE, THYROIDAB in the last 72 hours. Anemia Panel: No results for input(s): VITAMINB12, FOLATE, FERRITIN, TIBC, IRON, RETICCTPCT in the last 72 hours. Sepsis Labs: Recent Labs  Lab 06/22/19 2313 06/22/19 2324  PROCALCITON  --  0.23  LATICACIDVEN 1.3  --     Recent Results (from the past 240 hour(s))  Blood culture (routine x 2)     Status: None (Preliminary result)   Collection Time: 06/22/19 11:12 PM   Specimen: BLOOD  Result Value Ref Range Status   Specimen Description BLOOD PERIPHERAL IV  Final   Special Requests   Final    BOTTLES DRAWN AEROBIC AND ANAEROBIC Blood Culture adequate volume   Culture   Final    NO GROWTH 2  DAYS Performed at [facility], [address] [place], [place] [address]    Report Status PENDING  Incomplete  Blood culture (routine x 2)     Status: None (Preliminary result)   Collection Time: 06/22/19 11:24 PM   Specimen: BLOOD  Result Value Ref Range Status   Specimen Description BLOOD LEFT ARM  Final   Special Requests   Final    BOTTLES DRAWN AEROBIC AND ANAEROBIC Blood Culture adequate volume   Culture   Final    NO GROWTH 2 DAYS Performed at [facility], [address], [place],  [address]    Report Status PENDING  Incomplete  Respiratory Panel by PCR     Status: None   Collection Time: 06/23/19  7:46 AM   Specimen: Nasopharyngeal Swab; Respiratory  Result Value Ref Range Status   Adenovirus NOT DETECTED NOT DETECTED Final   Coronavirus 229E NOT DETECTED NOT DETECTED Final    Comment: (NOTE) The  Coronavirus on the Respiratory Panel, DOES NOT test for the novel  Coronavirus (2019 nCoV)    Coronavirus HKU1 NOT DETECTED NOT DETECTED Final   Coronavirus NL63 NOT DETECTED NOT DETECTED Final   Coronavirus OC43 NOT DETECTED NOT DETECTED Final   Metapneumovirus NOT DETECTED NOT DETECTED Final   Rhinovirus / Enterovirus NOT DETECTED NOT DETECTED Final   Influenza A NOT DETECTED NOT DETECTED Final   Influenza B NOT DETECTED NOT DETECTED Final   Parainfluenza Virus 1 NOT DETECTED NOT DETECTED Final   Parainfluenza Virus 2 NOT DETECTED NOT DETECTED Final   Parainfluenza Virus 3 NOT DETECTED NOT DETECTED Final   Parainfluenza Virus 4 NOT DETECTED NOT DETECTED Final   Respiratory Syncytial Virus NOT DETECTED NOT DETECTED Final   Bordetella pertussis NOT DETECTED NOT DETECTED Final   Chlamydophila pneumoniae NOT DETECTED NOT DETECTED Final   Mycoplasma pneumoniae NOT DETECTED NOT DETECTED Final    Comment: Performed at [facility], [place] [address] [place], [place] [address]  SARS CORONAVIRUS 2 (TAT 6-24 HRS) Nasopharyngeal Nasopharyngeal Swab      Status: Abnormal   Collection Time: 06/23/19  2:39 PM   Specimen: Nasopharyngeal Swab  Result Value Ref Range Status   SARS Coronavirus 2 POSITIVE (A) NEGATIVE Final    Comment: RESULT CALLED TO, READ BACK BY AND VERIFIED WITH: E.LOUISSAINT RN 2221 06/23/2019 MCCORMICK K (NOTE) SARS-CoV-2 target nucleic acids are DETECTED. The SARS-CoV-2 RNA is generally detectable in upper and lower respiratory specimens during the acute phase of infection. Positive results are indicative of the presence of SARS-CoV-2 RNA. Clinical correlation with patient history and other diagnostic information is  necessary to determine patient infection status. Positive results do not rule out bacterial infection or co-infection with other viruses.  The expected result is Negative. Fact Sheet for Patients: SugarRoll.be Fact Sheet for Healthcare Providers: https://www.woods-mathews.com/ This test is not yet approved or cleared by the [place] FDA and  has been authorized for detection and/or diagnosis of SARS-CoV-2 by FDA under an Emergency Use Authorization (EUA). This EUA will remain  in effect (meaning this test can be used)  for the duration of the COVID-19 declaration under Section 564(b)(1) of the Act, 21 U.S.C. section 360bbb-3(b)(1), unless the authorization is terminated or revoked sooner. Performed at [facility], [place] [address], [place], [place] [address]          Radiology Studies: DG Chest Port 1 View  Result Date: 06/25/2019 CLINICAL DATA:  Pneumonia. EXAM: PORTABLE CHEST 1 VIEW COMPARISON:  June 22, 2019. FINDINGS: Stable cardiomediastinal silhouette. No pneumothorax is noted. Stable bilateral lung opacities are noted, left greater than right, concerning for multifocal pneumonia and probable associated small pleural effusions. Bony thorax is unremarkable. IMPRESSION: Stable bilateral lung opacities are noted, left greater than right,  concerning for multifocal pneumonia and associated small pleural effusions. Electronically Signed   By: Marijo Conception M.D.   On: 06/25/2019 07:59        Scheduled Meds: . apixaban  5 mg Oral BID  . atorvastatin  40 mg Oral q1800  . budesonide  1 puff Inhalation BID  . furosemide  40 mg Intravenous Daily  . insulin aspart  0-20 Units Subcutaneous Q4H  . insulin aspart  8 Units Subcutaneous TID WC  . insulin glargine  10 Units Subcutaneous Daily  . methylPREDNISolone (SOLU-MEDROL) injection  60 mg Intravenous Q12H  . metoprolol succinate  25 mg Oral Daily  . pantoprazole  20 mg Oral Daily  .  sodium chloride flush  3 mL Intravenous Q12H  . tiotropium  18 mcg Inhalation Daily   Continuous Infusions: . sodium chloride Stopped (06/25/19 0134)  . azithromycin Stopped (06/25/19 0427)  . cefTRIAXone (ROCEPHIN)  IV Stopped (06/25/19 0204)     LOS: 2 days    Time spent: 36 mins    Wyvonnia Dusky, MD [facility] Pager [phone]  If 7PM-7AM, please contact night-coverage [email or web address] Password [email or web address] 06/25/2019, 12:47 PM

## 2019-06-25 NOTE — TOC Initial Note (Signed)
Transition of Care [facility]) - Initial/Assessment Note    Patient Details  Name: Andrea [name] MRN: [id number] Date of Birth: 05-05-1946  Transition of Care [facility]) CM/SW Contact:    Beverly Sessions, RN Phone Number: 06/25/2019, 10:06 AM  Clinical Narrative:                  Patient covid positive.  Attempted to reached patient by phone to completed extreme risk for admission screening, and discuss discharge planning. Requested for bedside RN to ensure phone is at patient's bedside        Patient Goals and CMS Choice        Expected Discharge Plan and Services                                                Prior Living Arrangements/Services                       Activities of Daily Living Home Assistive Devices/Equipment: Oxygen, Shower chair with back ADL Screening (condition at time of admission) Patient's cognitive ability adequate to safely complete daily activities?: Yes Is the patient deaf or have difficulty hearing?: Yes Does the patient have difficulty seeing, even when wearing glasses/contacts?: No Does the patient have difficulty concentrating, remembering, or making decisions?: No Patient able to express need for assistance with ADLs?: Yes Does the patient have difficulty dressing or bathing?: No Independently performs ADLs?: Yes (appropriate for developmental age) Does the patient have difficulty walking or climbing stairs?: Yes(O2 at baseline) Weakness of Legs: Both Weakness of Arms/Hands: None  Permission Sought/Granted                  Emotional Assessment              Admission diagnosis:  Shortness of breath [R06.02] Hypoxia [R09.02] COPD exacerbation (HCC) [J44.1] Multifocal pneumonia [J18.9] Acute on chronic congestive heart failure, unspecified heart failure type ([place]) [I50.9] Pneumonia due to COVID-19 virus [U07.1, J12.89] Patient Active Problem List   Diagnosis Date Noted  . Acute on chronic respiratory  failure with hypoxia ([place]) 06/23/2019  . History of 2019 novel coronavirus disease (COVID-19) 06/23/2019  . Multifocal pneumonia 06/23/2019  . Acute CHF (congestive heart failure) ([place]) 06/23/2019  . COPD with chronic bronchitis ([place]) 06/23/2019  . Non-insulin dependent type 2 diabetes mellitus ([place]) 06/23/2019  . Chronic anticoagulation 06/23/2019  . HCAP (healthcare-associated pneumonia) 05/31/2019  . Pancytopenia ([place]) 05/17/2019  . Sepsis ([place]) 05/16/2019  . Fever 05/16/2019  . Pleural effusion with transudate 09/09/2018  . Pulmonary embolism on right ([place]) 09/09/2018  . DOE (dyspnea on exertion) 08/08/2018  . 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 Acute bronchitis with chronic obstructive pulmonary disease (COPD) ([place]) 11/15/2017  . Pneumonia 09/22/2017  . H/O solitary pulmonary nodule 04/14/2014  . Chronic right hip pain 09/01/2013  . Peptic ulcer symptoms 04/04/2013  . GERD (gastroesophageal reflux disease) 10/22/2012  . Atherosclerotic heart disease of native coronary artery without angina pectoris 10/11/2012  . Malignant neoplasm of female breast ([place]) 02/16/2011  . H/O TIA (transient ischemic attack) and stroke 11/01/2009  . CKD (chronic kidney disease) stage 3, GFR 30-59 ml/min 02/23/2009  . Hypertension, benign 11/26/2008   PCP:  Chester Holstein, MD Pharmacy:   [facility] [address] [place]  [place] [address] Phone: [phone] Fax: [phone]     Social Determinants of Health (SDOH) Interventions    Readmission Risk Interventions Readmission Risk Prevention Plan 06/02/2019  Transportation Screening Complete  PCP or Specialist Appt within 3-5 Days Not Complete  Not Complete comments pt not ready to [place] or [place] Not Complete  HRI or Home Care Consult comments pending need  Social Work Consult for Socastee Planning/Counseling Not Complete  SW consult not completed comments pending need   Palliative Care Screening Not Complete  Palliative Care Screening Not Complete Comments pending need  Medication Review (RN Transport planner) Referral to Pharmacy  Some recent data might be hidden

## 2019-06-26 ENCOUNTER — Encounter: Payer: Self-pay | Admitting: Internal Medicine

## 2019-06-26 LAB — GLUCOSE, CAPILLARY
Glucose-Capillary: 109 mg/dL — ABNORMAL HIGH (ref 70–99)
Glucose-Capillary: 152 mg/dL — ABNORMAL HIGH (ref 70–99)
Glucose-Capillary: 142 mg/dL — ABNORMAL HIGH (ref 70–99)
Glucose-Capillary: 147 mg/dL — ABNORMAL HIGH (ref 70–99)
Glucose-Capillary: 160 mg/dL — ABNORMAL HIGH (ref 70–99)
Glucose-Capillary: 220 mg/dL — ABNORMAL HIGH (ref 70–99)

## 2019-06-26 LAB — BASIC METABOLIC PANEL
Anion gap: 11 (ref 5–15)
BUN: 67 mg/dL — ABNORMAL HIGH (ref 8–23)
CO2: 32 mmol/L (ref 22–32)
Calcium: 8.7 mg/dL — ABNORMAL LOW (ref 8.9–10.3)
Chloride: 102 mmol/L (ref 98–111)
Creatinine, Ser: 1.58 mg/dL — ABNORMAL HIGH (ref 0.44–1.00)
GFR calc Af Amer: 37 mL/min — ABNORMAL LOW (ref >60)
GFR calc non Af Amer: 32 mL/min — ABNORMAL LOW (ref >60)
Glucose, Bld: 174 mg/dL — ABNORMAL HIGH (ref 70–99)
Potassium: 3.8 mmol/L (ref 3.5–5.1)
Sodium: 145 mmol/L (ref 135–145)

## 2019-06-26 LAB — CBC
HCT: 26.9 % — ABNORMAL LOW (ref 36.0–46.0)
Hemoglobin: 7.9 g/dL — ABNORMAL LOW (ref 12.0–15.0)
MCH: 27.9 pg (ref 26.0–34.0)
MCHC: 29.4 g/dL — ABNORMAL LOW (ref 30.0–36.0)
MCV: 95.1 fL (ref 80.0–100.0)
Platelets: 328 10*3/uL (ref 150–400)
RBC: 2.83 MIL/uL — ABNORMAL LOW (ref 3.87–5.11)
RDW: 15.6 % — ABNORMAL HIGH (ref 11.5–15.5)
WBC: 10.4 10*3/uL (ref 4.0–10.5)
nRBC: 0 % (ref 0.0–0.2)

## 2019-06-26 MED ORDER — MAGIC MOUTHWASH
5.0000 mL | Freq: Three times a day (TID) | ORAL | Status: DC | PRN
  Filled 2019-06-26: qty 10

## 2019-06-26 NOTE — Evaluation (Signed)
Physical Therapy Evaluation Patient Details Name: VALREE VAUGHT MRN: [id number] DOB: 1946-02-15 Today's Date: 06/26/2019   History of Present Illness  73 year old female admitted with acute on chronic respiratory failure. She was just discharged 12/15, positive for COVID.  PMH:  DM, COPD, on 2 liters at baseline, HTN, CKD  Clinical Impression  Patient received in bed, pleasant, reports she is feeling better since she got here. Agrees to PT assessment. She performed bed mobility with use of rail and increased time, transfers with supervision. Patient ambulated 40 feet without ad, 1 person hand held assist and patient reaching for furniture/walls to steady. She will benefit from use of RW due to weakness. Patient will continue to benefit from skilled PT while here to improve strength and independence with functional mobility.      Follow Up Recommendations Home health PT;Supervision - Intermittent    Equipment Recommendations  None recommended by PT    Recommendations for Other Services       Precautions / Restrictions Precautions Precautions: Fall Precaution Comments: mod fall Restrictions Weight Bearing Restrictions: No      Mobility  Bed Mobility Overal bed mobility: Modified Independent             General bed mobility comments: independent with increased time and effort  Transfers   Equipment used: None                Ambulation/Gait Ambulation/Gait assistance: Min guard Gait Distance (Feet): 40 Feet Assistive device: 1 person hand held assist Gait Pattern/deviations: Step-through pattern;Decreased stride length Gait velocity: decreased   General Gait Details: patient is slightly unsteady and weak with ambulation. Patient holding to walls with ambulation.  Stairs            Wheelchair Mobility    Modified Rankin (Stroke Patients Only)       Balance Overall balance assessment: Needs assistance Sitting-balance support: Feet  supported Sitting balance-Leahy Scale: Good     Standing balance support: Single extremity supported;During functional activity Standing balance-Leahy Scale: Fair Standing balance comment: decreased balance, would benefit from RW                             Pertinent Vitals/Pain Pain Assessment: No/denies pain    Home Living Family/patient expects to be discharged to:: Private residence Living Arrangements: Children Available Help at Discharge: Family           Home Equipment: Bedside commode;Toilet riser;Shower seat;Walker - 4 wheels Additional Comments: patient reports she is going home with daughter at discharge    Prior Function Level of Independence: Independent               Hand Dominance        Extremity/Trunk Assessment   Upper Extremity Assessment Upper Extremity Assessment: Generalized weakness    Lower Extremity Assessment Lower Extremity Assessment: Generalized weakness    Cervical / Trunk Assessment Cervical / Trunk Assessment: Normal  Communication   Communication: No difficulties  Cognition Arousal/Alertness: Awake/alert Behavior During Therapy: WFL for tasks assessed/performed Overall Cognitive Status: Within Functional Limits for tasks assessed                                        General Comments      Exercises     Assessment/Plan    PT Assessment Patient needs continued PT  services  PT Problem List Decreased strength;Decreased mobility;Decreased activity tolerance;Decreased balance       PT Treatment Interventions Therapeutic exercise;Gait training;Balance training;Functional mobility training;Therapeutic activities;Patient/family education    PT Goals (Current goals can be found in the Care Plan section)  Acute Rehab PT Goals Patient Stated Goal: to return home with daughter PT Goal Formulation: With patient Time For Goal Achievement: 07/10/19 Potential to Achieve Goals: Good     Frequency Min 2X/week   Barriers to discharge        Co-evaluation               AM-PAC PT "6 Clicks" Mobility  Outcome Measure Help needed turning from your back to your side while in a flat bed without using bedrails?: None Help needed moving from lying on your back to sitting on the side of a flat bed without using bedrails?: A Little Help needed moving to and from a bed to a chair (including a wheelchair)?: A Little Help needed standing up from a chair using your arms (e.g., wheelchair or bedside chair)?: A Little Help needed to walk in hospital room?: A Little Help needed climbing 3-5 steps with a railing? : A Lot 6 Click Score: 18    End of Session Equipment Utilized During Treatment: Gait belt Activity Tolerance: Patient limited by fatigue Patient left: in chair;with call bell/phone within reach Nurse Communication: Mobility status PT Visit Diagnosis: Unsteadiness on feet (R26.81);Difficulty in walking, not elsewhere classified (R26.2);Muscle weakness (generalized) (M62.81)    Time: [id number] PT Time Calculation (min) (ACUTE ONLY): 29 min   Charges:   PT Evaluation $PT Eval Moderate Complexity: 1 Mod PT Treatments $Gait Training: 8-22 mins        Cieanna Stormes, PT, GCS 06/26/19,11:49 AM

## 2019-06-26 NOTE — Progress Notes (Signed)
[facility] Encounter Note  Patient: Andrea [name] / Admit Date: 06/22/2019 / Date of Encounter: 06/26/2019, 8:27 AM   Subjective: Patient having no apparently significant new changes in condition no evidence of congestive heart failure and/or anginal symptoms  Review of Systems:   Objective: Telemetry: Normal sinus rhythm Physical Exam: Blood pressure (!) 129/52, pulse 72, temperature 97.6 F (36.4 C), temperature source Oral, resp. rate 18, height 5\' 2"  (1.575 m), weight 68.8 kg, SpO2 98 %. Body mass index is 27.74 kg/m.   As per prime doc t.   Intake/Output Summary (Last 24 hours) at 06/26/2019 0827 Last data filed at 06/26/2019 0500 Gross per 24 hour  Intake 1087.21 ml  Output 300 ml  Net 787.21 ml    Inpatient Medications:  . apixaban  5 mg Oral BID  . atorvastatin  40 mg Oral q1800  . budesonide  1 puff Inhalation BID  . furosemide  40 mg Intravenous Daily  . insulin aspart  0-20 Units Subcutaneous Q4H  . insulin aspart  8 Units Subcutaneous TID WC  . insulin glargine  10 Units Subcutaneous Daily  . methylPREDNISolone (SOLU-MEDROL) injection  60 mg Intravenous Q12H  . metoprolol succinate  25 mg Oral Daily  . pantoprazole  20 mg Oral Daily  . sodium chloride flush  3 mL Intravenous Q12H  . tiotropium  18 mcg Inhalation Daily   Infusions:  . sodium chloride Stopped (06/25/19 0134)  . azithromycin Stopped (06/25/19 2246)  . cefTRIAXone (ROCEPHIN)  IV Stopped (06/26/19 0152)    Labs: Recent Labs    06/25/19 0349 06/25/19 0944 06/26/19 0631  NA 144  --  145  K 5.2* 4.9 3.8  CL 104  --  102  CO2 31  --  32  GLUCOSE 137*  --  174*  BUN 60*  --  67*  CREATININE 1.66*  --  1.58*  CALCIUM 8.8*  --  8.7*   No results for input(s): AST, ALT, ALKPHOS, BILITOT, PROT, ALBUMIN in the last 72 hours. Recent Labs    06/25/19 0724 06/26/19 0631  WBC 10.3 10.4  HGB 8.0* 7.9*  HCT 26.5* 26.9*  MCV 95.0 95.1  PLT 306 328   No results  for input(s): CKTOTAL, CKMB, TROPONINI in the last 72 hours. Invalid input(s): POCBNP No results for input(s): HGBA1C in the last 72 hours.   Weights: Filed Weights   06/22/19 2305 06/23/19 2054 06/24/19 0316  Weight: 68.5 kg 68.8 kg 68.8 kg     Radiology/Studies:  DG Chest Port 1 View  Result Date: 06/25/2019 CLINICAL DATA:  Pneumonia. EXAM: PORTABLE CHEST 1 VIEW COMPARISON:  June 22, 2019. FINDINGS: Stable cardiomediastinal silhouette. No pneumothorax is noted. Stable bilateral lung opacities are noted, left greater than right, concerning for multifocal pneumonia and probable associated small pleural effusions. Bony thorax is unremarkable. IMPRESSION: Stable bilateral lung opacities are noted, left greater than right, concerning for multifocal pneumonia and associated small pleural effusions. Electronically Signed   By: Marijo Conception M.D.   On: 06/25/2019 07:59   DG Chest Portable 1 View  Result Date: 06/22/2019 CLINICAL DATA:  Shortness of breath EXAM: PORTABLE CHEST 1 VIEW COMPARISON:  May 31, 2019 FINDINGS: The cardiomediastinal silhouette is obscured. Overlying median sternotomy wires. Large area of patchy consolidation seen throughout the left mid and lower lung. There is increased interstitial markings seen in the left upper lung. Small amount of hazy airspace opacity at the right lung base. There is a blunting  of the bilateral costophrenic angles, likely small bilateral pleural effusions. No acute osseous abnormality. IMPRESSION: Multifocal patchy airspace consolidation seen predominantly throughout the left lung and right lung base. This is likely due to multifocal pneumonia. Small bilateral pleural effusions. Electronically Signed   By: Prudencio Pair M.D.   On: 06/22/2019 23:47   DG Chest Portable 1 View  Result Date: 05/31/2019 CLINICAL DATA:  Shortness of breath, cough, headache. Diagnosed with COVID on 11/20. EXAM: PORTABLE CHEST 1 VIEW COMPARISON:  05/20/2019 FINDINGS:  Mild patchy opacity in the lingula and bilateral lower lobes. No pleural effusion or pneumothorax. The heart is normal in size. Postsurgical changes related to prior CABG. Left vascular stent. Median sternotomy. Surgical clips overlying the right breast/lateral chest wall. IMPRESSION: Mild patchy opacities in the lingula and bilateral lower lobes, suspicious for pneumonia. Electronically Signed   By: Julian Hy M.D.   On: 05/31/2019 11:24   ECHOCARDIOGRAM COMPLETE  Result Date: 06/23/2019   ECHOCARDIOGRAM REPORT   Patient Name:   Andrea [name] Date of Exam: 06/23/2019 Medical Rec #:  [id number]        Height:       62.0 in Accession #:    [id number]       Weight:       151.0 lb Date of Birth:  01-19-1946        BSA:          1.70 m Patient Age:    73 years         BP:           138/59 mmHg Patient Gender: F                HR:           90 bpm. Exam Location:  [facility] Procedure: 2D Echo, Cardiac Doppler and Color Doppler Indications:     Abnormal ECG 794.31  History:         Patient has no prior history of Echocardiogram examinations.                  Prior CABG, COPD; Risk Factors:Diabetes. Heart attack.  Sonographer:     Sherrie Sport RDCS (AE) Referring Phys:  [id number] Athena Masse Diagnosing Phys: Nelva Bush MD  Sonographer Comments: CABG surgical scar. IMPRESSIONS  1. Left ventricular ejection fraction, by visual estimation, is 40 to 45%. The left ventricle has moderately decreased function. There is mildly increased left ventricular hypertrophy.  2. Moderate hypokinesis of the left ventricular, entire anteroseptal wall and anterior wall.  3. Elevated left atrial pressure.  4. Left ventricular diastolic parameters are consistent with Grade II diastolic dysfunction (pseudonormalization).  5. The left ventricle demonstrates regional wall motion abnormalities.  6. Global right ventricle has normal systolic function.The right ventricular size is normal. No increase in right ventricular wall thickness.   7. Left atrial size was normal.  8. Right atrial size was normal.  9. Mild mitral annular calcification. 10. The mitral valve is degenerative. Trivial mitral valve regurgitation. No evidence of mitral stenosis. 11. The tricuspid valve is not well visualized. 12. The aortic valve was not well visualized. Aortic valve regurgitation is not visualized. Mild aortic valve sclerosis without stenosis. 13. The pulmonic valve was not well visualized. Pulmonic valve regurgitation is not visualized. 14. Moderately elevated pulmonary artery systolic pressure. 15. The inferior vena cava is normal in size with greater than 50% respiratory variability, suggesting right atrial pressure of 3 mmHg. FINDINGS  Left Ventricle:  Left ventricular ejection fraction, by visual estimation, is 40 to 45%. The left ventricle has moderately decreased function. Moderate hypokinesis of the left ventricular, entire anteroseptal wall and anterior wall. The left ventricle demonstrates regional wall motion abnormalities. The left ventricular internal cavity size was the left ventricle is normal in size. There is mildly increased left ventricular hypertrophy. Left ventricular diastolic parameters are consistent with Grade II diastolic dysfunction (pseudonormalization). Elevated left atrial pressure. Right Ventricle: The right ventricular size is normal. No increase in right ventricular wall thickness. Global RV systolic function is has normal systolic function. The tricuspid regurgitant velocity is 3.12 m/s, and with an assumed right atrial pressure  of 3 mmHg, the estimated right ventricular systolic pressure is moderately elevated at 41.9 mmHg. Left Atrium: Left atrial size was normal in size. Right Atrium: Right atrial size was normal in size Pericardium: There is no evidence of pericardial effusion. Mitral Valve: The mitral valve is degenerative in appearance. There is mild thickening of the mitral valve leaflet(s). Mild mitral annular calcification.  Trivial mitral valve regurgitation. No evidence of mitral valve stenosis by observation. Tricuspid Valve: The tricuspid valve is not well visualized. Tricuspid valve regurgitation is mild. Aortic Valve: The aortic valve was not well visualized. Aortic valve regurgitation is not visualized. Mild aortic valve sclerosis is present, with no evidence of aortic valve stenosis. Aortic valve mean gradient measures 4.0 mmHg. Aortic valve peak gradient measures 6.1 mmHg. Aortic valve area, by VTI measures 1.51 cm. Pulmonic Valve: The pulmonic valve was not well visualized. Pulmonic valve regurgitation is not visualized. Pulmonic regurgitation is not visualized. No evidence of pulmonic stenosis. Aorta: The aortic root is normal in size and structure. Pulmonary Artery: The pulmonary artery is not well seen. Venous: The inferior vena cava is normal in size with greater than 50% respiratory variability, suggesting right atrial pressure of 3 mmHg. IAS/Shunts: No atrial level shunt detected by color flow Doppler.  LEFT VENTRICLE PLAX 2D LVIDd:         4.87 cm       Diastology LVIDs:         3.80 cm       LV e' lateral:   8.38 cm/s LV PW:         1.10 cm       LV E/e' lateral: 10.8 LV IVS:        0.66 cm       LV e' medial:    2.83 cm/s LVOT diam:     2.00 cm       LV E/e' medial:  31.9 LV SV:         49 ml LV SV Index:   28.14 LVOT Area:     3.14 cm  LV Volumes (MOD) LV area d, A2C:    23.80 cm LV area d, A4C:    22.80 cm LV area s, A2C:    18.00 cm LV area s, A4C:    16.10 cm LV major d, A2C:   6.22 cm LV major d, A4C:   6.17 cm LV major s, A2C:   5.94 cm LV major s, A4C:   5.34 cm LV vol d, MOD A2C: 75.8 ml LV vol d, MOD A4C: 69.0 ml LV vol s, MOD A2C: 47.7 ml LV vol s, MOD A4C: 41.2 ml LV SV MOD A2C:     28.1 ml LV SV MOD A4C:     69.0 ml LV SV MOD BP:      25.8 ml  RIGHT VENTRICLE RV Basal diam:  2.55 cm RV S prime:     10.10 cm/s TAPSE (M-mode): 3.7 cm LEFT ATRIUM             Index       RIGHT ATRIUM           Index LA  diam:        3.90 cm 2.30 cm/m  RA Area:     15.40 cm LA Vol (A2C):   40.3 ml 23.76 ml/m RA Volume:   38.40 ml  22.64 ml/m LA Vol (A4C):   36.6 ml 21.57 ml/m LA Biplane Vol: 41.6 ml 24.52 ml/m  AORTIC VALVE                   PULMONIC VALVE AV Area (Vmax):    1.41 cm    PV Vmax:        0.75 m/s AV Area (Vmean):   1.27 cm    PV Peak grad:   2.2 mmHg AV Area (VTI):     1.51 cm    RVOT Peak grad: 3 mmHg AV Vmax:           123.67 cm/s AV Vmean:          95.567 cm/s AV VTI:            0.298 m AV Peak Grad:      6.1 mmHg AV Mean Grad:      4.0 mmHg LVOT Vmax:         55.40 cm/s LVOT Vmean:        38.500 cm/s LVOT VTI:          0.143 m LVOT/AV VTI ratio: 0.48  AORTA Ao Root diam: 2.10 cm MITRAL VALVE                        TRICUSPID VALVE MV Area (PHT): 4.68 cm             TR Peak grad:   38.9 mmHg MV PHT:        46.98 msec           TR Vmax:        312.00 cm/s MV Decel Time: 162 msec MV E velocity: 90.20 cm/s 103 cm/s  SHUNTS MV A velocity: 60.10 cm/s 70.3 cm/s Systemic VTI:  0.14 m MV E/A ratio:  1.50       1.5       Systemic Diam: 2.00 cm  Nelva Bush MD Electronically signed by Nelva Bush MD Signature Date/Time: 06/23/2019/9:56:58 AM    Final      Assessment and Recommendation  73 y.o. female with known coronary disease status post coronary to bypass graft and mild LV systolic dysfunction chronic kidney disease with acute Covid respiratory infection having elevated troponin consistent with demand ischemia rather than acute coronary syndrome and acute on chronic systolic dysfunction congestive heart failure due to respiratory failure hypoxia and LV dysfunction on appropriate medication management 1.  Continue aggressive therapy for acute on chronic obstructive pulmonary disease and Covid infection 2.  No further cardiac intervention at this time due to no evidence of acute coronary syndrome 3.  Continue metoprolol for further risk reduction cardiomyopathy 4.  Furosemide for pulmonary edema  watching closely for chronic kidney disease 5.  High intensity cholesterol therapy for further coronary artery disease treatment 6.  No restrictions to rehabilitation 7.  Call if further questions  Signed, Dois Davenport.D.  FACC

## 2019-06-26 NOTE — Care Management Important Message (Signed)
Important Message  Patient Details  Name: GRACESON STECKLEIN MRN: [id number] Date of Birth: July 22, 1945   Medicare Important Message Given:  Yes  Reviewed verbally over room phone with patient.  Requested copy be sent to her email address on file: [email or web address].  Copy of Medicare IM sent securely to email address provided.     Dannette Barbara 06/26/2019, 11:43 AM

## 2019-06-26 NOTE — TOC Initial Note (Signed)
Transition of Care [facility]) - Initial/Assessment Note    Patient Details  Name: Andrea [name] MRN: [id number] Date of Birth: [birth year]  [facility] CM/SW Contact:    Andrea Sessions, RN Phone Number: 06/26/2019, 10:00 AM  Clinical Narrative:                 Patient assessed via phone due to covid pandemic   [facility] consult for SNF placement  Patient states that she is not interested in pursuing snf at discharge and will be discharging to her daughters home at discharge  Patient open with [facility] for PT/OT.  Patient would like to continue services at discharge.  Andrea [name] with [facility] notified of admission, and update that patient would be discharging to [address]. Andrea [name], Andrea [name]  Patient states that she has a RW, cane, shower chair and O2 in the home.  Patient is interested in switching O2 companies  With patient's permission RNCM reached out to daughter Andrea [name] to discuss discharge plan.  Andrea [name] confirms patient will be discharging to her home.  Daughter and granddaughter will be providing 24 hour care.  Family will be transporting to appointments.   Daughter to call local pulmonology and PCP offices to switch patient from her current providers at [facility].   Discussed with daughter about switching oxygen companies.  She is agreeable for RNCM to reach out to [employer] to see if patient is eligible to switch companies.  Andrea [name] with [employer] notified.   Expected Discharge Plan: [place]     Patient Goals and CMS Choice        Expected Discharge Plan and Services Expected Discharge Plan: [place]   Discharge Planning Services: CM Consult Post Acute Care Choice: Resumption of Svcs/PTA Provider Living arrangements for the past 2 months: [place]: [place] Date [place]: 06/25/19   Representative spoke with at [place]: Andrea [name]  Prior Living  Arrangements/Services Living arrangements for the past 2 months: Single Family Home Lives with:: Adult Children Patient language and need for interpreter reviewed:: Yes Do you feel safe going back to the place where you live?: Yes      Need for Family Participation in Patient Care: Yes (Comment) Care giver support system in place?: Yes (comment) Current home services: DME, Home PT, Home OT Criminal Activity/Legal Involvement Pertinent to Current Situation/Hospitalization: No - Comment as needed  Activities of Daily Living Home Assistive Devices/Equipment: Oxygen, Shower chair with back ADL Screening (condition at time of admission) Patient's cognitive ability adequate to safely complete daily activities?: Yes Is the patient deaf or have difficulty hearing?: Yes Does the patient have difficulty seeing, even when wearing glasses/contacts?: No Does the patient have difficulty concentrating, remembering, or making decisions?: No Patient able to express need for assistance with ADLs?: Yes Does the patient have difficulty dressing or bathing?: No Independently performs ADLs?: Yes (appropriate for developmental age) Does the patient have difficulty walking or climbing stairs?: Yes(O2 at baseline) Weakness of Legs: Both Weakness of Arms/Hands: None  Permission Sought/Granted                  Emotional Assessment   Attitude/Demeanor/Rapport: Gracious Affect (typically observed): Accepting Orientation: : Oriented to Self, Oriented to Place, Oriented to  Time, Oriented to Situation   Psych Involvement: No (comment)  Admission diagnosis:  Shortness of breath [R06.02] Hypoxia [R09.02] COPD exacerbation (HCC) [J44.1] Multifocal pneumonia [J18.9] Acute on chronic congestive heart failure, unspecified heart failure type ([place]) [I50.9] Pneumonia due to COVID-19 virus [U07.1, J12.89] Patient Active Problem List   Diagnosis Date Noted  . Acute on chronic respiratory failure with hypoxia  ([place]) 06/23/2019  . History of 2019 novel coronavirus disease (COVID-19) 06/23/2019  . Multifocal pneumonia 06/23/2019  . Acute CHF (congestive heart failure) ([place]) 06/23/2019  . COPD with chronic bronchitis ([place]) 06/23/2019  . Non-insulin dependent type 2 diabetes mellitus ([place]) 06/23/2019  . Chronic anticoagulation 06/23/2019  . HCAP (healthcare-associated pneumonia) 05/31/2019  . Pancytopenia ([place]) 05/17/2019  . Sepsis ([place]) 05/16/2019  . Fever 05/16/2019  . Pleural effusion with transudate 09/09/2018  . Pulmonary embolism on right ([place]) 09/09/2018  . DOE (dyspnea on exertion) 08/08/2018  . 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 Acute bronchitis with chronic obstructive pulmonary disease (COPD) ([place]) 11/15/2017  . Pneumonia 09/22/2017  . H/O solitary pulmonary nodule 04/14/2014  . Chronic right hip pain 09/01/2013  . Peptic ulcer symptoms 04/04/2013  . GERD (gastroesophageal reflux disease) 10/22/2012  . Atherosclerotic heart disease of native coronary artery without angina pectoris 10/11/2012  . Malignant neoplasm of female breast ([place]) 02/16/2011  . H/O TIA (transient ischemic attack) and stroke 11/01/2009  . CKD (chronic kidney disease) stage 3, GFR 30-59 ml/min 02/23/2009  . Hypertension, benign 11/26/2008   PCP:  Andrea Holstein, MD Pharmacy:   [place], [place] - [facility] [address] [place] [address] Phone: [phone] Fax: [phone]     Social Determinants of Health (SDOH) Interventions    Readmission Risk Interventions Readmission Risk Prevention Plan 06/26/2019 06/02/2019  Transportation Screening Complete Complete  PCP or Specialist Appt within 3-5 Days - Not Complete  Not Complete comments - pt not ready to [place] or [place] - Not Complete  HRI or Home Care Consult comments - pending need  Social Work Consult for [place] Planning/Counseling - Not Complete  SW consult not completed comments - pending  need  Palliative Care Screening - Not Complete  Palliative Care Screening Not Complete Comments - pending need  Medication Review (RN Care Manager) Complete Referral to Pharmacy  [place] or Home Care Consult Complete -  [place] Patient Refused -  Some recent data might be hidden

## 2019-06-26 NOTE — Progress Notes (Signed)
PROGRESS NOTE    Andrea [name]  [id number] DOB: 02/22/1946 DOA: 06/22/2019 PCP: Chester Holstein, MD       Assessment & Plan:   Principal Problem:   Acute on chronic respiratory failure with hypoxia [facility]) Active Problems:   CKD (chronic kidney disease) stage 3, GFR 30-59 ml/min   History of 2019 novel coronavirus disease (COVID-19)   Multifocal pneumonia   Acute CHF (congestive heart failure) (HCC)   COPD with chronic bronchitis (HCC)   Non-insulin dependent type 2 diabetes mellitus (HCC)   Chronic anticoagulation   Acute on chronic respiratory failure with hypoxia: improved today. Possibly multifactorial and related to worsening Covid related multifocal pneumonia, COPD exacerbation & CHF exacerbation. Repeat COVID19 positive. Continue airborne & contact precautions. Continue on supplemental oxygen and currently on 2.5L [place] which better than baseline  Persistent multifocal pneumonia: bacterial (possible gram positive or neg) and/or viral (COVID19 positive). Continue on Rocephin and azithromycin for possible bacterial component. Continue on bronchodilators. Will continue on IV steroids. Pt previously received the complete course of remdesivir so will not order. Encourage incentive spirometry. 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 Continue on IV Lasix, metoprolol. Monitor I/Os.  Not currently on ACE inhibitor likely because of chronic kidney disease.  Elevated troponin: in patient with history of CAD s/p CABG. Likely secondary to demand ischemia. Cardio following and recs apprec  CKDIIb: Cr is trending down today. Avoid nephrotoxic meds. Will continue to monitor   Hyperkalemia: resolved  COPD exacerbation: continue on abxs & bronchodilators. Will continue IV steroids. Continue on supplemental oxygen and back to baseline   DM2: poorly controlled. Will continue to  hold home oral hypoglycemics. 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 No need for a transfusion at this time. Will continue to monitor   Chronic anticoagulation: continue on eliquis  Generalized weakness: PT recs home health PT   DVT prophylaxis: eliquis Code Status: full  Family Communication: Disposition Plan:.   Consultants:   Cardio    Procedures: n/a   Antimicrobials: azithromycin, rocephin    Subjective: Pt c/o shortness of breath but slightly improved from day prior. Objective: Vitals:   06/25/19 0632 06/25/19 1221 06/25/19 2000 06/26/19 0500  BP: (!) 102/58 (!) 123/57 135/62 (!) 129/52  Pulse: 72 80 80 72  Resp: 20  18 18   Temp: 97.6 F (36.4 C) 97.8 F (36.6 C) 98.2 F (36.8 C) 97.6 F (36.4 C)  TempSrc: Oral Oral Oral Oral  SpO2: 97% 99% 99% 98%  Weight:      Height:        Intake/Output Summary (Last 24 hours) at 06/26/2019 0806 Last data filed at 06/26/2019 0500 Gross per 24 hour  Intake 1087.21 ml  Output 300 ml  Net 787.21 ml   Filed Weights   06/22/19 2305 06/23/19 2054 06/24/19 0316  Weight: 68.5 kg 68.8 kg 68.8 kg    Examination:  General exam: Appears calm but uncomfortable  Respiratory system: severely decreased breath sounds b/l. No wheezes Cardiovascular system: S1 & S2 normal. No rubs, gallops or clicks.  Gastrointestinal system: Abdomen is nondistended, soft and nontender. Normal bowel sounds heard. Central nervous system: Alert and oriented. Moves all 4 extremities Psychiatry: Judgement and insight appear normal. Normal mood and affect    Data Reviewed: I have personally reviewed following labs and imaging  studies  CBC: Recent Labs  Lab 06/22/19 2324 06/23/19 1223 06/24/19 0558 06/25/19 0724 06/26/19 0631  WBC 9.9 6.6 9.7 10.3 10.4  HGB 8.7* 7.9* 7.5* 8.0* 7.9*  HCT 28.4* 25.0* 23.4* 26.5* 26.9*  MCV 93.4 89.3 88.6 95.0 95.1  PLT 267 238 276 306 328     Basic Metabolic Panel: Recent Labs  Lab 06/22/19 2324 06/23/19 1223 06/24/19 0558 06/25/19 0349 06/25/19 0944 06/26/19 0631  NA 135 135 141 144  --  145  K 4.3 3.8 3.8 5.2* 4.9 3.8  CL 95* 94* 100 104  --  102  CO2 27 27 29 31   --  32  GLUCOSE 274* 522* 168* 137*  --  174*  BUN 33* 42* 50* 60*  --  67*  CREATININE 1.70* 1.97* 1.76* 1.66*  --  1.58*  CALCIUM 8.5* 8.5* 8.6* 8.8*  --  8.7*   GFR: Estimated Creatinine Clearance: 29.3 mL/min (A) (by C-G formula based on SCr of 1.58 mg/dL (H)). Liver Function Tests: Recent Labs  Lab 06/22/19 2324  AST 24  ALT 19  ALKPHOS 77  BILITOT 0.7  PROT 7.2  ALBUMIN 2.5*   No results for input(s): LIPASE, AMYLASE in the last 168 hours. No results for input(s): AMMONIA in the last 168 hours. Coagulation Profile: No results for input(s): INR, PROTIME in the last 168 hours. Cardiac Enzymes: No results for input(s): CKTOTAL, CKMB, CKMBINDEX, TROPONINI in the last 168 hours. BNP (last 3 results) No results for input(s): PROBNP in the last 8760 hours. HbA1C: No results for input(s): HGBA1C in the last 72 hours. CBG: Recent Labs  Lab 06/25/19 1103 06/25/19 1637 06/25/19 2031 06/26/19 0111 06/26/19 0502  GLUCAP 240* 230* 265* 109* 152*   Lipid Profile: No results for input(s): CHOL, HDL, LDLCALC, TRIG, CHOLHDL, LDLDIRECT in the last 72 hours. Thyroid Function Tests: No results for input(s): TSH, T4TOTAL, FREET4, T3FREE, THYROIDAB in the last 72 hours. Anemia Panel: No results for input(s): VITAMINB12, FOLATE, FERRITIN, TIBC, IRON, RETICCTPCT in the last 72 hours. Sepsis Labs: Recent Labs  Lab 06/22/19 2313 06/22/19 2324  PROCALCITON  --  0.23  LATICACIDVEN 1.3  --     Recent Results (from the past 240 hour(s))  Blood culture (routine x 2)     Status: None (Preliminary result)   Collection Time: 06/22/19 11:12 PM   Specimen: BLOOD  Result Value Ref Range Status   Specimen Description BLOOD PERIPHERAL IV  Final    Special Requests   Final    BOTTLES DRAWN AEROBIC AND ANAEROBIC Blood Culture adequate volume   Culture   Final    NO GROWTH 4 DAYS Performed at [facility], [address], [place], [place] [address]    Report Status PENDING  Incomplete  Blood culture (routine x 2)     Status: None (Preliminary result)   Collection Time: 06/22/19 11:24 PM   Specimen: BLOOD  Result Value Ref Range Status   Specimen Description BLOOD LEFT ARM  Final   Special Requests   Final    BOTTLES DRAWN AEROBIC AND ANAEROBIC Blood Culture adequate volume   Culture   Final    NO GROWTH 4 DAYS Performed at [facility], [address] [place],  [address]    Report Status PENDING  Incomplete  Respiratory Panel by PCR     Status: None   Collection Time: 06/23/19  7:46 AM   Specimen: Nasopharyngeal Swab; Respiratory  Result Value Ref Range Status  Adenovirus NOT DETECTED NOT DETECTED Final   Coronavirus 229E NOT DETECTED NOT DETECTED Final    Comment: (NOTE) The Coronavirus on the Respiratory Panel, DOES NOT test for the novel  Coronavirus (2019 nCoV)    Coronavirus HKU1 NOT DETECTED NOT DETECTED Final   Coronavirus NL63 NOT DETECTED NOT DETECTED Final   Coronavirus OC43 NOT DETECTED NOT DETECTED Final   Metapneumovirus NOT DETECTED NOT DETECTED Final   Rhinovirus / Enterovirus NOT DETECTED NOT DETECTED Final   Influenza A NOT DETECTED NOT DETECTED Final   Influenza B NOT DETECTED NOT DETECTED Final   Parainfluenza Virus 1 NOT DETECTED NOT DETECTED Final   Parainfluenza Virus 2 NOT DETECTED NOT DETECTED Final   Parainfluenza Virus 3 NOT DETECTED NOT DETECTED Final   Parainfluenza Virus 4 NOT DETECTED NOT DETECTED Final   Respiratory Syncytial Virus NOT DETECTED NOT DETECTED Final   Bordetella pertussis NOT DETECTED NOT DETECTED Final   Chlamydophila pneumoniae NOT DETECTED NOT DETECTED Final   Mycoplasma pneumoniae NOT DETECTED NOT DETECTED Final    Comment: Performed at [facility], [place] [address], [place], [place] [address]  SARS CORONAVIRUS 2 (TAT 6-24 HRS) Nasopharyngeal Nasopharyngeal Swab     Status: Abnormal   Collection Time: 06/23/19  2:39 PM   Specimen: Nasopharyngeal Swab  Result Value Ref Range Status   SARS Coronavirus 2 POSITIVE (A) NEGATIVE Final    Comment: RESULT CALLED TO, READ BACK BY AND VERIFIED WITH: E.LOUISSAINT RN 2221 06/23/2019 MCCORMICK K (NOTE) SARS-CoV-2 target nucleic acids are DETECTED. The SARS-CoV-2 RNA is generally detectable in upper and lower respiratory specimens during the acute phase of infection. Positive results are indicative of the presence of SARS-CoV-2 RNA. Clinical correlation with patient history and other diagnostic information is  necessary to determine patient infection status. Positive results do not rule out bacterial infection or co-infection with other viruses.  The expected result is Negative. Fact Sheet for Patients: SugarRoll.be Fact Sheet for Healthcare Providers: https://www.woods-mathews.com/ This test is not yet approved or cleared by the [place] FDA and  has been authorized for detection and/or diagnosis of SARS-CoV-2 by FDA under an Emergency Use Authorization (EUA). This EUA will remain  in effect (meaning this test can be used)  for the duration of the COVID-19 declaration under Section 564(b)(1) of the Act, 21 U.S.C. section 360bbb-3(b)(1), unless the authorization is terminated or revoked sooner. Performed at [facility], [place] [address] [place], [place] [address]          Radiology Studies: DG Chest Port 1 View  Result Date: 06/25/2019 CLINICAL DATA:  Pneumonia. EXAM: PORTABLE CHEST 1 VIEW COMPARISON:  June 22, 2019. FINDINGS: Stable cardiomediastinal silhouette. No pneumothorax is noted. Stable bilateral lung opacities are noted, left greater than right, concerning for multifocal pneumonia and probable associated  small pleural effusions. Bony thorax is unremarkable. IMPRESSION: Stable bilateral lung opacities are noted, left greater than right, concerning for multifocal pneumonia and associated small pleural effusions. Electronically Signed   By: Marijo Conception M.D.   On: 06/25/2019 07:59        Scheduled Meds: . apixaban  5 mg Oral BID  . atorvastatin  40 mg Oral q1800  . budesonide  1 puff Inhalation BID  . furosemide  40 mg Intravenous Daily  . insulin aspart  0-20 Units Subcutaneous Q4H  . insulin aspart  8 Units Subcutaneous TID WC  . insulin glargine  10 Units Subcutaneous Daily  . methylPREDNISolone (SOLU-MEDROL) injection  60 mg Intravenous Q12H  . metoprolol succinate  25 mg Oral Daily  . pantoprazole  20 mg Oral Daily  . sodium chloride flush  3 mL Intravenous Q12H  . tiotropium  18 mcg Inhalation Daily   Continuous Infusions: . sodium chloride Stopped (06/25/19 0134)  . azithromycin Stopped (06/25/19 2246)  . cefTRIAXone (ROCEPHIN)  IV Stopped (06/26/19 0152)     LOS: 3 days    Time spent: 30 mins    Wyvonnia Dusky, MD [facility] Pager [phone]  If 7PM-7AM, please contact night-coverage [email or web address] Password [email or web address] 06/26/2019, 8:06 AM

## 2019-06-26 NOTE — Progress Notes (Signed)
Per Dr. Jimmye Norman, discontinued negative pressure in room.

## 2019-06-26 NOTE — Progress Notes (Signed)
Patient desatted to 88% on RA at rest.   Patient suffers from COPD and requires repositioning not feasible in a standard bed. Patient requires head of bed to be elevated greater than 30 degrees or severe shortness of breath occurs.

## 2019-06-27 ENCOUNTER — Encounter: Payer: Self-pay | Admitting: Internal Medicine

## 2019-06-27 LAB — BASIC METABOLIC PANEL
Anion gap: 12 (ref 5–15)
BUN: 70 mg/dL — ABNORMAL HIGH (ref 8–23)
CO2: 31 mmol/L (ref 22–32)
Calcium: 8.7 mg/dL — ABNORMAL LOW (ref 8.9–10.3)
Chloride: 101 mmol/L (ref 98–111)
Creatinine, Ser: 1.49 mg/dL — ABNORMAL HIGH (ref 0.44–1.00)
GFR calc Af Amer: 40 mL/min — ABNORMAL LOW (ref >60)
GFR calc non Af Amer: 35 mL/min — ABNORMAL LOW (ref >60)
Glucose, Bld: 149 mg/dL — ABNORMAL HIGH (ref 70–99)
Potassium: 4.1 mmol/L (ref 3.5–5.1)
Sodium: 144 mmol/L (ref 135–145)

## 2019-06-27 LAB — CULTURE, BLOOD (ROUTINE X 2)
Culture: NO GROWTH
Culture: NO GROWTH
Special Requests: ADEQUATE
Special Requests: ADEQUATE

## 2019-06-27 LAB — CBC
HCT: 25.4 % — ABNORMAL LOW (ref 36.0–46.0)
Hemoglobin: 8.1 g/dL — ABNORMAL LOW (ref 12.0–15.0)
MCH: 28.9 pg (ref 26.0–34.0)
MCHC: 31.9 g/dL (ref 30.0–36.0)
MCV: 90.7 fL (ref 80.0–100.0)
Platelets: 328 10*3/uL (ref 150–400)
RBC: 2.8 MIL/uL — ABNORMAL LOW (ref 3.87–5.11)
RDW: 15.6 % — ABNORMAL HIGH (ref 11.5–15.5)
WBC: 11.1 10*3/uL — ABNORMAL HIGH (ref 4.0–10.5)
nRBC: 0 % (ref 0.0–0.2)

## 2019-06-27 LAB — GLUCOSE, CAPILLARY
Glucose-Capillary: 199 mg/dL — ABNORMAL HIGH (ref 70–99)
Glucose-Capillary: 321 mg/dL — ABNORMAL HIGH (ref 70–99)
Glucose-Capillary: 192 mg/dL — ABNORMAL HIGH (ref 70–99)
Glucose-Capillary: 154 mg/dL — ABNORMAL HIGH (ref 70–99)

## 2019-06-27 MED ORDER — INSULIN ASPART 100 UNIT/ML ~~LOC~~ SOLN
0.0000 [IU] | Freq: Three times a day (TID) | SUBCUTANEOUS | Status: DC
  Administered 2019-06-27 (×2): 4 [IU] via SUBCUTANEOUS
  Administered 2019-06-27: 15 [IU] via SUBCUTANEOUS
  Administered 2019-06-27: 4 [IU] via SUBCUTANEOUS
  Administered 2019-06-28: 15 [IU] via SUBCUTANEOUS
  Administered 2019-06-28: 4 [IU] via SUBCUTANEOUS
  Administered 2019-06-28: 7 [IU] via SUBCUTANEOUS
  Administered 2019-06-28: 11 [IU] via SUBCUTANEOUS
  Administered 2019-06-29: 15 [IU] via SUBCUTANEOUS
  Administered 2019-06-29: 7 [IU] via SUBCUTANEOUS
  Filled 2019-06-27 (×10): qty 1

## 2019-06-27 NOTE — Evaluation (Signed)
Occupational Therapy Evaluation Patient Details Name: Andrea [name] MRN: [id number] DOB: Feb 01, 1946 Today's Date: 06/27/2019    History of Present Illness 74 year old female admitted with acute on chronic respiratory failure. She was just discharged 12/15, positive for COVID.  PMH:  DM, COPD, on 2 liters at baseline, HTN, CKD   Clinical Impression   Andrea [name] was seen for OT evaluation this date. Prior to initial Pt reports she was independent in all BADLs and mobility, living alone in a 1 level home with an elevator up to her 7th floor apartment. Pt on 2 liters of O2 at baseline. Pt reports becoming easily fatigued or out of breath with minimal exertion, since COVID diagnosis in December. She states she was receiving HHOT/PT/RN services. She continues to be functionally limited by decreased activity tolerance, shortness of breath, and generalized weakness. Pt currently requires minimal assist for heavy ADL tasks such as lower body dressing and bathing as well as supervision assist for functional mobility of distances greater than ~10 feet. Pt educated in energy conservation conservation strategies including pursed lip breathing, activity pacing, home/routines modifications, work simplification, AE/DME, prioritizing of meaningful occupations, and falls prevention. Pt verbalized understanding but would benefit from additional skilled OT services to maximize recall and carryover of learned techniques and facilitate implementation of learned techniques into daily routines. Upon discharge, recommend [facility] services.       Follow Up Recommendations  Home health OT    Equipment Recommendations  None recommended by OT(Pt has necessary equipment.)    Recommendations for Other Services       Precautions / Restrictions Precautions Precautions: Fall Precaution Comments: mod fall Restrictions Weight Bearing Restrictions: No      Mobility Bed Mobility Overal bed mobility: Modified Independent              General bed mobility comments: independent with increased time and effort  Transfers Overall transfer level: Independent Equipment used: None             General transfer comment: Pt performs multiple STS transfers in room w/o assist from this therapist. Mild increased time/effort to perform but overall good safety awareness and technique.    Balance Overall balance assessment: Needs assistance Sitting-balance support: Feet supported Sitting balance-Leahy Scale: Good Sitting balance - Comments: Steady static and dynamic sitting during functional activity.   Standing balance support: Single extremity supported;During functional activity Standing balance-Leahy Scale: Fair                             ADL either performed or assessed with clinical judgement   ADL Overall ADL's : Needs assistance/impaired                                       General ADL Comments: Pt requires supervision for safety during heavy ADL tasks such as LB dressing and functional mobility for longer distances (>10 feet). has been using BSC in room independently and does so during OT evaluation w/o need for physical assist during transfer or peri-care. OT provides education on ECS during functional tasks including activity pacing and PLB strategies. Pt return demos t/o eval. Would benefit from further reinforcement.     Vision         Perception     Praxis      Pertinent Vitals/Pain Pain Assessment: No/denies pain  Hand Dominance Right   Extremity/Trunk Assessment Upper Extremity Assessment Upper Extremity Assessment: Generalized weakness   Lower Extremity Assessment Lower Extremity Assessment: Generalized weakness   Cervical / Trunk Assessment Cervical / Trunk Assessment: Normal   Communication Communication Communication: No difficulties   Cognition Arousal/Alertness: Awake/alert Behavior During Therapy: WFL for tasks  assessed/performed Overall Cognitive Status: Within Functional Limits for tasks assessed                                 General Comments: Pt pleasant, conversational, follows 1 step VCs consistently.   General Comments  Pt spO2 monitored t/o evaluation and generally remains WFLs (>92%) with pt on 2.5L [place]. Pt noted to drop to 89% on one occasion after engaging in ROM/MMT, but rebounded to 92% quickly with cues for PLB. Pt left on 2.5 L [place]. RN notified.    Exercises Other Exercises Other Exercises: Pt educated on falls prevention strategies, safe use of AE/DME for ADL mgt, & energy conservation strategies. Other Exercises: OT provides supervision assist while pt engages in toileting this date.   Shoulder Instructions      Home Living Family/patient expects to be discharged to:: Private residence Living Arrangements: Children Available Help at Discharge: Family;Available 24 hours/day Type of Home: Apartment Home Access: Elevator     Home Layout: One level     Bathroom Shower/Tub: Teacher, early years/pre: Handicapped height     Home Equipment: Bedside commode;Toilet riser;Shower seat;Walker - 4 wheels;Grab bars - tub/shower;Grab bars - toilet   Additional Comments: patient reports she is going home with daughter at discharge - Home set-up information for pt apartment where she lives alone.      Prior Functioning/Environment Level of Independence: Independent                 OT Problem List: Decreased strength;Decreased coordination;Cardiopulmonary status limiting activity;Decreased activity tolerance;Decreased safety awareness;Impaired balance (sitting and/or standing);Decreased knowledge of use of DME or AE      OT Treatment/Interventions: Self-care/ADL training;Therapeutic exercise;Therapeutic activities;DME and/or AE instruction;Patient/family education;Balance training    OT Goals(Current goals can be found in the care plan section) Acute  Rehab OT Goals Patient Stated Goal: to return home with daughter OT Goal Formulation: With patient Time For Goal Achievement: 07/11/19 Potential to Achieve Goals: Good ADL Goals Pt Will Perform Grooming: with modified independence;sitting Pt Will Perform Upper Body Dressing: sitting;with modified independence Pt Will Perform Lower Body Dressing: sit to/from stand;with modified independence;with adaptive equipment(With LRAD PRN for improved safety and functional independence.) Additional ADL Goal #1: Pt will independently verbalize a plan to implement at least 3 learned energy conservation strategies into her daily routines/home environment for improved safety and functional independence upon hospital DC.  OT Frequency: Min 1X/week   Barriers to D/C:            Co-evaluation              AM-PAC OT "6 Clicks" Daily Activity     Outcome Measure Help from another person eating meals?: None Help from another person taking care of personal grooming?: None Help from another person toileting, which includes using toliet, bedpan, or urinal?: None Help from another person bathing (including washing, rinsing, drying)?: A Little Help from another person to put on and taking off regular upper body clothing?: A Little Help from another person to put on and taking off regular lower body clothing?: A Little 6 Click  Score: 21   End of Session Equipment Utilized During Treatment: Gait belt  Activity Tolerance: Patient tolerated treatment well Patient left: in chair;with call bell/phone within reach;Other (comment)(Pt did not have bed alarm set upon arrival and did not have chair alarm pad in room. RN notified and OK with pt up in recliner w/o alarm pad.)  OT Visit Diagnosis: Other abnormalities of gait and mobility (R26.89)                Time: [id number] OT Time Calculation (min): 30 min Charges:  OT General Charges $OT Visit: 1 Visit OT Evaluation $OT Eval Moderate Complexity: 1 Mod OT  Treatments $Self Care/Home Management : 23-37 mins  Shara Blazing, M.S., OTR/L Ascom: [phone] 06/27/19, 11:10 AM

## 2019-06-27 NOTE — Progress Notes (Signed)
PROGRESS NOTE    Andrea [name]  [id number] DOB: 1945/12/20 DOA: 06/22/2019 PCP: Chester Holstein, MD       Assessment & Plan:   Principal Problem:   Acute on chronic respiratory failure with hypoxia [facility]) Active Problems:   CKD (chronic kidney disease) stage 3, GFR 30-59 ml/min   History of 2019 novel coronavirus disease (COVID-19)   Multifocal pneumonia   Acute CHF (congestive heart failure) (HCC)   COPD with chronic bronchitis (HCC)   Non-insulin dependent type 2 diabetes mellitus (HCC)   Chronic anticoagulation   Acute on chronic respiratory failure with hypoxia: improving each day. Possibly multifactorial and related to worsening Covid related multifocal pneumonia, COPD exacerbation & CHF exacerbation. Repeat COVID19 positive & has been positive for over 21 days. Continue on supplemental oxygen and currently on 2.5L [place] which better than baseline  Persistent multifocal pneumonia: bacterial (possible gram positive or neg) and/or viral (COVID19 positive, has been positive for over 21 days.). Completed abx course. Continue on bronchodilators. Will continue on IV steroids. Pt previously received the complete course of remdesivir so will not order. Encourage incentive spirometry.   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 Continue on IV Lasix, metoprolol. Monitor I/Os.  Not currently on ACE inhibitor likely because of chronic kidney disease.  Leukocytosis: likely secondary to steroid use. Will continue to monitor   Elevated troponin: in patient with history of CAD s/p CABG. Likely secondary to demand ischemia. 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 Avoid nephrotoxic meds. Will continue to monitor   Hyperkalemia: resolved  COPD exacerbation: continue on abxs & bronchodilators. Will continue IV steroids. Continue on supplemental oxygen and back to baseline   DM2: poorly controlled.  Will continue to hold home oral hypoglycemics. 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 No need for a transfusion at this time. Will continue to monitor   Chronic anticoagulation: continue on eliquis  Generalized weakness: PT recs home health PT. Home health orders placed  DVT prophylaxis: eliquis Code Status: full  Family Communication: Disposition Plan:. Likely in another 24-48 hours when the pt's hospital bed is delivered to pt's daughter house. Pt will be moving in with her daughter   Consultants:   Cardio    Procedures: n/a   Antimicrobials: completed abx course   Subjective: Pt c/o malaise  Objective: Vitals:   06/26/19 0840 06/26/19 1527 06/26/19 2000 06/27/19 0629  BP:  (!) 135/59 (!) 135/53 (!) 120/50  Pulse:  71 76 64  Resp:   20 16  Temp:  97.8 F (36.6 C) 97.6 F (36.4 C) 97.8 F (36.6 C)  TempSrc:  Oral Oral Oral  SpO2: 94% 100% 100% 98%  Weight:      Height:        Intake/Output Summary (Last 24 hours) at 06/27/2019 0821 Last data filed at 06/27/2019 0411 Gross per 24 hour  Intake 120 ml  Output 700 ml  Net -580 ml   Filed Weights   06/22/19 2305 06/23/19 2054 06/24/19 0316  Weight: 68.5 kg 68.8 kg 68.8 kg    Examination:  General exam: Appears calm but uncomfortable  Respiratory system: diminished breath sounds b/l. No wheezes Cardiovascular system: S1 & S2 normal. No rubs, gallops or clicks.  Gastrointestinal system: Abdomen is nondistended, soft and nontender. Normal bowel sounds heard. Central nervous system: Alert and  oriented. Moves all 4 extremities Psychiatry: Judgement and insight appear normal. Normal mood and affect    Data Reviewed: I have personally reviewed following labs and imaging studies  CBC: Recent Labs  Lab 06/23/19 1223 06/24/19 0558 06/25/19 0724 06/26/19 0631 06/27/19 0427  WBC 6.6 9.7 10.3 10.4 11.1*  HGB 7.9* 7.5* 8.0* 7.9* 8.1*  HCT 25.0*  23.4* 26.5* 26.9* 25.4*  MCV 89.3 88.6 95.0 95.1 90.7  PLT 238 276 306 328 [id number]   Basic Metabolic Panel: Recent Labs  Lab 06/23/19 1223 06/24/19 0558 06/25/19 0349 06/25/19 0944 06/26/19 0631 06/27/19 0427  NA 135 141 144  --  145 144  K 3.8 3.8 5.2* 4.9 3.8 4.1  CL 94* 100 104  --  102 101  CO2 27 29 31   --  32 31  GLUCOSE 522* 168* 137*  --  174* 149*  BUN 42* 50* 60*  --  67* 70*  CREATININE 1.97* 1.76* 1.66*  --  1.58* 1.49*  CALCIUM 8.5* 8.6* 8.8*  --  8.7* 8.7*   GFR: Estimated Creatinine Clearance: 31 mL/min (A) (by C-G formula based on SCr of 1.49 mg/dL (H)). Liver Function Tests: Recent Labs  Lab 06/22/19 2324  AST 24  ALT 19  ALKPHOS 77  BILITOT 0.7  PROT 7.2  ALBUMIN 2.5*   No results for input(s): LIPASE, AMYLASE in the last 168 hours. No results for input(s): AMMONIA in the last 168 hours. Coagulation Profile: No results for input(s): INR, PROTIME in the last 168 hours. Cardiac Enzymes: No results for input(s): CKTOTAL, CKMB, CKMBINDEX, TROPONINI in the last 168 hours. BNP (last 3 results) No results for input(s): PROBNP in the last 8760 hours. HbA1C: No results for input(s): HGBA1C in the last 72 hours. CBG: Recent Labs  Lab 06/26/19 0808 06/26/19 1212 06/26/19 1712 06/26/19 2218 06/27/19 0737  GLUCAP 142* 147* 160* 220* 199*   Lipid Profile: No results for input(s): CHOL, HDL, LDLCALC, TRIG, CHOLHDL, LDLDIRECT in the last 72 hours. Thyroid Function Tests: No results for input(s): TSH, T4TOTAL, FREET4, T3FREE, THYROIDAB in the last 72 hours. Anemia Panel: No results for input(s): VITAMINB12, FOLATE, FERRITIN, TIBC, IRON, RETICCTPCT in the last 72 hours. Sepsis Labs: Recent Labs  Lab 06/22/19 2313 06/22/19 2324  PROCALCITON  --  0.23  LATICACIDVEN 1.3  --     Recent Results (from the past 240 hour(s))  Blood culture (routine x 2)     Status: None   Collection Time: 06/22/19 11:12 PM   Specimen: BLOOD  Result Value Ref Range Status     Specimen Description BLOOD PERIPHERAL IV  Final   Special Requests   Final    BOTTLES DRAWN AEROBIC AND ANAEROBIC Blood Culture adequate volume   Culture   Final    NO GROWTH 5 DAYS Performed at [facility], [address], [place], [place] [address]    Report Status 06/27/2019 FINAL  Final  Blood culture (routine x 2)     Status: None   Collection Time: 06/22/19 11:24 PM   Specimen: BLOOD  Result Value Ref Range Status   Specimen Description BLOOD LEFT ARM  Final   Special Requests   Final    BOTTLES DRAWN AEROBIC AND ANAEROBIC Blood Culture adequate volume   Culture   Final    NO GROWTH 5 DAYS Performed at [facility], [address] [place],  [address]    Report Status 06/27/2019 FINAL  Final  Respiratory Panel by PCR  Status: None   Collection Time: 06/23/19  7:46 AM   Specimen: Nasopharyngeal Swab; Respiratory  Result Value Ref Range Status   Adenovirus NOT DETECTED NOT DETECTED Final   Coronavirus 229E NOT DETECTED NOT DETECTED Final    Comment: (NOTE) The Coronavirus on the Respiratory Panel, DOES NOT test for the novel  Coronavirus (2019 nCoV)    Coronavirus HKU1 NOT DETECTED NOT DETECTED Final   Coronavirus NL63 NOT DETECTED NOT DETECTED Final   Coronavirus OC43 NOT DETECTED NOT DETECTED Final   Metapneumovirus NOT DETECTED NOT DETECTED Final   Rhinovirus / Enterovirus NOT DETECTED NOT DETECTED Final   Influenza A NOT DETECTED NOT DETECTED Final   Influenza B NOT DETECTED NOT DETECTED Final   Parainfluenza Virus 1 NOT DETECTED NOT DETECTED Final   Parainfluenza Virus 2 NOT DETECTED NOT DETECTED Final   Parainfluenza Virus 3 NOT DETECTED NOT DETECTED Final   Parainfluenza Virus 4 NOT DETECTED NOT DETECTED Final   Respiratory Syncytial Virus NOT DETECTED NOT DETECTED Final   Bordetella pertussis NOT DETECTED NOT DETECTED Final   Chlamydophila pneumoniae NOT DETECTED NOT DETECTED Final   Mycoplasma pneumoniae NOT DETECTED NOT  DETECTED Final    Comment: Performed at [facility], [place]. [address], [place], [place] [address]  SARS CORONAVIRUS 2 (TAT 6-24 HRS) Nasopharyngeal Nasopharyngeal Swab     Status: Abnormal   Collection Time: 06/23/19  2:39 PM   Specimen: Nasopharyngeal Swab  Result Value Ref Range Status   SARS Coronavirus 2 POSITIVE (A) NEGATIVE Final    Comment: RESULT CALLED TO, READ BACK BY AND VERIFIED WITH: E.LOUISSAINT RN 2221 06/23/2019 MCCORMICK K (NOTE) SARS-CoV-2 target nucleic acids are DETECTED. The SARS-CoV-2 RNA is generally detectable in upper and lower respiratory specimens during the acute phase of infection. Positive results are indicative of the presence of SARS-CoV-2 RNA. Clinical correlation with patient history and other diagnostic information is  necessary to determine patient infection status. Positive results do not rule out bacterial infection or co-infection with other viruses.  The expected result is Negative. Fact Sheet for Patients: SugarRoll.be Fact Sheet for Healthcare Providers: https://www.woods-mathews.com/ This test is not yet approved or cleared by the [place] FDA and  has been authorized for detection and/or diagnosis of SARS-CoV-2 by FDA under an Emergency Use Authorization (EUA). This EUA will remain  in effect (meaning this test can be used)  for the duration of the COVID-19 declaration under Section 564(b)(1) of the Act, 21 U.S.C. section 360bbb-3(b)(1), unless the authorization is terminated or revoked sooner. Performed at [facility], [place] [address], [place], [place] [address]          Radiology Studies: No results found.      Scheduled Meds: . apixaban  5 mg Oral BID  . atorvastatin  40 mg Oral q1800  . budesonide  1 puff Inhalation BID  . furosemide  40 mg Intravenous Daily  . insulin aspart  0-20 Units Subcutaneous TID AC & HS  . insulin aspart  8 Units Subcutaneous TID WC  .  insulin glargine  10 Units Subcutaneous Daily  . methylPREDNISolone (SOLU-MEDROL) injection  60 mg Intravenous Q12H  . metoprolol succinate  25 mg Oral Daily  . pantoprazole  20 mg Oral Daily  . sodium chloride flush  3 mL Intravenous Q12H  . tiotropium  18 mcg Inhalation Daily   Continuous Infusions: . sodium chloride Stopped (06/25/19 0134)     LOS: 4 days    Time spent: 19  mins    Wyvonnia Dusky, MD [facility] Pager [phone]  If 7PM-7AM, please contact night-coverage [email or web address] Password [email or web address] 06/27/2019, 8:21 AM

## 2019-06-28 ENCOUNTER — Encounter: Payer: Self-pay | Admitting: Internal Medicine

## 2019-06-28 LAB — BASIC METABOLIC PANEL
Anion gap: 12 (ref 5–15)
BUN: 71 mg/dL — ABNORMAL HIGH (ref 8–23)
CO2: 30 mmol/L (ref 22–32)
Calcium: 8.8 mg/dL — ABNORMAL LOW (ref 8.9–10.3)
Chloride: 98 mmol/L (ref 98–111)
Creatinine, Ser: 1.43 mg/dL — ABNORMAL HIGH (ref 0.44–1.00)
GFR calc Af Amer: 42 mL/min — ABNORMAL LOW (ref >60)
GFR calc non Af Amer: 36 mL/min — ABNORMAL LOW (ref >60)
Glucose, Bld: 251 mg/dL — ABNORMAL HIGH (ref 70–99)
Potassium: 5 mmol/L (ref 3.5–5.1)
Sodium: 140 mmol/L (ref 135–145)

## 2019-06-28 LAB — CBC
HCT: 26.5 % — ABNORMAL LOW (ref 36.0–46.0)
Hemoglobin: 8.4 g/dL — ABNORMAL LOW (ref 12.0–15.0)
MCH: 28.8 pg (ref 26.0–34.0)
MCHC: 31.7 g/dL (ref 30.0–36.0)
MCV: 90.8 fL (ref 80.0–100.0)
Platelets: 324 10*3/uL (ref 150–400)
RBC: 2.92 MIL/uL — ABNORMAL LOW (ref 3.87–5.11)
RDW: 15.5 % (ref 11.5–15.5)
WBC: 10.7 10*3/uL — ABNORMAL HIGH (ref 4.0–10.5)
nRBC: 0.2 % (ref 0.0–0.2)

## 2019-06-28 LAB — GLUCOSE, CAPILLARY
Glucose-Capillary: 253 mg/dL — ABNORMAL HIGH (ref 70–99)
Glucose-Capillary: 305 mg/dL — ABNORMAL HIGH (ref 70–99)
Glucose-Capillary: 184 mg/dL — ABNORMAL HIGH (ref 70–99)
Glucose-Capillary: 208 mg/dL — ABNORMAL HIGH (ref 70–99)

## 2019-06-28 MED ORDER — METHYLPREDNISOLONE SODIUM SUCC 40 MG IJ SOLR
40.0000 mg | Freq: Two times a day (BID) | INTRAMUSCULAR | Status: DC
  Administered 2019-06-28 – 2019-06-29 (×2): 40 mg via INTRAVENOUS
  Filled 2019-06-28 (×2): qty 1

## 2019-06-28 NOTE — TOC Transition Note (Addendum)
Transition of Care [facility]) - CM/SW Discharge Note   Patient Details  Name: Andrea [name] MRN: [id number] Date of Birth: [birth year]  Transition of Care [facility]) CM/SW Contact:  Ross Ludwig, LCSW Phone Number: 06/28/2019, 6:21 PM   Clinical Narrative:     Patient will be discharging to her daughter's house will need EMS transport home.  Patient's daughter is waiting for hospital bed to be delivered, per patient's daughter, she will contact bedside nurse once bed has been delivered to patient's daughter's home.  Final next level of care: [place] Barriers to Discharge: Equipment Delay   Patient Goals and CMS Choice Patient states their goals for this hospitalization and ongoing recovery are:: To return back home to her daughter's house. CMS Medicare.gov Compare Post Acute Care list provided to:: Patient Choice offered to / list presented to : Adult Children  Discharge Placement  Patient discharging back home with [place], CSW updated Tommi Rumps at home health agency.              Patient to be transferred to facility by: [facility] Name of family member notified: Daughter [name] [phone] Patient and family notified of of transfer: 06/28/19  Discharge Plan and Services   Discharge Planning Services: CM Consult Post Acute Care Choice: Resumption of Svcs/PTA Provider          DME Arranged: Hospital bed DME Agency: [employer] Date DME Agency Contacted: 06/28/19 Time DME Agency Contacted: 69 Representative spoke with at DME Agency: [place]: PT, RN [place] Agency: [place] Date [place]: 06/28/19 Time Tensed: 1816 Representative spoke with at [place]: [place] ([place]) Interventions     Readmission Risk Interventions Readmission Risk Prevention Plan 06/26/2019 06/02/2019  Transportation Screening Complete Complete  PCP or Specialist Appt within 3-5 Days - Not  Complete  Not Complete comments - pt not ready to [place] or [place] - Not Complete  HRI or Home Care Consult comments - pending need  Social Work Consult for [place] Planning/Counseling - Not Complete  SW consult not completed comments - pending need  Palliative Care Screening - Not Complete  Palliative Care Screening Not Complete Comments - pending need  Medication Review (RN Care Manager) Complete Referral to Pharmacy  [place] or Home Care Consult Complete -  [place] Patient Refused -  Some recent data might be hidden

## 2019-06-28 NOTE — Progress Notes (Signed)
PROGRESS NOTE    Andrea [name]  [id number] DOB: 18-Oct-1945 DOA: 06/22/2019 PCP: Chester Holstein, MD       Assessment & Plan:   Principal Problem:   Acute on chronic respiratory failure with hypoxia [facility]) Active Problems:   CKD (chronic kidney disease) stage 3, GFR 30-59 ml/min   History of 2019 novel coronavirus disease (COVID-19)   Multifocal pneumonia   Acute CHF (congestive heart failure) (HCC)   COPD with chronic bronchitis (HCC)   Non-insulin dependent type 2 diabetes mellitus (HCC)   Chronic anticoagulation   Acute on chronic respiratory failure with hypoxia: improving each day. Possibly multifactorial and related to worsening Covid related multifocal pneumonia, COPD exacerbation & CHF exacerbation. Repeat COVID19 positive & has been positive for over 21 days. Continue on supplemental oxygen and currently on 2.5L [place] which better than baseline  Persistent multifocal pneumonia: bacterial (possible gram positive or neg) and/or viral (COVID19 positive, has been positive for over 21 days.). Completed abx course. Continue on bronchodilators. Will continue on IV steroids, . Pt previously received the complete course of remdesivir so will not order. Encourage incentive spirometry.   COPD exacerbation: continue on bronchodilators. Will continue IV steroids, decreased to solumedrol 40mg  Q12H. 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 Continue on IV Lasix, metoprolol. Monitor I/Os.  Not currently on ACE inhibitor likely because of chronic kidney disease.  Leukocytosis: likely secondary to steroid use. Will continue to monitor   Elevated troponin: in patient with history of CAD s/p CABG. Likely secondary to demand ischemia. Cardio following and recs apprec  CKDIIb: Cr is trending down again today. Avoid nephrotoxic meds. Will continue to monitor   Hyperkalemia: resolved  DM2: poorly  controlled. Will continue to hold home oral hypoglycemics. 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 Will continue to monitor   Chronic anticoagulation: continue on eliquis  Generalized weakness: PT recs home health PT.   DVT prophylaxis: eliquis Code Status: full  Family Communication: Disposition Plan:. Likely to d/c tomorrow if hospital bed is delivered to pt's daughter's house this evening. Pt will be moving in with her daughter   Consultants:   Cardio    Procedures: n/a   Antimicrobials: completed abx course   Subjective: Pt c/o fatigue   Objective: Vitals:   06/27/19 1228 06/27/19 2057 06/28/19 0443 06/28/19 0446  BP: (!) 134/47 126/63  120/68  Pulse: 70 62  65  Resp: 17 20  20   Temp: 97.9 F (36.6 C) 98.2 F (36.8 C)  97.9 F (36.6 C)  TempSrc: Oral Oral  Oral  SpO2: 100% 100%  97%  Weight:   65.9 kg   Height:        Intake/Output Summary (Last 24 hours) at 06/28/2019 0824 Last data filed at 06/27/2019 1901 Gross per 24 hour  Intake 240 ml  Output 200 ml  Net 40 ml   Filed Weights   06/23/19 2054 06/24/19 0316 06/28/19 0443  Weight: 68.8 kg 68.8 kg 65.9 kg    Examination:  General exam: Appears calm but uncomfortable  Respiratory system: decreased breath sounds b/l. No wheezes Cardiovascular system: S1 & S2 normal. No rubs, gallops or clicks.  Gastrointestinal system: Abdomen is nondistended, soft and nontender. Normal bowel sounds heard. Central nervous system: Alert and oriented. Moves all 4 extremities Psychiatry: Judgement and insight appear  normal. Normal mood and affect    Data Reviewed: I have personally reviewed following labs and imaging studies  CBC: Recent Labs  Lab 06/24/19 0558 06/25/19 0724 06/26/19 0631 06/27/19 0427 06/28/19 0600  WBC 9.7 10.3 10.4 11.1* 10.7*  HGB 7.5* 8.0* 7.9* 8.1* 8.4*  HCT 23.4* 26.5* 26.9* 25.4* 26.5*  MCV 88.6 95.0 95.1  90.7 90.8  PLT 276 306 328 328 0000000   Basic Metabolic Panel: Recent Labs  Lab 06/24/19 0558 06/25/19 0349 06/25/19 0944 06/26/19 0631 06/27/19 0427 06/28/19 0600  NA 141 144  --  145 144 140  K 3.8 5.2* 4.9 3.8 4.1 5.0  CL 100 104  --  102 101 98  CO2 29 31  --  32 31 30  GLUCOSE 168* 137*  --  174* 149* 251*  BUN 50* 60*  --  67* 70* 71*  CREATININE 1.76* 1.66*  --  1.58* 1.49* 1.43*  CALCIUM 8.6* 8.8*  --  8.7* 8.7* 8.8*   GFR: Estimated Creatinine Clearance: 31.7 mL/min (A) (by C-G formula based on SCr of 1.43 mg/dL (H)). Liver Function Tests: Recent Labs  Lab 06/22/19 2324  AST 24  ALT 19  ALKPHOS 77  BILITOT 0.7  PROT 7.2  ALBUMIN 2.5*   No results for input(s): LIPASE, AMYLASE in the last 168 hours. No results for input(s): AMMONIA in the last 168 hours. Coagulation Profile: No results for input(s): INR, PROTIME in the last 168 hours. Cardiac Enzymes: No results for input(s): CKTOTAL, CKMB, CKMBINDEX, TROPONINI in the last 168 hours. BNP (last 3 results) No results for input(s): PROBNP in the last 8760 hours. HbA1C: No results for input(s): HGBA1C in the last 72 hours. CBG: Recent Labs  Lab 06/27/19 0737 06/27/19 1220 06/27/19 1607 06/27/19 2133 06/28/19 0814  GLUCAP 199* 321* 192* 154* 253*   Lipid Profile: No results for input(s): CHOL, HDL, LDLCALC, TRIG, CHOLHDL, LDLDIRECT in the last 72 hours. Thyroid Function Tests: No results for input(s): TSH, T4TOTAL, FREET4, T3FREE, THYROIDAB in the last 72 hours. Anemia Panel: No results for input(s): VITAMINB12, FOLATE, FERRITIN, TIBC, IRON, RETICCTPCT in the last 72 hours. Sepsis Labs: Recent Labs  Lab 06/22/19 2313 06/22/19 2324  PROCALCITON  --  0.23  LATICACIDVEN 1.3  --     Recent Results (from the past 240 hour(s))  Blood culture (routine x 2)     Status: None   Collection Time: 06/22/19 11:12 PM   Specimen: BLOOD  Result Value Ref Range Status   Specimen Description BLOOD PERIPHERAL IV   Final   Special Requests   Final    BOTTLES DRAWN AEROBIC AND ANAEROBIC Blood Culture adequate volume   Culture   Final    NO GROWTH 5 DAYS Performed at [facility], [address], [place], [place] [address]    Report Status 06/27/2019 FINAL  Final  Blood culture (routine x 2)     Status: None   Collection Time: 06/22/19 11:24 PM   Specimen: BLOOD  Result Value Ref Range Status   Specimen Description BLOOD LEFT ARM  Final   Special Requests   Final    BOTTLES DRAWN AEROBIC AND ANAEROBIC Blood Culture adequate volume   Culture   Final    NO GROWTH 5 DAYS Performed at [facility], [address], [place], [place] [address]    Report Status 06/27/2019 FINAL  Final  Respiratory Panel by PCR     Status: None   Collection Time: 06/23/19  7:46  AM   Specimen: Nasopharyngeal Swab; Respiratory  Result Value Ref Range Status   Adenovirus NOT DETECTED NOT DETECTED Final   Coronavirus 229E NOT DETECTED NOT DETECTED Final    Comment: (NOTE) The Coronavirus on the Respiratory Panel, DOES NOT test for the novel  Coronavirus (2019 nCoV)    Coronavirus HKU1 NOT DETECTED NOT DETECTED Final   Coronavirus NL63 NOT DETECTED NOT DETECTED Final   Coronavirus OC43 NOT DETECTED NOT DETECTED Final   Metapneumovirus NOT DETECTED NOT DETECTED Final   Rhinovirus / Enterovirus NOT DETECTED NOT DETECTED Final   Influenza A NOT DETECTED NOT DETECTED Final   Influenza B NOT DETECTED NOT DETECTED Final   Parainfluenza Virus 1 NOT DETECTED NOT DETECTED Final   Parainfluenza Virus 2 NOT DETECTED NOT DETECTED Final   Parainfluenza Virus 3 NOT DETECTED NOT DETECTED Final   Parainfluenza Virus 4 NOT DETECTED NOT DETECTED Final   Respiratory Syncytial Virus NOT DETECTED NOT DETECTED Final   Bordetella pertussis NOT DETECTED NOT DETECTED Final   Chlamydophila pneumoniae NOT DETECTED NOT DETECTED Final   Mycoplasma pneumoniae NOT DETECTED NOT DETECTED Final    Comment: Performed at [facility], [place] [address], [place], [place] [address]  SARS CORONAVIRUS 2 (TAT 6-24 HRS) Nasopharyngeal Nasopharyngeal Swab     Status: Abnormal   Collection Time: 06/23/19  2:39 PM   Specimen: Nasopharyngeal Swab  Result Value Ref Range Status   SARS Coronavirus 2 POSITIVE (A) NEGATIVE Final    Comment: RESULT CALLED TO, READ BACK BY AND VERIFIED WITH: E.LOUISSAINT RN 2221 06/23/2019 MCCORMICK K (NOTE) SARS-CoV-2 target nucleic acids are DETECTED. The SARS-CoV-2 RNA is generally detectable in upper and lower respiratory specimens during the acute phase of infection. Positive results are indicative of the presence of SARS-CoV-2 RNA. Clinical correlation with patient history and other diagnostic information is  necessary to determine patient infection status. Positive results do not rule out bacterial infection or co-infection with other viruses.  The expected result is Negative. Fact Sheet for Patients: SugarRoll.be Fact Sheet for Healthcare Providers: https://www.woods-mathews.com/ This test is not yet approved or cleared by the [place] FDA and  has been authorized for detection and/or diagnosis of SARS-CoV-2 by FDA under an Emergency Use Authorization (EUA). This EUA will remain  in effect (meaning this test can be used)  for the duration of the COVID-19 declaration under Section 564(b)(1) of the Act, 21 U.S.C. section 360bbb-3(b)(1), unless the authorization is terminated or revoked sooner. Performed at [facility], [place] [address], [place], [place] [address]          Radiology Studies: No results found.      Scheduled Meds: . apixaban  5 mg Oral BID  . atorvastatin  40 mg Oral q1800  . budesonide  1 puff Inhalation BID  . furosemide  40 mg Intravenous Daily  . insulin aspart  0-20 Units Subcutaneous TID AC & HS  . insulin aspart  8 Units Subcutaneous TID WC  . insulin glargine  10 Units Subcutaneous Daily    . methylPREDNISolone (SOLU-MEDROL) injection  60 mg Intravenous Q12H  . metoprolol succinate  25 mg Oral Daily  . pantoprazole  20 mg Oral Daily  . sodium chloride flush  3 mL Intravenous Q12H  . tiotropium  18 mcg Inhalation Daily   Continuous Infusions: . sodium chloride Stopped (06/25/19 0134)     LOS: 5 days    Time spent: 30 mins    Wyvonnia Dusky, MD  [facility] Pager [phone]  If 7PM-7AM, please contact night-coverage [email or web address] Password [facility] 06/28/2019, 8:24 AM

## 2019-06-29 LAB — BASIC METABOLIC PANEL
Anion gap: 11 (ref 5–15)
BUN: 70 mg/dL — ABNORMAL HIGH (ref 8–23)
CO2: 33 mmol/L — ABNORMAL HIGH (ref 22–32)
Calcium: 8.7 mg/dL — ABNORMAL LOW (ref 8.9–10.3)
Chloride: 97 mmol/L — ABNORMAL LOW (ref 98–111)
Creatinine, Ser: 1.5 mg/dL — ABNORMAL HIGH (ref 0.44–1.00)
GFR calc Af Amer: 40 mL/min — ABNORMAL LOW (ref >60)
GFR calc non Af Amer: 34 mL/min — ABNORMAL LOW (ref >60)
Glucose, Bld: 187 mg/dL — ABNORMAL HIGH (ref 70–99)
Potassium: 5.2 mmol/L — ABNORMAL HIGH (ref 3.5–5.1)
Sodium: 141 mmol/L (ref 135–145)

## 2019-06-29 LAB — CBC
HCT: 28.3 % — ABNORMAL LOW (ref 36.0–46.0)
Hemoglobin: 8.4 g/dL — ABNORMAL LOW (ref 12.0–15.0)
MCH: 28.1 pg (ref 26.0–34.0)
MCHC: 29.7 g/dL — ABNORMAL LOW (ref 30.0–36.0)
MCV: 94.6 fL (ref 80.0–100.0)
Platelets: 335 10*3/uL (ref 150–400)
RBC: 2.99 MIL/uL — ABNORMAL LOW (ref 3.87–5.11)
RDW: 15.4 % (ref 11.5–15.5)
WBC: 10.6 10*3/uL — ABNORMAL HIGH (ref 4.0–10.5)
nRBC: 0 % (ref 0.0–0.2)

## 2019-06-29 LAB — GLUCOSE, CAPILLARY
Glucose-Capillary: 202 mg/dL — ABNORMAL HIGH (ref 70–99)
Glucose-Capillary: 304 mg/dL — ABNORMAL HIGH (ref 70–99)

## 2019-06-29 LAB — POTASSIUM
Potassium: 5.2 mmol/L — ABNORMAL HIGH (ref 3.5–5.1)
Potassium: 4.4 mmol/L (ref 3.5–5.1)

## 2019-06-29 MED ORDER — METOPROLOL SUCCINATE ER 25 MG PO TB24: 25.0000 mg | ORAL_TABLET | Freq: Every day | ORAL | 0 refills | Status: AC

## 2019-06-29 MED ORDER — SODIUM BICARBONATE 8.4 % IV SOLN: 50.0000 meq | Freq: Once | INTRAVENOUS | Status: DC

## 2019-06-29 MED ORDER — PREDNISONE 20 MG PO TABS: 40.0000 mg | ORAL_TABLET | Freq: Every day | ORAL | 0 refills | Status: AC

## 2019-06-29 NOTE — Social Work (Signed)
CSW completed medical necessity form for EMS transport. CSW contacted pt's daughter, [name] [phone] to inform that EMS will be transporting. Pt's daughter gave the address: [address], [place], [place] [id number] to drop the patient off and to inform her when she is picked up from hospital.   CSW dropped off medical necessity and facesheet in the pt's chart for EMS.       Ardelle Anton, MSW, [facility] ([place]) Phone: [phone] Fax: [phone]

## 2019-06-29 NOTE — Discharge Summary (Signed)
Physician Discharge Summary  Andrea [name] [id number] DOB: [birth year] DOA: 06/22/2019  PCP: [place] Holstein, MD  Admit date: 06/22/2019 Discharge date: 06/29/2019  Admitted From: home Disposition:  Home w/ home health  Recommendations for Outpatient Follow-up:  1. Follow up with PCP in 1-2 weeks 2. F/u cardio in 1-2 weeks 3. F/u pulmon in 1-2 weeks  Home Health: yes Equipment/Devices: oxygen 3L [place]  Discharge Condition: stable CODE STATUS:full  Diet recommendation: Heart Healthy / Carb Modified  Brief/Interim Summary: HPI taken from Dr. Damita Dunnings:    Andrea [name] is a 74 y.o. female with medical history significant for COPD, CAD status post CABG, CKD 3, congestive heart failure on apixaban for unclear iindication ,hospitalized twice in the past month with acute respiratory failure related to Covid pneumonia discharged most recently on 12/15 on O2 at 4 L, who returns to the emergency room after her children noticed that she was short of breath and desaturating to the mid 80s while on oxygen at 4 L.  She denies chest pain, orthopnea or PND and denies lower extremity edema. ED Course: On arrival in the emergency room she had a temperature of 100.5.  Blood pressure 147/62 with O2 sat 96% on 10 L via nonrebreather.  He was tachypneic with respirations of 21.  Pulse was 96.  EKG showed normal sinus rhythm, first troponin was 86 and BNP was 1179.  Chest x-ray showed multifocal patchy airspace consolidation with multifocal pneumonia.  Other blood work was mostly at baseline.  Patient was administered Solu-Medrol and duo nebs in the emergency room and hospitalist consulted for admission.  Hospital Course from Dr. Lenise Herald 06/23/19-06/29/19: Pt was found to have acute on chronic hypoxic respiratory failure likely secondary to pneumonia, COPD exacerbation. COVID19 & CHF exacerbation. Pt was eventually taken off of airborne and contact precautions as it had been over 21 since the pt tested  positive. Pt was treated w/ IV abxs, IV lasix, IV steroids, bronchodilators, incentive spirometry and supplemental oxygen. Pt did not receive IV remdesivir as pt had already completed the course on a previous admission. Pt's supplemental oxygen was able to be weaned to 3L [place] which is slightly less than pt's baseline of 4L [place]. PT/OT both saw the pt and recommended home health PT/OT. This was set up by CM prior to d/c. Pt was also sent home w/ a hospital bed, bedside toilet as well as supplemental oxygen. Pt was going to move in with her daughter  Discharge Diagnoses:  Principal Problem:   Acute on chronic respiratory failure with hypoxia (HCC) Active Problems:   CKD (chronic kidney disease) stage 3, GFR 30-59 ml/min   History of 2019 novel coronavirus disease (COVID-19)   Multifocal pneumonia   Acute CHF (congestive heart failure) (HCC)   COPD with chronic bronchitis (HCC)   Non-insulin dependent type 2 diabetes mellitus (HCC)   Chronic anticoagulation   Acute on chronic respiratory failure with hypoxia: improving each day. Possibly multifactorialand related to worsening Covid related multifocal pneumonia, COPD exacerbation & CHF exacerbation. Repeat COVID19 positive & has been positive for over 21 days. Continue on supplemental oxygen and currently on 3L [place] which better than baseline  Persistent multifocal pneumonia: bacterial (possible gram positive or neg) and/or viral (COVID19 positive, has been positive for over 21 days.). Completed abx course. Continue on bronchodilators. Will continue on IV steroids, . Pt previously received the complete course of remdesivir so will not order. Encourage incentive spirometry.   COPD exacerbation: continue on bronchodilators.  Will continue IV steroids, decreased to solumedrol 40mg  Q12H. Continue on supplemental oxygen  Acute on chronic combinedCHF exacerbation: echo shows EF 40-45% & grade II diastolic dysfunction. Continue on IV Lasix, metoprolol.  Monitor I/Os. Not currently on ACE inhibitor likely because of chronic kidney disease.  Leukocytosis: likely secondary to steroid use. Will continue to monitor   Elevated troponin: in patient with history of CAD s/p CABG. Likely secondary to demand ischemia. Cardio following and recs apprec  CKDIIb: Cr is trending down again today. Avoid nephrotoxic meds. Will continue to monitor   Hyperkalemia: resolved  DM2: poorly controlled. Will continue to hold home oral hypoglycemics. 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 Will continue to monitor   Chronic anticoagulation: continue on eliquis  Generalized weakness: PT/OT recs home health PT/OT.   Discharge Instructions  Discharge Instructions    Diet - low sodium heart healthy   Complete by: As directed    Diet Carb Modified   Complete by: As directed    Discharge instructions   Complete by: As directed    F/u PCP in 1-2 weeks; F/u cardio in 1-2 weeks; F/u pulmon in 1-2 weeks   Increase activity slowly   Complete by: As directed      Allergies as of 06/29/2019      Reactions   Metformin And Related Diarrhea   Sulfur Hives      Medication List    TAKE these medications   albuterol 108 (90 Base) MCG/ACT inhaler Commonly known as: VENTOLIN HFA Inhale 2 puffs into the lungs every 6 (six) hours as needed for wheezing or shortness of breath.   amLODipine 5 MG tablet Commonly known as: NORVASC Take 5 mg by mouth daily.   apixaban 5 MG Tabs tablet Commonly known as: ELIQUIS Take 1 tablet (5 mg total) by mouth 2 (two) times daily.   aspirin EC 81 MG tablet Take 81 mg by mouth daily.   atorvastatin 40 MG tablet Commonly known as: LIPITOR Take 40 mg by mouth at bedtime.   chlorpheniramine-HYDROcodone 10-8 MG/5ML Suer Commonly known as: TUSSIONEX Take 5 mLs by mouth every 12 (twelve) hours as needed for cough.   ezetimibe 10 MG  tablet Commonly known as: ZETIA Take 10 mg by mouth daily.   fluticasone 50 MCG/ACT nasal spray Commonly known as: FLONASE Place 1 spray into both nostrils daily as needed for allergies.   furosemide 20 MG tablet Commonly known as: LASIX Take 40 mg by mouth daily.   glipiZIDE 10 MG tablet Commonly known as: GLUCOTROL Take 10 mg by mouth 2 (two) times daily before a meal.   Januvia 50 MG tablet Generic drug: sitaGLIPtin Take 50 mg by mouth daily.   metoprolol succinate 25 MG 24 hr tablet Commonly known as: TOPROL-XL Take 1 tablet (25 mg total) by mouth daily. Start taking on: June 30, 2019 What changed:   medication strength  how much to take   Multi-Vitamins Tabs Take 1 tablet by mouth daily.   nitroGLYCERIN 0.4 MG SL tablet Commonly known as: NITROSTAT Place 0.4 mg under the tongue every 5 (five) minutes as needed for chest pain.   omeprazole 20 MG capsule Commonly known as: PRILOSEC Take 20 mg by mouth daily.   predniSONE 20 MG tablet Commonly known as: Deltasone Take 2 tablets (40 mg total) by mouth daily for 5 days.   Spiriva HandiHaler 18 MCG inhalation capsule  Generic drug: tiotropium Place 18 mcg into inhaler and inhale daily.   Symbicort 160-4.5 MCG/ACT inhaler Generic drug: budesonide-formoterol Inhale 2 puffs into the lungs 2 (two) times daily.   Vitamin D3 25 MCG (1000 UT) Caps Take 1,000 Units by mouth daily.            Durable Medical Equipment  (From admission, onward)         Start     Ordered   06/26/19 1740  For home use only DME oxygen  Once    Question Answer Comment  Length of Need 6 Months   Liters per Minute 3   Frequency Continuous (stationary and portable oxygen unit needed)   Oxygen delivery system Gas      06/26/19 1740         Follow-up Information    [facility] Follow up on 07/01/2019.   Specialty: Cardiology Why: at 9:00am. Enter through the [facility]  entrance Contact information: [place] [phone]       Che[place] Holstein, MD. Schedule an appointment as soon as possible for a visit.   Specialty: Family Medicine Why: Hospital follow up appointment in 3-5 days         Allergies  Allergen Reactions  . Metformin And Related Diarrhea  . Sulfur Hives    Consultations:  cardio   Procedures/Studies: DG Chest Port 1 View  Result Date: 06/25/2019 CLINICAL DATA:  Pneumonia. EXAM: PORTABLE CHEST 1 VIEW COMPARISON:  June 22, 2019. FINDINGS: Stable cardiomediastinal silhouette. No pneumothorax is noted. Stable bilateral lung opacities are noted, left greater than right, concerning for multifocal pneumonia and probable associated small pleural effusions. Bony thorax is unremarkable. IMPRESSION: Stable bilateral lung opacities are noted, left greater than right, concerning for multifocal pneumonia and associated small pleural effusions. Electronically Signed   By: Marijo Conception M.D.   On: 06/25/2019 07:59   DG Chest Portable 1 View  Result Date: 06/22/2019 CLINICAL DATA:  Shortness of breath EXAM: PORTABLE CHEST 1 VIEW COMPARISON:  May 31, 2019 FINDINGS: The cardiomediastinal silhouette is obscured. Overlying median sternotomy wires. Large area of patchy consolidation seen throughout the left mid and lower lung. There is increased interstitial markings seen in the left upper lung. Small amount of hazy airspace opacity at the right lung base. There is a blunting of the bilateral costophrenic angles, likely small bilateral pleural effusions. No acute osseous abnormality. IMPRESSION: Multifocal patchy airspace consolidation seen predominantly throughout the left lung and right lung base. This is likely due to multifocal pneumonia. Small bilateral pleural effusions. Electronically Signed   By: Prudencio Pair M.D.   On: 06/22/2019 23:47   DG Chest Portable 1 View  Result Date:  05/31/2019 CLINICAL DATA:  Shortness of breath, cough, headache. Diagnosed with COVID on 11/20. EXAM: PORTABLE CHEST 1 VIEW COMPARISON:  05/20/2019 FINDINGS: Mild patchy opacity in the lingula and bilateral lower lobes. No pleural effusion or pneumothorax. The heart is normal in size. Postsurgical changes related to prior CABG. Left vascular stent. Median sternotomy. Surgical clips overlying the right breast/lateral chest wall. IMPRESSION: Mild patchy opacities in the lingula and bilateral lower lobes, suspicious for pneumonia. Electronically Signed   By: Julian Hy M.D.   On: 05/31/2019 11:24   ECHOCARDIOGRAM COMPLETE  Result Date: 06/23/2019   ECHOCARDIOGRAM REPORT   Patient Name:   Andrea [name] Date of Exam: 06/23/2019 Medical Rec #:  [id number]  Height:       62.0 in Accession #:    [id number]       Weight:       151.0 lb Date of Birth:  1945-12-15        BSA:          1.70 m Patient Age:    74 years         BP:           138/59 mmHg Patient Gender: F                HR:           90 bpm. Exam Location:  [facility] Procedure: 2D Echo, Cardiac Doppler and Color Doppler Indications:     Abnormal ECG 794.31  History:         Patient has no prior history of Echocardiogram examinations.                  Prior CABG, COPD; Risk Factors:Diabetes. Heart attack.  Sonographer:     Sherrie Sport RDCS (AE) Referring Phys:  [id number] Athena Masse Diagnosing Phys: Nelva Bush MD  Sonographer Comments: CABG surgical scar. IMPRESSIONS  1. Left ventricular ejection fraction, by visual estimation, is 40 to 45%. The left ventricle has moderately decreased function. There is mildly increased left ventricular hypertrophy.  2. Moderate hypokinesis of the left ventricular, entire anteroseptal wall and anterior wall.  3. Elevated left atrial pressure.  4. Left ventricular diastolic parameters are consistent with Grade II diastolic dysfunction (pseudonormalization).  5. The left ventricle demonstrates regional wall motion  abnormalities.  6. Global right ventricle has normal systolic function.The right ventricular size is normal. No increase in right ventricular wall thickness.  7. Left atrial size was normal.  8. Right atrial size was normal.  9. Mild mitral annular calcification. 10. The mitral valve is degenerative. Trivial mitral valve regurgitation. No evidence of mitral stenosis. 11. The tricuspid valve is not well visualized. 12. The aortic valve was not well visualized. Aortic valve regurgitation is not visualized. Mild aortic valve sclerosis without stenosis. 13. The pulmonic valve was not well visualized. Pulmonic valve regurgitation is not visualized. 14. Moderately elevated pulmonary artery systolic pressure. 15. The inferior vena cava is normal in size with greater than 50% respiratory variability, suggesting right atrial pressure of 3 mmHg. FINDINGS  Left Ventricle: Left ventricular ejection fraction, by visual estimation, is 40 to 45%. The left ventricle has moderately decreased function. Moderate hypokinesis of the left ventricular, entire anteroseptal wall and anterior wall. The left ventricle demonstrates regional wall motion abnormalities. The left ventricular internal cavity size was the left ventricle is normal in size. There is mildly increased left ventricular hypertrophy. Left ventricular diastolic parameters are consistent with Grade II diastolic dysfunction (pseudonormalization). Elevated left atrial pressure. Right Ventricle: The right ventricular size is normal. No increase in right ventricular wall thickness. Global RV systolic function is has normal systolic function. The tricuspid regurgitant velocity is 3.12 m/s, and with an assumed right atrial pressure  of 3 mmHg, the estimated right ventricular systolic pressure is moderately elevated at 41.9 mmHg. Left Atrium: Left atrial size was normal in size. Right Atrium: Right atrial size was normal in size Pericardium: There is no evidence of pericardial  effusion. Mitral Valve: The mitral valve is degenerative in appearance. There is mild thickening of the mitral valve leaflet(s). Mild mitral annular calcification. Trivial mitral valve regurgitation. No evidence of mitral valve stenosis by observation. Tricuspid Valve: The  tricuspid valve is not well visualized. Tricuspid valve regurgitation is mild. Aortic Valve: The aortic valve was not well visualized. Aortic valve regurgitation is not visualized. Mild aortic valve sclerosis is present, with no evidence of aortic valve stenosis. Aortic valve mean gradient measures 4.0 mmHg. Aortic valve peak gradient measures 6.1 mmHg. Aortic valve area, by VTI measures 1.51 cm. Pulmonic Valve: The pulmonic valve was not well visualized. Pulmonic valve regurgitation is not visualized. Pulmonic regurgitation is not visualized. No evidence of pulmonic stenosis. Aorta: The aortic root is normal in size and structure. Pulmonary Artery: The pulmonary artery is not well seen. Venous: The inferior vena cava is normal in size with greater than 50% respiratory variability, suggesting right atrial pressure of 3 mmHg. IAS/Shunts: No atrial level shunt detected by color flow Doppler.  LEFT VENTRICLE PLAX 2D LVIDd:         4.87 cm       Diastology LVIDs:         3.80 cm       LV e' lateral:   8.38 cm/s LV PW:         1.10 cm       LV E/e' lateral: 10.8 LV IVS:        0.66 cm       LV e' medial:    2.83 cm/s LVOT diam:     2.00 cm       LV E/e' medial:  31.9 LV SV:         49 ml LV SV Index:   28.14 LVOT Area:     3.14 cm  LV Volumes (MOD) LV area d, A2C:    23.80 cm LV area d, A4C:    22.80 cm LV area s, A2C:    18.00 cm LV area s, A4C:    16.10 cm LV major d, A2C:   6.22 cm LV major d, A4C:   6.17 cm LV major s, A2C:   5.94 cm LV major s, A4C:   5.34 cm LV vol d, MOD A2C: 75.8 ml LV vol d, MOD A4C: 69.0 ml LV vol s, MOD A2C: 47.7 ml LV vol s, MOD A4C: 41.2 ml LV SV MOD A2C:     28.1 ml LV SV MOD A4C:     69.0 ml LV SV MOD BP:      25.8  ml RIGHT VENTRICLE RV Basal diam:  2.55 cm RV S prime:     10.10 cm/s TAPSE (M-mode): 3.7 cm LEFT ATRIUM             Index       RIGHT ATRIUM           Index LA diam:        3.90 cm 2.30 cm/m  RA Area:     15.40 cm LA Vol (A2C):   40.3 ml 23.76 ml/m RA Volume:   38.40 ml  22.64 ml/m LA Vol (A4C):   36.6 ml 21.57 ml/m LA Biplane Vol: 41.6 ml 24.52 ml/m  AORTIC VALVE                   PULMONIC VALVE AV Area (Vmax):    1.41 cm    PV Vmax:        0.75 m/s AV Area (Vmean):   1.27 cm    PV Peak grad:   2.2 mmHg AV Area (VTI):     1.51 cm    RVOT Peak grad: 3 mmHg AV Vmax:  123.67 cm/s AV Vmean:          95.567 cm/s AV VTI:            0.298 m AV Peak Grad:      6.1 mmHg AV Mean Grad:      4.0 mmHg LVOT Vmax:         55.40 cm/s LVOT Vmean:        38.500 cm/s LVOT VTI:          0.143 m LVOT/AV VTI ratio: 0.48  AORTA Ao Root diam: 2.10 cm MITRAL VALVE                        TRICUSPID VALVE MV Area (PHT): 4.68 cm             TR Peak grad:   38.9 mmHg MV PHT:        46.98 msec           TR Vmax:        312.00 cm/s MV Decel Time: 162 msec MV E velocity: 90.20 cm/s 103 cm/s  SHUNTS MV A velocity: 60.10 cm/s 70.3 cm/s Systemic VTI:  0.14 m MV E/A ratio:  1.50       1.5       Systemic Diam: 2.00 cm  Nelva Bush MD Electronically signed by Nelva Bush MD Signature Date/Time: 06/23/2019/9:56:58 AM    Final      Subjective: Pt c/o malaise   Discharge Exam: Vitals:   06/29/19 0904 06/29/19 1144  BP: (!) 145/52 137/67  Pulse: 62 67  Resp:  18  Temp:  98.1 F (36.7 C)  SpO2:  98%   Vitals:   06/29/19 0607 06/29/19 0608 06/29/19 0904 06/29/19 1144  BP:  (!) 140/46 (!) 145/52 137/67  Pulse:  (!) 59 62 67  Resp:  17  18  Temp:  97.7 F (36.5 C)  98.1 F (36.7 C)  TempSrc:  Oral  Oral  SpO2:  98%  98%  Weight: 66.4 kg     Height:        General: Pt is alert, awake, not in acute distress Cardiovascular: ,S1/S2 +, no rubs, no gallops Respiratory: diminished breath sounds b/l. No  rales Abdominal: Soft, NT, ND, bowel sounds + Extremities: b/l LE edema, no cyanosis    The results of significant diagnostics from this hospitalization (including imaging, microbiology, ancillary and laboratory) are listed below for reference.     Microbiology: Recent Results (from the past 240 hour(s))  Blood culture (routine x 2)     Status: None   Collection Time: 06/22/19 11:12 PM   Specimen: BLOOD  Result Value Ref Range Status   Specimen Description BLOOD PERIPHERAL IV  Final   Special Requests   Final    BOTTLES DRAWN AEROBIC AND ANAEROBIC Blood Culture adequate volume   Culture   Final    NO GROWTH 5 DAYS Performed at [facility], [address], [place], [place] [address]    Report Status 06/27/2019 FINAL  Final  Blood culture (routine x 2)     Status: None   Collection Time: 06/22/19 11:24 PM   Specimen: BLOOD  Result Value Ref Range Status   Specimen Description BLOOD LEFT ARM  Final   Special Requests   Final    BOTTLES DRAWN AEROBIC AND ANAEROBIC Blood Culture adequate volume   Culture   Final    NO GROWTH 5 DAYS Performed at [facility], [place], [place] [address]    Report Status 06/27/2019 FINAL  Final  Respiratory Panel by PCR     Status: None   Collection Time: 06/23/19  7:46 AM   Specimen: Nasopharyngeal Swab; Respiratory  Result Value Ref Range Status   Adenovirus NOT DETECTED NOT DETECTED Final   Coronavirus 229E NOT DETECTED NOT DETECTED Final    Comment: (NOTE) The Coronavirus on the Respiratory Panel, DOES NOT test for the novel  Coronavirus (2019 nCoV)    Coronavirus HKU1 NOT DETECTED NOT DETECTED Final   Coronavirus NL63 NOT DETECTED NOT DETECTED Final   Coronavirus OC43 NOT DETECTED NOT DETECTED Final   Metapneumovirus NOT DETECTED NOT DETECTED Final   Rhinovirus / Enterovirus NOT DETECTED NOT DETECTED Final   Influenza A NOT DETECTED NOT DETECTED Final   Influenza B NOT DETECTED NOT DETECTED Final    Parainfluenza Virus 1 NOT DETECTED NOT DETECTED Final   Parainfluenza Virus 2 NOT DETECTED NOT DETECTED Final   Parainfluenza Virus 3 NOT DETECTED NOT DETECTED Final   Parainfluenza Virus 4 NOT DETECTED NOT DETECTED Final   Respiratory Syncytial Virus NOT DETECTED NOT DETECTED Final   Bordetella pertussis NOT DETECTED NOT DETECTED Final   Chlamydophila pneumoniae NOT DETECTED NOT DETECTED Final   Mycoplasma pneumoniae NOT DETECTED NOT DETECTED Final    Comment: Performed at [facility], [place], [place], [place] [address]  SARS CORONAVIRUS 2 (TAT 6-24 HRS) Nasopharyngeal Nasopharyngeal Swab     Status: Abnormal   Collection Time: 06/23/19  2:39 PM   Specimen: Nasopharyngeal Swab  Result Value Ref Range Status   SARS Coronavirus 2 POSITIVE (A) NEGATIVE Final    Comment: RESULT CALLED TO, READ BACK BY AND VERIFIED WITH: E.LOUISSAINT RN 2221 06/23/2019 MCCORMICK K (NOTE) SARS-CoV-2 target nucleic acids are DETECTED. The SARS-CoV-2 RNA is generally detectable in upper and lower respiratory specimens during the acute phase of infection. Positive results are indicative of the presence of SARS-CoV-2 RNA. Clinical correlation with patient history and other diagnostic information is  necessary to determine patient infection status. Positive results do not rule out bacterial infection or co-infection with other viruses.  The expected result is Negative. Fact Sheet for Patients: SugarRoll.be Fact Sheet for Healthcare Providers: https://www.woods-mathews.com/ This test is not yet approved or cleared by the [place] FDA and  has been authorized for detection and/or diagnosis of SARS-CoV-2 by FDA under an Emergency Use Authorization (EUA). This EUA will remain  in effect (meaning this test can be used)  for the duration of the COVID-19 declaration under Section 564(b)(1) of the Act, 21 U.S.C. section 360bbb-3(b)(1), unless the  authorization is terminated or revoked sooner. Performed at [facility], [place] [address] [place], [place] [address]      Labs: BNP (last 3 results) Recent Labs    05/31/19 1024 05/31/19 1424 06/22/19 2313  BNP 395.7* 335.4* 123XX123*   Basic Metabolic Panel: Recent Labs  Lab 06/25/19 0349 06/26/19 0631 06/27/19 0427 06/28/19 0600 06/29/19 0550 06/29/19 0930 06/29/19 1248  NA 144 145 144 140 141  --   --   K 5.2* 3.8 4.1 5.0 5.2* 5.2* 4.4  CL 104 102 101 98 97*  --   --   CO2 31 32 31 30 33*  --   --   GLUCOSE 137* 174* 149* 251* 187*  --   --   BUN 60* 67* 70* 71* 70*  --   --   CREATININE  1.66* 1.58* 1.49* 1.43* 1.50*  --   --   CALCIUM 8.8* 8.7* 8.7* 8.8* 8.7*  --   --    Liver Function Tests: Recent Labs  Lab 06/22/19 2324  AST 24  ALT 19  ALKPHOS 77  BILITOT 0.7  PROT 7.2  ALBUMIN 2.5*   No results for input(s): LIPASE, AMYLASE in the last 168 hours. No results for input(s): AMMONIA in the last 168 hours. CBC: Recent Labs  Lab 06/25/19 0724 06/26/19 0631 06/27/19 0427 06/28/19 0600 06/29/19 0550  WBC 10.3 10.4 11.1* 10.7* 10.6*  HGB 8.0* 7.9* 8.1* 8.4* 8.4*  HCT 26.5* 26.9* 25.4* 26.5* 28.3*  MCV 95.0 95.1 90.7 90.8 94.6  PLT 306 328 328 324 335   Cardiac Enzymes: No results for input(s): CKTOTAL, CKMB, CKMBINDEX, TROPONINI in the last 168 hours. BNP: Invalid input(s): POCBNP CBG: Recent Labs  Lab 06/28/19 1206 06/28/19 1643 06/28/19 2115 06/29/19 0752 06/29/19 1145  GLUCAP 305* 184* 208* 202* 304*   D-Dimer No results for input(s): DDIMER in the last 72 hours. Hgb A1c No results for input(s): HGBA1C in the last 72 hours. Lipid Profile No results for input(s): CHOL, HDL, LDLCALC, TRIG, CHOLHDL, LDLDIRECT in the last 72 hours. Thyroid function studies No results for input(s): TSH, T4TOTAL, T3FREE, THYROIDAB in the last 72 hours.  Invalid input(s): FREET3 Anemia work up No results for input(s): VITAMINB12, FOLATE,  FERRITIN, TIBC, IRON, RETICCTPCT in the last 72 hours. Urinalysis    Component Value Date/Time   COLORURINE YELLOW 05/16/2019 0158   APPEARANCEUR CLEAR 05/16/2019 0158   LABSPEC 1.010 05/16/2019 0158   PHURINE 6.0 05/16/2019 0158   GLUCOSEU NEGATIVE 05/16/2019 0158   HGBUR NEGATIVE 05/16/2019 0158   BILIRUBINUR NEGATIVE 05/16/2019 0158   KETONESUR NEGATIVE 05/16/2019 0158   PROTEINUR 30 (A) 05/16/2019 0158   NITRITE NEGATIVE 05/16/2019 0158   LEUKOCYTESUR TRACE (A) 05/16/2019 0158   Sepsis Labs Invalid input(s): PROCALCITONIN,  WBC,  LACTICIDVEN Microbiology Recent Results (from the past 240 hour(s))  Blood culture (routine x 2)     Status: None   Collection Time: 06/22/19 11:12 PM   Specimen: BLOOD  Result Value Ref Range Status   Specimen Description BLOOD PERIPHERAL IV  Final   Special Requests   Final    BOTTLES DRAWN AEROBIC AND ANAEROBIC Blood Culture adequate volume   Culture   Final    NO GROWTH 5 DAYS Performed at [facility], [address] [place], [place] [address]    Report Status 06/27/2019 FINAL  Final  Blood culture (routine x 2)     Status: None   Collection Time: 06/22/19 11:24 PM   Specimen: BLOOD  Result Value Ref Range Status   Specimen Description BLOOD LEFT ARM  Final   Special Requests   Final    BOTTLES DRAWN AEROBIC AND ANAEROBIC Blood Culture adequate volume   Culture   Final    NO GROWTH 5 DAYS Performed at [facility], [address] [place],  [address]    Report Status 06/27/2019 FINAL  Final  Respiratory Panel by PCR     Status: None   Collection Time: 06/23/19  7:46 AM   Specimen: Nasopharyngeal Swab; Respiratory  Result Value Ref Range Status   Adenovirus NOT DETECTED NOT DETECTED Final   Coronavirus 229E NOT DETECTED NOT DETECTED Final    Comment: (NOTE) The Coronavirus on the Respiratory Panel, DOES NOT test for the novel  Coronavirus (2019 nCoV)    Coronavirus HKU1  NOT DETECTED NOT DETECTED Final    Coronavirus NL63 NOT DETECTED NOT DETECTED Final   Coronavirus OC43 NOT DETECTED NOT DETECTED Final   Metapneumovirus NOT DETECTED NOT DETECTED Final   Rhinovirus / Enterovirus NOT DETECTED NOT DETECTED Final   Influenza A NOT DETECTED NOT DETECTED Final   Influenza B NOT DETECTED NOT DETECTED Final   Parainfluenza Virus 1 NOT DETECTED NOT DETECTED Final   Parainfluenza Virus 2 NOT DETECTED NOT DETECTED Final   Parainfluenza Virus 3 NOT DETECTED NOT DETECTED Final   Parainfluenza Virus 4 NOT DETECTED NOT DETECTED Final   Respiratory Syncytial Virus NOT DETECTED NOT DETECTED Final   Bordetella pertussis NOT DETECTED NOT DETECTED Final   Chlamydophila pneumoniae NOT DETECTED NOT DETECTED Final   Mycoplasma pneumoniae NOT DETECTED NOT DETECTED Final    Comment: Performed at [facility], [place] [address] [place], [place] [address]  SARS CORONAVIRUS 2 (TAT 6-24 HRS) Nasopharyngeal Nasopharyngeal Swab     Status: Abnormal   Collection Time: 06/23/19  2:39 PM   Specimen: Nasopharyngeal Swab  Result Value Ref Range Status   SARS Coronavirus 2 POSITIVE (A) NEGATIVE Final    Comment: RESULT CALLED TO, READ BACK BY AND VERIFIED WITH: E.LOUISSAINT RN [id number] 06/23/2019 MCCORMICK K (NOTE) SARS-CoV-2 target nucleic acids are DETECTED. The SARS-CoV-2 RNA is generally detectable in upper and lower respiratory specimens during the acute phase of infection. Positive results are indicative of the presence of SARS-CoV-2 RNA. Clinical correlation with patient history and other diagnostic information is  necessary to determine patient infection status. Positive results do not rule out bacterial infection or co-infection with other viruses.  The expected result is Negative. Fact Sheet for Patients: SugarRoll.be Fact Sheet for Healthcare Providers: https://www.woods-mathews.com/ This test is not yet approved or cleared by the [place] FDA and  has been  authorized for detection and/or diagnosis of SARS-CoV-2 by FDA under an Emergency Use Authorization (EUA). This EUA will remain  in effect (meaning this test can be used)  for the duration of the COVID-19 declaration under Section 564(b)(1) of the Act, 21 U.S.C. section 360bbb-3(b)(1), unless the authorization is terminated or revoked sooner. Performed at [facility], [place] [address], [place], [place] [address]      Time coordinating discharge: Over 30 minutes  SIGNED:   Wyvonnia Dusky, MD  [facility] 06/29/2019, 3:35 PM Pager   If 7PM-7AM, please contact night-coverage [email or web address] Password [email or web address]

## 2019-06-29 NOTE — Plan of Care (Addendum)
Called and spoke with Daughter/[name] to update on current POC and patient status.  Understands that we are waiting on potassium lab results for 1300.  Once results post - Dr. Aletha Halim decide if ok to DC home with daughter.  If patinet is discharging, also agreed to call and inform [name] once EMS arrives (so she can have someone at home to receive patient) - till she gets off work.      Patient IV removed.  RN assessment and VS revealed stability for DC to daughters home.  Discharge papers printed, given, educated as needed.  Told of suggested FU with PCP and scrip sent to pharm, per patient request.  [facility] set up for home and O2 concentrator + hospital bed delivered prior to DC.  Portable concentrator had to remain at hospital (Left at [facility], label/identifier placed  and chrg aware) since EMS did not have room to transport.  Daughter/[name]  informed and agreed to pick up after work (about 8PM).  EMS arrived and transported to daughters home, who set up someone to meet them at home and stay  - till off work.

## 2019-06-30 ENCOUNTER — Telehealth: Payer: Self-pay | Admitting: Family

## 2019-06-30 NOTE — Telephone Encounter (Signed)
LVM on 1/4 to follow up with patient since recent hospital d/c and confirm her new patient heart failure clinic appt scheduled for 1/5.    Alyse Low, [place]

## 2019-06-30 NOTE — Progress Notes (Signed)
Patient ID: Andrea [name], female    DOB: 1945/10/21, 74 y.o.   MRN: [id number]  HPI  Andrea [name] is a 74 y/o female with a history of atrial fibrillation, DM, HTN, CKD, COPD and chronic heart failure.   Echo report from 06/23/2019 reviewed and showed an EF of 40-45% along with trivial MR, mild aortic stenosis and moderately elevated PA pressure.   Patient admitted 06/22/2019 due to acute on chronic heart failure and COPD exacerbation. Cardiology consult obtained. Antibiotics given for pneumonia along with IV steroids for positive COVID test. Initially given IV lasix and then transitioned to oral diuretics. Elevated troponin thought to be due to demand ischemia. PT evaluation done. Had + COVID test on 06/23/2019 but respiratory panel by PCR was negative on 06/23/2019. Discharged after 7 days. Admitted 06/16/2019 due to HF exacerbation. Initially needed IV lasix and then transitioned to oral diuretics. Oxygen weaned. Discharged after 3 days. Admitted 05/31/2019 due to acute on chronic respiratory failure with hypoxia due to possible HAP and COPD exacerbation. Placed on steroids initially but then stopped due to hyperglycemia which required insulin drip. Given antibiotics and remdesivir. Home health PT/OT ordered. Discharged after 10 days.   She presents today for her initial visit with a chief complaint of minimal shortness of breath upon moderate exertion. She describes this as chronic in nature having been present for several months. She has associated fatigue, intermittent palpitations and easy bruising along with this. She denies any difficulty sleeping, dizziness, abdominal distention, pedal edema, chest pain, cough or weight gain.   She currently has PT, OT and home health nursing coming to the home. She is in the process of getting all her providers up here in [place] so she doesn't have to drive to [facility].   Past Medical History:  Diagnosis Date  . Arrhythmia    atrial fibrillation   . CHF (congestive heart failure) ([place])   . COPD (chronic obstructive pulmonary disease) ([place])   . Diabetes mellitus without complication ([place])   . Heart attack ([place])   . Hypertension   . Renal disorder    Past Surgical History:  Procedure Laterality Date  . ABDOMINAL HYSTERECTOMY    . CARDIAC SURGERY     stents   No family history on file. Social History   Tobacco Use  . Smoking status: Never Smoker  . Smokeless tobacco: Never Used  Substance Use Topics  . Alcohol use: No   Allergies  Allergen Reactions  . Metformin And Related Diarrhea  . Sulfur Hives   Prior to Admission medications   Medication Sig Start Date End Date Taking? Authorizing Provider  albuterol (PROVENTIL HFA;VENTOLIN HFA) 108 (90 Base) MCG/ACT inhaler Inhale 2 puffs into the lungs every 6 (six) hours as needed for wheezing or shortness of breath. 11/18/17  Yes Wieting, Richard, MD  amLODipine (NORVASC) 5 MG tablet Take 5 mg by mouth daily.  09/12/18  Yes [provider]  apixaban (ELIQUIS) 5 MG TABS tablet Take 1 tablet (5 mg total) by mouth 2 (two) times daily. 06/10/19  Yes Mercy Riding, MD  aspirin EC 81 MG tablet Take 81 mg by mouth daily.   Yes [provider]  atorvastatin (LIPITOR) 40 MG tablet Take 40 mg by mouth at bedtime. 10/09/17  Yes [provider]  budesonide-formoterol (SYMBICORT) 160-4.5 MCG/ACT inhaler Inhale 2 puffs into the lungs 2 (two) times daily.  06/28/16  Yes [provider]  Cholecalciferol (VITAMIN D3) 1000 units CAPS Take 1,000  Units by mouth daily.    Yes [provider]  ezetimibe (ZETIA) 10 MG tablet Take 10 mg by mouth daily.  04/28/19 04/27/20 Yes [provider]  fluticasone (FLONASE) 50 MCG/ACT nasal spray Place 1 spray into both nostrils daily as needed for allergies.    Yes [provider]  furosemide (LASIX) 20 MG tablet Take 40 mg by mouth daily.  12/04/18  Yes [provider]  glipiZIDE (GLUCOTROL) 10 MG  tablet Take 10 mg by mouth 2 (two) times daily before a meal.  03/19/17  Yes [provider]  JANUVIA 50 MG tablet Take 50 mg by mouth daily.  11/20/18  Yes [provider]  metoprolol succinate (TOPROL-XL) 25 MG 24 hr tablet Take 1 tablet (25 mg total) by mouth daily. 06/30/19 08/03/2019 Yes Wyvonnia Dusky, MD  Multiple Vitamin (MULTI-VITAMINS) TABS Take 1 tablet by mouth daily.   Yes [provider]  nitroGLYCERIN (NITROSTAT) 0.4 MG SL tablet Place 0.4 mg under the tongue every 5 (five) minutes as needed for chest pain.  01/19/17  Yes [provider]  omeprazole (PRILOSEC) 20 MG capsule Take 20 mg by mouth daily.    Yes [provider]  predniSONE (DELTASONE) 20 MG tablet Take 2 tablets (40 mg total) by mouth daily for 5 days. 06/29/19 07/04/19 Yes Wyvonnia Dusky, MD  tiotropium (SPIRIVA HANDIHALER) 18 MCG inhalation capsule Place 18 mcg into inhaler and inhale daily.  08/01/17  Yes [provider]    Review of Systems  Constitutional: Positive for fatigue. Negative for appetite change.  HENT: Negative for congestion, rhinorrhea and sore throat.   Eyes: Negative.   Respiratory: Positive for shortness of breath (with moderate exertion (>20 yards)). Negative for cough and chest tightness.   Cardiovascular: Positive for palpitations (improving). Negative for chest pain and leg swelling.  Gastrointestinal: Negative for abdominal distention and abdominal pain.  Endocrine: Negative.   Genitourinary: Negative.   Musculoskeletal: Negative for back pain and neck pain.  Skin: Negative.   Allergic/Immunologic: Negative.   Neurological: Negative for dizziness and light-headedness.  Hematological: Negative for adenopathy. Bruises/bleeds easily.  Psychiatric/Behavioral: Negative for dysphoric mood and sleep disturbance (wearing oxygen at 2.5 L around the clock). The patient is not nervous/anxious.     Vitals:   07/01/19 0901  BP: (!) 144/50  Pulse:  79  Resp: 16  SpO2: 95%  Weight: 146 lb (66.2 kg)  Height: 5\' 2"  (1.575 m)  PF: (!) 2 L/min   Wt Readings from Last 3 Encounters:  07/01/19 146 lb (66.2 kg)  06/29/19 146 lb 6.2 oz (66.4 kg)  06/01/19 149 lb 12.8 oz (67.9 kg)   Lab Results  Component Value Date   CREATININE 1.50 (H) 06/29/2019   CREATININE 1.43 (H) 06/28/2019   CREATININE 1.49 (H) 06/27/2019     Physical Exam Vitals and nursing note reviewed.  Constitutional:      Appearance: Normal appearance.  HENT:     Head: Normocephalic and atraumatic.  Cardiovascular:     Rate and Rhythm: Normal rate and regular rhythm.  Pulmonary:     Effort: Pulmonary effort is normal. No respiratory distress.     Breath sounds: No wheezing or rales.  Abdominal:     General: There is no distension.     Palpations: Abdomen is soft.     Tenderness: There is no abdominal tenderness.  Musculoskeletal:        General: No tenderness.     Cervical back: Normal  range of motion and neck supple.     Right lower leg: No edema.     Left lower leg: No edema.  Skin:    General: Skin is warm and dry.     Findings: Bruising (bilateral arms) present.  Neurological:     General: No focal deficit present.     Mental Status: She is alert and oriented to person, place, and time.  Psychiatric:        Mood and Affect: Mood normal.        Behavior: Behavior normal.        Thought Content: Thought content normal.     Assessment & Plan:  1: Chronic heart failure with mildly reduced ejection fraction- - NYHA class II - euvolemic today - has scales but she has to ask her daughter where they are; instructed to weigh daily, write the weight down and call for an overnight weight gain of >2 pounds or a weekly weight gain of >5 pounds - not adding salt and has been reading food labels for sodium content; reviewed the importance of closely following a 2000mg  sodium diet and written dietary information along with a low sodium cookbook were given to the  patient - saw cardiology @ [facility]) 04/28/2019 - EF >40% so would not qualify for entresto; could consider titrating up metoprolol succinate in the future.  - BNP 06/22/2019 was 1179.0 - reports receiving her flu vaccine for this season  2: HTN- - BP mildly elevated here today - saw PCP Verner Chol) 06/16/2019 - BMP 06/29/19 reviewed and showed sodium 141, potassium 5.2 (recheck 4.4), creatinine 1.5 and GFR 34  3: COPD- - wears oxygen at 2.5L around the clock - has NP appointment tomorrow with pulmonology Raul Del)  4: DM- - glucose at home today was 346 as she's currently taking prednisone - A1c 05/16/2019 was 8.9%  Medication list reviewed.   Return in 2 months or sooner for any questions/problems before then.

## 2019-07-01 ENCOUNTER — Other Ambulatory Visit: Payer: Self-pay

## 2019-07-01 ENCOUNTER — Ambulatory Visit: Payer: Medicare Other | Attending: Family | Admitting: Family

## 2019-07-01 ENCOUNTER — Telehealth: Payer: Self-pay | Admitting: Family

## 2019-07-01 ENCOUNTER — Encounter: Payer: Self-pay | Admitting: Family

## 2019-07-01 VITALS — BP 144/50 | HR 79 | Resp 16 | Ht 62.0 in | Wt 146.0 lb

## 2019-07-01 DIAGNOSIS — Z7982 Long term (current) use of aspirin: Secondary | ICD-10-CM | POA: Diagnosis not present

## 2019-07-01 DIAGNOSIS — I252 Old myocardial infarction: Secondary | ICD-10-CM | POA: Insufficient documentation

## 2019-07-01 DIAGNOSIS — Z7952 Long term (current) use of systemic steroids: Secondary | ICD-10-CM | POA: Diagnosis not present

## 2019-07-01 DIAGNOSIS — D61818 Other pancytopenia: Secondary | ICD-10-CM

## 2019-07-01 DIAGNOSIS — E1122 Type 2 diabetes mellitus with diabetic chronic kidney disease: Secondary | ICD-10-CM | POA: Insufficient documentation

## 2019-07-01 DIAGNOSIS — Z7901 Long term (current) use of anticoagulants: Secondary | ICD-10-CM | POA: Diagnosis not present

## 2019-07-01 DIAGNOSIS — Z882 Allergy status to sulfonamides status: Secondary | ICD-10-CM | POA: Diagnosis not present

## 2019-07-01 DIAGNOSIS — Z7951 Long term (current) use of inhaled steroids: Secondary | ICD-10-CM | POA: Diagnosis not present

## 2019-07-01 DIAGNOSIS — Z79899 Other long term (current) drug therapy: Secondary | ICD-10-CM | POA: Diagnosis not present

## 2019-07-01 DIAGNOSIS — Z90710 Acquired absence of both cervix and uterus: Secondary | ICD-10-CM | POA: Diagnosis not present

## 2019-07-01 DIAGNOSIS — J4489 Other specified chronic obstructive pulmonary disease: Secondary | ICD-10-CM

## 2019-07-01 DIAGNOSIS — I509 Heart failure, unspecified: Secondary | ICD-10-CM | POA: Insufficient documentation

## 2019-07-01 DIAGNOSIS — I4891 Unspecified atrial fibrillation: Secondary | ICD-10-CM | POA: Insufficient documentation

## 2019-07-01 DIAGNOSIS — Z888 Allergy status to other drugs, medicaments and biological substances status: Secondary | ICD-10-CM | POA: Insufficient documentation

## 2019-07-01 DIAGNOSIS — N189 Chronic kidney disease, unspecified: Secondary | ICD-10-CM | POA: Diagnosis not present

## 2019-07-01 DIAGNOSIS — J449 Chronic obstructive pulmonary disease, unspecified: Secondary | ICD-10-CM | POA: Diagnosis not present

## 2019-07-01 DIAGNOSIS — E118 Type 2 diabetes mellitus with unspecified complications: Secondary | ICD-10-CM

## 2019-07-01 DIAGNOSIS — I130 Hypertensive heart and chronic kidney disease with heart failure and stage 1 through stage 4 chronic kidney disease, or unspecified chronic kidney disease: Secondary | ICD-10-CM | POA: Insufficient documentation

## 2019-07-01 DIAGNOSIS — I10 Essential (primary) hypertension: Secondary | ICD-10-CM

## 2019-07-01 DIAGNOSIS — E119 Type 2 diabetes mellitus without complications: Secondary | ICD-10-CM

## 2019-07-01 DIAGNOSIS — I2699 Other pulmonary embolism without acute cor pulmonale: Secondary | ICD-10-CM

## 2019-07-01 DIAGNOSIS — I5022 Chronic systolic (congestive) heart failure: Secondary | ICD-10-CM

## 2019-07-01 NOTE — Telephone Encounter (Signed)
Spoke with patient and followed up with her status since hospital d/c. Patient states she is feeling great, checking her wt daily, and has had no problems with her medications and is taking them as prescribed. She is following a [name] sodium diet and exercising when she can at home. She is having no heart failure symptoms and confirmed her appt.   Andrea [name], [place]

## 2019-07-01 NOTE — Patient Instructions (Addendum)
Resume weighing daily and call for an overnight weight gain of > 2 pounds or a weekly weight gain of >5 pounds. 

## 2019-07-18 ENCOUNTER — Inpatient Hospital Stay
Admission: EM | Admit: 2019-07-18 | Discharge: 2019-08-25 | DRG: 870 | Disposition: E | Payer: Medicare Other | Attending: Pulmonary Disease | Admitting: Pulmonary Disease

## 2019-07-18 ENCOUNTER — Other Ambulatory Visit: Payer: Self-pay

## 2019-07-18 ENCOUNTER — Emergency Department: Payer: Medicare Other

## 2019-07-18 ENCOUNTER — Encounter: Payer: Self-pay | Admitting: Emergency Medicine

## 2019-07-18 DIAGNOSIS — I5043 Acute on chronic combined systolic (congestive) and diastolic (congestive) heart failure: Secondary | ICD-10-CM | POA: Diagnosis present

## 2019-07-18 DIAGNOSIS — Z853 Personal history of malignant neoplasm of breast: Secondary | ICD-10-CM

## 2019-07-18 DIAGNOSIS — Z86711 Personal history of pulmonary embolism: Secondary | ICD-10-CM

## 2019-07-18 DIAGNOSIS — R6521 Severe sepsis with septic shock: Secondary | ICD-10-CM | POA: Diagnosis present

## 2019-07-18 DIAGNOSIS — J1282 Pneumonia due to coronavirus disease 2019: Secondary | ICD-10-CM | POA: Diagnosis present

## 2019-07-18 DIAGNOSIS — I2699 Other pulmonary embolism without acute cor pulmonale: Secondary | ICD-10-CM | POA: Diagnosis present

## 2019-07-18 DIAGNOSIS — Z7982 Long term (current) use of aspirin: Secondary | ICD-10-CM | POA: Diagnosis not present

## 2019-07-18 DIAGNOSIS — D72823 Leukemoid reaction: Secondary | ICD-10-CM | POA: Diagnosis present

## 2019-07-18 DIAGNOSIS — N2581 Secondary hyperparathyroidism of renal origin: Secondary | ICD-10-CM | POA: Diagnosis present

## 2019-07-18 DIAGNOSIS — Z66 Do not resuscitate: Secondary | ICD-10-CM | POA: Diagnosis not present

## 2019-07-18 DIAGNOSIS — I130 Hypertensive heart and chronic kidney disease with heart failure and stage 1 through stage 4 chronic kidney disease, or unspecified chronic kidney disease: Secondary | ICD-10-CM | POA: Diagnosis present

## 2019-07-18 DIAGNOSIS — J9621 Acute and chronic respiratory failure with hypoxia: Secondary | ICD-10-CM

## 2019-07-18 DIAGNOSIS — J441 Chronic obstructive pulmonary disease with (acute) exacerbation: Secondary | ICD-10-CM | POA: Diagnosis present

## 2019-07-18 DIAGNOSIS — I509 Heart failure, unspecified: Secondary | ICD-10-CM | POA: Diagnosis not present

## 2019-07-18 DIAGNOSIS — Z7951 Long term (current) use of inhaled steroids: Secondary | ICD-10-CM | POA: Diagnosis not present

## 2019-07-18 DIAGNOSIS — N183 Chronic kidney disease, stage 3 unspecified: Secondary | ICD-10-CM | POA: Diagnosis not present

## 2019-07-18 DIAGNOSIS — N170 Acute kidney failure with tubular necrosis: Secondary | ICD-10-CM | POA: Diagnosis present

## 2019-07-18 DIAGNOSIS — Z515 Encounter for palliative care: Secondary | ICD-10-CM | POA: Diagnosis not present

## 2019-07-18 DIAGNOSIS — D631 Anemia in chronic kidney disease: Secondary | ICD-10-CM | POA: Diagnosis present

## 2019-07-18 DIAGNOSIS — M109 Gout, unspecified: Secondary | ICD-10-CM | POA: Diagnosis present

## 2019-07-18 DIAGNOSIS — Z4659 Encounter for fitting and adjustment of other gastrointestinal appliance and device: Secondary | ICD-10-CM

## 2019-07-18 DIAGNOSIS — E872 Acidosis: Secondary | ICD-10-CM | POA: Diagnosis present

## 2019-07-18 DIAGNOSIS — I482 Chronic atrial fibrillation, unspecified: Secondary | ICD-10-CM | POA: Diagnosis present

## 2019-07-18 DIAGNOSIS — J440 Chronic obstructive pulmonary disease with acute lower respiratory infection: Secondary | ICD-10-CM | POA: Diagnosis present

## 2019-07-18 DIAGNOSIS — A419 Sepsis, unspecified organism: Secondary | ICD-10-CM

## 2019-07-18 DIAGNOSIS — Z951 Presence of aortocoronary bypass graft: Secondary | ICD-10-CM

## 2019-07-18 DIAGNOSIS — J9622 Acute and chronic respiratory failure with hypercapnia: Secondary | ICD-10-CM | POA: Diagnosis present

## 2019-07-18 DIAGNOSIS — Z7189 Other specified counseling: Secondary | ICD-10-CM

## 2019-07-18 DIAGNOSIS — K5900 Constipation, unspecified: Secondary | ICD-10-CM | POA: Diagnosis present

## 2019-07-18 DIAGNOSIS — Z01818 Encounter for other preprocedural examination: Secondary | ICD-10-CM

## 2019-07-18 DIAGNOSIS — I340 Nonrheumatic mitral (valve) insufficiency: Secondary | ICD-10-CM | POA: Diagnosis not present

## 2019-07-18 DIAGNOSIS — A4189 Other specified sepsis: Principal | ICD-10-CM | POA: Diagnosis present

## 2019-07-18 DIAGNOSIS — I429 Cardiomyopathy, unspecified: Secondary | ICD-10-CM | POA: Diagnosis present

## 2019-07-18 DIAGNOSIS — J9600 Acute respiratory failure, unspecified whether with hypoxia or hypercapnia: Secondary | ICD-10-CM

## 2019-07-18 DIAGNOSIS — E871 Hypo-osmolality and hyponatremia: Secondary | ICD-10-CM | POA: Diagnosis present

## 2019-07-18 DIAGNOSIS — Z7901 Long term (current) use of anticoagulants: Secondary | ICD-10-CM | POA: Diagnosis not present

## 2019-07-18 DIAGNOSIS — E875 Hyperkalemia: Secondary | ICD-10-CM | POA: Diagnosis present

## 2019-07-18 DIAGNOSIS — K219 Gastro-esophageal reflux disease without esophagitis: Secondary | ICD-10-CM | POA: Diagnosis present

## 2019-07-18 DIAGNOSIS — I5023 Acute on chronic systolic (congestive) heart failure: Secondary | ICD-10-CM | POA: Diagnosis not present

## 2019-07-18 DIAGNOSIS — Z90710 Acquired absence of both cervix and uterus: Secondary | ICD-10-CM

## 2019-07-18 DIAGNOSIS — U071 COVID-19: Secondary | ICD-10-CM | POA: Diagnosis not present

## 2019-07-18 DIAGNOSIS — Z452 Encounter for adjustment and management of vascular access device: Secondary | ICD-10-CM

## 2019-07-18 DIAGNOSIS — I361 Nonrheumatic tricuspid (valve) insufficiency: Secondary | ICD-10-CM | POA: Diagnosis not present

## 2019-07-18 DIAGNOSIS — E1122 Type 2 diabetes mellitus with diabetic chronic kidney disease: Secondary | ICD-10-CM | POA: Diagnosis present

## 2019-07-18 DIAGNOSIS — I214 Non-ST elevation (NSTEMI) myocardial infarction: Secondary | ICD-10-CM | POA: Diagnosis present

## 2019-07-18 DIAGNOSIS — D6959 Other secondary thrombocytopenia: Secondary | ICD-10-CM | POA: Diagnosis not present

## 2019-07-18 DIAGNOSIS — I2510 Atherosclerotic heart disease of native coronary artery without angina pectoris: Secondary | ICD-10-CM | POA: Diagnosis present

## 2019-07-18 DIAGNOSIS — Z79899 Other long term (current) drug therapy: Secondary | ICD-10-CM

## 2019-07-18 DIAGNOSIS — Z7984 Long term (current) use of oral hypoglycemic drugs: Secondary | ICD-10-CM | POA: Diagnosis not present

## 2019-07-18 DIAGNOSIS — E1165 Type 2 diabetes mellitus with hyperglycemia: Secondary | ICD-10-CM | POA: Diagnosis present

## 2019-07-18 DIAGNOSIS — B948 Sequelae of other specified infectious and parasitic diseases: Secondary | ICD-10-CM | POA: Diagnosis not present

## 2019-07-18 DIAGNOSIS — J984 Other disorders of lung: Secondary | ICD-10-CM

## 2019-07-18 DIAGNOSIS — N1832 Chronic kidney disease, stage 3b: Secondary | ICD-10-CM | POA: Diagnosis present

## 2019-07-18 DIAGNOSIS — J189 Pneumonia, unspecified organism: Secondary | ICD-10-CM | POA: Diagnosis not present

## 2019-07-18 DIAGNOSIS — E785 Hyperlipidemia, unspecified: Secondary | ICD-10-CM | POA: Diagnosis present

## 2019-07-18 DIAGNOSIS — I480 Paroxysmal atrial fibrillation: Secondary | ICD-10-CM | POA: Diagnosis not present

## 2019-07-18 DIAGNOSIS — L8990 Pressure ulcer of unspecified site, unspecified stage: Secondary | ICD-10-CM | POA: Insufficient documentation

## 2019-07-18 DIAGNOSIS — E118 Type 2 diabetes mellitus with unspecified complications: Secondary | ICD-10-CM | POA: Diagnosis present

## 2019-07-18 DIAGNOSIS — I5021 Acute systolic (congestive) heart failure: Secondary | ICD-10-CM | POA: Diagnosis not present

## 2019-07-18 DIAGNOSIS — R570 Cardiogenic shock: Secondary | ICD-10-CM | POA: Diagnosis not present

## 2019-07-18 DIAGNOSIS — Z9981 Dependence on supplemental oxygen: Secondary | ICD-10-CM

## 2019-07-18 DIAGNOSIS — I252 Old myocardial infarction: Secondary | ICD-10-CM

## 2019-07-18 DIAGNOSIS — Z978 Presence of other specified devices: Secondary | ICD-10-CM

## 2019-07-18 LAB — COMPREHENSIVE METABOLIC PANEL
ALT: 25 U/L (ref 0–44)
AST: 34 U/L (ref 15–41)
Albumin: 2.3 g/dL — ABNORMAL LOW (ref 3.5–5.0)
Alkaline Phosphatase: 93 U/L (ref 38–126)
Anion gap: 17 — ABNORMAL HIGH (ref 5–15)
BUN: 38 mg/dL — ABNORMAL HIGH (ref 8–23)
CO2: 21 mmol/L — ABNORMAL LOW (ref 22–32)
Calcium: 8.8 mg/dL — ABNORMAL LOW (ref 8.9–10.3)
Chloride: 95 mmol/L — ABNORMAL LOW (ref 98–111)
Creatinine, Ser: 1.78 mg/dL — ABNORMAL HIGH (ref 0.44–1.00)
GFR calc Af Amer: 32 mL/min — ABNORMAL LOW (ref >60)
GFR calc non Af Amer: 28 mL/min — ABNORMAL LOW (ref >60)
Glucose, Bld: 300 mg/dL — ABNORMAL HIGH (ref 70–99)
Potassium: 4.6 mmol/L (ref 3.5–5.1)
Sodium: 133 mmol/L — ABNORMAL LOW (ref 135–145)
Total Bilirubin: 0.7 mg/dL (ref 0.3–1.2)
Total Protein: 6.8 g/dL (ref 6.5–8.1)

## 2019-07-18 LAB — CBC WITH DIFFERENTIAL/PLATELET
Abs Immature Granulocytes: 0.08 10*3/uL — ABNORMAL HIGH (ref 0.00–0.07)
Basophils Absolute: 0.1 10*3/uL (ref 0.0–0.1)
Basophils Relative: 0 %
Eosinophils Absolute: 0.1 10*3/uL (ref 0.0–0.5)
Eosinophils Relative: 1 %
HCT: 27.7 % — ABNORMAL LOW (ref 36.0–46.0)
Hemoglobin: 8.4 g/dL — ABNORMAL LOW (ref 12.0–15.0)
Immature Granulocytes: 1 %
Lymphocytes Relative: 31 %
Lymphs Abs: 4.2 10*3/uL — ABNORMAL HIGH (ref 0.7–4.0)
MCH: 27.9 pg (ref 26.0–34.0)
MCHC: 30.3 g/dL (ref 30.0–36.0)
MCV: 92 fL (ref 80.0–100.0)
Monocytes Absolute: 1 10*3/uL (ref 0.1–1.0)
Monocytes Relative: 7 %
Neutro Abs: 8.2 10*3/uL — ABNORMAL HIGH (ref 1.7–7.7)
Neutrophils Relative %: 60 %
Platelets: 447 10*3/uL — ABNORMAL HIGH (ref 150–400)
RBC: 3.01 MIL/uL — ABNORMAL LOW (ref 3.87–5.11)
RDW: 17.1 % — ABNORMAL HIGH (ref 11.5–15.5)
WBC: 13.7 10*3/uL — ABNORMAL HIGH (ref 4.0–10.5)
nRBC: 0 % (ref 0.0–0.2)

## 2019-07-18 LAB — LACTIC ACID, PLASMA: Lactic Acid, Venous: 6.2 mmol/L (ref 0.5–1.9)

## 2019-07-18 LAB — PROTIME-INR
INR: 1.8 — ABNORMAL HIGH (ref 0.8–1.2)
Prothrombin Time: 20.7 seconds — ABNORMAL HIGH (ref 11.4–15.2)

## 2019-07-18 MED ORDER — LACTATED RINGERS IV BOLUS
1000.0000 mL | Freq: Once | INTRAVENOUS | Status: AC
  Administered 2019-07-18: 23:00:00 1000 mL via INTRAVENOUS

## 2019-07-18 MED ORDER — VANCOMYCIN HCL 500 MG/100ML IV SOLN
500.0000 mg | Freq: Once | INTRAVENOUS | Status: AC
  Administered 2019-07-19: 500 mg via INTRAVENOUS
  Filled 2019-07-18: qty 100

## 2019-07-18 MED ORDER — VANCOMYCIN HCL IN DEXTROSE 1-5 GM/200ML-% IV SOLN
1000.0000 mg | Freq: Once | INTRAVENOUS | Status: AC
  Administered 2019-07-18: 1000 mg via INTRAVENOUS
  Filled 2019-07-18: qty 200

## 2019-07-18 MED ORDER — IPRATROPIUM-ALBUTEROL 0.5-2.5 (3) MG/3ML IN SOLN: 3.0000 mL | Freq: Once | RESPIRATORY_TRACT | Status: DC

## 2019-07-18 MED ORDER — IPRATROPIUM-ALBUTEROL 0.5-2.5 (3) MG/3ML IN SOLN
3.0000 mL | RESPIRATORY_TRACT | Status: AC
  Administered 2019-07-18: 3 mL via RESPIRATORY_TRACT
  Filled 2019-07-18: qty 6

## 2019-07-18 MED ORDER — METHYLPREDNISOLONE SODIUM SUCC 125 MG IJ SOLR
125.0000 mg | Freq: Once | INTRAMUSCULAR | Status: AC
  Administered 2019-07-18: 125 mg via INTRAVENOUS
  Filled 2019-07-18: qty 2

## 2019-07-18 MED ORDER — SODIUM CHLORIDE 0.9 % IV SOLN
2.0000 g | Freq: Once | INTRAVENOUS | Status: AC
  Administered 2019-07-18: 2 g via INTRAVENOUS
  Filled 2019-07-18: qty 2

## 2019-07-18 MED ORDER — LACTATED RINGERS IV BOLUS
1000.0000 mL | Freq: Once | INTRAVENOUS | Status: AC
  Administered 2019-07-18: 1000 mL via INTRAVENOUS

## 2019-07-18 NOTE — Progress Notes (Signed)
PHARMACY -  BRIEF ANTIBIOTIC NOTE   Pharmacy has received consult(s) for Cefepime and Vancomycin from an ED provider.  The patient's profile has been reviewed for ht/wt/allergies/indication/available labs.    One time order(s) placed for Cefepime 2gm and Vancomycin 1500mg  (1gm bag + 500mg  bag)  Further antibiotics/pharmacy consults should be ordered by admitting physician if indicated.                       Thank you, Ena Dawley 07/07/2019  11:34 PM

## 2019-07-18 NOTE — H&P (Addendum)
History and Physical    Andrea [name] [id number] DOB: June 13, 1946 DOA: 07/24/2019  PCP: Perrin Maltese, MD  Patient coming from: home   Chief Complaint: shortness of breath  HPI: Andrea [name] is a 74 y.o. female with medical history significant for htn, cad s/p cabg 2020, t2dm, ckd3, copd, chf, gout, breast cancer in remission, pulmonary embolism, paroxysmal a fib, and extended recent hospital course that began with covid infection, who presents with above.  Briefly, patient was hospitalized late November with covid pneumonia, treated w/ remdes and steroids. rehospitalized mid-December with pneumonia/copd, treated with steroids and antibiotics. And again hospitalized late December for the same. Discharged on 3 L [place] O2.  She reports that she made steady improvement until about 3 days ago when began to feel more and more short of breath enough that it interfered with sleep and movement. Has a dry cough. Has had watery stools that have slowly been improving, now loose, no blood. Has had subjective fever at home. No vomiting. No dysuria. No leg swelling. Has stable 3 pillow orthopnea since her covid diagnosis. No med changes. No blood in stool. No chest pain or hemoptysis. No pleuritic pain. Has been taking apixaban.  Reports feeling much better after start of HF nasal cannula. Was hypoxic to mid-80s on arrival.  ED Course: abx, 2 L fluids, duoneb, labs, cxr, high flow nasal cannula  Review of Systems: As per HPI otherwise 10 point review of systems negative.    Past Medical History:  Diagnosis Date  . Arrhythmia    atrial fibrillation  . CHF (congestive heart failure) ([place])   . COPD (chronic obstructive pulmonary disease) ([place])   . Diabetes mellitus without complication ([place])   . Heart attack ([place])   . Hypertension   . Renal disorder     Past Surgical History:  Procedure Laterality Date  . ABDOMINAL HYSTERECTOMY    . CARDIAC SURGERY     stents     reports that she has  never smoked. She has never used smokeless tobacco. She reports that she does not drink alcohol or use drugs.  Allergies  Allergen Reactions  . Metformin And Related Diarrhea  . Sulfur Hives    History reviewed. No pertinent family history.  Prior to Admission medications   Medication Sig Start Date End Date Taking? Authorizing Provider  albuterol (PROVENTIL HFA;VENTOLIN HFA) 108 (90 Base) MCG/ACT inhaler Inhale 2 puffs into the lungs every 6 (six) hours as needed for wheezing or shortness of breath. 11/18/17   Leslye Peer, Richard, MD  amLODipine (NORVASC) 5 MG tablet Take 5 mg by mouth daily.  09/12/18   [provider]  apixaban (ELIQUIS) 5 MG TABS tablet Take 1 tablet (5 mg total) by mouth 2 (two) times daily. 06/10/19   Mercy Riding, MD  aspirin EC 81 MG tablet Take 81 mg by mouth daily.    [provider]  atorvastatin (LIPITOR) 40 MG tablet Take 40 mg by mouth at bedtime. 10/09/17   [provider]  budesonide-formoterol (SYMBICORT) 160-4.5 MCG/ACT inhaler Inhale 2 puffs into the lungs 2 (two) times daily.  06/28/16   [provider]  Cholecalciferol (VITAMIN D3) 1000 units CAPS Take 1,000 Units by mouth daily.     [provider]  ezetimibe (ZETIA) 10 MG tablet Take 10 mg by mouth daily.  04/28/19 04/27/20  [provider]  fluticasone (FLONASE) 50 MCG/ACT nasal spray Place 1 spray into both nostrils daily as needed for allergies.  [provider]  furosemide (LASIX) 20 MG tablet Take 40 mg by mouth daily.  12/04/18   [provider]  glipiZIDE (GLUCOTROL) 10 MG tablet Take 10 mg by mouth 2 (two) times daily before a meal.  03/19/17   [provider]  JANUVIA 50 MG tablet Take 50 mg by mouth daily.  11/20/18   [provider]  metoprolol succinate (TOPROL-XL) 25 MG 24 hr tablet Take 1 tablet (25 mg total) by mouth daily. 06/30/19 08/10/2019  Wyvonnia Dusky, MD  Multiple Vitamin (MULTI-VITAMINS) TABS Take 1  tablet by mouth daily.    [provider]  nitroGLYCERIN (NITROSTAT) 0.4 MG SL tablet Place 0.4 mg under the tongue every 5 (five) minutes as needed for chest pain.  01/19/17   [provider]  omeprazole (PRILOSEC) 20 MG capsule Take 20 mg by mouth daily.     [provider]  tiotropium (SPIRIVA HANDIHALER) 18 MCG inhalation capsule Place 18 mcg into inhaler and inhale daily.  08/01/17   [provider]    Physical Exam: Vitals:   07/02/2019 2139 07/26/2019 2208 07/26/2019 2233 07/21/2019 2332  BP: 126/68 (!) 120/55 (!) 107/51 (!) 114/50  Pulse: (!) 126 (!) 115 (!) 109 99  Resp: (!) 38 (!) 37 (!) 21 (!) 22  Temp:      SpO2: (!) 88% 93% 99% 100%  Weight:      Height:        Constitutional: No acute distress Head: Atraumatic Eyes: Conjunctiva clear ENM: Moist mucous membranes. poordentition.  Neck: Supple Respiratory: scattered rhonchi/wheezes, mild tachypnea, speaking in complete sentences, no use of accessorymuscles,  Cardiovascular: regular rhythmn, tachycardic, mod systolic murmur Abdomen: Non-tender, non-distended. No masses. No rebound or guarding. Positive bowel sounds. Musculoskeletal: No joint deformity upper and lower extremities. Normal ROM, no contractures. Normal muscle tone.  Skin: No rashes, lesions, or ulcers.  Extremities: No peripheral edema. Palpable peripheral pulses. Neurologic: Alert, moving all 4 extremities. Psychiatric: Normal insight and judgement.   Labs on Admission: I have personally reviewed following labs and imaging studies  CBC: Recent Labs  Lab 06/28/2019 2139  WBC 13.7*  NEUTROABS 8.2*  HGB 8.4*  HCT 27.7*  MCV 92.0  PLT 99991111*   Basic Metabolic Panel: Recent Labs  Lab 07/06/2019 2139  NA 133*  K 4.6  CL 95*  CO2 21*  GLUCOSE 300*  BUN 38*  CREATININE 1.78*  CALCIUM 8.8*   GFR: Estimated Creatinine Clearance: 25 mL/min (A) (by C-G formula based on SCr of 1.78 mg/dL (H)). Liver Function Tests: Recent  Labs  Lab 07/07/2019 2139  AST 34  ALT 25  ALKPHOS 93  BILITOT 0.7  PROT 6.8  ALBUMIN 2.3*   No results for input(s): LIPASE, AMYLASE in the last 168 hours. No results for input(s): AMMONIA in the last 168 hours. Coagulation Profile: Recent Labs  Lab 07/10/2019 2139  INR 1.8*   Cardiac Enzymes: No results for input(s): CKTOTAL, CKMB, CKMBINDEX, TROPONINI in the last 168 hours. BNP (last 3 results) No results for input(s): PROBNP in the last 8760 hours. HbA1C: No results for input(s): HGBA1C in the last 72 hours. CBG: No results for input(s): GLUCAP in the last 168 hours. Lipid Profile: No results for input(s): CHOL, HDL, LDLCALC, TRIG, CHOLHDL, LDLDIRECT in the last 72 hours. Thyroid Function Tests: No results for input(s): TSH, T4TOTAL, FREET4, T3FREE, THYROIDAB in the last 72 hours. Anemia Panel: No results for input(s): VITAMINB12, FOLATE, FERRITIN, TIBC, IRON, RETICCTPCT in the last  72 hours. Urine analysis:    Component Value Date/Time   COLORURINE YELLOW 05/16/2019 0158   APPEARANCEUR CLEAR 05/16/2019 0158   LABSPEC 1.010 05/16/2019 0158   PHURINE 6.0 05/16/2019 0158   GLUCOSEU NEGATIVE 05/16/2019 0158   HGBUR NEGATIVE 05/16/2019 0158   BILIRUBINUR NEGATIVE 05/16/2019 0158   KETONESUR NEGATIVE 05/16/2019 0158   PROTEINUR 30 (A) 05/16/2019 0158   NITRITE NEGATIVE 05/16/2019 0158   LEUKOCYTESUR TRACE (A) 05/16/2019 0158    Radiological Exams on Admission: DG Chest Portable 1 View  Result Date: 07/22/2019 CLINICAL DATA:  Shortness of breath EXAM: PORTABLE CHEST 1 VIEW COMPARISON:  June 25, 2019 FINDINGS: Again noted are pulmonary opacities bilaterally with increasing consolidation at the right base. There are small bilateral pleural effusions, left greater than right. The patient is status post prior median sternotomy. The heart size remains enlarged. There is no pneumothorax. There are prominent interstitial lung markings bilaterally. Hazy airspace opacities are  noted bilaterally IMPRESSION: 1. Bilateral airspace opacities, greatest at the right lower lobe, concerning for multifocal pneumonia. 2. Small bilateral pleural effusions, left greater than right. Electronically Signed   By: Constance Holster M.D.   On: 07/08/2019 22:15    EKG: Independently reviewed.sinus tachycardia  Assessment/Plan Principal Problem:   Pneumonia Active Problems:   CKD (chronic kidney disease) stage 3, GFR 30-59 ml/min   Pulmonary embolism on right [facility])   Multifocal pneumonia   Type 2 diabetes mellitus with complication, without long-term current use of insulin (HCC)   AF (paroxysmal atrial fibrillation) ([place])   # Multifocal pneumonia - vs copd exacerbation. Unclear to what extent this is new pneumonia vs waxing and waning course of prolonged recover of patient with covid pneumonia on underlying mod/severe COPD. Here with hypoxic respiratory failure, much improved with use of HF nasal cannula. Lactate elevated, vbg looks good. Taking anticoagulant making PE somewhat less likely. No chest pain or ecg changes to suggest MI. Mild pleural effusions but no other cxr findings or exam findings to suggest significant chf exacerbation - continue vanc/cefepime, add levofloxacin tonight - prednisone - legionella and strep urine antigen - sputum culture ordered, blood cultures drawn - cont HF nasal cannula - step down status - hiv neg last month - f/u troponin, inflammatory markers, bnp - as remains symptomatic and covid dx within last 90 days continue airborne and contact precautions - f/u procalcitonin - swallow eval - cont symbicort, schedule duonebs, albuterol prn  # t2dm - here glucose elevated. Has had recent steroids - hold home oral meds - start lantus 10 units daily, start SSI  # Anemia - normocytic, stable, denies melena - f/u stool occult blood  # CKD 3 - here with mild cr elevation from baseline. S/p 2 units IV fluids - kidney function in am  # hx pe,  paroxysmal a-fib, hfref - here in sinus - cont metoprolol, apixaban  # htn - here bps low normal in setting of acute illness - hold home amlodipine and lasix - cont atorva, zetia, asa   ADDENDUM 00:01 1/23 - alerted by EDP that approximately 20 minutes ago acute decompensation. Patient sitting up in bed struggling to breathe/speak. Intubated. Initial concern for flash pulm edema as has hx hfref and has received approximately 3 L fluids. CXR ordered. Intensivest consulted by floor coverage, patient will be transferred to ICU for higher level of care. Hemodynamically stable.   DVT prophylaxis: home apixaban Code Status: full  Family Communication: daughter or sister  Disposition Plan: tbd  Consults called:  none  Admission status: step-down    Desma Maxim MD [facility] Pager [phone]  If 7PM-7AM, please contact night-coverage [email or web address] Password [facility]  07/25/2019, 11:47 PM

## 2019-07-18 NOTE — ED Provider Notes (Signed)
[facility] Emergency Department Provider Note  ____________________________________________   First MD Initiated Contact with Patient 06/30/2019 2152     (approximate)  I have reviewed the triage vital signs and the nursing notes.   HISTORY  Chief Complaint Shortness of Breath    HPI Andrea [name] is a 74 y.o. female  With h/o DM, COPD, CHF, HTN, here with SOB. Pt has had multiple recent admissions for recurrent PNA after COVID in November. States she had been doing well until 2 days ago, when she began to develop recurrent severe SOB, cough, fatigue. She's had subjective fevers. Has had difficulty even getting out of bed. SOB is now constant, severe. She is on 2L normally but has had to go up to 4L over past 24 hours. With EMS, increased to 6L [place]. No known recurrent COVID exposures. Tested positive most recently 12/28 during admission. No abd pain, n/v/d.        Past Medical History:  Diagnosis Date  . Arrhythmia    atrial fibrillation  . CHF (congestive heart failure) ([place])   . COPD (chronic obstructive pulmonary disease) ([place])   . Diabetes mellitus without complication ([place])   . Heart attack ([place])   . Hypertension   . Renal disorder     Patient Active Problem List   Diagnosis Date Noted  . Type 2 diabetes mellitus with complication, without long-term current use of insulin ([place]) 07/01/2019  . History of 2019 novel coronavirus disease (COVID-19) 06/23/2019  . Multifocal pneumonia 06/23/2019  . Acute CHF (congestive heart failure) ([place]) 06/23/2019  . COPD with chronic bronchitis ([place]) 06/23/2019  . Non-insulin dependent type 2 diabetes mellitus ([place]) 06/23/2019  . Chronic anticoagulation 06/23/2019  . HCAP (healthcare-associated pneumonia) 05/31/2019  . Pancytopenia ([place]) 05/17/2019  . Sepsis ([place]) 05/16/2019  . Fever 05/16/2019  . Pleural effusion with transudate 09/09/2018  . Pulmonary embolism on right ([place]) 09/09/2018  . DOE (dyspnea  on exertion) 08/08/2018  . 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 Acute bronchitis with chronic obstructive pulmonary disease (COPD) ([place]) 11/15/2017  . Pneumonia 09/22/2017  . H/O solitary pulmonary nodule 04/14/2014  . Chronic right hip pain 09/01/2013  . Peptic ulcer symptoms 04/04/2013  . GERD (gastroesophageal reflux disease) 10/22/2012  . Atherosclerotic heart disease of native coronary artery without angina pectoris 10/11/2012  . Malignant neoplasm of female breast ([place]) 02/16/2011  . H/O TIA (transient ischemic attack) and stroke 11/01/2009  . CKD (chronic kidney disease) stage 3, GFR 30-59 ml/min 02/23/2009  . Hypertension, benign 11/26/2008    Past Surgical History:  Procedure Laterality Date  . ABDOMINAL HYSTERECTOMY    . CARDIAC SURGERY     stents    Prior to Admission medications   Medication Sig Start Date End Date Taking? Authorizing Provider  albuterol (PROVENTIL HFA;VENTOLIN HFA) 108 (90 Base) MCG/ACT inhaler Inhale 2 puffs into the lungs every 6 (six) hours as needed for wheezing or shortness of breath. 11/18/17   Leslye Peer, Richard, MD  amLODipine (NORVASC) 5 MG tablet Take 5 mg by mouth daily.  09/12/18   [provider]  apixaban (ELIQUIS) 5 MG TABS tablet Take 1 tablet (5 mg total) by mouth 2 (two) times daily. 06/10/19   Mercy Riding, MD  aspirin EC 81 MG tablet Take 81 mg by mouth daily.    [provider]  atorvastatin (LIPITOR) 40 MG tablet Take 40 mg by mouth at bedtime. 10/09/17   [provider]  budesonide-formoterol (SYMBICORT) 160-4.5 MCG/ACT  inhaler Inhale 2 puffs into the lungs 2 (two) times daily.  06/28/16   [provider]  Cholecalciferol (VITAMIN D3) 1000 units CAPS Take 1,000 Units by mouth daily.     [provider]  ezetimibe (ZETIA) 10 MG tablet Take 10 mg by mouth daily.  04/28/19 04/27/20  [provider]  fluticasone (FLONASE) 50 MCG/ACT nasal spray Place 1 spray into both nostrils daily as needed  for allergies.     [provider]  furosemide (LASIX) 20 MG tablet Take 40 mg by mouth daily.  12/04/18   [provider]  glipiZIDE (GLUCOTROL) 10 MG tablet Take 10 mg by mouth 2 (two) times daily before a meal.  03/19/17   [provider]  JANUVIA 50 MG tablet Take 50 mg by mouth daily.  11/20/18   [provider]  metoprolol succinate (TOPROL-XL) 25 MG 24 hr tablet Take 1 tablet (25 mg total) by mouth daily. 06/30/19 08/13/2019  Wyvonnia Dusky, MD  Multiple Vitamin (MULTI-VITAMINS) TABS Take 1 tablet by mouth daily.    [provider]  nitroGLYCERIN (NITROSTAT) 0.4 MG SL tablet Place 0.4 mg under the tongue every 5 (five) minutes as needed for chest pain.  01/19/17   [provider]  omeprazole (PRILOSEC) 20 MG capsule Take 20 mg by mouth daily.     [provider]  tiotropium (SPIRIVA HANDIHALER) 18 MCG inhalation capsule Place 18 mcg into inhaler and inhale daily.  08/01/17   [provider]    Allergies Metformin and related and Sulfur  History reviewed. No pertinent family history.  Social History Social History   Tobacco Use  . Smoking status: Never Smoker  . Smokeless tobacco: Never Used  Substance Use Topics  . Alcohol use: No  . Drug use: No    Review of Systems  Review of Systems  Constitutional: Positive for fatigue. Negative for fever.  HENT: Negative for congestion and sore throat.   Eyes: Negative for visual disturbance.  Respiratory: Positive for cough, shortness of breath and wheezing.   Cardiovascular: Negative for chest pain.  Gastrointestinal: Negative for abdominal pain, diarrhea, nausea and vomiting.  Genitourinary: Negative for flank pain.  Musculoskeletal: Negative for back pain and neck pain.  Skin: Negative for rash and wound.  Neurological: Positive for weakness.     ____________________________________________  PHYSICAL EXAM:      VITAL SIGNS: ED Triage Vitals  Enc Vitals  Group     BP 07/20/2019 2133 (!) 130/59     Pulse Rate 07/25/2019 2133 (!) 128     Resp 07/02/2019 2133 (!) 43     Temp 07/13/2019 2133 98.3 F (36.8 C)     Temp src --      SpO2 07/07/2019 2133 91 %     Weight 07/21/2019 2134 140 lb (63.5 kg)     Height 07/10/2019 2134 5\' 2"  (1.575 m)     Head Circumference --      Peak Flow --      Pain Score 07/01/2019 2134 0     Pain Loc --      Pain Edu? --      Excl. in [place]? --      Physical Exam Vitals and nursing note reviewed.  Constitutional:      General: She is not in acute distress.    Appearance: She is well-developed. She is ill-appearing.  HENT:     Head: Normocephalic and atraumatic.  Eyes:     Conjunctiva/sclera: Conjunctivae  normal.  Cardiovascular:     Rate and Rhythm: Regular rhythm. Tachycardia present.     Heart sounds: Normal heart sounds. No murmur. No friction rub.  Pulmonary:     Effort: Pulmonary effort is normal. Tachypnea present. No respiratory distress.     Breath sounds: Examination of the right-middle field reveals decreased breath sounds. Examination of the left-middle field reveals decreased breath sounds. Examination of the right-lower field reveals decreased breath sounds. Examination of the left-lower field reveals decreased breath sounds. Decreased breath sounds, rhonchi and rales present. No wheezing.  Abdominal:     General: There is no distension.     Palpations: Abdomen is soft.     Tenderness: There is no abdominal tenderness.  Musculoskeletal:     Cervical back: Neck supple.     Right lower leg: No edema.     Left lower leg: No edema.  Skin:    General: Skin is warm.     Capillary Refill: Capillary refill takes less than 2 seconds.  Neurological:     Mental Status: She is alert and oriented to person, place, and time.     Motor: No abnormal muscle tone.       ____________________________________________   LABS (all labs ordered are listed, but only abnormal results are displayed)  Labs Reviewed    COMPREHENSIVE METABOLIC PANEL - Abnormal; Notable for the following components:      Result Value   Sodium 133 (*)    Chloride 95 (*)    CO2 21 (*)    Glucose, Bld 300 (*)    BUN 38 (*)    Creatinine, Ser 1.78 (*)    Calcium 8.8 (*)    Albumin 2.3 (*)    GFR calc non Af Amer 28 (*)    GFR calc Af Amer 32 (*)    Anion gap 17 (*)    All other components within normal limits  LACTIC ACID, PLASMA - Abnormal; Notable for the following components:   Lactic Acid, Venous 6.2 (*)    All other components within normal limits  CBC WITH DIFFERENTIAL/PLATELET - Abnormal; Notable for the following components:   WBC 13.7 (*)    RBC 3.01 (*)    Hemoglobin 8.4 (*)    HCT 27.7 (*)    RDW 17.1 (*)    Platelets 447 (*)    Neutro Abs 8.2 (*)    Lymphs Abs 4.2 (*)    Abs Immature Granulocytes 0.08 (*)    All other components within normal limits  PROTIME-INR - Abnormal; Notable for the following components:   Prothrombin Time 20.7 (*)    INR 1.8 (*)    All other components within normal limits  CULTURE, BLOOD (ROUTINE X 2)  CULTURE, BLOOD (ROUTINE X 2)  LACTIC ACID, PLASMA  URINALYSIS, COMPLETE (UACMP) WITH MICROSCOPIC  BLOOD GAS, VENOUS    ____________________________________________  EKG: Sinus tachycardia, ventricular rate 129.  PR 100, QRS 104, QTc 440.  Nonspecific ST changes.  No acute ST elevations. ________________________________________  RADIOLOGY All imaging, including plain films, CT scans, and ultrasounds, independently reviewed by me, and interpretations confirmed via formal radiology reads.  ED MD interpretation:   CXR: Multifocal pneumonia  Official radiology report(s): DG Chest Portable 1 View  Result Date: 07/01/2019 CLINICAL DATA:  Shortness of breath EXAM: PORTABLE CHEST 1 VIEW COMPARISON:  June 25, 2019 FINDINGS: Again noted are pulmonary opacities bilaterally with increasing consolidation at the right base. There are small bilateral pleural effusions, left  greater  than right. The patient is status post prior median sternotomy. The heart size remains enlarged. There is no pneumothorax. There are prominent interstitial lung markings bilaterally. Hazy airspace opacities are noted bilaterally IMPRESSION: 1. Bilateral airspace opacities, greatest at the right lower lobe, concerning for multifocal pneumonia. 2. Small bilateral pleural effusions, left greater than right. Electronically Signed   By: Constance Holster M.D.   On: 07/25/2019 22:15    ____________________________________________  PROCEDURES   Procedure(s) performed (including Critical Care):  .Critical Care Performed by: Duffy Bruce, MD Authorized by: Duffy Bruce, MD   Critical care provider statement:    Critical care time (minutes):  35   Critical care time was exclusive of:  Separately billable procedures and treating other patients and teaching time   Critical care was necessary to treat or prevent imminent or life-threatening deterioration of the following conditions:  Cardiac failure, circulatory failure, respiratory failure and sepsis   Critical care was time spent personally by me on the following activities:  Development of treatment plan with patient or surrogate, discussions with consultants, evaluation of patient's response to treatment, examination of patient, obtaining history from patient or surrogate, ordering and performing treatments and interventions, ordering and review of laboratory studies, ordering and review of radiographic studies, pulse oximetry, re-evaluation of patient's condition and review of old charts   I assumed direction of critical care for this patient from another provider in my specialty: no      ____________________________________________  INITIAL IMPRESSION / MDM / [place] / ED COURSE  As part of my medical decision making, I reviewed the following data within the [place] notes reviewed and  incorporated, Old chart reviewed, Notes from prior ED visits, and [place] Controlled Substance Database       *Andrea [name] was evaluated in Emergency Department on 07/09/2019 for the symptoms described in the history of present illness. She was evaluated in the context of the global COVID-19 pandemic, which necessitated consideration that the patient might be at risk for infection with the SARS-CoV-2 virus that causes COVID-19. Institutional protocols and algorithms that pertain to the evaluation of patients at risk for COVID-19 are in a state of rapid change based on information released by regulatory bodies including the CDC and federal and state organizations. These policies and algorithms were followed during the patient's care in the ED.  Some ED evaluations and interventions may be delayed as a result of limited staffing during the pandemic.*     Medical Decision Making: 74 year old female here with recurrent acute on chronic hypoxic respiratory failure likely secondary to superimposed multifocal pneumonia.  She also recently had Covid and tested positive as recently as December 28.  Patient with marked tachypnea and increased work of breathing on exam, though she is mentating well without evidence of hypercapnia.  Will place on high flow, start breathing treatments and steroids.  Will need to be admitted to the ICU or stepdown.  Of note, patient full code during recent admission.  ____________________________________________  FINAL CLINICAL IMPRESSION(S) / ED DIAGNOSES  Final diagnoses:  Multifocal pneumonia  Acute on chronic respiratory failure with hypoxemia ([place])  Sepsis due to pneumonia [facility])     MEDICATIONS GIVEN DURING THIS VISIT:  Medications  ipratropium-albuterol (DUONEB) 0.5-2.5 (3) MG/3ML nebulizer solution 3 mL (3 mLs Nebulization Not Given 06/28/2019 2300)  ceFEPIme (MAXIPIME) 2 g in sodium chloride 0.9 % 100 mL IVPB (2 g Intravenous New Bag/Given 07/07/2019 2255)  vancomycin  (VANCOCIN) IVPB 1000 mg/200  mL premix (1,000 mg Intravenous New Bag/Given 06/28/2019 2259)  methylPREDNISolone sodium succinate (SOLU-MEDROL) 125 mg/2 mL injection 125 mg (125 mg Intravenous Given 07/09/2019 2233)  lactated ringers bolus 1,000 mL (1,000 mLs Intravenous New Bag/Given 07/10/2019 2241)  lactated ringers bolus 1,000 mL (1,000 mLs Intravenous New Bag/Given 07/03/2019 2240)     ED Discharge Orders    None       Note:  This document was prepared using Dragon voice recognition software and may include unintentional dictation errors.   Duffy Bruce, MD 07/04/2019 2302

## 2019-07-18 NOTE — ED Triage Notes (Signed)
Pt arrival via [facility] from home due to shob. Pt had covid back in November and was diagnosed with Pneumonia last week and sent home on antibiotics.   Pt is on 2.5 L of O2 at baseline, pt states she was at 88% on this when they arrived and put her on 5 L.   VS with EMS -Bp- 141/79, HR 107, BS 263, temp 98.7  5 mg albuterol given in route with EMS.

## 2019-07-19 ENCOUNTER — Inpatient Hospital Stay: Payer: Medicare Other

## 2019-07-19 LAB — URINALYSIS, COMPLETE (UACMP) WITH MICROSCOPIC
Bilirubin Urine: NEGATIVE
Glucose, UA: NEGATIVE mg/dL
Hgb urine dipstick: NEGATIVE
Ketones, ur: NEGATIVE mg/dL
Leukocytes,Ua: NEGATIVE
Nitrite: NEGATIVE
Protein, ur: 30 mg/dL — AB
Specific Gravity, Urine: 1.011 (ref 1.005–1.030)
pH: 5 (ref 5.0–8.0)

## 2019-07-19 LAB — HEPATITIS PANEL, ACUTE
HCV Ab: NONREACTIVE
Hep A IgM: NONREACTIVE
Hep B C IgM: NONREACTIVE
Hepatitis B Surface Ag: NONREACTIVE

## 2019-07-19 LAB — RESPIRATORY PANEL BY PCR

## 2019-07-19 LAB — BLOOD GAS, ARTERIAL
Acid-base deficit: 8.2 mmol/L — ABNORMAL HIGH (ref 0.0–2.0)
Bicarbonate: 22 mmol/L (ref 20.0–28.0)
FIO2: 1
MECHVT: 450 mL
Mechanical Rate: 16
O2 Saturation: 99.5 %
PEEP: 5 cmH2O
Patient temperature: 37
pCO2 arterial: 66 mmHg (ref 32.0–48.0)
pH, Arterial: 7.13 — CL (ref 7.350–7.450)
pO2, Arterial: 204 mmHg — ABNORMAL HIGH (ref 83.0–108.0)

## 2019-07-19 LAB — COMPREHENSIVE METABOLIC PANEL
ALT: 82 U/L — ABNORMAL HIGH (ref 0–44)
AST: 127 U/L — ABNORMAL HIGH (ref 15–41)
Albumin: 1.9 g/dL — ABNORMAL LOW (ref 3.5–5.0)
Alkaline Phosphatase: 122 U/L (ref 38–126)
Anion gap: 12 (ref 5–15)
BUN: 43 mg/dL — ABNORMAL HIGH (ref 8–23)
CO2: 23 mmol/L (ref 22–32)
Calcium: 8.1 mg/dL — ABNORMAL LOW (ref 8.9–10.3)
Chloride: 97 mmol/L — ABNORMAL LOW (ref 98–111)
Creatinine, Ser: 1.82 mg/dL — ABNORMAL HIGH (ref 0.44–1.00)
GFR calc Af Amer: 32 mL/min — ABNORMAL LOW (ref >60)
GFR calc non Af Amer: 27 mL/min — ABNORMAL LOW (ref >60)
Glucose, Bld: 426 mg/dL — ABNORMAL HIGH (ref 70–99)
Potassium: 5.4 mmol/L — ABNORMAL HIGH (ref 3.5–5.1)
Sodium: 132 mmol/L — ABNORMAL LOW (ref 135–145)
Total Bilirubin: 1.1 mg/dL (ref 0.3–1.2)
Total Protein: 6.2 g/dL — ABNORMAL LOW (ref 6.5–8.1)

## 2019-07-19 LAB — C-REACTIVE PROTEIN: CRP: 12.6 mg/dL — ABNORMAL HIGH (ref <1.0)

## 2019-07-19 LAB — HEMOGLOBIN A1C
Hgb A1c MFr Bld: 7.9 % — ABNORMAL HIGH (ref 4.8–5.6)
Mean Plasma Glucose: 180.03 mg/dL

## 2019-07-19 LAB — TROPONIN I (HIGH SENSITIVITY)
Troponin I (High Sensitivity): 90 ng/L — ABNORMAL HIGH (ref <18)
Troponin I (High Sensitivity): 599 ng/L (ref <18)
Troponin I (High Sensitivity): 3060 ng/L (ref <18)
Troponin I (High Sensitivity): 2897 ng/L
Troponin I (High Sensitivity): 3009 ng/L

## 2019-07-19 LAB — FIBRIN DERIVATIVES D-DIMER (ARMC ONLY): Fibrin derivatives D-dimer (ARMC): 4520.7 ng/mL (FEU) — ABNORMAL HIGH (ref 0.00–499.00)

## 2019-07-19 LAB — GLUCOSE, CAPILLARY
Glucose-Capillary: 189 mg/dL — ABNORMAL HIGH (ref 70–99)
Glucose-Capillary: 283 mg/dL — ABNORMAL HIGH (ref 70–99)
Glucose-Capillary: 342 mg/dL — ABNORMAL HIGH (ref 70–99)
Glucose-Capillary: 383 mg/dL — ABNORMAL HIGH (ref 70–99)
Glucose-Capillary: 442 mg/dL — ABNORMAL HIGH (ref 70–99)
Glucose-Capillary: 499 mg/dL — ABNORMAL HIGH (ref 70–99)

## 2019-07-19 LAB — BRAIN NATRIURETIC PEPTIDE: B Natriuretic Peptide: 1757 pg/mL — ABNORMAL HIGH (ref 0.0–100.0)

## 2019-07-19 LAB — PROCALCITONIN
Procalcitonin: 1.11 ng/mL
Procalcitonin: 1.18 ng/mL

## 2019-07-19 LAB — MRSA PCR SCREENING: MRSA by PCR: NEGATIVE

## 2019-07-19 LAB — LACTIC ACID, PLASMA: Lactic Acid, Venous: 2.8 mmol/L (ref 0.5–1.9)

## 2019-07-19 MED ORDER — ASPIRIN EC 81 MG PO TBEC: 81.0000 mg | DELAYED_RELEASE_TABLET | Freq: Every day | ORAL | Status: DC

## 2019-07-19 MED ORDER — PROPOFOL 1000 MG/100ML IV EMUL
INTRAVENOUS | Status: AC
  Filled 2019-07-19: qty 100

## 2019-07-19 MED ORDER — CHLORHEXIDINE GLUCONATE 0.12% ORAL RINSE (MEDLINE KIT)
15.0000 mL | Freq: Two times a day (BID) | OROMUCOSAL | Status: DC
  Administered 2019-07-19 – 2019-08-01 (×26): 15 mL via OROMUCOSAL

## 2019-07-19 MED ORDER — DEXMEDETOMIDINE HCL IN NACL 400 MCG/100ML IV SOLN
0.4000 ug/kg/h | INTRAVENOUS | Status: DC
  Administered 2019-07-19: 21:00:00 0.5 ug/kg/h via INTRAVENOUS
  Administered 2019-07-20: 0.8 ug/kg/h via INTRAVENOUS
  Administered 2019-07-20: 1 ug/kg/h via INTRAVENOUS
  Filled 2019-07-19 (×3): qty 100

## 2019-07-19 MED ORDER — FAMOTIDINE IN NACL 20-0.9 MG/50ML-% IV SOLN
20.0000 mg | INTRAVENOUS | Status: DC
  Administered 2019-07-19 – 2019-07-21 (×3): 20 mg via INTRAVENOUS
  Filled 2019-07-19 (×3): qty 50

## 2019-07-19 MED ORDER — SUCCINYLCHOLINE CHLORIDE 20 MG/ML IJ SOLN
120.0000 mg | Freq: Once | INTRAMUSCULAR | Status: AC
  Administered 2019-07-19: 120 mg via INTRAVENOUS

## 2019-07-19 MED ORDER — MIDAZOLAM 50MG/50ML (1MG/ML) PREMIX INFUSION
0.5000 mg/h | INTRAVENOUS | Status: DC
  Administered 2019-07-19: 03:00:00 0.5 mg/h via INTRAVENOUS
  Administered 2019-07-19: 10:00:00 3.5 mg/h via INTRAVENOUS
  Filled 2019-07-19 (×2): qty 50

## 2019-07-19 MED ORDER — FENTANYL BOLUS VIA INFUSION
50.0000 ug | INTRAVENOUS | Status: DC | PRN
  Administered 2019-07-19: 50 ug via INTRAVENOUS
  Administered 2019-07-20: 75 ug via INTRAVENOUS
  Filled 2019-07-19: qty 100

## 2019-07-19 MED ORDER — FENTANYL BOLUS VIA INFUSION
25.0000 ug | INTRAVENOUS | Status: DC | PRN
  Administered 2019-07-19 (×2): 25 ug via INTRAVENOUS
  Filled 2019-07-19: qty 25

## 2019-07-19 MED ORDER — ALBUTEROL SULFATE (2.5 MG/3ML) 0.083% IN NEBU: 2.5000 mg | INHALATION_SOLUTION | RESPIRATORY_TRACT | Status: DC | PRN

## 2019-07-19 MED ORDER — CHLORHEXIDINE GLUCONATE CLOTH 2 % EX PADS
6.0000 | MEDICATED_PAD | Freq: Every day | CUTANEOUS | Status: DC
  Administered 2019-07-20 – 2019-08-01 (×12): 6 via TOPICAL

## 2019-07-19 MED ORDER — ATORVASTATIN CALCIUM 20 MG PO TABS
40.0000 mg | ORAL_TABLET | Freq: Every day | ORAL | Status: DC
  Administered 2019-07-19 – 2019-07-31 (×13): 40 mg
  Filled 2019-07-19 (×13): qty 2

## 2019-07-19 MED ORDER — LACTATED RINGERS IV BOLUS
1000.0000 mL | Freq: Once | INTRAVENOUS | Status: AC
  Administered 2019-07-19: 10:00:00 1000 mL via INTRAVENOUS

## 2019-07-19 MED ORDER — MOMETASONE FURO-FORMOTEROL FUM 200-5 MCG/ACT IN AERO
2.0000 | INHALATION_SPRAY | Freq: Two times a day (BID) | RESPIRATORY_TRACT | Status: DC
  Filled 2019-07-19: qty 8.8

## 2019-07-19 MED ORDER — LEVOFLOXACIN IN D5W 750 MG/150ML IV SOLN
750.0000 mg | INTRAVENOUS | Status: DC
  Administered 2019-07-19 – 2019-07-21 (×2): 750 mg via INTRAVENOUS
  Filled 2019-07-19 (×2): qty 150

## 2019-07-19 MED ORDER — INSULIN ASPART 100 UNIT/ML ~~LOC~~ SOLN: 3.0000 [IU] | SUBCUTANEOUS | Status: DC

## 2019-07-19 MED ORDER — INSULIN ASPART 100 UNIT/ML ~~LOC~~ SOLN
25.0000 [IU] | Freq: Once | SUBCUTANEOUS | Status: AC
  Administered 2019-07-19: 07:00:00 25 [IU] via SUBCUTANEOUS

## 2019-07-19 MED ORDER — FUROSEMIDE 10 MG/ML IJ SOLN
40.0000 mg | Freq: Once | INTRAMUSCULAR | Status: AC
  Administered 2019-07-19: 04:00:00 40 mg via INTRAVENOUS
  Filled 2019-07-19: qty 4

## 2019-07-19 MED ORDER — FENTANYL 2500MCG IN NS 250ML (10MCG/ML) PREMIX INFUSION
0.0000 ug/h | INTRAVENOUS | Status: DC
  Administered 2019-07-19: 25 ug/h via INTRAVENOUS
  Filled 2019-07-19 (×2): qty 250

## 2019-07-19 MED ORDER — ORAL CARE MOUTH RINSE
15.0000 mL | OROMUCOSAL | Status: DC
  Administered 2019-07-19 – 2019-08-01 (×128): 15 mL via OROMUCOSAL

## 2019-07-19 MED ORDER — VANCOMYCIN VARIABLE DOSE PER UNSTABLE RENAL FUNCTION (PHARMACIST DOSING): Status: DC

## 2019-07-19 MED ORDER — APIXABAN 5 MG PO TABS
5.0000 mg | ORAL_TABLET | Freq: Two times a day (BID) | ORAL | Status: DC
  Administered 2019-07-19 – 2019-07-20 (×4): 5 mg via ORAL
  Filled 2019-07-19 (×4): qty 1

## 2019-07-19 MED ORDER — INSULIN ASPART 100 UNIT/ML ~~LOC~~ SOLN: 0.0000 [IU] | Freq: Three times a day (TID) | SUBCUTANEOUS | Status: DC

## 2019-07-19 MED ORDER — SODIUM CHLORIDE 0.9 % IV SOLN: 250.0000 mL | INTRAVENOUS | Status: DC

## 2019-07-19 MED ORDER — IPRATROPIUM-ALBUTEROL 0.5-2.5 (3) MG/3ML IN SOLN
3.0000 mL | Freq: Four times a day (QID) | RESPIRATORY_TRACT | Status: DC
  Administered 2019-07-19 – 2019-07-24 (×21): 3 mL via RESPIRATORY_TRACT
  Filled 2019-07-19 (×21): qty 3

## 2019-07-19 MED ORDER — ETOMIDATE 2 MG/ML IV SOLN
20.0000 mg | Freq: Once | INTRAVENOUS | Status: AC
  Administered 2019-07-19: 01:00:00 20 mg via INTRAVENOUS

## 2019-07-19 MED ORDER — EZETIMIBE 10 MG PO TABS
10.0000 mg | ORAL_TABLET | Freq: Every day | ORAL | Status: DC
  Administered 2019-07-19 – 2019-07-23 (×5): 10 mg
  Filled 2019-07-19 (×6): qty 1

## 2019-07-19 MED ORDER — SODIUM CHLORIDE 0.9 % IV SOLN: 2.0000 g | INTRAVENOUS | Status: DC

## 2019-07-19 MED ORDER — TECHNETIUM TO 99M ALBUMIN AGGREGATED
4.1600 | Freq: Once | INTRAVENOUS | Status: AC | PRN
  Administered 2019-07-19: 18:00:00 4.16 via INTRAVENOUS

## 2019-07-19 MED ORDER — INSULIN GLARGINE 100 UNIT/ML ~~LOC~~ SOLN
15.0000 [IU] | Freq: Every day | SUBCUTANEOUS | Status: DC
  Filled 2019-07-19: qty 0.15

## 2019-07-19 MED ORDER — INSULIN ASPART 100 UNIT/ML ~~LOC~~ SOLN
3.0000 [IU] | SUBCUTANEOUS | Status: DC
  Administered 2019-07-19 – 2019-07-20 (×2): 3 [IU] via SUBCUTANEOUS
  Filled 2019-07-19 (×2): qty 1

## 2019-07-19 MED ORDER — METOPROLOL SUCCINATE ER 50 MG PO TB24: 25.0000 mg | ORAL_TABLET | Freq: Every day | ORAL | Status: DC

## 2019-07-19 MED ORDER — INSULIN ASPART 100 UNIT/ML ~~LOC~~ SOLN
0.0000 [IU] | SUBCUTANEOUS | Status: DC
  Administered 2019-07-19 (×2): 20 [IU] via SUBCUTANEOUS
  Administered 2019-07-19: 17:00:00 15 [IU] via SUBCUTANEOUS
  Administered 2019-07-19: 20:00:00 11 [IU] via SUBCUTANEOUS
  Administered 2019-07-20 (×2): 4 [IU] via SUBCUTANEOUS
  Administered 2019-07-20: 3 [IU] via SUBCUTANEOUS
  Filled 2019-07-19 (×8): qty 1

## 2019-07-19 MED ORDER — EZETIMIBE 10 MG PO TABS
10.0000 mg | ORAL_TABLET | Freq: Every day | ORAL | Status: DC
  Filled 2019-07-19: qty 1

## 2019-07-19 MED ORDER — PROPOFOL 1000 MG/100ML IV EMUL
5.0000 ug/kg/min | INTRAVENOUS | Status: DC
  Administered 2019-07-19: 5 ug/kg/min via INTRAVENOUS

## 2019-07-19 MED ORDER — SODIUM CHLORIDE 0.9 % IV SOLN: INTRAVENOUS | Status: DC

## 2019-07-19 MED ORDER — NOREPINEPHRINE 4 MG/250ML-% IV SOLN
2.0000 ug/min | INTRAVENOUS | Status: DC
  Administered 2019-07-19: 21:00:00 7 ug/min via INTRAVENOUS
  Administered 2019-07-19: 2 ug/min via INTRAVENOUS
  Administered 2019-07-19: 8 ug/min via INTRAVENOUS
  Administered 2019-07-20: 09:00:00 5 ug/min via INTRAVENOUS
  Filled 2019-07-19 (×4): qty 250

## 2019-07-19 MED ORDER — MIDODRINE HCL 5 MG PO TABS
10.0000 mg | ORAL_TABLET | Freq: Three times a day (TID) | ORAL | Status: DC
  Administered 2019-07-19 – 2019-08-01 (×40): 10 mg via ORAL
  Filled 2019-07-19 (×40): qty 2

## 2019-07-19 MED ORDER — PANTOPRAZOLE SODIUM 40 MG PO TBEC: 40.0000 mg | DELAYED_RELEASE_TABLET | Freq: Every day | ORAL | Status: DC

## 2019-07-19 MED ORDER — CHLORHEXIDINE GLUCONATE CLOTH 2 % EX PADS: 6.0000 | MEDICATED_PAD | Freq: Every day | CUTANEOUS | Status: DC

## 2019-07-19 MED ORDER — INSULIN GLARGINE 100 UNIT/ML ~~LOC~~ SOLN
10.0000 [IU] | Freq: Every day | SUBCUTANEOUS | Status: DC
  Administered 2019-07-19: 10 [IU] via SUBCUTANEOUS
  Filled 2019-07-19 (×3): qty 0.1

## 2019-07-19 MED ORDER — ATORVASTATIN CALCIUM 20 MG PO TABS: 40.0000 mg | ORAL_TABLET | Freq: Every day | ORAL | Status: DC

## 2019-07-19 MED ORDER — INSULIN ASPART 100 UNIT/ML ~~LOC~~ SOLN: 5.0000 [IU] | SUBCUTANEOUS | Status: DC

## 2019-07-19 MED ORDER — METHYLPREDNISOLONE SODIUM SUCC 40 MG IJ SOLR
40.0000 mg | Freq: Two times a day (BID) | INTRAMUSCULAR | Status: DC
  Administered 2019-07-19: 40 mg via INTRAVENOUS
  Filled 2019-07-19: qty 1

## 2019-07-19 MED ORDER — INSULIN GLARGINE 100 UNIT/ML ~~LOC~~ SOLN
10.0000 [IU] | Freq: Two times a day (BID) | SUBCUTANEOUS | Status: DC
  Administered 2019-07-19 – 2019-07-22 (×6): 10 [IU] via SUBCUTANEOUS
  Filled 2019-07-19 (×9): qty 0.1

## 2019-07-19 MED ORDER — PREDNISONE 20 MG PO TABS: 60.0000 mg | ORAL_TABLET | Freq: Every day | ORAL | Status: DC

## 2019-07-19 MED ORDER — SODIUM BICARBONATE 8.4 % IV SOLN
50.0000 meq | Freq: Once | INTRAVENOUS | Status: DC
  Filled 2019-07-19: qty 50

## 2019-07-19 NOTE — ED Notes (Signed)
Date and time results received: 01/23/213:47 AM  CRITICAL VALUE   Test: PH: 7.13          CO2: 66         PO2: 204          BICARB: 22         Name of Provider Notified: Stark Klein, NP

## 2019-07-19 NOTE — Progress Notes (Signed)
Updated pts grandson regarding plan of care and all questions were answered.   Andrea [name], [place] Pager [phone] (please enter 7 digits) PCCM Consult Pager [phone] (please enter 7 digits)

## 2019-07-19 NOTE — Progress Notes (Signed)
Due to no [facility] ICU bed availability hospitalist team working on transferring pt to [place], [place] Pager [phone] (please enter 7 digits) PCCM Consult Pager [phone] (please enter 7 digits)

## 2019-07-19 NOTE — ED Notes (Signed)
Hospitalist notified of pts rapid decline, MD Wouk at bedside

## 2019-07-19 NOTE — ED Notes (Signed)
RT, EDP and additional supportive staff at bedside for intubation assistance.   Dawn RN to administer the following:  20 mg Etomidate  @ 00:45 120mg  Succinylcholine @ 00:46  Pt succesfully intubated at 00:46 without difficulty. Positive color change noted @ 00:47

## 2019-07-19 NOTE — ED Notes (Signed)
Family updated as to patient's status.

## 2019-07-19 NOTE — ED Notes (Signed)
Propofol increased, to aide in sedation

## 2019-07-19 NOTE — ED Notes (Signed)
Pt c/o increased diff. Breathing. O2 stat reads 100% on the monitor. Pt has HFNC in place. Pt educated on supportive breathing techniques

## 2019-07-19 NOTE — ED Notes (Signed)
Pt started on 1mcg of Propofol @ 00:57  To be titrated for sedation/.

## 2019-07-19 NOTE — ED Notes (Signed)
RT called due to pt's oxygen desat 89-92%. Hospitalist paged. EDP at bedside

## 2019-07-19 NOTE — ED Notes (Signed)
Patient's daughter, [name] notified of change in condition and need to intubate patient.  Daughter verbalized understanding.

## 2019-07-19 NOTE — ED Notes (Signed)
Attempted to call report, floor unable to accept at this time. Per ICU Charge Doroteo Bradford, they will call back when able to take report ED Charge RN made aware

## 2019-07-19 NOTE — Progress Notes (Signed)
Dr. Lanney Gins notified of critical D-dimer and Troponin levels. New orders placed.

## 2019-07-19 NOTE — ED Notes (Signed)
Pt still responding to voice and moving eyes/head//limbs. EDP at bedside, orders to increase sedation at this time, See [facility]

## 2019-07-19 NOTE — ED Notes (Addendum)
Hospitalist at bedside for further evaluation regarding need for increased sedation. Pt moving eyes/ limbs and responding to voice at this time/   Propofol rate increased, See [facility]

## 2019-07-19 NOTE — ED Notes (Signed)
MD notified for the need to increase sedation/. See MAR.

## 2019-07-19 NOTE — Progress Notes (Signed)
Dr. Novella Olive ID called to confirm that airborne precautions for covid are not required as it has been 21 days post positive result. Per MD pt does not require airborne. Pt will be on droplet precautions for pending respiratory panel.

## 2019-07-19 NOTE — ED Notes (Signed)
Pt sedated at this time, Propofol was decreased to prevent hypotension. See [facility]

## 2019-07-19 NOTE — Progress Notes (Signed)
Dr. Lanney Gins notified of pt blood sugars, orders to increase lantus to 15 units.

## 2019-07-19 NOTE — Progress Notes (Signed)
Pharmacy Antibiotic Note  Andrea [name] is a 74 y.o. female admitted on 07/06/2019 with sepsis.  Pharmacy has been consulted for Vancomycin and Cefepime dosing.  Cefepime 2gm and Vancomycin 1500mg  given in ED  Plan: Cefepime 2gm IV q24hrs Variable vancomycin dosing per levels due to elevated SCr  Height: 5\' 2"  (157.5 cm) Weight: 140 lb (63.5 kg) IBW/kg (Calculated) : 50.1  Temp (24hrs), Avg:98.3 F (36.8 C), Min:98.3 F (36.8 C), Max:98.3 F (36.8 C)  Recent Labs  Lab 07/10/2019 2139 07/10/2019 2343  WBC 13.7*  --   CREATININE 1.78*  --   LATICACIDVEN 6.2* 2.8*    Estimated Creatinine Clearance: 25 mL/min (A) (by C-G formula based on SCr of 1.78 mg/dL (H)).    Allergies  Allergen Reactions  . Metformin And Related Diarrhea  . Sulfur Hives    Antimicrobials this admission: Vancomycin 1/22 >>  Cefepime 1/22 >>   Dose adjustments this admission:   Microbiology results:  BCx:   UCx:    Sputum:    MRSA PCR:   Thank you for allowing pharmacy to be a part of this patient's care.  Hart Robinsons A 07/19/2019 3:49 AM

## 2019-07-19 NOTE — Progress Notes (Signed)
Patient vent mode found on Volume Control at beginning of shift, however, patient is not tolerating mode well. Patient is sedated and positioned comfortably in bed. Per NP, ventilator switched back to previous mode of PRVC 450VT, 16RR, 5 PEEP and weaned to 36% FIO2. Patient tolerating PRVC well. No complications or distress noted.

## 2019-07-19 NOTE — ED Notes (Addendum)
Pt becoming increasingly anxious at this time regarding breathing, unable to redirect/ EDP Owens Shark notified

## 2019-07-19 NOTE — ED Notes (Signed)
Pt experienced a rapid decline in oxygenation status, EDP Brown at bedside, orders to intubate at this time given pts increasing tachypnea and dyspnea. Pt verbalized understanding & purpose of placing advanced airway   (Pt gave verbal permission to proceed with intubation with this RN as witness)   Pts' RR: 34          O2: 65%   BP & Pulse WNL

## 2019-07-19 NOTE — Progress Notes (Signed)
Dr. Lanney Gins called for CBG of 499. Per MD give 20 of Novolog and 10 units of Lantus subcutaneous and re-check blood sugar in 4 hours.

## 2019-07-19 NOTE — Progress Notes (Signed)
    BRIEF OVERNIGHT PROGRESS REPORT   SUBJECTIVE: Patient admitted with severe shortness of breath, cough, fatigue commend fevers on 2 L at home but now requiring 4 L over the past 24 hours.  She remained on 6 L via nasal cannula until around 12:35 AM when staff noted patient was having severe respiratory distress, tripoding, tachypneic, with use of accessory respiratory muscles for breathing.  Given the rapid decompensation over the last 5 minutes patient was intubated to secure her airway.  OBJECTIVE: Patient assessed at the bedside. She is afebrile with blood pressure 129/99 mm Hg and pulse rate 102 beats/min. There were no focal neurological deficits; she is intubated and sedated therefore unable to assess orientation.  ASSESSMENT: 74 year old female past medical history of COPD, CAD s/p CABG, CKD stage III, CHF, diabetes mellitus type 2, paroxysmal atrial fibrillation and pulmonary embolism 08/2018 on apixaban and recent COVID-19 infection leading to the ED with complaints of shortness of breath, cough and subjective fever.  PLAN: 1. Acute on chronic respiratory failure with hypoxia -likely multifactorial in the setting of persistent multifocal pneumonia (Covid 19+, has been positive for over 21 days), COPD exacerbation and acute on chronic CHF exacerbation. -Transfer to ICU - Vent management per ICU team - Will give dose of IV Lasix 40 mg x 1 days for possible flash pulmonary edema - Discussed with PCCM at [facility], given that patient has had COVID-19 >21 days unlikely that she will benefit from transfer to [facility] at this time. Consult placed to intensivist  2. Persistent multifocal pneumonia -previously treated with full course of antibiotics - Continue Vanco/cefepime, levofloxacin per admitting - Continue with prednisone - Trend troponin, inflammatory markers, CBC and CMP, follow-up procalcitonin - Blood cultures pending - Legionella and strep urine antigen and sputum culture pending  3.  Acute on chronic combined CHF exacerbation  BNP elevated at 1757   Last Echo 06/23/2019 EF 40-45% &grade II diastolic dysfunction - Hold ACE inhibitors in setting of AKI - Continue metoprolol as BP allows - Diuretics: Furosemide 40mg  IV BID. Diureses >1L negative per day until approach euvolemia / worsening renal function. - Low salt diet  - Check daily weight - Strict I&Os  4. Elevated troponin: in patient with history of CAD s/p CABG. Likely secondary to demand ischemia. - Continue to trend troponin  5. Acute on chronic CKD stage III -creatinine elevated above baseline -Hold nephrotoxins -Continue to monitor renal function  6. Acute on chronic COPD exacerbation - Continue bronchodilators and IV steroids  7. Paroxysmal atrial fibrillation - Continue anticoagulation with apixaban - Rate controlled on metoprolol      Rufina Falco, DNP, CCRN, FNP-C [facility] Nurse Practitioner Between 7pm to [place] - Pager [phone]  After 7am go to [email or web address] - [email or web address] [facility]  [facility] [employer]  [phone]

## 2019-07-19 NOTE — Progress Notes (Signed)
Pt transported to ICU 20 on vent without incident. Report given to ICU RT.

## 2019-07-19 NOTE — Consult Note (Signed)
Name: Andrea [name] MRN: [id number] DOB: 11-12-1945    ADMISSION DATE:  07/03/2019 CONSULTATION DATE: 07/19/2019  REFERRING MD : Rufina Falco, NP  CHIEF COMPLAINT: Shortness of Breath   BRIEF PATIENT DESCRIPTION:  74 yo female dx with COVID-19 04/2019 admitted with acute on chronic renal failure with hyperkalemia, elevated troponin, and acute on chronic hypoxic hypercapnic respiratory failure secondary to bacterial pneumonia vs. COVID-19 pneumonitis requiring mechanical intubation   SIGNIFICANT EVENTS/STUDIES:  01/22: Pt admitted to the stepdown unit with acute hypoxic respiratory failure secondary to multifocal pneumonia, however remained in the ER pending bed availability 01/23: Pt developed worsening acute hypoxic respiratory failure requiring emergent mechanical intubation in the ER.  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 She presented to [facility] ER via EMS on 01/22 with worsening shortness of breath, cough, and fatigue.  At baseline pt wears chronic home O2 @2L , however due to symptoms she has had to increase her O2 to 4L 24hrs prior to current ER presentation.  En route to the ER EMS placed pt on 6L O2 and administered albuterol.  Upon arrival to the ER pt tachypneic with increased work of breathing requiring iv steroids, duonebs, and HFNC.  Lab results revealed Na+ 133, CO2 21, BUN 38, creatinine 1.78, glucose 300, anion gap 17, troponin 90, lactic acid 6.2, pct 1.11, wbc 13.7, hgb 8.4, and vbg pH 7.38/pCO2 42.  CXR concerning for multifocal pneumonia and small bilateral pleural effusions.  She received solumedrol, vancomycin, and cefepime.  She was subsequently admitted to the  stepdown unit per hospitalist team for additional workup and treatment, however remained in the ER pending bed availability.  On 01/23 pt developed worsening acute hypoxic respiratory failure possibly due to flash pulmonary edema after receiving a total of 3L of fluids requiring mechanical intubation.  PCCM contacted to assume care.  Pt does have a hx of COVID-19 diagnosed 05/14/2020, which required hospitalization/treatment at [facility]. Pt discharged home on 05/20/2019.  However, she required hospitalization again at [facility] on 05/31/2019 due to multifocal pneumonia, following treatment she was discharged home on 06/29/2019.   PAST MEDICAL HISTORY :   has a past medical history of Arrhythmia, CHF (congestive heart failure) ([place]), COPD (chronic obstructive pulmonary disease) ([place]), Diabetes mellitus without complication ([place]), Heart attack ([place]), Hypertension, and Renal disorder.  has a past surgical history that includes Cardiac surgery and Abdominal hysterectomy. Prior to Admission medications   Medication Sig Start Date End Date Taking? Authorizing Provider  albuterol (PROVENTIL HFA;VENTOLIN HFA) 108 (90 Base) MCG/ACT inhaler Inhale 2 puffs into the lungs every 6 (six) hours as needed for wheezing or shortness of breath. 11/18/17   Leslye Peer, Richard, MD  amLODipine (NORVASC) 5 MG tablet Take 5 mg by mouth daily.  09/12/18   [provider]  apixaban (ELIQUIS) 5 MG TABS tablet Take 1 tablet (5 mg total) by mouth 2 (two) times daily. 06/10/19   Mercy Riding, MD  aspirin EC 81 MG tablet Take 81 mg by mouth daily.    [provider]  atorvastatin (LIPITOR) 40 MG tablet Take 40 mg by mouth at bedtime. 10/09/17   [provider]  budesonide-formoterol (SYMBICORT) 160-4.5 MCG/ACT inhaler Inhale  2 puffs into the lungs 2 (two) times daily.  06/28/16   [provider]  Cholecalciferol (VITAMIN D3) 1000 units CAPS Take 1,000 Units by mouth daily.     [provider]  ezetimibe (ZETIA) 10 MG tablet Take 10 mg by mouth daily.  04/28/19 04/27/20  [provider]  fluticasone (FLONASE) 50 MCG/ACT nasal spray Place 1 spray into both nostrils daily as needed for allergies.     [provider]  furosemide (LASIX) 20 MG tablet Take 40 mg by mouth daily.  12/04/18   [provider]  glipiZIDE (GLUCOTROL) 10 MG tablet Take 10 mg by mouth 2 (two) times daily before a meal.  03/19/17   [provider]  JANUVIA 50 MG tablet Take 50 mg by mouth daily.  11/20/18   [provider]  metoprolol succinate (TOPROL-XL) 25 MG 24 hr tablet Take 1 tablet (25 mg total) by mouth daily. 06/30/19 07/31/2019  Wyvonnia Dusky, MD  Multiple Vitamin (MULTI-VITAMINS) TABS Take 1 tablet by mouth daily.    [provider]  nitroGLYCERIN (NITROSTAT) 0.4 MG SL tablet Place 0.4 mg under the tongue every 5 (five) minutes as needed for chest pain.  01/19/17   [provider]  omeprazole (PRILOSEC) 20 MG capsule Take 20 mg by mouth daily.     [provider]  tiotropium (SPIRIVA HANDIHALER) 18 MCG inhalation capsule Place 18 mcg into inhaler and inhale daily.  08/01/17   [provider]   Allergies  Allergen Reactions   Metformin And Related Diarrhea   Sulfur Hives    FAMILY HISTORY:  family history is not on file. SOCIAL HISTORY:  reports that she has never smoked. She has never used smokeless tobacco. She reports that she does not drink alcohol or use drugs.  REVIEW OF SYSTEMS:   Unable to assess pt intubated   SUBJECTIVE:  Unable to assess pt intubated   VITAL SIGNS: Temp:  [98.3 F (36.8 C)] 98.3 F (36.8 C) (01/22 2133) Pulse Rate:  [50-128] 96 (01/23 0412) Resp:  [16-43] 31 (01/23 0412) BP: (106-156)/(50-99) 128/59 (01/23 0412) SpO2:  [86 %-100 %] 100 % (01/23 0412) FiO2 (%):  [50 %-100 %] 100 % (01/23 0238) Weight:  [63.5 kg] 63.5 kg (01/22 2134)  PHYSICAL EXAMINATION: General: acutely ill  appearing female, NAD mechanically intubated  Neuro: alert, follows commands, PERRL   HEENT: supple, mild JVD  Cardiovascular: sinus rhythm, rrr, no R/G Lungs: diminished throughout, even, non labored  Abdomen: +BS x4, soft, non distended  Musculoskeletal: normal bulk and tone, no edema  Skin: intact no rashes or lesions present   Recent Labs  Lab 07/01/2019 2139  NA 133*  K 4.6  CL 95*  CO2 21*  BUN 38*  CREATININE 1.78*  GLUCOSE 300*   Recent Labs  Lab 07/17/2019 2139  HGB 8.4*  HCT 27.7*  WBC 13.7*  PLT 447*   DG Abdomen 1 View  Result Date: 07/19/2019 CLINICAL DATA:  NG tube placement. EXAM: ABDOMEN - 1 VIEW COMPARISON:  None. FINDINGS: Tip of the enteric tube below the diaphragm in the stomach, side-port in the region of the gastroesophageal junction. No bowel dilatation in the upper abdomen. IMPRESSION: Tip of the enteric tube below the diaphragm in the stomach. The side ports in the region of the gastroesophageal junction, recommend advancement of 3-5 cm for optimal placement. Electronically Signed   By: Keith Rake M.D.   On: 07/19/2019 01:50   DG Chest Portable  1 View  Result Date: 07/19/2019 CLINICAL DATA:  Intubation. EXAM: PORTABLE CHEST 1 VIEW COMPARISON:  Radiograph yesterday. FINDINGS: Endotracheal tube tip 4.1 cm from the carina. Enteric tube in place with tip below the diaphragm. Median sternotomy. Unchanged heart size and mediastinal contours. Left pleural effusion is similar. Probable developing right pleural effusion. Diffuse interstitial opacities with slight worsening in the right upper lobe. Additional patchy opacity at the right lung base. Vascular stent in the region of the left subclavian. No pneumothorax. IMPRESSION: 1. Endotracheal tube tip 4.1 cm from the carina. Enteric tube in place with tip below the diaphragm. 2. Patchy opacities in the right lower lobe, and developing in the right upper lobe, favoring infection. Background interstitial thickening  may be infectious or pulmonary edema. 3. Bilateral pleural effusions. Electronically Signed   By: Keith Rake M.D.   On: 07/19/2019 01:49   DG Chest Portable 1 View  Result Date: 07/10/2019 CLINICAL DATA:  Shortness of breath EXAM: PORTABLE CHEST 1 VIEW COMPARISON:  June 25, 2019 FINDINGS: Again noted are pulmonary opacities bilaterally with increasing consolidation at the right base. There are small bilateral pleural effusions, left greater than right. The patient is status post prior median sternotomy. The heart size remains enlarged. There is no pneumothorax. There are prominent interstitial lung markings bilaterally. Hazy airspace opacities are noted bilaterally IMPRESSION: 1. Bilateral airspace opacities, greatest at the right lower lobe, concerning for multifocal pneumonia. 2. Small bilateral pleural effusions, left greater than right. Electronically Signed   By: Constance Holster M.D.   On: 07/03/2019 22:15    ASSESSMENT / PLAN:  Acute on chronic hypoxic hypercapnic respiratory failure secondary to bacterial pneumonia vs. COVID-19 pneumonitis, AECOPD, and flash pulmonary edema Mechanical Intubation  Hx: Chronic Home O2 @2L   Full vent support for now-vent settings reviewed and established  SBT once all parameters met  Maintain O2 sats 88% to 92% VAP bundle implemented  Trend WBC and PCT; monitor fever curve  Follow cultures  Continue levaquin for now Continue iv solumedrol   Hypotension likely secondary to sedating medications  Elevated troponin secondary to demand ischemia in setting of acute respiratory failure vs. NSTEMI  Acute on chronic diastolic CHF  Hx: MI, CABG, HTN, and Chronic Atrial Fibrillation  Continuous telemetry monitoring  Trend troponin  Prn levophed gtt to maintain map >65 Hold outpatient antihypertensives  IV lasix as bp tolerates   Acute on chronic renal failure with hyperkalemia (baseline creatinine 1.6-1.9) Hyponatremia  Lactic acidosis  Trend  BMP  Replace electrolytes as indicated  Monitor UOP Avoid nephrotoxic medications  1 amp of sodium bicarb x1 dose   Elevated liver enzymes  Trend hepatic panel Will check acute hepatitis panel   Anemia of chronic kidney disease  Trend CBC  Monitor for s/sx of bleeding  Transfuse for hgb <7 and/or active signs of bleeding  Type II diabetes mellitus  CBG's q4hrs  SSI and scheduled lantus; if pt remains hyperglycemic will start insulin gtt  Hemoglobin A1c pending   Mechanical intubation pain/discomfort  Maintain RASS goal -1 to -2 Versed and fentanyl gtts to maintain RASS goal and for pain management  WUA daily   Best Practice: VTE px: eliquis  SUP px: iv famotidine  Diet: keep NPO for now    Marda Stalker, [place] Pager [phone] (please enter 7 digits) PCCM Consult Pager [phone] (please enter 7 digits)

## 2019-07-19 NOTE — ED Notes (Signed)
NP Hinton Dyer, made aware of pts low urine output.   Bladder scan results: 57mls

## 2019-07-19 NOTE — ED Provider Notes (Signed)
12:35 AM I was notified by the nursing staff that the patient was having severe respiratory distress I immediately presented to the room noted patient sitting upright tachypneic with positive accessory respiratory muscle use, speaking in 1 word phrases. Patient with diffuse rhonchi and bibasilar rales.  Per nursing staff patient had rapid deterioration over the last 5 minutes. Patient with pending respiratory failure with known COVID-19.  As such patient underwent emergent endotracheal intubation.  Procedure Name: Intubation Date/Time: 07/19/2019 12:57 AM Performed by: Gregor Hams, MD Pre-anesthesia Checklist: Patient identified, Emergency Drugs available, Suction available and Patient being monitored Preoxygenation: Pre-oxygenation with 100% oxygen Induction Type: IV induction and Rapid sequence Laryngoscope Size: Mac and 4 Grade View: Grade I Tube size: 7.0 mm Number of attempts: 1 Placement Confirmation: ETT inserted through vocal cords under direct vision,  CO2 detector and Breath sounds checked- equal and bilateral Secured at: 21 cm Tube secured with: ETT holder Dental Injury: Teeth and Oropharynx as per pre-operative assessment      Hospital staff notified    Gregor Hams, MD 07/19/19 [phone]

## 2019-07-20 ENCOUNTER — Inpatient Hospital Stay (HOSPITAL_COMMUNITY)
Admit: 2019-07-20 | Discharge: 2019-07-20 | Disposition: A | Discharge status: Home / Self Care | Payer: Medicare Other | Attending: Pulmonary Disease | Admitting: Pulmonary Disease

## 2019-07-20 DIAGNOSIS — I340 Nonrheumatic mitral (valve) insufficiency: Secondary | ICD-10-CM

## 2019-07-20 DIAGNOSIS — I361 Nonrheumatic tricuspid (valve) insufficiency: Secondary | ICD-10-CM

## 2019-07-20 LAB — COMPREHENSIVE METABOLIC PANEL
ALT: 116 U/L — ABNORMAL HIGH (ref 0–44)
AST: 107 U/L — ABNORMAL HIGH (ref 15–41)
Albumin: 2 g/dL — ABNORMAL LOW (ref 3.5–5.0)
Alkaline Phosphatase: 99 U/L (ref 38–126)
Anion gap: 13 (ref 5–15)
BUN: 55 mg/dL — ABNORMAL HIGH (ref 8–23)
CO2: 21 mmol/L — ABNORMAL LOW (ref 22–32)
Calcium: 8.4 mg/dL — ABNORMAL LOW (ref 8.9–10.3)
Chloride: 100 mmol/L (ref 98–111)
Creatinine, Ser: 2 mg/dL — ABNORMAL HIGH (ref 0.44–1.00)
GFR calc Af Amer: 28 mL/min — ABNORMAL LOW (ref >60)
GFR calc non Af Amer: 24 mL/min — ABNORMAL LOW (ref >60)
Glucose, Bld: 185 mg/dL — ABNORMAL HIGH (ref 70–99)
Potassium: 4.1 mmol/L (ref 3.5–5.1)
Sodium: 134 mmol/L — ABNORMAL LOW (ref 135–145)
Total Bilirubin: 0.7 mg/dL (ref 0.3–1.2)
Total Protein: 5.9 g/dL — ABNORMAL LOW (ref 6.5–8.1)

## 2019-07-20 LAB — BLOOD GAS, ARTERIAL
Acid-base deficit: 2.2 mmol/L — ABNORMAL HIGH (ref 0.0–2.0)
Bicarbonate: 23.7 mmol/L (ref 20.0–28.0)
FIO2: 0.36
MECHVT: 450 mL
O2 Saturation: 96.5 %
PEEP: 5 cmH2O
Patient temperature: 37
RATE: 16 resp/min
pCO2 arterial: 44 mmHg (ref 32.0–48.0)
pH, Arterial: 7.34 — ABNORMAL LOW (ref 7.350–7.450)
pO2, Arterial: 91 mmHg (ref 83.0–108.0)

## 2019-07-20 LAB — CBC
HCT: 23.5 % — ABNORMAL LOW (ref 36.0–46.0)
Hemoglobin: 7.3 g/dL — ABNORMAL LOW (ref 12.0–15.0)
MCH: 27.7 pg (ref 26.0–34.0)
MCHC: 31.1 g/dL (ref 30.0–36.0)
MCV: 89 fL (ref 80.0–100.0)
Platelets: 384 10*3/uL (ref 150–400)
RBC: 2.64 MIL/uL — ABNORMAL LOW (ref 3.87–5.11)
RDW: 16.8 % — ABNORMAL HIGH (ref 11.5–15.5)
WBC: 10.8 10*3/uL — ABNORMAL HIGH (ref 4.0–10.5)
nRBC: 0 % (ref 0.0–0.2)

## 2019-07-20 LAB — LEGIONELLA PNEUMOPHILA SEROGP 1 UR AG: L. pneumophila Serogp 1 Ur Ag: NEGATIVE

## 2019-07-20 LAB — ECHOCARDIOGRAM COMPLETE
Height: 62 in
Weight: 2240 oz

## 2019-07-20 LAB — GLUCOSE, CAPILLARY
Glucose-Capillary: 167 mg/dL — ABNORMAL HIGH (ref 70–99)
Glucose-Capillary: 173 mg/dL — ABNORMAL HIGH (ref 70–99)
Glucose-Capillary: 180 mg/dL — ABNORMAL HIGH (ref 70–99)
Glucose-Capillary: 230 mg/dL — ABNORMAL HIGH (ref 70–99)
Glucose-Capillary: 240 mg/dL — ABNORMAL HIGH (ref 70–99)
Glucose-Capillary: 256 mg/dL — ABNORMAL HIGH (ref 70–99)

## 2019-07-20 LAB — TROPONIN I (HIGH SENSITIVITY)
Troponin I (High Sensitivity): 1404 ng/L (ref <18)
Troponin I (High Sensitivity): 1502 ng/L (ref <18)

## 2019-07-20 LAB — PROCALCITONIN: Procalcitonin: 6.31 ng/mL

## 2019-07-20 LAB — STREP PNEUMONIAE URINARY ANTIGEN: Strep Pneumo Urinary Antigen: NEGATIVE

## 2019-07-20 MED ORDER — SODIUM CHLORIDE 0.9 % IV SOLN: 250.0000 mL | INTRAVENOUS | Status: DC

## 2019-07-20 MED ORDER — NOREPINEPHRINE 4 MG/250ML-% IV SOLN
2.0000 ug/min | INTRAVENOUS | Status: DC
  Administered 2019-07-20: 5 ug/min via INTRAVENOUS
  Filled 2019-07-20: qty 250

## 2019-07-20 MED ORDER — METHYLPREDNISOLONE SODIUM SUCC 40 MG IJ SOLR
40.0000 mg | INTRAMUSCULAR | Status: DC
  Administered 2019-07-20 – 2019-07-23 (×4): 40 mg via INTRAVENOUS
  Filled 2019-07-20 (×4): qty 1

## 2019-07-20 MED ORDER — MORPHINE SULFATE (PF) 2 MG/ML IV SOLN
1.0000 mg | INTRAVENOUS | Status: DC | PRN
  Administered 2019-07-20 – 2019-07-21 (×3): 2 mg via INTRAVENOUS
  Filled 2019-07-20 (×3): qty 1

## 2019-07-20 MED ORDER — INSULIN ASPART 100 UNIT/ML ~~LOC~~ SOLN
0.0000 [IU] | SUBCUTANEOUS | Status: DC
  Administered 2019-07-20: 20:00:00 11 [IU] via SUBCUTANEOUS
  Administered 2019-07-21 (×2): 4 [IU] via SUBCUTANEOUS
  Administered 2019-07-21: 7 [IU] via SUBCUTANEOUS
  Filled 2019-07-20 (×5): qty 1

## 2019-07-20 MED ORDER — PROPRANOLOL HCL 20 MG PO TABS: 40.0000 mg | ORAL_TABLET | Freq: Every day | ORAL | Status: DC

## 2019-07-20 MED ORDER — PHENOL 1.4 % MT LIQD
1.0000 | OROMUCOSAL | Status: DC | PRN
  Filled 2019-07-20: qty 177

## 2019-07-20 MED ORDER — ALBUMIN HUMAN 25 % IV SOLN
12.5000 g | Freq: Once | INTRAVENOUS | Status: AC
  Administered 2019-07-20: 10:00:00 12.5 g via INTRAVENOUS
  Filled 2019-07-20: qty 50

## 2019-07-20 NOTE — Consult Note (Signed)
CARDIOLOGY CONSULT NOTE               Patient ID: Andrea [name] MRN: [id number] DOB/AGE: Jun 18, 1946 74 y.o.  Admit date: 07/02/2019 Referring Physician Rufina Falco NP Primary Physician Modesta Messing Primary Cardiologist Chong Sicilian, [facility] Reason for Consultation respiratory failure shortness of breath congestive heart failure  HPI: Patient presents with respiratory failure acute on chronic renal failure hyperkalemia elevated troponin hypoxemia Covid pneumonitis patient has known coronary disease coronary bypass surgery February 2020 diabetes hypoxemia atrial fibrillation mild cardiomyopathy.  The patient has been on Eliquis for anticoagulation had respiratory failure on the vent and was found to have elevated troponins patient now being treated by critical care.  Patient now intubated sedated not responsive to questions no evidence of chest pain now here for follow-up evaluation cardiology consultation was recommended because of elevated troponins  Review of systems complete and found to be negative unless listed above     Past Medical History:  Diagnosis Date  . Arrhythmia    atrial fibrillation  . CHF (congestive heart failure) ([place])   . COPD (chronic obstructive pulmonary disease) ([place])   . Diabetes mellitus without complication ([place])   . Heart attack ([place])   . Hypertension   . Renal disorder     Past Surgical History:  Procedure Laterality Date  . ABDOMINAL HYSTERECTOMY    . CARDIAC SURGERY     stents    Medications Prior to Admission  Medication Sig Dispense Refill Last Dose  . albuterol (PROVENTIL HFA;VENTOLIN HFA) 108 (90 Base) MCG/ACT inhaler Inhale 2 puffs into the lungs every 6 (six) hours as needed for wheezing or shortness of breath. 1 Inhaler 0   . amLODipine (NORVASC) 5 MG tablet Take 5 mg by mouth daily.      Marland Kitchen apixaban (ELIQUIS) 5 MG TABS tablet Take 1 tablet (5 mg total) by mouth 2 (two) times daily. 180 tablet 1   . aspirin EC 81 MG tablet Take 81  mg by mouth daily.     Marland Kitchen atorvastatin (LIPITOR) 40 MG tablet Take 40 mg by mouth at bedtime.  3   . budesonide-formoterol (SYMBICORT) 160-4.5 MCG/ACT inhaler Inhale 2 puffs into the lungs 2 (two) times daily.      . Cholecalciferol (VITAMIN D3) 1000 units CAPS Take 1,000 Units by mouth daily.      Marland Kitchen ezetimibe (ZETIA) 10 MG tablet Take 10 mg by mouth daily.      . fluticasone (FLONASE) 50 MCG/ACT nasal spray Place 1 spray into both nostrils daily as needed for allergies.      . furosemide (LASIX) 20 MG tablet Take 40 mg by mouth daily.      Marland Kitchen glipiZIDE (GLUCOTROL) 10 MG tablet Take 10 mg by mouth 2 (two) times daily before a meal.      . JANUVIA 50 MG tablet Take 50 mg by mouth daily.      . metoprolol succinate (TOPROL-XL) 25 MG 24 hr tablet Take 1 tablet (25 mg total) by mouth daily. 30 tablet 0   . Multiple Vitamin (MULTI-VITAMINS) TABS Take 1 tablet by mouth daily.     . nitroGLYCERIN (NITROSTAT) 0.4 MG SL tablet Place 0.4 mg under the tongue every 5 (five) minutes as needed for chest pain.      Marland Kitchen omeprazole (PRILOSEC) 20 MG capsule Take 20 mg by mouth daily.      Marland Kitchen tiotropium (SPIRIVA HANDIHALER) 18 MCG inhalation capsule Place 18 mcg into inhaler and inhale daily.  Social History   Socioeconomic History  . Marital status: Divorced    Spouse name: Not on file  . Number of children: Not on file  . Years of education: Not on file  . Highest education level: Not on file  Occupational History  . Not on file  Tobacco Use  . Smoking status: Never Smoker  . Smokeless tobacco: Never Used  Substance and Sexual Activity  . Alcohol use: No  . Drug use: No  . Sexual activity: Not on file  Other Topics Concern  . Not on file  Social History Narrative  . Not on file   Social Determinants of Health   Financial Resource Strain:   . Difficulty of Paying Living Expenses: Not on file  Food Insecurity:   . Worried About Charity fundraiser in the Last Year: Not on file  . Ran Out of  Food in the Last Year: Not on file  Transportation Needs:   . Lack of Transportation (Medical): Not on file  . Lack of Transportation (Non-Medical): Not on file  Physical Activity:   . Days of Exercise per Week: Not on file  . Minutes of Exercise per Session: Not on file  Stress:   . Feeling of Stress : Not on file  Social Connections:   . Frequency of Communication with Friends and Family: Not on file  . Frequency of Social Gatherings with Friends and Family: Not on file  . Attends Religious Services: Not on file  . Active Member of Clubs or Organizations: Not on file  . Attends Archivist Meetings: Not on file  . Marital Status: Not on file  Intimate Partner Violence:   . Fear of Current or Ex-Partner: Not on file  . Emotionally Abused: Not on file  . Physically Abused: Not on file  . Sexually Abused: Not on file    History reviewed. No pertinent family history.    Review of systems complete and found to be negative unless listed above      PHYSICAL EXAM  General: Well developed, well nourished, in no acute distress intubated sedated HEENT:  Normocephalic and atramatic Neck:  No JVD.  Lungs: Clear bilaterally to auscultation and percussion. Heart: HRRR . Normal S1 and S2 without gallops or murmurs.  Abdomen: Bowel sounds are positive, abdomen soft and non-tender  Msk:  Back normal, normal gait. Normal strength and tone for age. Extremities: No clubbing, cyanosis or edema.   Neuro: Alert and oriented X 3. Psych:  Good affect, responds appropriately  Labs:   Lab Results  Component Value Date   WBC 10.8 (H) 07/20/2019   HGB 7.3 (L) 07/20/2019   HCT 23.5 (L) 07/20/2019   MCV 89.0 07/20/2019   PLT 384 07/20/2019    Recent Labs  Lab 07/20/19 0500  NA 134*  K 4.1  CL 100  CO2 21*  BUN 55*  CREATININE 2.00*  CALCIUM 8.4*  PROT 5.9*  BILITOT 0.7  ALKPHOS 99  ALT 116*  AST 107*  GLUCOSE 185*   Lab Results  Component Value Date   TROPONINI  <0.03 11/15/2017   No results found for: CHOL No results found for: HDL No results found for: [facility] Lab Results  Component Value Date   TRIG 110 05/15/2019   No results found for: CHOLHDL No results found for: LDLDIRECT    Radiology: DG Abdomen 1 View  Result Date: 07/19/2019 CLINICAL DATA:  NG tube placement. EXAM: ABDOMEN - 1 VIEW COMPARISON:  None.  FINDINGS: Tip of the enteric tube below the diaphragm in the stomach, side-port in the region of the gastroesophageal junction. No bowel dilatation in the upper abdomen. IMPRESSION: Tip of the enteric tube below the diaphragm in the stomach. The side ports in the region of the gastroesophageal junction, recommend advancement of 3-5 cm for optimal placement. Electronically Signed   By: Keith Rake M.D.   On: 07/19/2019 01:50   CT HEAD WO CONTRAST  Result Date: 07/19/2019 CLINICAL DATA:  Tachypnea, neurological deficit EXAM: CT HEAD WITHOUT CONTRAST TECHNIQUE: Contiguous axial images were obtained from the base of the skull through the vertex without intravenous contrast. COMPARISON:  06/28/2016 FINDINGS: Brain: No evidence of acute infarction, hemorrhage, extra-axial collection, ventriculomegaly, or mass effect. Generalized cerebral atrophy. Periventricular white matter low attenuation likely secondary to microangiopathy. Vascular: Cerebrovascular atherosclerotic calcifications are noted. Skull: Negative for fracture or focal lesion. Sinuses/Orbits: Visualized portions of the orbits are unremarkable. Visualized portions of the paranasal sinuses are unremarkable. Visualized portions of the mastoid air cells are unremarkable. Other: None. IMPRESSION: No acute intracranial pathology. Electronically Signed   By: Kathreen Devoid   On: 07/19/2019 18:23   NM Pulmonary Perfusion  Result Date: 07/19/2019 CLINICAL DATA:  S respiratory failure, intubation, sepsis, respiratory distress with hypoxia, severe shortness of breath and cough, fatigue, history of  CHF, COPD, diabetes mellitus, stage III chronic kidney disease, was COVID positive in November 2020 EXAM: NUCLEAR MEDICINE PERFUSION LUNG SCAN TECHNIQUE: Perfusion images were obtained in multiple projections after intravenous injection of radiopharmaceutical. Ventilation scan not performed, patient on ventilator. RADIOPHARMACEUTICALS:  4.16 mCi Tc-69m MAA IV COMPARISON:  None Correlation: Chest radiograph 07/19/2019 FINDINGS: Diminished perfusion at anterior segment of RIGHT middle lobe adjacent to minor fissure corresponding to more focal infiltrate on chest radiography. Generally diminished perfusion in LEFT lower lobe likely reflecting slight prominence of the cardiac silhouette, small pleural effusion, and infiltrates. Contour abnormalities of the posterior lower lobes from small pleural effusions. No other segmental or subsegmental perfusion defects identified. Pattern favors parenchymal lung disease rather than pulmonary embolism. No ventilation exam for correlation. IMPRESSION: Subsegmental perfusion defect in RIGHT upper lobe adjacent to minor fissure corresponding to focal infiltrate on chest radiograph. No other segmental or subsegmental perfusion defects are identified. In the absence of a ventilation exam, findings favor parenchymal lung disease and likely represent a low likelihood for pulmonary embolism. Electronically Signed   By: Lavonia Dana M.D.   On: 07/19/2019 18:21   DG Chest Portable 1 View  Result Date: 07/19/2019 CLINICAL DATA:  Intubation. EXAM: PORTABLE CHEST 1 VIEW COMPARISON:  Radiograph yesterday. FINDINGS: Endotracheal tube tip 4.1 cm from the carina. Enteric tube in place with tip below the diaphragm. Median sternotomy. Unchanged heart size and mediastinal contours. Left pleural effusion is similar. Probable developing right pleural effusion. Diffuse interstitial opacities with slight worsening in the right upper lobe. Additional patchy opacity at the right lung base. Vascular  stent in the region of the left subclavian. No pneumothorax. IMPRESSION: 1. Endotracheal tube tip 4.1 cm from the carina. Enteric tube in place with tip below the diaphragm. 2. Patchy opacities in the right lower lobe, and developing in the right upper lobe, favoring infection. Background interstitial thickening may be infectious or pulmonary edema. 3. Bilateral pleural effusions. Electronically Signed   By: Keith Rake M.D.   On: 07/19/2019 01:49   DG Chest Portable 1 View  Result Date: 07/10/2019 CLINICAL DATA:  Shortness of breath EXAM: PORTABLE CHEST 1 VIEW COMPARISON:  June 25, 2019 FINDINGS: Again noted are pulmonary opacities bilaterally with increasing consolidation at the right base. There are small bilateral pleural effusions, left greater than right. The patient is status post prior median sternotomy. The heart size remains enlarged. There is no pneumothorax. There are prominent interstitial lung markings bilaterally. Hazy airspace opacities are noted bilaterally IMPRESSION: 1. Bilateral airspace opacities, greatest at the right lower lobe, concerning for multifocal pneumonia. 2. Small bilateral pleural effusions, left greater than right. Electronically Signed   By: Constance Holster M.D.   On: 07/07/2019 22:15   DG Chest Port 1 View  Result Date: 06/25/2019 CLINICAL DATA:  Pneumonia. EXAM: PORTABLE CHEST 1 VIEW COMPARISON:  June 22, 2019. FINDINGS: Stable cardiomediastinal silhouette. No pneumothorax is noted. Stable bilateral lung opacities are noted, left greater than right, concerning for multifocal pneumonia and probable associated small pleural effusions. Bony thorax is unremarkable. IMPRESSION: Stable bilateral lung opacities are noted, left greater than right, concerning for multifocal pneumonia and associated small pleural effusions. Electronically Signed   By: Marijo Conception M.D.   On: 06/25/2019 07:59   DG Chest Portable 1 View  Result Date: 06/22/2019 CLINICAL  DATA:  Shortness of breath EXAM: PORTABLE CHEST 1 VIEW COMPARISON:  May 31, 2019 FINDINGS: The cardiomediastinal silhouette is obscured. Overlying median sternotomy wires. Large area of patchy consolidation seen throughout the left mid and lower lung. There is increased interstitial markings seen in the left upper lung. Small amount of hazy airspace opacity at the right lung base. There is a blunting of the bilateral costophrenic angles, likely small bilateral pleural effusions. No acute osseous abnormality. IMPRESSION: Multifocal patchy airspace consolidation seen predominantly throughout the left lung and right lung base. This is likely due to multifocal pneumonia. Small bilateral pleural effusions. Electronically Signed   By: Prudencio Pair M.D.   On: 06/22/2019 23:47   ECHOCARDIOGRAM COMPLETE  Result Date: 06/23/2019   ECHOCARDIOGRAM REPORT   Patient Name:   Andrea [name] Date of Exam: 06/23/2019 Medical Rec #:  [id number]        Height:       62.0 in Accession #:    [id number]       Weight:       151.0 lb Date of Birth:  07/15/1945        BSA:          1.70 m Patient Age:    34 years         BP:           138/59 mmHg Patient Gender: F                HR:           90 bpm. Exam Location:  [facility] Procedure: 2D Echo, Cardiac Doppler and Color Doppler Indications:     Abnormal ECG 794.31  History:         Patient has no prior history of Echocardiogram examinations.                  Prior CABG, COPD; Risk Factors:Diabetes. Heart attack.  Sonographer:     Sherrie Sport RDCS (AE) Referring Phys:  [id number] Athena Masse Diagnosing Phys: Nelva Bush MD  Sonographer Comments: CABG surgical scar. IMPRESSIONS  1. Left ventricular ejection fraction, by visual estimation, is 40 to 45%. The left ventricle has moderately decreased function. There is mildly increased left ventricular hypertrophy.  2. Moderate hypokinesis of the left ventricular, entire anteroseptal wall and anterior wall.  3. Elevated left atrial  pressure.  4. Left ventricular diastolic parameters are consistent with Grade II diastolic dysfunction (pseudonormalization).  5. The left ventricle demonstrates regional wall motion abnormalities.  6. Global right ventricle has normal systolic function.The right ventricular size is normal. No increase in right ventricular wall thickness.  7. Left atrial size was normal.  8. Right atrial size was normal.  9. Mild mitral annular calcification. 10. The mitral valve is degenerative. Trivial mitral valve regurgitation. No evidence of mitral stenosis. 11. The tricuspid valve is not well visualized. 12. The aortic valve was not well visualized. Aortic valve regurgitation is not visualized. Mild aortic valve sclerosis without stenosis. 13. The pulmonic valve was not well visualized. Pulmonic valve regurgitation is not visualized. 14. Moderately elevated pulmonary artery systolic pressure. 15. The inferior vena cava is normal in size with greater than 50% respiratory variability, suggesting right atrial pressure of 3 mmHg. FINDINGS  Left Ventricle: Left ventricular ejection fraction, by visual estimation, is 40 to 45%. The left ventricle has moderately decreased function. Moderate hypokinesis of the left ventricular, entire anteroseptal wall and anterior wall. The left ventricle demonstrates regional wall motion abnormalities. The left ventricular internal cavity size was the left ventricle is normal in size. There is mildly increased left ventricular hypertrophy. Left ventricular diastolic parameters are consistent with Grade II diastolic dysfunction (pseudonormalization). Elevated left atrial pressure. Right Ventricle: The right ventricular size is normal. No increase in right ventricular wall thickness. Global RV systolic function is has normal systolic function. The tricuspid regurgitant velocity is 3.12 m/s, and with an assumed right atrial pressure  of 3 mmHg, the estimated right ventricular systolic pressure is  moderately elevated at 41.9 mmHg. Left Atrium: Left atrial size was normal in size. Right Atrium: Right atrial size was normal in size Pericardium: There is no evidence of pericardial effusion. Mitral Valve: The mitral valve is degenerative in appearance. There is mild thickening of the mitral valve leaflet(s). Mild mitral annular calcification. Trivial mitral valve regurgitation. No evidence of mitral valve stenosis by observation. Tricuspid Valve: The tricuspid valve is not well visualized. Tricuspid valve regurgitation is mild. Aortic Valve: The aortic valve was not well visualized. Aortic valve regurgitation is not visualized. Mild aortic valve sclerosis is present, with no evidence of aortic valve stenosis. Aortic valve mean gradient measures 4.0 mmHg. Aortic valve peak gradient measures 6.1 mmHg. Aortic valve area, by VTI measures 1.51 cm. Pulmonic Valve: The pulmonic valve was not well visualized. Pulmonic valve regurgitation is not visualized. Pulmonic regurgitation is not visualized. No evidence of pulmonic stenosis. Aorta: The aortic root is normal in size and structure. Pulmonary Artery: The pulmonary artery is not well seen. Venous: The inferior vena cava is normal in size with greater than 50% respiratory variability, suggesting right atrial pressure of 3 mmHg. IAS/Shunts: No atrial level shunt detected by color flow Doppler.  LEFT VENTRICLE PLAX 2D LVIDd:         4.87 cm       Diastology LVIDs:         3.80 cm       LV e' lateral:   8.38 cm/s LV PW:         1.10 cm       LV E/e' lateral: 10.8 LV IVS:        0.66 cm       LV e' medial:    2.83 cm/s LVOT diam:     2.00 cm       LV  E/e' medial:  31.9 LV SV:         49 ml LV SV Index:   28.14 LVOT Area:     3.14 cm  LV Volumes (MOD) LV area d, A2C:    23.80 cm LV area d, A4C:    22.80 cm LV area s, A2C:    18.00 cm LV area s, A4C:    16.10 cm LV major d, A2C:   6.22 cm LV major d, A4C:   6.17 cm LV major s, A2C:   5.94 cm LV major s, A4C:   5.34 cm  LV vol d, MOD A2C: 75.8 ml LV vol d, MOD A4C: 69.0 ml LV vol s, MOD A2C: 47.7 ml LV vol s, MOD A4C: 41.2 ml LV SV MOD A2C:     28.1 ml LV SV MOD A4C:     69.0 ml LV SV MOD BP:      25.8 ml RIGHT VENTRICLE RV Basal diam:  2.55 cm RV S prime:     10.10 cm/s TAPSE (M-mode): 3.7 cm LEFT ATRIUM             Index       RIGHT ATRIUM           Index LA diam:        3.90 cm 2.30 cm/m  RA Area:     15.40 cm LA Vol (A2C):   40.3 ml 23.76 ml/m RA Volume:   38.40 ml  22.64 ml/m LA Vol (A4C):   36.6 ml 21.57 ml/m LA Biplane Vol: 41.6 ml 24.52 ml/m  AORTIC VALVE                   PULMONIC VALVE AV Area (Vmax):    1.41 cm    PV Vmax:        0.75 m/s AV Area (Vmean):   1.27 cm    PV Peak grad:   2.2 mmHg AV Area (VTI):     1.51 cm    RVOT Peak grad: 3 mmHg AV Vmax:           123.67 cm/s AV Vmean:          95.567 cm/s AV VTI:            0.298 m AV Peak Grad:      6.1 mmHg AV Mean Grad:      4.0 mmHg LVOT Vmax:         55.40 cm/s LVOT Vmean:        38.500 cm/s LVOT VTI:          0.143 m LVOT/AV VTI ratio: 0.48  AORTA Ao Root diam: 2.10 cm MITRAL VALVE                        TRICUSPID VALVE MV Area (PHT): 4.68 cm             TR Peak grad:   38.9 mmHg MV PHT:        46.98 msec           TR Vmax:        312.00 cm/s MV Decel Time: 162 msec MV E velocity: 90.20 cm/s 103 cm/s  SHUNTS MV A velocity: 60.10 cm/s 70.3 cm/s Systemic VTI:  0.14 m MV E/A ratio:  1.50       1.5       Systemic Diam: 2.00 cm  Nelva Bush MD Electronically signed by Harrell Gave  End MD Signature Date/Time: 06/23/2019/9:56:58 AM    Final     EKG: Sinus tachycardia nonspecific ST-T wave changes  ASSESSMENT AND PLAN:  Elevated troponin possibly demand ischemia Possible Covid myocarditis Acute on chronic hypoxic respiratory failure Hyperkalemia Bacterial pneumonia versus Covid pneumonitis Known coronary artery disease Hypertension COPD Chronic renal sufficiency Hypoxemia . Agree with admit to ICU Continue respiratory support supplemental  oxygen Agree with critical care management Follow-up EKGs troponins Continue broad-spectrum antibiotic therapy Agree with steroid therapy for possible Covid pneumonitis Continue ventilatory support Agree with echocardiogram for assessment of mild known left ventricular dysfunction Continue gentle diuresis Agree with diabetes management and control This may represent demand ischemia so no direct invasive procedures recommended at this point  Signed: Yolonda Kida MD 07/20/2019, 11:25 AM

## 2019-07-20 NOTE — Progress Notes (Signed)
*  PRELIMINARY RESULTS* Echocardiogram 2D Echocardiogram has been performed.  Jostin Rue C Joselito Fieldhouse 07/20/2019, 12:00 PM

## 2019-07-20 NOTE — Progress Notes (Signed)
Extubated. RTT and MD at the bedside. Pt placed on Zena 5L SPO2 in the 90's.

## 2019-07-20 NOTE — Progress Notes (Signed)
Name: Andrea [name] MRN: [id number] DOB: Oct 28, 1945    ADMISSION DATE:  07/09/2019 CONSULTATION DATE: 07/19/2019  REFERRING MD : Rufina Falco, NP  CHIEF COMPLAINT: Shortness of Breath   BRIEF PATIENT DESCRIPTION:  74 yo female dx with COVID-19 04/2019 admitted with acute on chronic renal failure with hyperkalemia, elevated troponin, and acute on chronic hypoxic hypercapnic respiratory failure secondary to bacterial pneumonia vs. COVID-19 pneumonitis requiring mechanical intubation   SIGNIFICANT EVENTS/STUDIES:  01/22: Pt admitted to the stepdown unit with acute hypoxic respiratory failure secondary to multifocal pneumonia, however remained in the ER pending bed availability 01/23: Pt developed worsening acute hypoxic respiratory failure requiring emergent mechanical intubation in the ER.  PCCM contacted to assume care 1/24 - parameters for SBT met, patient s/p weaning trial with successful liberation from MV.  Updated daughter [name].    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 She presented to [facility] ER via EMS on 01/22 with worsening shortness of breath, cough, and fatigue.  At baseline pt wears chronic home O2 @2L , however due to symptoms she has had to increase her O2 to 4L 24hrs prior to current ER presentation.  En route to the ER EMS placed pt on 6L O2 and administered albuterol.  Upon arrival to the ER pt tachypneic with increased work of breathing requiring iv steroids, duonebs, and HFNC.  Lab results revealed Na+ 133, CO2 21, BUN 38, creatinine 1.78, glucose 300, anion gap 17, troponin 90, lactic acid 6.2, pct 1.11, wbc 13.7, hgb 8.4, and vbg pH 7.38/pCO2 42.  CXR concerning for multifocal pneumonia and small  bilateral pleural effusions.  She received solumedrol, vancomycin, and cefepime.  She was subsequently admitted to the stepdown unit per hospitalist team for additional workup and treatment, however remained in the ER pending bed availability.  On 01/23 pt developed worsening acute hypoxic respiratory failure possibly due to flash pulmonary edema after receiving a total of 3L of fluids requiring mechanical intubation.  PCCM contacted to assume care.  Pt does have a hx of COVID-19 diagnosed 05/14/2020, which required hospitalization/[facility] at [facility]. Pt discharged home on 05/20/2019.  However, she required hospitalization again at [facility] on 05/31/2019 due to multifocal pneumonia, following treatment she was discharged home on 06/29/2019.   PAST MEDICAL HISTORY :   has a past medical history of Arrhythmia, CHF (congestive heart failure) ([place]), COPD (chronic obstructive pulmonary disease) ([place]), Diabetes mellitus without complication ([place]), Heart attack ([place]), Hypertension, and Renal disorder.  has a past surgical history that includes Cardiac surgery and Abdominal hysterectomy. Prior to Admission medications   Medication Sig Start Date End Date Taking? Authorizing Provider  albuterol (PROVENTIL HFA;VENTOLIN HFA) 108 (90 Base) MCG/ACT inhaler Inhale 2 puffs into the lungs every 6 (six) hours as needed for wheezing or shortness of breath. 11/18/17   Leslye Peer, Richard, MD  amLODipine (NORVASC) 5 MG tablet Take 5 mg by mouth daily.  09/12/18   [provider]  apixaban (ELIQUIS) 5 MG TABS tablet Take 1 tablet (5 mg total) by mouth 2 (two) times daily. 06/10/19   Mercy Riding, MD  aspirin EC 81 MG tablet Take 81 mg by mouth daily.    [provider]  atorvastatin (LIPITOR) 40 MG  tablet Take 40 mg by mouth at bedtime. 10/09/17   [provider]  budesonide-formoterol (SYMBICORT) 160-4.5 MCG/ACT inhaler Inhale 2 puffs into the lungs 2 (two) times daily.  06/28/16   [provider]  Cholecalciferol (VITAMIN D3) 1000 units CAPS Take 1,000 Units by mouth daily.     [provider]  ezetimibe (ZETIA) 10 MG tablet Take 10 mg by mouth daily.  04/28/19 04/27/20  [provider]  fluticasone (FLONASE) 50 MCG/ACT nasal spray Place 1 spray into both nostrils daily as needed for allergies.     [provider]  furosemide (LASIX) 20 MG tablet Take 40 mg by mouth daily.  12/04/18   [provider]  glipiZIDE (GLUCOTROL) 10 MG tablet Take 10 mg by mouth 2 (two) times daily before a meal.  03/19/17   [provider]  JANUVIA 50 MG tablet Take 50 mg by mouth daily.  11/20/18   [provider]  metoprolol succinate (TOPROL-XL) 25 MG 24 hr tablet Take 1 tablet (25 mg total) by mouth daily. 06/30/19 08/24/2019  Wyvonnia Dusky, MD  Multiple Vitamin (MULTI-VITAMINS) TABS Take 1 tablet by mouth daily.    [provider]  nitroGLYCERIN (NITROSTAT) 0.4 MG SL tablet Place 0.4 mg under the tongue every 5 (five) minutes as needed for chest pain.  01/19/17   [provider]  omeprazole (PRILOSEC) 20 MG capsule Take 20 mg by mouth daily.     [provider]  tiotropium (SPIRIVA HANDIHALER) 18 MCG inhalation capsule Place 18 mcg into inhaler and inhale daily.  08/01/17   [provider]   Allergies  Allergen Reactions  . Metformin And Related Diarrhea  . Sulfur Hives    FAMILY HISTORY:  family history is not on file. SOCIAL HISTORY:  reports that she has never smoked. She has never used smokeless tobacco. She reports that she does not drink alcohol or use drugs.  REVIEW OF SYSTEMS:   Unable to assess pt intubated   SUBJECTIVE:  Unable to assess pt intubated   VITAL SIGNS: Temp:  [97.3 F (36.3 C)-98.3 F (36.8 C)] 98.3 F (36.8 C) (01/24 0800) Pulse Rate:  [64-102] 102 (01/24 0900) Resp:  [9-24] 10 (01/24 0900) BP: (103-183)/(40-158) 183/158 (01/24 0900) SpO2:  [95 %-100 %] 95 % (01/24  0900) FiO2 (%):  [36 %-40 %] 36 % (01/24 0800)  PHYSICAL EXAMINATION: General: acutely ill appearing female, NAD  Neuro: alert, follows commands, PERRL   HEENT: supple, mild JVD +NG tube Cardiovascular: sinus rhythm, rrr, no R/G Lungs: diminished throughout, even, non labored  Abdomen: +BS x4, soft, non distended  Musculoskeletal: normal bulk and tone, no edema  Skin: intact no rashes or lesions present   Recent Labs  Lab 06/27/2019 2139 07/19/19 0305  NA 133* 132*  K 4.6 5.4*  CL 95* 97*  CO2 21* 23  BUN 38* 43*  CREATININE 1.78* 1.82*  GLUCOSE 300* 426*   Recent Labs  Lab 07/14/2019 2139 07/20/19 0500  HGB 8.4* 7.3*  HCT 27.7* 23.5*  WBC 13.7* 10.8*  PLT 447* 384   DG Abdomen 1 View  Result Date: 07/19/2019 CLINICAL DATA:  NG tube placement. EXAM: ABDOMEN - 1 VIEW COMPARISON:  None. FINDINGS: Tip of the enteric tube below the diaphragm in the stomach, side-port in the region of the gastroesophageal junction. No bowel dilatation in the upper abdomen. IMPRESSION: Tip of the enteric tube below the diaphragm in the stomach. The side ports in the region of  the gastroesophageal junction, recommend advancement of 3-5 cm for optimal placement. Electronically Signed   By: Keith Rake M.D.   On: 07/19/2019 01:50   CT HEAD WO CONTRAST  Result Date: 07/19/2019 CLINICAL DATA:  Tachypnea, neurological deficit EXAM: CT HEAD WITHOUT CONTRAST TECHNIQUE: Contiguous axial images were obtained from the base of the skull through the vertex without intravenous contrast. COMPARISON:  06/28/2016 FINDINGS: Brain: No evidence of acute infarction, hemorrhage, extra-axial collection, ventriculomegaly, or mass effect. Generalized cerebral atrophy. Periventricular white matter low attenuation likely secondary to microangiopathy. Vascular: Cerebrovascular atherosclerotic calcifications are noted. Skull: Negative for fracture or focal lesion. Sinuses/Orbits: Visualized portions of the orbits are  unremarkable. Visualized portions of the paranasal sinuses are unremarkable. Visualized portions of the mastoid air cells are unremarkable. Other: None. IMPRESSION: No acute intracranial pathology. Electronically Signed   By: Kathreen Devoid   On: 07/19/2019 18:23   NM Pulmonary Perfusion  Result Date: 07/19/2019 CLINICAL DATA:  S respiratory failure, intubation, sepsis, respiratory distress with hypoxia, severe shortness of breath and cough, fatigue, history of CHF, COPD, diabetes mellitus, stage III chronic kidney disease, was COVID positive in November 2020 EXAM: NUCLEAR MEDICINE PERFUSION LUNG SCAN TECHNIQUE: Perfusion images were obtained in multiple projections after intravenous injection of radiopharmaceutical. Ventilation scan not performed, patient on ventilator. RADIOPHARMACEUTICALS:  4.16 mCi Tc-48mMAA IV COMPARISON:  None Correlation: Chest radiograph 07/19/2019 FINDINGS: Diminished perfusion at anterior segment of RIGHT middle lobe adjacent to minor fissure corresponding to more focal infiltrate on chest radiography. Generally diminished perfusion in LEFT lower lobe likely reflecting slight prominence of the cardiac silhouette, small pleural effusion, and infiltrates. Contour abnormalities of the posterior lower lobes from small pleural effusions. No other segmental or subsegmental perfusion defects identified. Pattern favors parenchymal lung disease rather than pulmonary embolism. No ventilation exam for correlation. IMPRESSION: Subsegmental perfusion defect in RIGHT upper lobe adjacent to minor fissure corresponding to focal infiltrate on chest radiograph. No other segmental or subsegmental perfusion defects are identified. In the absence of a ventilation exam, findings favor parenchymal lung disease and likely represent a low likelihood for pulmonary embolism. Electronically Signed   By: MLavonia DanaM.D.   On: 07/19/2019 18:21   DG Chest Portable 1 View  Result Date: 07/19/2019 CLINICAL DATA:   Intubation. EXAM: PORTABLE CHEST 1 VIEW COMPARISON:  Radiograph yesterday. FINDINGS: Endotracheal tube tip 4.1 cm from the carina. Enteric tube in place with tip below the diaphragm. Median sternotomy. Unchanged heart size and mediastinal contours. Left pleural effusion is similar. Probable developing right pleural effusion. Diffuse interstitial opacities with slight worsening in the right upper lobe. Additional patchy opacity at the right lung base. Vascular stent in the region of the left subclavian. No pneumothorax. IMPRESSION: 1. Endotracheal tube tip 4.1 cm from the carina. Enteric tube in place with tip below the diaphragm. 2. Patchy opacities in the right lower lobe, and developing in the right upper lobe, favoring infection. Background interstitial thickening may be infectious or pulmonary edema. 3. Bilateral pleural effusions. Electronically Signed   By: MKeith RakeM.D.   On: 07/19/2019 01:49   DG Chest Portable 1 View  Result Date: 07/16/2019 CLINICAL DATA:  Shortness of breath EXAM: PORTABLE CHEST 1 VIEW COMPARISON:  June 25, 2019 FINDINGS: Again noted are pulmonary opacities bilaterally with increasing consolidation at the right base. There are small bilateral pleural effusions, left greater than right. The patient is status post prior median sternotomy. The heart size remains enlarged. There is no pneumothorax. There are prominent  interstitial lung markings bilaterally. Hazy airspace opacities are noted bilaterally IMPRESSION: 1. Bilateral airspace opacities, greatest at the right lower lobe, concerning for multifocal pneumonia. 2. Small bilateral pleural effusions, left greater than right. Electronically Signed   By: Constance Holster M.D.   On: 07/22/2019 22:15    ASSESSMENT / PLAN:  Acute on chronic hypoxic hypercapnic respiratory failure - secondary to fluid overload with concurrent COVID-19 pneumonitis and AECOPD -daughter reports patient has been drinking high volume H2O at  home with noted progressively worsening peripheral edema and SOB -she does have advanced COPD and recently stopped steroid burst with recurrence of COPD symptoms - patient followed by Dr Vella Kohler [facility] pulm.  -will continue COPD care path -DuoNeb  -Solumedrol - 40 daily -IS and MetaNeb for chest physiotherapy -continue Levofloxacin -septic workup negative thus far -RVP is negative -MRSA PCR is negative -procalcitonin is elevated >6 -WBC count is trending down - pt is on glucocorticoids -s/p VQ - low Prob    Acute on chronic renal failure- KDIGO stage 3 - d/c nonessential nephrotoxic medications -gentle rehydration- 1.2L + 07/20/19 -now off of vasopressors -s/p ABG - acidemia resolved     Acute on chronic systolic CHF with EX<52%    - repeat TTE in process    - BNP >1700    - will initiate diuresis post weaning from vasopresors    Severe Anemia of Chronic disease    - with dilutional effect post IVF     - monitoring h/h q24    -albumin 1amp 25% daily while in MICU    - no signs of bleeding on exam    - will need outpatient nephrology evaluation with poss Epo infusion   Paroxysmal atrial fibrillation   - on eliquis   Demand ischemia  -mild elevation of troponin - trend repeating - no chest pain per patient - repeat EKG in process -consider cardiology consult if worse   Type II diabetes mellitus  -monitor blood sugars closely - profound hyperglycemia - now controlled CBG's q4hrs  SSI and scheduled lantus; if pt remains hyperglycemic will start insulin gtt  Hemoglobin A1c pending     Best Practice: VTE px: eliquis  SUP px: iv famotidine  Diet: keep NPO for now     Critical care provider statement:    Critical care time (minutes):  33   Critical care time was exclusive of:  Separately billable procedures and  treating other patients   Critical care was necessary to treat or prevent imminent or  life-threatening deterioration of the following conditions:   Acute on chronic hypoxemic hypercapnic respiratory failure, acute on crhonic renal failure, acute on chronic heart failure, multiple comorbid conditions   Critical care was time spent personally by me on the following  activities:  Development of treatment plan with patient or surrogate,  discussions with consultants, evaluation of patient's response to  treatment, examination of patient, obtaining history from patient or  surrogate, ordering and performing treatments and interventions, ordering  and review of laboratory studies and re-evaluation of patient's condition   I assumed direction of critical care for this patient from another  provider in my specialty: no      Ottie Glazier, M.D.  Pulmonary & [place]

## 2019-07-20 NOTE — Progress Notes (Signed)
[facility] Cardiology    SUBJECTIVE: Patient doing much better now extubated sitting up in bed denies any fever chills or sweats still has some shortness of breath denies any palpitations or tachycardia no chest pain.   Vitals:   07/20/19 1500 07/20/19 1600 07/20/19 1700 07/20/19 1800  BP: (!) 102/48 (!) 105/59 (!) 108/57 (!) 109/55  Pulse: 98 100 (!) 104 98  Resp: 15 14 20 19   Temp:  98.6 F (37 C)    TempSrc:  Axillary    SpO2: 92% 97% 94% 93%  Weight:      Height:         Intake/Output Summary (Last 24 hours) at 07/20/2019 1927 Last data filed at 07/20/2019 1800 Gross per 24 hour  Intake 741.14 ml  Output 1115 ml  Net -373.86 ml      PHYSICAL EXAM  General: Well developed, well nourished, in no acute distress HEENT:  Normocephalic and atramatic Neck:  No JVD.  Lungs: Clear bilaterally to auscultation and percussion. Heart: HRRR . Normal S1 and S2 without gallops or murmurs.  Abdomen: Bowel sounds are positive, abdomen soft and non-tender  Msk:  Back normal, normal gait. Normal strength and tone for age. Extremities: No clubbing, cyanosis or edema.   Neuro: Alert and oriented X 3. Psych:  Good affect, responds appropriately   LABS: Basic Metabolic Panel: Recent Labs    07/19/19 0305 07/20/19 0500  NA 132* 134*  K 5.4* 4.1  CL 97* 100  CO2 23 21*  GLUCOSE 426* 185*  BUN 43* 55*  CREATININE 1.82* 2.00*  CALCIUM 8.1* 8.4*   Liver Function Tests: Recent Labs    07/19/19 0305 07/20/19 0500  AST 127* 107*  ALT 82* 116*  ALKPHOS 122 99  BILITOT 1.1 0.7  PROT 6.2* 5.9*  ALBUMIN 1.9* 2.0*   No results for input(s): LIPASE, AMYLASE in the last 72 hours. CBC: Recent Labs    07/11/2019 2139 07/20/19 0500  WBC 13.7* 10.8*  NEUTROABS 8.2*  --   HGB 8.4* 7.3*  HCT 27.7* 23.5*  MCV 92.0 89.0  PLT 447* 384   Cardiac Enzymes: No results for input(s): CKTOTAL, CKMB, CKMBINDEX, TROPONINI in the last 72 hours. BNP: Invalid input(s): POCBNP D-Dimer: No  results for input(s): DDIMER in the last 72 hours. Hemoglobin A1C: Recent Labs    07/10/2019 2139  HGBA1C 7.9*   Fasting Lipid Panel: No results for input(s): CHOL, HDL, LDLCALC, TRIG, CHOLHDL, LDLDIRECT in the last 72 hours. Thyroid Function Tests: No results for input(s): TSH, T4TOTAL, T3FREE, THYROIDAB in the last 72 hours.  Invalid input(s): FREET3 Anemia Panel: No results for input(s): VITAMINB12, FOLATE, FERRITIN, TIBC, IRON, RETICCTPCT in the last 72 hours.  DG Abdomen 1 View  Result Date: 07/19/2019 CLINICAL DATA:  NG tube placement. EXAM: ABDOMEN - 1 VIEW COMPARISON:  None. FINDINGS: Tip of the enteric tube below the diaphragm in the stomach, side-port in the region of the gastroesophageal junction. No bowel dilatation in the upper abdomen. IMPRESSION: Tip of the enteric tube below the diaphragm in the stomach. The side ports in the region of the gastroesophageal junction, recommend advancement of 3-5 cm for optimal placement. Electronically Signed   By: Keith Rake M.D.   On: 07/19/2019 01:50   CT HEAD WO CONTRAST  Result Date: 07/19/2019 CLINICAL DATA:  Tachypnea, neurological deficit EXAM: CT HEAD WITHOUT CONTRAST TECHNIQUE: Contiguous axial images were obtained from the base of the skull through the vertex without intravenous contrast. COMPARISON:  06/28/2016 FINDINGS:  Brain: No evidence of acute infarction, hemorrhage, extra-axial collection, ventriculomegaly, or mass effect. Generalized cerebral atrophy. Periventricular white matter low attenuation likely secondary to microangiopathy. Vascular: Cerebrovascular atherosclerotic calcifications are noted. Skull: Negative for fracture or focal lesion. Sinuses/Orbits: Visualized portions of the orbits are unremarkable. Visualized portions of the paranasal sinuses are unremarkable. Visualized portions of the mastoid air cells are unremarkable. Other: None. IMPRESSION: No acute intracranial pathology. Electronically Signed   By: Kathreen Devoid   On: 07/19/2019 18:23   NM Pulmonary Perfusion  Result Date: 07/19/2019 CLINICAL DATA:  S respiratory failure, intubation, sepsis, respiratory distress with hypoxia, severe shortness of breath and cough, fatigue, history of CHF, COPD, diabetes mellitus, stage III chronic kidney disease, was COVID positive in November 2020 EXAM: NUCLEAR MEDICINE PERFUSION LUNG SCAN TECHNIQUE: Perfusion images were obtained in multiple projections after intravenous injection of radiopharmaceutical. Ventilation scan not performed, patient on ventilator. RADIOPHARMACEUTICALS:  4.16 mCi Tc-29m MAA IV COMPARISON:  None Correlation: Chest radiograph 07/19/2019 FINDINGS: Diminished perfusion at anterior segment of RIGHT middle lobe adjacent to minor fissure corresponding to more focal infiltrate on chest radiography. Generally diminished perfusion in LEFT lower lobe likely reflecting slight prominence of the cardiac silhouette, small pleural effusion, and infiltrates. Contour abnormalities of the posterior lower lobes from small pleural effusions. No other segmental or subsegmental perfusion defects identified. Pattern favors parenchymal lung disease rather than pulmonary embolism. No ventilation exam for correlation. IMPRESSION: Subsegmental perfusion defect in RIGHT upper lobe adjacent to minor fissure corresponding to focal infiltrate on chest radiograph. No other segmental or subsegmental perfusion defects are identified. In the absence of a ventilation exam, findings favor parenchymal lung disease and likely represent a low likelihood for pulmonary embolism. Electronically Signed   By: Lavonia Dana M.D.   On: 07/19/2019 18:21   DG Chest Portable 1 View  Result Date: 07/19/2019 CLINICAL DATA:  Intubation. EXAM: PORTABLE CHEST 1 VIEW COMPARISON:  Radiograph yesterday. FINDINGS: Endotracheal tube tip 4.1 cm from the carina. Enteric tube in place with tip below the diaphragm. Median sternotomy. Unchanged heart size and  mediastinal contours. Left pleural effusion is similar. Probable developing right pleural effusion. Diffuse interstitial opacities with slight worsening in the right upper lobe. Additional patchy opacity at the right lung base. Vascular stent in the region of the left subclavian. No pneumothorax. IMPRESSION: 1. Endotracheal tube tip 4.1 cm from the carina. Enteric tube in place with tip below the diaphragm. 2. Patchy opacities in the right lower lobe, and developing in the right upper lobe, favoring infection. Background interstitial thickening may be infectious or pulmonary edema. 3. Bilateral pleural effusions. Electronically Signed   By: Keith Rake M.D.   On: 07/19/2019 01:49   DG Chest Portable 1 View  Result Date: 07/10/2019 CLINICAL DATA:  Shortness of breath EXAM: PORTABLE CHEST 1 VIEW COMPARISON:  June 25, 2019 FINDINGS: Again noted are pulmonary opacities bilaterally with increasing consolidation at the right base. There are small bilateral pleural effusions, left greater than right. The patient is status post prior median sternotomy. The heart size remains enlarged. There is no pneumothorax. There are prominent interstitial lung markings bilaterally. Hazy airspace opacities are noted bilaterally IMPRESSION: 1. Bilateral airspace opacities, greatest at the right lower lobe, concerning for multifocal pneumonia. 2. Small bilateral pleural effusions, left greater than right. Electronically Signed   By: Constance Holster M.D.   On: 07/21/2019 22:15   ECHOCARDIOGRAM COMPLETE  Result Date: 07/20/2019   ECHOCARDIOGRAM REPORT   Patient Name:   Andrea NEKOLA  [name] Date of Exam: 07/20/2019 Medical Rec #:  [id number]        Height:       62.0 in Accession #:    [id number]       Weight:       140.0 lb Date of Birth:  Sep 10, 1945        BSA:          1.64 m Patient Age:    74 years         BP:           120/46 mmHg Patient Gender: F                HR:           91 bpm. Exam Location:  [facility] Procedure: 2D  Echo, Cardiac Doppler and Color Doppler Indications:     CHF 428.21  History:         Patient has prior history of Echocardiogram examinations. CHF,                  COPD; Risk Factors:Hypertension.  Sonographer:     Alyse Low Roar Referring Phys:  [id number] Ottie Glazier Diagnosing Phys: Ida Rogue MD IMPRESSIONS  1. Left ventricular ejection fraction, by visual estimation, is 25 to 30%. The left ventricle has severely decreased function. There is no left ventricular hypertrophy.  2. Left ventricular diastolic parameters are consistent with Grade II diastolic dysfunction (pseudonormalization).  3. Mild to moderately dilated left ventricular internal cavity size.  4. The left ventricle demonstrates global hypokinesis, severe hypokinesis of the anterior and anteroseptal wall.  5. Global right ventricle has low normal systolic function.The right ventricular size is normal. No increase in right ventricular wall thickness.  6. Left atrial size was mildly dilated.  7. Mildly elevated pulmonary artery systolic pressure.  8. Pleural effusion noted on the left, estimated at 5 cm FINDINGS  Left Ventricle: Left ventricular ejection fraction, by visual estimation, is 25 to 30%. The left ventricle has severely decreased function. The left ventricle demonstrates global hypokinesis. The left ventricular internal cavity size was mildly to moderately dilated left ventricle. There is no left ventricular hypertrophy. Left ventricular diastolic parameters are consistent with Grade II diastolic dysfunction (pseudonormalization). Normal left atrial pressure. Right Ventricle: The right ventricular size is normal. No increase in right ventricular wall thickness. Global RV systolic function is has low normal systolic function. The tricuspid regurgitant velocity is 2.57 m/s, and with an assumed right atrial pressure of 10 mmHg, the estimated right ventricular systolic pressure is mildly elevated at 36.4 mmHg. Left Atrium: Left atrial size  was mildly dilated. Right Atrium: Right atrial size was normal in size Pericardium: There is no evidence of pericardial effusion. Mitral Valve: The mitral valve is normal in structure. Mild mitral valve regurgitation. No evidence of mitral valve stenosis by observation. Tricuspid Valve: The tricuspid valve is normal in structure. Tricuspid valve regurgitation is mild-moderate. Aortic Valve: The aortic valve was not well visualized. Aortic valve regurgitation is not visualized. The aortic valve is structurally normal, with no evidence of sclerosis or stenosis. Aortic valve mean gradient measures 4.0 mmHg. Aortic valve peak gradient measures 6.5 mmHg. Aortic valve area, by VTI measures 1.86 cm. Pulmonic Valve: The pulmonic valve was normal in structure. Pulmonic valve regurgitation is not visualized. Pulmonic regurgitation is not visualized. Aorta: The aortic root, ascending aorta and aortic arch are all structurally normal, with no evidence of dilitation or obstruction. Venous: The inferior vena cava is normal  in size with greater than 50% respiratory variability, suggesting right atrial pressure of 3 mmHg. IAS/Shunts: No atrial level shunt detected by color flow Doppler. There is no evidence of a patent foramen ovale. No ventricular septal defect is seen or detected. There is no evidence of an atrial septal defect.  LEFT VENTRICLE PLAX 2D LVIDd:         4.89 cm       Diastology LVIDs:         4.22 cm       LV e' lateral:   9.16 cm/s LV PW:         1.05 cm       LV E/e' lateral: 11.7 LV IVS:        0.93 cm       LV e' medial:    4.38 cm/s LVOT diam:     1.70 cm       LV E/e' medial:  24.4 LV SV:         33 ml LV SV Index:   19.52 LVOT Area:     2.27 cm  LV Volumes (MOD) LV area d, A2C:    29.50 cm LV area d, A4C:    28.30 cm LV area s, A2C:    23.30 cm LV area s, A4C:    23.00 cm LV major d, A2C:   6.78 cm LV major d, A4C:   6.69 cm LV major s, A2C:   6.21 cm LV major s, A4C:   6.51 cm LV vol d, MOD A2C: 103.0  ml LV vol d, MOD A4C: 98.0 ml LV vol s, MOD A2C: 71.8 ml LV vol s, MOD A4C: 67.0 ml LV SV MOD A2C:     31.2 ml LV SV MOD A4C:     98.0 ml LV SV MOD BP:      30.2 ml RIGHT VENTRICLE RV Mid diam:    3.15 cm TAPSE (M-mode): 1.2 cm LEFT ATRIUM             Index       RIGHT ATRIUM           Index LA diam:        4.30 cm 2.62 cm/m  RA Area:     10.70 cm LA Vol (A2C):   53.1 ml 32.32 ml/m RA Volume:   19.90 ml  12.11 ml/m LA Vol (A4C):   40.2 ml 24.47 ml/m LA Biplane Vol: 47.3 ml 28.79 ml/m  AORTIC VALVE                   PULMONIC VALVE AV Area (Vmax):    2.09 cm    PV Vmax:        1.09 m/s AV Area (Vmean):   1.96 cm    PV Peak grad:   4.8 mmHg AV Area (VTI):     1.86 cm    RVOT Peak grad: 2 mmHg AV Vmax:           127.00 cm/s AV Vmean:          94.300 cm/s AV VTI:            0.238 m AV Peak Grad:      6.5 mmHg AV Mean Grad:      4.0 mmHg LVOT Vmax:         117.00 cm/s LVOT Vmean:        81.300 cm/s LVOT VTI:  0.195 m LVOT/AV VTI ratio: 0.82  AORTA Ao Root diam: 2.70 cm MITRAL VALVE                        TRICUSPID VALVE MV Area (PHT): 5.62 cm             TR Peak grad:   26.4 mmHg MV PHT:        39.15 msec           TR Vmax:        257.00 cm/s MV Decel Time: 135 msec MV E velocity: 107.00 cm/s 103 cm/s SHUNTS                                     Systemic VTI:  0.20 m                                     Systemic Diam: 1.70 cm  Ida Rogue MD Electronically signed by Ida Rogue MD Signature Date/Time: 07/20/2019/1:28:48 PM    Final      Echo moderate to severely depressed left ventricular function 25 to 30%  TELEMETRY: Atrial fibrillation rate controlled:  ASSESSMENT AND PLAN:  Principal Problem:   Pneumonia Active Problems:   CKD (chronic kidney disease) stage 3, GFR 30-59 ml/min   Pulmonary embolism on right (HCC)   Acute on chronic respiratory failure with hypoxemia (HCC)   Multifocal pneumonia   Type 2 diabetes mellitus with complication, without long-term current use of insulin  (HCC)   AF (paroxysmal atrial fibrillation) (HCC) Cardiomyopathy Congestive heart failure systolic dysfunction Possible Covid pneumonitis . Plan Patient currently extubated continue supplemental oxygen Continue empiric broad-spectrum antibiotic therapy Continue respiratory support Maintain diabetes management and control Long-term anticoagulation because of atrial fibrillation Follow-up pulmonary for respiratory failure pneumonia Patient probably will need physical therapy Will consider further cardiac evaluation because of cardiomyopathy and an elevated troponin recommend deferring cardiac cath for now until patient recovers Probably consider cardiac catheter later date probably as an outpatient Recommend ARB ACE inhibitor diuretics for systolic dysfunction     Yolonda Kida, MD  07/20/2019 7:27 PM

## 2019-07-21 ENCOUNTER — Inpatient Hospital Stay: Payer: Medicare Other

## 2019-07-21 DIAGNOSIS — J189 Pneumonia, unspecified organism: Secondary | ICD-10-CM

## 2019-07-21 DIAGNOSIS — Z515 Encounter for palliative care: Secondary | ICD-10-CM

## 2019-07-21 DIAGNOSIS — J9621 Acute and chronic respiratory failure with hypoxia: Secondary | ICD-10-CM

## 2019-07-21 DIAGNOSIS — Z7189 Other specified counseling: Secondary | ICD-10-CM

## 2019-07-21 LAB — COMPREHENSIVE METABOLIC PANEL
ALT: 94 U/L — ABNORMAL HIGH (ref 0–44)
AST: 63 U/L — ABNORMAL HIGH (ref 15–41)
Albumin: 2.3 g/dL — ABNORMAL LOW (ref 3.5–5.0)
Alkaline Phosphatase: 91 U/L (ref 38–126)
Anion gap: 10 (ref 5–15)
BUN: 63 mg/dL — ABNORMAL HIGH (ref 8–23)
CO2: 24 mmol/L (ref 22–32)
Calcium: 8.9 mg/dL (ref 8.9–10.3)
Chloride: 105 mmol/L (ref 98–111)
Creatinine, Ser: 2.19 mg/dL — ABNORMAL HIGH (ref 0.44–1.00)
GFR calc Af Amer: 25 mL/min — ABNORMAL LOW (ref >60)
GFR calc non Af Amer: 22 mL/min — ABNORMAL LOW (ref >60)
Glucose, Bld: 164 mg/dL — ABNORMAL HIGH (ref 70–99)
Potassium: 6 mmol/L — ABNORMAL HIGH (ref 3.5–5.1)
Sodium: 139 mmol/L (ref 135–145)
Total Bilirubin: 0.6 mg/dL (ref 0.3–1.2)
Total Protein: 5.7 g/dL — ABNORMAL LOW (ref 6.5–8.1)

## 2019-07-21 LAB — CBC WITH DIFFERENTIAL/PLATELET
Abs Immature Granulocytes: 0.26 10*3/uL — ABNORMAL HIGH (ref 0.00–0.07)
Basophils Absolute: 0 10*3/uL (ref 0.0–0.1)
Basophils Relative: 0 %
Eosinophils Absolute: 0 10*3/uL (ref 0.0–0.5)
Eosinophils Relative: 0 %
HCT: 22.9 % — ABNORMAL LOW (ref 36.0–46.0)
Hemoglobin: 6.9 g/dL — ABNORMAL LOW (ref 12.0–15.0)
Immature Granulocytes: 2 %
Lymphocytes Relative: 5 %
Lymphs Abs: 0.6 10*3/uL — ABNORMAL LOW (ref 0.7–4.0)
MCH: 27.3 pg (ref 26.0–34.0)
MCHC: 30.1 g/dL (ref 30.0–36.0)
MCV: 90.5 fL (ref 80.0–100.0)
Monocytes Absolute: 0.9 10*3/uL (ref 0.1–1.0)
Monocytes Relative: 8 %
Neutro Abs: 10 10*3/uL — ABNORMAL HIGH (ref 1.7–7.7)
Neutrophils Relative %: 85 %
Platelets: 349 10*3/uL (ref 150–400)
RBC: 2.53 MIL/uL — ABNORMAL LOW (ref 3.87–5.11)
RDW: 17.4 % — ABNORMAL HIGH (ref 11.5–15.5)
WBC: 11.8 10*3/uL — ABNORMAL HIGH (ref 4.0–10.5)
nRBC: 0.2 % (ref 0.0–0.2)

## 2019-07-21 LAB — GLUCOSE, CAPILLARY
Glucose-Capillary: 151 mg/dL — ABNORMAL HIGH (ref 70–99)
Glucose-Capillary: 156 mg/dL — ABNORMAL HIGH (ref 70–99)
Glucose-Capillary: 161 mg/dL — ABNORMAL HIGH (ref 70–99)
Glucose-Capillary: 162 mg/dL — ABNORMAL HIGH (ref 70–99)
Glucose-Capillary: 222 mg/dL — ABNORMAL HIGH (ref 70–99)
Glucose-Capillary: 278 mg/dL — ABNORMAL HIGH (ref 70–99)
Glucose-Capillary: 51 mg/dL — ABNORMAL LOW (ref 70–99)

## 2019-07-21 LAB — PROTIME-INR
INR: 2.9 — ABNORMAL HIGH (ref 0.8–1.2)
Prothrombin Time: 30.3 seconds — ABNORMAL HIGH (ref 11.4–15.2)

## 2019-07-21 LAB — HEMOGLOBIN AND HEMATOCRIT, BLOOD
HCT: 23.7 % — ABNORMAL LOW (ref 36.0–46.0)
Hemoglobin: 7.3 g/dL — ABNORMAL LOW (ref 12.0–15.0)

## 2019-07-21 LAB — POTASSIUM: Potassium: 4.6 mmol/L (ref 3.5–5.1)

## 2019-07-21 LAB — PHOSPHORUS: Phosphorus: 4.9 mg/dL — ABNORMAL HIGH (ref 2.5–4.6)

## 2019-07-21 LAB — MAGNESIUM: Magnesium: 1.7 mg/dL (ref 1.7–2.4)

## 2019-07-21 MED ORDER — FENTANYL 2500MCG IN NS 250ML (10MCG/ML) PREMIX INFUSION: 25.0000 ug/h | INTRAVENOUS | Status: DC

## 2019-07-21 MED ORDER — VECURONIUM BROMIDE 10 MG IV SOLR
10.0000 mg | Freq: Once | INTRAVENOUS | Status: AC
  Administered 2019-07-21: 10 mg via INTRAVENOUS
  Filled 2019-07-21: qty 10

## 2019-07-21 MED ORDER — FAMOTIDINE 20 MG PO TABS
20.0000 mg | ORAL_TABLET | Freq: Every day | ORAL | Status: DC
  Administered 2019-07-21 – 2019-07-31 (×11): 20 mg
  Filled 2019-07-21 (×11): qty 1

## 2019-07-21 MED ORDER — FENTANYL BOLUS VIA INFUSION
25.0000 ug | INTRAVENOUS | Status: DC | PRN
  Filled 2019-07-21: qty 25

## 2019-07-21 MED ORDER — HEPARIN (PORCINE) 25000 UT/250ML-% IV SOLN
900.0000 [IU]/h | INTRAVENOUS | Status: DC
  Administered 2019-07-21: 900 [IU]/h via INTRAVENOUS
  Filled 2019-07-21: qty 250

## 2019-07-21 MED ORDER — PRISMASOL BGK 4/2.5 32-4-2.5 MEQ/L REPLACEMENT SOLN
Status: DC
  Filled 2019-07-21: qty 5000

## 2019-07-21 MED ORDER — AMIODARONE HCL IN DEXTROSE 360-4.14 MG/200ML-% IV SOLN
60.0000 mg/h | INTRAVENOUS | Status: DC
  Administered 2019-07-21 – 2019-07-22 (×2): 60 mg/h via INTRAVENOUS
  Filled 2019-07-21 (×2): qty 200

## 2019-07-21 MED ORDER — PRISMASOL BGK 4/2.5 32-4-2.5 MEQ/L IV SOLN
INTRAVENOUS | Status: DC
  Filled 2019-07-21: qty 5000

## 2019-07-21 MED ORDER — DEXMEDETOMIDINE HCL IN NACL 400 MCG/100ML IV SOLN
0.4000 ug/kg/h | INTRAVENOUS | Status: DC
  Administered 2019-07-21 – 2019-07-22 (×4): 0.8 ug/kg/h via INTRAVENOUS
  Administered 2019-07-23: 12:00:00 1 ug/kg/h via INTRAVENOUS
  Administered 2019-07-23: 04:00:00 0.8 ug/kg/h via INTRAVENOUS
  Administered 2019-07-24 (×4): 1 ug/kg/h via INTRAVENOUS
  Administered 2019-07-25: 1.2 ug/kg/h via INTRAVENOUS
  Administered 2019-07-25: 04:00:00 1 ug/kg/h via INTRAVENOUS
  Administered 2019-07-25 – 2019-07-27 (×8): 1.2 ug/kg/h via INTRAVENOUS
  Filled 2019-07-21 (×21): qty 100

## 2019-07-21 MED ORDER — DEXTROSE 50 % IV SOLN
1.0000 | Freq: Once | INTRAVENOUS | Status: AC
  Administered 2019-07-21: 50 mL via INTRAVENOUS
  Filled 2019-07-21: qty 50

## 2019-07-21 MED ORDER — AMIODARONE HCL IN DEXTROSE 360-4.14 MG/200ML-% IV SOLN: 30.0000 mg/h | INTRAVENOUS | Status: DC

## 2019-07-21 MED ORDER — AMIODARONE HCL IN DEXTROSE 360-4.14 MG/200ML-% IV SOLN
30.0000 mg/h | INTRAVENOUS | Status: DC
  Administered 2019-07-22 – 2019-07-29 (×13): 30 mg/h via INTRAVENOUS
  Filled 2019-07-21 (×14): qty 200

## 2019-07-21 MED ORDER — MAGNESIUM SULFATE 2 GM/50ML IV SOLN
2.0000 g | Freq: Once | INTRAVENOUS | Status: AC
  Administered 2019-07-21: 2 g via INTRAVENOUS
  Filled 2019-07-21: qty 50

## 2019-07-21 MED ORDER — INSULIN ASPART 100 UNIT/ML ~~LOC~~ SOLN
0.0000 [IU] | SUBCUTANEOUS | Status: DC
  Administered 2019-07-21: 2 [IU] via SUBCUTANEOUS
  Administered 2019-07-21: 20:00:00 3 [IU] via SUBCUTANEOUS
  Administered 2019-07-21 – 2019-07-22 (×3): 1 [IU] via SUBCUTANEOUS
  Filled 2019-07-21 (×5): qty 1

## 2019-07-21 MED ORDER — SENNOSIDES-DOCUSATE SODIUM 8.6-50 MG PO TABS
2.0000 | ORAL_TABLET | Freq: Two times a day (BID) | ORAL | Status: DC
  Administered 2019-07-21 – 2019-07-24 (×7): 2
  Filled 2019-07-21 (×8): qty 2

## 2019-07-21 MED ORDER — DEXTROSE 50 % IV SOLN
INTRAVENOUS | Status: AC
  Filled 2019-07-21: qty 50

## 2019-07-21 MED ORDER — STERILE WATER FOR INJECTION IJ SOLN
INTRAMUSCULAR | Status: AC
  Administered 2019-07-21: 17:00:00 1 mL
  Filled 2019-07-21: qty 10

## 2019-07-21 MED ORDER — FENTANYL CITRATE (PF) 100 MCG/2ML IJ SOLN
100.0000 ug | Freq: Once | INTRAMUSCULAR | Status: AC
  Administered 2019-07-21: 100 ug via INTRAVENOUS
  Filled 2019-07-21: qty 2

## 2019-07-21 MED ORDER — MIDAZOLAM HCL 2 MG/2ML IJ SOLN
1.0000 mg | INTRAMUSCULAR | Status: AC | PRN
  Administered 2019-07-21 – 2019-07-24 (×3): 1 mg via INTRAVENOUS
  Filled 2019-07-21 (×2): qty 2

## 2019-07-21 MED ORDER — MIDAZOLAM HCL 2 MG/2ML IJ SOLN
4.0000 mg | Freq: Once | INTRAMUSCULAR | Status: AC
  Administered 2019-07-21: 4 mg via INTRAVENOUS
  Filled 2019-07-21: qty 4

## 2019-07-21 MED ORDER — DEXTROSE 50 % IV SOLN
1.0000 | Freq: Once | INTRAVENOUS | Status: AC
  Administered 2019-07-21: 50 mL via INTRAVENOUS

## 2019-07-21 MED ORDER — HEPARIN SODIUM (PORCINE) 5000 UNIT/ML IJ SOLN
INTRAMUSCULAR | Status: AC
  Administered 2019-07-21: 12:00:00 5000 [IU]
  Filled 2019-07-21: qty 1

## 2019-07-21 MED ORDER — SODIUM CHLORIDE 0.9 % IV SOLN
3.0000 g | Freq: Two times a day (BID) | INTRAVENOUS | Status: DC
  Administered 2019-07-21 – 2019-07-22 (×3): 3 g via INTRAVENOUS
  Filled 2019-07-21 (×2): qty 3
  Filled 2019-07-21: qty 8
  Filled 2019-07-21: qty 3

## 2019-07-21 MED ORDER — FENTANYL CITRATE (PF) 100 MCG/2ML IJ SOLN
25.0000 ug | INTRAMUSCULAR | Status: DC | PRN
  Administered 2019-07-21 (×3): 100 ug via INTRAVENOUS
  Filled 2019-07-21 (×3): qty 2

## 2019-07-21 MED ORDER — MIDAZOLAM HCL 2 MG/2ML IJ SOLN
1.0000 mg | INTRAMUSCULAR | Status: DC | PRN
  Administered 2019-07-24 – 2019-07-28 (×4): 1 mg via INTRAVENOUS
  Filled 2019-07-21 (×5): qty 2

## 2019-07-21 MED ORDER — NOREPINEPHRINE 4 MG/250ML-% IV SOLN
0.0000 ug/min | INTRAVENOUS | Status: DC
  Administered 2019-07-21: 2 ug/min via INTRAVENOUS

## 2019-07-21 MED ORDER — FENTANYL CITRATE (PF) 100 MCG/2ML IJ SOLN: 25.0000 ug | INTRAMUSCULAR | Status: DC | PRN

## 2019-07-21 MED ORDER — HEPARIN SODIUM (PORCINE) 1000 UNIT/ML DIALYSIS
1000.0000 [IU] | INTRAMUSCULAR | Status: DC | PRN
  Administered 2019-07-26 – 2019-07-27 (×4): 1000 [IU] via INTRAVENOUS_CENTRAL
  Administered 2019-07-31: 16:00:00 2800 [IU] via INTRAVENOUS_CENTRAL
  Filled 2019-07-21: qty 6
  Filled 2019-07-21: qty 3
  Filled 2019-07-21: qty 6
  Filled 2019-07-21: qty 4
  Filled 2019-07-21 (×3): qty 6
  Filled 2019-07-21: qty 2
  Filled 2019-07-21: qty 6

## 2019-07-21 MED ORDER — INSULIN ASPART 100 UNIT/ML IV SOLN
10.0000 [IU] | Freq: Once | INTRAVENOUS | Status: AC
  Administered 2019-07-21: 07:00:00 10 [IU] via INTRAVENOUS
  Filled 2019-07-21: qty 0.1

## 2019-07-21 MED ORDER — HEPARIN (PORCINE) 25000 UT/250ML-% IV SOLN: 900.0000 [IU]/h | INTRAVENOUS | Status: DC

## 2019-07-21 NOTE — Consult Note (Signed)
ANTICOAGULATION CONSULT NOTE - Initial Consult  Pharmacy Consult for Heparin Indication: atrial fibrillation  Allergies  Allergen Reactions  . Metformin And Related Diarrhea  . Sulfur Hives    Patient Measurements: Height: 5\' 2"  (157.5 cm) Weight: 140 lb (63.5 kg) IBW/kg (Calculated) : 50.1 Heparin Dosing Weight:  63.5 (actual body weight)  Vital Signs: Temp: 97.8 F (36.6 C) (01/25 0400) Temp Source: Axillary (01/25 0400) BP: 124/61 (01/25 0700) Pulse Rate: 126 (01/25 0700)  Labs: Recent Labs    07/05/2019 2139 07/06/2019 2139 07/19/19 0305 07/19/19 1403 07/19/19 1711 07/20/19 0500 07/20/19 0500 07/20/19 1052 07/20/19 1314 07/21/19 0446 07/21/19 0930  HGB 8.4*   < >  --   --   --  7.3*   < >  --   --  6.9* 7.3*  HCT 27.7*   < >  --   --   --  23.5*  --   --   --  22.9* 23.7*  PLT 447*  --   --   --   --  384  --   --   --  349  --   LABPROT 20.7*  --   --   --   --   --   --   --   --   --   --   INR 1.8*  --   --   --   --   --   --   --   --   --   --   CREATININE 1.78*   < > 1.82*  --   --  2.00*  --   --   --  2.19*  --   TROPONINIHS 90*   < > 599*   < > 3,009*  --   --  1,404* 1,502*  --   --    < > = values in this interval not displayed.    Estimated Creatinine Clearance: 20.3 mL/min (A) (by C-G formula based on SCr of 2.19 mg/dL (H)).   Medical History: Past Medical History:  Diagnosis Date  . Arrhythmia    atrial fibrillation  . CHF (congestive heart failure) ([place])   . COPD (chronic obstructive pulmonary disease) ([place])   . Diabetes mellitus without complication ([place])   . Heart attack ([place])   . Hypertension   . Renal disorder     Assessment: 74 year-old female admitted with COVID 19 pneumonitis and severe COPD.  She is currently on apixaban at home for chronic atrial fibrillation.  Last inpatient dose of apixaban 1/24 @ 2200.  Will continue to monitor hemoglobin, which is low in the setting of CKD.  Hgb 7.3 (slightly up from yesterday), plt wnl.   On 1/25, patient was intubated and received central line.  Goal of Therapy:  Heparin level 0.3-0.7 units/ml Monitor platelets by anticoagulation protocol: Yes   Plan:  Will start continuous heparin infusion at 900 units/hr with no bolus, as patient is elderly and anemic.  Per RN, no signs of bleeding currently.  Will follow APTT only for now as patient received dose of apixaban last night, and will order 8-hour APTT for 1/26 at 0100.  Will order baseline labs and get CBC with AM labs.  Gerald Dexter, PharmD 07/21/2019 1:40 PM

## 2019-07-21 NOTE — Progress Notes (Signed)
PT Cancellation Note  Patient Details Name: Andrea [name] MRN: [id number] DOB: 06-28-1945   Cancelled Treatment:    Reason Eval/Treat Not Completed: Medical issues which prohibited therapy: Pt's Ka 6.0 with resting HR 126 bpm.  Pt's Ka currently falls outside guidelines for participation with PT services.  Will attempt to see pt at a future date/time as medically appropriate.     Linus Salmons PT, DPT 07/21/19, 9:26 AM

## 2019-07-21 NOTE — Progress Notes (Signed)
Andrea [name] visited pt. after consulting w/RN; RN reported pt. was 'really anxious' earlier today.  Andrea [name]) present at bedside.  Pt. awake and alert, but wearing resp. facemask and unable to speak clearly.  Grandson shared pt. has been at [name] since Friday; pt. has other family in the area, including additional grandchildren; appears sufficiently supported socially.  Pt. apologetic she could not speak; [place] informed pt. and grandson of [place] availability; no needs expressed at this time.       07/21/19 1300  Clinical Encounter Type  Visited With Health care provider;Patient and family together  Visit Type Initial;Psychological support;Spiritual support;Social support;Critical Care  Referral From Nurse  Consult/Referral To Chaplain  Spiritual Encounters  Spiritual Needs Emotional  Stress Factors  Patient Stress Factors Exhausted;Health changes  Family Stress Factors Major life changes;Health changes;Exhausted

## 2019-07-21 NOTE — Progress Notes (Signed)
Inpatient Diabetes Program Recommendations  AACE/ADA: New Consensus Statement on Inpatient Glycemic Control (2015)  Target Ranges:  Prepandial:   less than 140 mg/dL      Peak postprandial:   less than 180 mg/dL (1-2 hours)      Critically ill patients:  140 - 180 mg/dL   Lab Results  Component Value Date   GLUCAP 162 (H) 07/21/2019   HGBA1C 7.9 (H) 07/17/2019    Review of Glycemic Control Results for Andrea [name], Andrea [name] (MRN [id number]) as of 07/21/2019 12:50  Ref. Range 07/20/2019 23:57 07/21/2019 03:39 07/21/2019 07:25 07/21/2019 11:38 07/21/2019 12:12  Glucose-Capillary Latest Ref Range: 70 - 99 mg/dL 230 (H) Novolog 7 units 151 (H) Novolog 4 units 161 (H) Novolog 4 units 51 (L) 162 (H)   Diabetes history: DM2 Outpatient Diabetes medications: Glucotrol 10 mg bid + Januvia 50 mg qd Current orders for Inpatient glycemic control: Lantus 10 units bid + Novolog resistant correction scale q 4 hrs.  Inpatient Diabetes Program Recommendations:   -Decrease Lantus to 10 units daily or change to Levemir 5 units bid -Decrease Novolog correction to sensitive scale  Thank you, Nani Gasser. Hanna Ra, RN, MSN, CDE  Diabetes Coordinator Inpatient Glycemic Control Team Team Pager [phone] (8am-5pm) 07/21/2019 1:02 PM

## 2019-07-21 NOTE — Progress Notes (Signed)
CRITICAL CARE NOTE  BRIEF PATIENT DESCRIPTION:  74 yo female dx with COVID-19 04/2019 admitted with acute on chronic renal failure with hyperkalemia, elevated troponin, and acute on chronic hypoxic hypercapnic respiratory failure secondary to bacterial pneumonia vs. 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 She presented to [facility] ER via EMS on 01/22 with worsening shortness of breath, cough, and fatigue.  At baseline pt wears chronic home O2 @2L , however due to symptoms she has had to increase her O2 to 4L 24hrs prior to current ER presentation.  En route to the ER EMS placed pt on 6L O2 and administered albuterol.  Upon arrival to the ER pt tachypneic with increased work of breathing requiring iv steroids, duonebs, and HFNC.  Lab results revealed Na+ 133, CO2 21, BUN 38, creatinine 1.78, glucose 300, anion gap 17, troponin 90, lactic acid 6.2, pct 1.11, wbc 13.7, hgb 8.4, and vbg pH 7.38/pCO2 42.  CXR concerning for multifocal pneumonia and small bilateral pleural effusions.  She received solumedrol, vancomycin, and cefepime.  She was subsequently admitted to the stepdown unit per hospitalist team for additional workup and treatment, however remained in the ER pending bed availability.  On 01/23 pt developed worsening acute hypoxic respiratory failure possibly due to flash pulmonary edema after receiving a total of 3L of fluids requiring mechanical intubation.  PCCM contacted to assume care.  Pt does have a hx of COVID-19 diagnosed 05/14/2020, which required hospitalization/treatment at [facility]. Pt discharged home on 05/20/2019.  However, she required hospitalization again at [facility] on  05/31/2019 due to multifocal pneumonia, following treatment she was discharged home on 06/29/2019.       CC  follow up respiratory failure  SUBJECTIVE Patient remains critically ill Prognosis is guarded Increased WOB and SOB and using accessory muscles to breathe    BP (!) 102/57   Pulse 100   Temp 97.8 F (36.6 C) (Axillary)   Resp 11   Ht 5' 2"  (1.575 m)   Wt 63.5 kg   SpO2 (!) 88%   BMI 25.61 kg/m    I/O last 3 completed shifts: In: 1219.3 [I.V.:1014; NG/GT:100; IV Piggyback:105.3] Out: 1265 [Urine:1265] No intake/output data recorded.  SpO2: (!) 88 % O2 Flow Rate (L/min): 4 L/min FiO2 (%): 36 %   SIGNIFICANT EVENTS SIGNIFICANT EVENTS/STUDIES:  01/22: Pt admitted to the stepdown unit with acute hypoxic respiratory failure secondary to multifocal pneumonia, however remained in the ER pending bed availability 01/23: Pt developed worsening acute hypoxic respiratory failure requiring emergent mechanical intubation in the ER.  PCCM contacted to assume care 1/24 - parameters for SBT met, patient s/p weaning trial with successful liberation from MV.  Updated daughter [name].   1/25 increased WOB and back on biPAP  REVIEW OF SYSTEMS  PATIENT IS UNABLE TO PROVIDE COMPLETE REVIEW OF SYSTEMS DUE TO SEVERE CRITICAL ILLNESS   PHYSICAL EXAMINATION:  GENERAL:critically ill appearing, +resp distress HEAD: Normocephalic, atraumatic.  EYES: Pupils equal, round, reactive to light.  No scleral icterus.  MOUTH: Moist mucosal membrane. NECK: Supple.  PULMONARY: +rhonchi, +wheezing CARDIOVASCULAR: S1 and S2. Regular rate and rhythm. No murmurs, rubs, or gallops.  GASTROINTESTINAL: Soft, nontender, -distended.  Positive bowel sounds.   MUSCULOSKELETAL:  No swelling, clubbing, or edema.  NEUROLOGIC: alert and awake SKIN:intact,warm,dry  MEDICATIONS: I have reviewed all medications and confirmed regimen as documented   CULTURE RESULTS   Recent Results (from the past 240  hour(s))  Culture, blood (Routine x 2)     Status: None (Preliminary result)   Collection Time: 07/20/2019  9:40 PM   Specimen: BLOOD  Result Value Ref Range Status   Specimen Description BLOOD RIGHT ANTECUBITAL  Final   Special Requests   Final    BOTTLES DRAWN AEROBIC AND ANAEROBIC Blood Culture adequate volume   Culture   Final    NO GROWTH 2 DAYS Performed at [facility], [address], [place], [place] [address]    Report Status PENDING  Incomplete  Culture, blood (Routine x 2)     Status: None (Preliminary result)   Collection Time: 07/05/2019  9:40 PM   Specimen: BLOOD  Result Value Ref Range Status   Specimen Description BLOOD LEFT ANTECUBITAL  Final   Special Requests   Final    BOTTLES DRAWN AEROBIC AND ANAEROBIC Blood Culture adequate volume   Culture   Final    NO GROWTH 2 DAYS Performed at [facility], [address] [place], [place] [address]    Report Status PENDING  Incomplete  MRSA PCR Screening     Status: None   Collection Time: 07/19/19  6:45 AM   Specimen: Nasal Mucosa; Nasopharyngeal  Result Value Ref Range Status   MRSA by PCR NEGATIVE NEGATIVE Final    Comment:        The GeneXpert MRSA Assay (FDA approved for NASAL specimens only), is one component of a comprehensive MRSA colonization surveillance program. It is not intended to diagnose MRSA infection nor to guide or monitor treatment for MRSA infections. Performed at [facility], [address] [place], [place] [address]   Respiratory Panel by PCR     Status: None   Collection Time: 07/19/19 10:03 AM   Specimen: Nasopharyngeal Swab; Respiratory  Result Value Ref Range Status   Adenovirus NOT DETECTED NOT DETECTED Final   Coronavirus 229E NOT DETECTED NOT DETECTED Final    Comment: (NOTE) The Coronavirus on the Respiratory Panel, DOES NOT test for the novel  Coronavirus (2019 nCoV)    Coronavirus HKU1 NOT DETECTED NOT DETECTED Final   Coronavirus NL63 NOT  DETECTED NOT DETECTED Final   Coronavirus OC43 NOT DETECTED NOT DETECTED Final   Metapneumovirus NOT DETECTED NOT DETECTED Final   Rhinovirus / Enterovirus NOT DETECTED NOT DETECTED Final   Influenza A NOT DETECTED NOT DETECTED Final   Influenza B NOT DETECTED NOT DETECTED Final   Parainfluenza Virus 1 NOT DETECTED NOT DETECTED Final   Parainfluenza Virus 2 NOT DETECTED NOT DETECTED Final   Parainfluenza Virus 3 NOT DETECTED NOT DETECTED Final   Parainfluenza Virus 4 NOT DETECTED NOT DETECTED Final   Respiratory Syncytial Virus NOT DETECTED NOT DETECTED Final   Bordetella pertussis NOT DETECTED NOT DETECTED Final   Chlamydophila pneumoniae NOT DETECTED NOT DETECTED Final   Mycoplasma pneumoniae NOT DETECTED NOT DETECTED Final    Comment: Performed at [facility], [address] [place], [place] [address]          IMAGING    ECHOCARDIOGRAM COMPLETE  Result Date: 07/20/2019   ECHOCARDIOGRAM REPORT   Patient Name:   CHARLAYNE VULTAGGIO Date of Exam: 07/20/2019 Medical Rec #:  [id number]        Height:  62.0 in Accession #:    [id number]       Weight:       140.0 lb Date of Birth:  [birth year]        BSA:          1.64 m Patient Age:    63 years         BP:           120/46 mmHg Patient Gender: F                HR:           91 bpm. Exam Location:  [facility] Procedure: 2D Echo, Cardiac Doppler and Color Doppler Indications:     CHF 428.21  History:         Patient has prior history of Echocardiogram examinations. CHF,                  COPD; Risk Factors:Hypertension.  Sonographer:     Alyse Low Roar Referring Phys:  [id number] Ottie Glazier Diagnosing Phys: Ida Rogue MD IMPRESSIONS  1. Left ventricular ejection fraction, by visual estimation, is 25 to 30%. The left ventricle has severely decreased function. There is no left ventricular hypertrophy.  2. Left ventricular diastolic parameters are consistent with Grade II diastolic dysfunction (pseudonormalization).  3. Mild to moderately dilated  left ventricular internal cavity size.  4. The left ventricle demonstrates global hypokinesis, severe hypokinesis of the anterior and anteroseptal wall.  5. Global right ventricle has low normal systolic function.The right ventricular size is normal. No increase in right ventricular wall thickness.  6. Left atrial size was mildly dilated.  7. Mildly elevated pulmonary artery systolic pressure.  8. Pleural effusion noted on the left, estimated at 5 cm FINDINGS  Left Ventricle: Left ventricular ejection fraction, by visual estimation, is 25 to 30%. The left ventricle has severely decreased function. The left ventricle demonstrates global hypokinesis. The left ventricular internal cavity size was mildly to moderately dilated left ventricle. There is no left ventricular hypertrophy. Left ventricular diastolic parameters are consistent with Grade II diastolic dysfunction (pseudonormalization). Normal left atrial pressure. Right Ventricle: The right ventricular size is normal. No increase in right ventricular wall thickness. Global RV systolic function is has low normal systolic function. The tricuspid regurgitant velocity is 2.57 m/s, and with an assumed right atrial pressure of 10 mmHg, the estimated right ventricular systolic pressure is mildly elevated at 36.4 mmHg. Left Atrium: Left atrial size was mildly dilated. Right Atrium: Right atrial size was normal in size Pericardium: There is no evidence of pericardial effusion. Mitral Valve: The mitral valve is normal in structure. Mild mitral valve regurgitation. No evidence of mitral valve stenosis by observation. Tricuspid Valve: The tricuspid valve is normal in structure. Tricuspid valve regurgitation is mild-moderate. Aortic Valve: The aortic valve was not well visualized. Aortic valve regurgitation is not visualized. The aortic valve is structurally normal, with no evidence of sclerosis or stenosis. Aortic valve mean gradient measures 4.0 mmHg. Aortic valve peak  gradient measures 6.5 mmHg. Aortic valve area, by VTI measures 1.86 cm. Pulmonic Valve: The pulmonic valve was normal in structure. Pulmonic valve regurgitation is not visualized. Pulmonic regurgitation is not visualized. Aorta: The aortic root, ascending aorta and aortic arch are all structurally normal, with no evidence of dilitation or obstruction. Venous: The inferior vena cava is normal in size with greater than 50% respiratory variability, suggesting right atrial pressure of 3 mmHg. IAS/Shunts: No atrial level shunt detected by color flow  Doppler. There is no evidence of a patent foramen ovale. No ventricular septal defect is seen or detected. There is no evidence of an atrial septal defect.  LEFT VENTRICLE PLAX 2D LVIDd:         4.89 cm       Diastology LVIDs:         4.22 cm       LV e' lateral:   9.16 cm/s LV PW:         1.05 cm       LV E/e' lateral: 11.7 LV IVS:        0.93 cm       LV e' medial:    4.38 cm/s LVOT diam:     1.70 cm       LV E/e' medial:  24.4 LV SV:         33 ml LV SV Index:   19.52 LVOT Area:     2.27 cm  LV Volumes (MOD) LV area d, A2C:    29.50 cm LV area d, A4C:    28.30 cm LV area s, A2C:    23.30 cm LV area s, A4C:    23.00 cm LV major d, A2C:   6.78 cm LV major d, A4C:   6.69 cm LV major s, A2C:   6.21 cm LV major s, A4C:   6.51 cm LV vol d, MOD A2C: 103.0 ml LV vol d, MOD A4C: 98.0 ml LV vol s, MOD A2C: 71.8 ml LV vol s, MOD A4C: 67.0 ml LV SV MOD A2C:     31.2 ml LV SV MOD A4C:     98.0 ml LV SV MOD BP:      30.2 ml RIGHT VENTRICLE RV Mid diam:    3.15 cm TAPSE (M-mode): 1.2 cm LEFT ATRIUM             Index       RIGHT ATRIUM           Index LA diam:        4.30 cm 2.62 cm/m  RA Area:     10.70 cm LA Vol (A2C):   53.1 ml 32.32 ml/m RA Volume:   19.90 ml  12.11 ml/m LA Vol (A4C):   40.2 ml 24.47 ml/m LA Biplane Vol: 47.3 ml 28.79 ml/m  AORTIC VALVE                   PULMONIC VALVE AV Area (Vmax):    2.09 cm    PV Vmax:        1.09 m/s AV Area (Vmean):   1.96 cm     PV Peak grad:   4.8 mmHg AV Area (VTI):     1.86 cm    RVOT Peak grad: 2 mmHg AV Vmax:           127.00 cm/s AV Vmean:          94.300 cm/s AV VTI:            0.238 m AV Peak Grad:      6.5 mmHg AV Mean Grad:      4.0 mmHg LVOT Vmax:         117.00 cm/s LVOT Vmean:        81.300 cm/s LVOT VTI:          0.195 m LVOT/AV VTI ratio: 0.82  AORTA Ao Root diam: 2.70 cm MITRAL VALVE  TRICUSPID VALVE MV Area (PHT): 5.62 cm             TR Peak grad:   26.4 mmHg MV PHT:        39.15 msec           TR Vmax:        257.00 cm/s MV Decel Time: 135 msec MV E velocity: 107.00 cm/s 103 cm/s SHUNTS                                     Systemic VTI:  0.20 m                                     Systemic Diam: 1.70 cm  Ida Rogue MD Electronically signed by Ida Rogue MD Signature Date/Time: 07/20/2019/1:28:48 PM    Final     CBC    Component Value Date/Time   WBC 11.8 (H) 07/21/2019 0446   RBC 2.53 (L) 07/21/2019 0446   HGB 6.9 (L) 07/21/2019 0446   HCT 22.9 (L) 07/21/2019 0446   PLT 349 07/21/2019 0446   MCV 90.5 07/21/2019 0446   MCH 27.3 07/21/2019 0446   MCHC 30.1 07/21/2019 0446   RDW 17.4 (H) 07/21/2019 0446   LYMPHSABS 0.6 (L) 07/21/2019 0446   MONOABS 0.9 07/21/2019 0446   EOSABS 0.0 07/21/2019 0446   BASOSABS 0.0 07/21/2019 0446   BMP Latest Ref Rng & Units 07/21/2019 07/20/2019 07/19/2019  Glucose 70 - 99 mg/dL 164(H) 185(H) 426(H)  BUN 8 - 23 mg/dL 63(H) 55(H) 43(H)  Creatinine 0.44 - 1.00 mg/dL 2.19(H) 2.00(H) 1.82(H)  Sodium 135 - 145 mmol/L 139 134(L) 132(L)  Potassium 3.5 - 5.1 mmol/L 6.0(H) 4.1 5.4(H)  Chloride 98 - 111 mmol/L 105 100 97(L)  CO2 22 - 32 mmol/L 24 21(L) 23  Calcium 8.9 - 10.3 mg/dL 8.9 8.4(L) 8.1(L)       ASSESSMENT AND PLAN SYNOPSIS   Severe ACUTE Hypoxic and Hypercapnic Respiratory Failure COVID 19 pneumonitis and severe COPD exacerbation High risk for intubation Place on biPAP now  ACUTE KIDNEY INJURY/Renal Failure -follow chem  7 -follow UO -continue Foley Catheter-assess need -Avoid nephrotoxic agents -Recheck creatinine   ACUTE SYSTOLIC CARDIAC FAILURE- EF 25% -oxygen as needed -Lasix as tolerated   CARDIAC ICU monitoring  ID -continue IV abx as prescibed -follow up cultures  GI GI PROPHYLAXIS as indicated  NUTRITIONAL STATUS DIET-->NPO Constipation protocol as indicated  ENDO - will use ICU hypoglycemic\Hyperglycemia protocol if indicated   ELECTROLYTES -follow labs as needed -replace as needed -pharmacy consultation and following   DVT/GI PRX ordered TRANSFUSIONS AS NEEDED MONITOR FSBS ASSESS the need for LABS as needed   Critical Care Time devoted to patient care services described in this note is 36 minutes.   Overall, patient is critically ill, prognosis is guarded.  Patient with Multiorgan failure and at high risk for cardiac arrest and death.    Corrin Parker, M.D.  [facility]  Medical Director [place] Director [facility] Cardio-Pulmonary Department

## 2019-07-21 NOTE — Consult Note (Signed)
[facility] CONSULT NOTE    Date: 07/21/2019                  Patient Name:  Andrea [name]  MRN: [id number]  DOB: 1945/11/17  Age / Sex: 74 y.o., female         PCP: Perrin Maltese, MD                 Service Requesting Consult: Critical Care                 Reason for Consult: Acute kidney injury            History of Present Illness: Patient is a 74 y.o. female with a PMHx of hypertension, myocardial infarction, CABG and MRA of 2020, diabetes mellitus type 2, COPD on chronic home oxygen, chronic diastolic heart failure, chronic kidney disease stage III baseline creatinine 1.6-1.9, COVID-19 infection 05/15/2019, who was admitted to [facility] on 07/12/2019 for evaluation of increasing shortness of breath, cough, fatigue.  He is unable to offer any history at this point time as she is currently intubated and sedated.  We are asked to see her for worsening acute kidney injury.  Urine output was only 635 cc over the preceding 24 hours.  Creatinine up to 2.19 with a BUN of 63.  Case was discussed with pulmonary critical care and temporary dialysis catheter has been placed.  Patient currently requiring mechanical ventilation.   Medications: Outpatient medications: Medications Prior to Admission  Medication Sig Dispense Refill Last Dose  . albuterol (PROVENTIL HFA;VENTOLIN HFA) 108 (90 Base) MCG/ACT inhaler Inhale 2 puffs into the lungs every 6 (six) hours as needed for wheezing or shortness of breath. 1 Inhaler 0 Unknown at PRN  . allopurinol (ZYLOPRIM) 100 MG tablet Take 100 mg by mouth daily.     Marland Kitchen amLODipine (NORVASC) 5 MG tablet Take 5 mg by mouth daily.    24+ hours at Unknown  . apixaban (ELIQUIS) 5 MG TABS tablet Take 1 tablet (5 mg total) by mouth 2 (two) times daily. 180 tablet 1 24+ hours at Unknown  . aspirin EC 81 MG tablet Take 81 mg by mouth daily.     Marland Kitchen atorvastatin (LIPITOR) 40 MG tablet Take 40 mg by mouth at bedtime.  3 24+ hours at Unknown  .  budesonide-formoterol (SYMBICORT) 160-4.5 MCG/ACT inhaler Inhale 2 puffs into the lungs 2 (two) times daily.    24+ hours at Unknown  . Cholecalciferol (VITAMIN D3) 1000 units CAPS Take 1,000 Units by mouth daily.      Marland Kitchen ezetimibe (ZETIA) 10 MG tablet Take 10 mg by mouth daily.    24+ hours at Unknown  . fluticasone (FLONASE) 50 MCG/ACT nasal spray Place 1 spray into both nostrils daily as needed for allergies.    Unknown at PRN  . furosemide (LASIX) 20 MG tablet Take 40 mg by mouth daily.    24+ hours at Unknown  . glipiZIDE (GLUCOTROL) 10 MG tablet Take 10 mg by mouth 2 (two) times daily before a meal.    24+ hours at Unknown  . JANUVIA 50 MG tablet Take 50 mg by mouth daily.    24+ hours at Unknown  . metoprolol succinate (TOPROL-XL) 50 MG 24 hr tablet Take 50 mg by mouth daily. Take with or immediately following a meal.   24+ hours at Unknown  . Multiple Vitamin (MULTI-VITAMINS) TABS Take 1 tablet by mouth daily.     . nitroGLYCERIN (  NITROSTAT) 0.4 MG SL tablet Place 0.4 mg under the tongue every 5 (five) minutes as needed for chest pain.    Unknown at PRN  . omeprazole (PRILOSEC) 20 MG capsule Take 20 mg by mouth daily.    24+ hours at Unknown  . tiotropium (SPIRIVA HANDIHALER) 18 MCG inhalation capsule Place 18 mcg into inhaler and inhale daily.    24+ hours at Unknown  . metoprolol succinate (TOPROL-XL) 25 MG 24 hr tablet Take 1 tablet (25 mg total) by mouth daily. (Patient not taking: Reported on 07/20/2019) 30 tablet 0 Not Taking at Unknown time    Current medications: Current Facility-Administered Medications  Medication Dose Route Frequency Provider Last Rate Last Admin  . 0.9 %  sodium chloride infusion  250 mL Intravenous Continuous Awilda Bill, NP 10 mL/hr at 07/20/19 2045 Rate Change at 07/20/19 2045  . albuterol (PROVENTIL) (2.5 MG/3ML) 0.083% nebulizer solution 2.5 mg  2.5 mg Nebulization Q4H PRN Wouk, Ailene Rud, MD      . amiodarone (NEXTERONE PREMIX) 360-4.14 MG/200ML-%  (1.8 mg/mL) IV infusion  60 mg/hr Intravenous Continuous Flora Lipps, MD      . amiodarone (NEXTERONE PREMIX) 360-4.14 MG/200ML-% (1.8 mg/mL) IV infusion  30 mg/hr Intravenous Continuous Kasa, Kurian, MD      . Ampicillin-Sulbactam (UNASYN) 3 g in sodium chloride 0.9 % 100 mL IVPB  3 g Intravenous BID Flora Lipps, MD 200 mL/hr at 07/21/19 1204 3 g at 07/21/19 1204  . atorvastatin (LIPITOR) tablet 40 mg  40 mg Per Tube q1800 Awilda Bill, NP   40 mg at 07/21/19 1712  . chlorhexidine gluconate (MEDLINE KIT) (PERIDEX) 0.12 % solution 15 mL  15 mL Mouth Rinse BID Ottie Glazier, MD   15 mL at 07/20/19 2027  . Chlorhexidine Gluconate Cloth 2 % PADS 6 each  6 each Topical Daily Ottie Glazier, MD   6 each at 07/20/19 0010  . ezetimibe (ZETIA) tablet 10 mg  10 mg Per Tube Daily Awilda Bill, NP   10 mg at 07/21/19 1210  . famotidine (PEPCID) tablet 20 mg  20 mg Per Tube QHS Charlett Nose, RPH      . fentaNYL (SUBLIMAZE) injection 25 mcg  25 mcg Intravenous Q15 min PRN Flora Lipps, MD      . fentaNYL (SUBLIMAZE) injection 25-100 mcg  25-100 mcg Intravenous Q30 min PRN Flora Lipps, MD      . heparin ADULT infusion 100 units/mL (25000 units/21m sodium chloride 0.45%)  900 Units/hr Intravenous Continuous MGerald Dexter RPH 9 mL/hr at 07/21/19 1713 900 Units/hr at 07/21/19 1713  . insulin aspart (novoLOG) injection 0-6 Units  0-6 Units Subcutaneous Q4H KFlora Lipps MD   1 Units at 07/21/19 1718  . insulin glargine (LANTUS) injection 10 Units  10 Units Subcutaneous BID BAwilda Bill NP   10 Units at 07/20/19 2215  . ipratropium-albuterol (DUONEB) 0.5-2.5 (3) MG/3ML nebulizer solution 3 mL  3 mL Nebulization Q6H Wouk, NAilene Rud MD   3 mL at 07/21/19 1608  . MEDLINE mouth rinse  15 mL Mouth Rinse 10 times per day AOttie Glazier MD   15 mL at 07/21/19 1715  . methylPREDNISolone sodium succinate (SOLU-MEDROL) 40 mg/mL injection 40 mg  40 mg Intravenous Q24H AOttie Glazier MD    40 mg at 07/21/19 1158  . midazolam (VERSED) injection 1 mg  1 mg Intravenous Q15 min PRN KFlora Lipps MD      . midazolam (VERSED) injection 1 mg  1 mg Intravenous Q2H PRN Flora Lipps, MD      . midodrine (PROAMATINE) tablet 10 mg  10 mg Oral TID WC Ottie Glazier, MD   10 mg at 07/21/19 1211  . morphine 2 MG/ML injection 1-2 mg  1-2 mg Intravenous Q4H PRN Awilda Bill, NP   2 mg at 07/21/19 1306  . phenol (CHLORASEPTIC) mouth spray 1 spray  1 spray Mouth/Throat PRN Ottie Glazier, MD      . senna-docusate (Senokot-S) tablet 2 tablet  2 tablet Per Tube BID Flora Lipps, MD          Allergies: Allergies  Allergen Reactions  . Metformin And Related Diarrhea  . Sulfur Hives      Past Medical History: Past Medical History:  Diagnosis Date  . Arrhythmia    atrial fibrillation  . CHF (congestive heart failure) ([place])   . COPD (chronic obstructive pulmonary disease) ([place])   . Diabetes mellitus without complication ([place])   . Heart attack ([place])   . Hypertension   . Renal disorder      Past Surgical History: Past Surgical History:  Procedure Laterality Date  . ABDOMINAL HYSTERECTOMY    . CARDIAC SURGERY     stents     Family History: History reviewed. No pertinent family history.   Social History: Social History   Socioeconomic History  . Marital status: Divorced    Spouse name: Not on file  . Number of children: Not on file  . Years of education: Not on file  . Highest education level: Not on file  Occupational History  . Not on file  Tobacco Use  . Smoking status: Never Smoker  . Smokeless tobacco: Never Used  Substance and Sexual Activity  . Alcohol use: No  . Drug use: No  . Sexual activity: Not on file  Other Topics Concern  . Not on file  Social History Narrative  . Not on file   Social Determinants of Health   Financial Resource Strain:   . Difficulty of Paying Living Expenses: Not on file  Food Insecurity:   . Worried About Sales executive in the Last Year: Not on file  . Ran Out of Food in the Last Year: Not on file  Transportation Needs:   . Lack of Transportation (Medical): Not on file  . Lack of Transportation (Non-Medical): Not on file  Physical Activity:   . Days of Exercise per Week: Not on file  . Minutes of Exercise per Session: Not on file  Stress:   . Feeling of Stress : Not on file  Social Connections:   . Frequency of Communication with Friends and Family: Not on file  . Frequency of Social Gatherings with Friends and Family: Not on file  . Attends Religious Services: Not on file  . Active Member of Clubs or Organizations: Not on file  . Attends Archivist Meetings: Not on file  . Marital Status: Not on file  Intimate Partner Violence:   . Fear of Current or Ex-Partner: Not on file  . Emotionally Abused: Not on file  . Physically Abused: Not on file  . Sexually Abused: Not on file     Review of Systems: Unable to obtain as the patient is intubated.    Vital Signs: Blood pressure 133/66, pulse (!) 125, temperature 98.2 F (36.8 C), temperature source Axillary, resp. rate 14, height '5\' 2"'$  (1.575 m), weight 63.5 kg, SpO2 93 %.  Weight trends: [employer]  07/26/2019 2134  Weight: 63.5 kg    Physical Exam: General: Critically ill-appearing  Head: Normocephalic, atraumatic, endotracheal tube in place  Eyes: Anicteric  Nose: Mucous membranes moist, not inflammed, nonerythematous.  Throat: Endotracheal tube in place  Neck: Supple, trachea midline.  Lungs:  Scattered rhonchi and rales, vent assisted  Heart: S1S2 tachycardic  Abdomen:  BS normoactive. Soft, Nondistended, non-tender.  No masses or organomegaly.  Extremities: No pretibial edema.  Neurologic: Intubated, sedated  Skin: No visible rashes, scars.    Lab results: Basic Metabolic Panel: Recent Labs  Lab 07/19/19 0305 07/19/19 0305 07/20/19 0500 07/21/19 0446 07/21/19 0930  NA 132*  --  134* 139  --   K 5.4*   <  > 4.1 6.0* 4.6  CL 97*  --  100 105  --   CO2 23  --  21* 24  --   GLUCOSE 426*  --  185* 164*  --   BUN 43*  --  55* 63*  --   CREATININE 1.82*  --  2.00* 2.19*  --   CALCIUM 8.1*  --  8.4* 8.9  --   MG  --   --   --  1.7  --   PHOS  --   --   --  4.9*  --    < > = values in this interval not displayed.    Liver Function Tests: Recent Labs  Lab 07/19/19 0305 07/20/19 0500 07/21/19 0446  AST 127* 107* 63*  ALT 82* 116* 94*  ALKPHOS 122 99 91  BILITOT 1.1 0.7 0.6  PROT 6.2* 5.9* 5.7*  ALBUMIN 1.9* 2.0* 2.3*   No results for input(s): LIPASE, AMYLASE in the last 168 hours. No results for input(s): AMMONIA in the last 168 hours.  CBC: Recent Labs  Lab 07/11/2019 2139 07/04/2019 2139 07/20/19 0500 07/21/19 0446 07/21/19 0930  WBC 13.7*  --  10.8* 11.8*  --   NEUTROABS 8.2*  --   --  10.0*  --   HGB 8.4*   < > 7.3* 6.9* 7.3*  HCT 27.7*   < > 23.5* 22.9* 23.7*  MCV 92.0  --  89.0 90.5  --   PLT 447*  --  384 349  --    < > = values in this interval not displayed.    Cardiac Enzymes: No results for input(s): CKTOTAL, CKMB, CKMBINDEX, TROPONINI in the last 168 hours.  BNP: Invalid input(s): POCBNP  CBG: Recent Labs  Lab 07/21/19 0339 07/21/19 0725 07/21/19 1138 07/21/19 1212 07/21/19 1612  GLUCAP 151* 161* 51* 162* 156*    Microbiology: Results for orders placed or performed during the hospital encounter of 07/21/2019  Culture, blood (Routine x 2)     Status: None (Preliminary result)   Collection Time: 07/15/2019  9:40 PM   Specimen: BLOOD  Result Value Ref Range Status   Specimen Description BLOOD RIGHT ANTECUBITAL  Final   Special Requests   Final    BOTTLES DRAWN AEROBIC AND ANAEROBIC Blood Culture adequate volume   Culture   Final    NO GROWTH 3 DAYS Performed at [facility], [address] [place], [place] [address]    Report Status PENDING  Incomplete  Culture, blood (Routine x 2)     Status: None (Preliminary result)   Collection Time:  07/22/2019  9:40 PM   Specimen: BLOOD  Result Value Ref Range Status   Specimen Description BLOOD LEFT ANTECUBITAL  Final   Special Requests  Final    BOTTLES DRAWN AEROBIC AND ANAEROBIC Blood Culture adequate volume   Culture   Final    NO GROWTH 3 DAYS Performed at [facility], [address] [place], [place] [address]    Report Status PENDING  Incomplete  MRSA PCR Screening     Status: None   Collection Time: 07/19/19  6:45 AM   Specimen: Nasal Mucosa; Nasopharyngeal  Result Value Ref Range Status   MRSA by PCR NEGATIVE NEGATIVE Final    Comment:        The GeneXpert MRSA Assay (FDA approved for NASAL specimens only), is one component of a comprehensive MRSA colonization surveillance program. It is not intended to diagnose MRSA infection nor to guide or monitor treatment for MRSA infections. Performed at [facility], [address] [place], [place] [address]   Respiratory Panel by PCR     Status: None   Collection Time: 07/19/19 10:03 AM   Specimen: Nasopharyngeal Swab; Respiratory  Result Value Ref Range Status   Adenovirus NOT DETECTED NOT DETECTED Final   Coronavirus 229E NOT DETECTED NOT DETECTED Final    Comment: (NOTE) The Coronavirus on the Respiratory Panel, DOES NOT test for the novel  Coronavirus 2017/09/21 nCoV)    Coronavirus HKU1 NOT DETECTED NOT DETECTED Final   Coronavirus NL63 NOT DETECTED NOT DETECTED Final   Coronavirus OC43 NOT DETECTED NOT DETECTED Final   Metapneumovirus NOT DETECTED NOT DETECTED Final   Rhinovirus / Enterovirus NOT DETECTED NOT DETECTED Final   Influenza A NOT DETECTED NOT DETECTED Final   Influenza B NOT DETECTED NOT DETECTED Final   Parainfluenza Virus 1 NOT DETECTED NOT DETECTED Final   Parainfluenza Virus 2 NOT DETECTED NOT DETECTED Final   Parainfluenza Virus 3 NOT DETECTED NOT DETECTED Final   Parainfluenza Virus 4 NOT DETECTED NOT DETECTED Final   Respiratory Syncytial Virus NOT DETECTED NOT DETECTED  Final   Bordetella pertussis NOT DETECTED NOT DETECTED Final   Chlamydophila pneumoniae NOT DETECTED NOT DETECTED Final   Mycoplasma pneumoniae NOT DETECTED NOT DETECTED Final    Comment: Performed at [facility], [address], [place],  [address]    Coagulation Studies: Recent Labs    06/30/2019 2137-09-21 07/21/19 1246  LABPROT 20.7* 30.3*  INR 1.8* 2.9*    Urinalysis: Recent Labs    07/19/19 0114  COLORURINE YELLOW*  LABSPEC 1.011  PHURINE 5.0  GLUCOSEU NEGATIVE  HGBUR NEGATIVE  BILIRUBINUR NEGATIVE  KETONESUR NEGATIVE  PROTEINUR 30*  NITRITE NEGATIVE  LEUKOCYTESUR NEGATIVE      Imaging: CT HEAD WO CONTRAST  Result Date: 07/19/2019 CLINICAL DATA:  Tachypnea, neurological deficit EXAM: CT HEAD WITHOUT CONTRAST TECHNIQUE: Contiguous axial images were obtained from the base of the skull through the vertex without intravenous contrast. COMPARISON:  06/28/2016 FINDINGS: Brain: No evidence of acute infarction, hemorrhage, extra-axial collection, ventriculomegaly, or mass effect. Generalized cerebral atrophy. Periventricular white matter low attenuation likely secondary to microangiopathy. Vascular: Cerebrovascular atherosclerotic calcifications are noted. Skull: Negative for fracture or focal lesion. Sinuses/Orbits: Visualized portions of the orbits are unremarkable. Visualized portions of the paranasal sinuses are unremarkable. Visualized portions of the mastoid air cells are unremarkable. Other: None. IMPRESSION: No acute intracranial pathology. Electronically Signed   By: Kathreen Devoid   On: 07/19/2019 18:23   NM Pulmonary Perfusion  Result Date: 07/19/2019 CLINICAL DATA:  S respiratory failure, intubation, sepsis, respiratory distress with hypoxia, severe shortness of breath and cough, fatigue, history of CHF, COPD, diabetes mellitus, stage  III chronic kidney disease, was COVID positive in November 2020 EXAM: NUCLEAR MEDICINE PERFUSION LUNG SCAN TECHNIQUE: Perfusion  images were obtained in multiple projections after intravenous injection of radiopharmaceutical. Ventilation scan not performed, patient on ventilator. RADIOPHARMACEUTICALS:  4.16 mCi Tc-76mMAA IV COMPARISON:  None Correlation: Chest radiograph 07/19/2019 FINDINGS: Diminished perfusion at anterior segment of RIGHT middle lobe adjacent to minor fissure corresponding to more focal infiltrate on chest radiography. Generally diminished perfusion in LEFT lower lobe likely reflecting slight prominence of the cardiac silhouette, small pleural effusion, and infiltrates. Contour abnormalities of the posterior lower lobes from small pleural effusions. No other segmental or subsegmental perfusion defects identified. Pattern favors parenchymal lung disease rather than pulmonary embolism. No ventilation exam for correlation. IMPRESSION: Subsegmental perfusion defect in RIGHT upper lobe adjacent to minor fissure corresponding to focal infiltrate on chest radiograph. No other segmental or subsegmental perfusion defects are identified. In the absence of a ventilation exam, findings favor parenchymal lung disease and likely represent a low likelihood for pulmonary embolism. Electronically Signed   By: MLavonia DanaM.D.   On: 07/19/2019 18:21   DG Chest Port 1 View  Result Date: 07/21/2019 CLINICAL DATA:  Intubation, OG tube placement, and central line placement. EXAM: PORTABLE CHEST 1 VIEW COMPARISON:  07/19/2019 FINDINGS: An endotracheal tube terminates less than 1 cm above the carina, deeper than on the prior study and directed towards the right mainstem bronchus. An enteric tube terminates over the stomach. A right jugular catheter terminates over the high right atrium and is new from the prior study. Sequelae of CABG are again identified. The cardiomediastinal silhouette is unchanged. There are persistent interstitial densities throughout both lungs. Patchy airspace opacities persist in the mid lungs and have increased in  the lung bases with enlarging small bilateral pleural effusions. No pneumothorax is identified. IMPRESSION: 1. Endotracheal tube now terminates less than 1 cm above the carina near the right mainstem bronchus orifice. Recommend retracting 2-3 cm. 2. Bilateral interstitial and airspace opacities with worsening aeration of the lung bases which may reflect edema or pneumonia along with enlarging pleural effusions. Electronically Signed   By: ALogan BoresM.D.   On: 07/21/2019 17:38   ECHOCARDIOGRAM COMPLETE  Result Date: 07/20/2019   ECHOCARDIOGRAM REPORT   Patient Name:   Andrea BIRDSELLDate of Exam: 07/20/2019 Medical Rec #:  [id number]       Height:       62.0 in Accession #:    [id number]      Weight:       140.0 lb Date of Birth:  310/11/1945       BSA:          1.64 m Patient Age:    89years         BP:           120/46 mmHg Patient Gender: F                HR:           91 bpm. Exam Location:  [facility] Procedure: 2D Echo, Cardiac Doppler and Color Doppler Indications:     CHF 428.21  History:         Patient has prior history of Echocardiogram examinations. CHF,                  COPD; Risk Factors:Hypertension.  Sonographer:     CAlyse LowRoar Referring Phys:  [id number] GlazierDiagnosing Phys: TIda RogueMD  IMPRESSIONS  1. Left ventricular ejection fraction, by visual estimation, is 25 to 30%. The left ventricle has severely decreased function. There is no left ventricular hypertrophy.  2. Left ventricular diastolic parameters are consistent with Grade II diastolic dysfunction (pseudonormalization).  3. Mild to moderately dilated left ventricular internal cavity size.  4. The left ventricle demonstrates global hypokinesis, severe hypokinesis of the anterior and anteroseptal wall.  5. Global right ventricle has low normal systolic function.The right ventricular size is normal. No increase in right ventricular wall thickness.  6. Left atrial size was mildly dilated.  7. Mildly elevated pulmonary artery  systolic pressure.  8. Pleural effusion noted on the left, estimated at 5 cm FINDINGS  Left Ventricle: Left ventricular ejection fraction, by visual estimation, is 25 to 30%. The left ventricle has severely decreased function. The left ventricle demonstrates global hypokinesis. The left ventricular internal cavity size was mildly to moderately dilated left ventricle. There is no left ventricular hypertrophy. Left ventricular diastolic parameters are consistent with Grade II diastolic dysfunction (pseudonormalization). Normal left atrial pressure. Right Ventricle: The right ventricular size is normal. No increase in right ventricular wall thickness. Global RV systolic function is has low normal systolic function. The tricuspid regurgitant velocity is 2.57 m/s, and with an assumed right atrial pressure of 10 mmHg, the estimated right ventricular systolic pressure is mildly elevated at 36.4 mmHg. Left Atrium: Left atrial size was mildly dilated. Right Atrium: Right atrial size was normal in size Pericardium: There is no evidence of pericardial effusion. Mitral Valve: The mitral valve is normal in structure. Mild mitral valve regurgitation. No evidence of mitral valve stenosis by observation. Tricuspid Valve: The tricuspid valve is normal in structure. Tricuspid valve regurgitation is mild-moderate. Aortic Valve: The aortic valve was not well visualized. Aortic valve regurgitation is not visualized. The aortic valve is structurally normal, with no evidence of sclerosis or stenosis. Aortic valve mean gradient measures 4.0 mmHg. Aortic valve peak gradient measures 6.5 mmHg. Aortic valve area, by VTI measures 1.86 cm. Pulmonic Valve: The pulmonic valve was normal in structure. Pulmonic valve regurgitation is not visualized. Pulmonic regurgitation is not visualized. Aorta: The aortic root, ascending aorta and aortic arch are all structurally normal, with no evidence of dilitation or obstruction. Venous: The inferior vena  cava is normal in size with greater than 50% respiratory variability, suggesting right atrial pressure of 3 mmHg. IAS/Shunts: No atrial level shunt detected by color flow Doppler. There is no evidence of a patent foramen ovale. No ventricular septal defect is seen or detected. There is no evidence of an atrial septal defect.  LEFT VENTRICLE PLAX 2D LVIDd:         4.89 cm       Diastology LVIDs:         4.22 cm       LV e' lateral:   9.16 cm/s LV PW:         1.05 cm       LV E/e' lateral: 11.7 LV IVS:        0.93 cm       LV e' medial:    4.38 cm/s LVOT diam:     1.70 cm       LV E/e' medial:  24.4 LV SV:         33 ml LV SV Index:   19.52 LVOT Area:     2.27 cm  LV Volumes (MOD) LV area d, A2C:    29.50 cm LV area d,  A4C:    28.30 cm LV area s, A2C:    23.30 cm LV area s, A4C:    23.00 cm LV major d, A2C:   6.78 cm LV major d, A4C:   6.69 cm LV major s, A2C:   6.21 cm LV major s, A4C:   6.51 cm LV vol d, MOD A2C: 103.0 ml LV vol d, MOD A4C: 98.0 ml LV vol s, MOD A2C: 71.8 ml LV vol s, MOD A4C: 67.0 ml LV SV MOD A2C:     31.2 ml LV SV MOD A4C:     98.0 ml LV SV MOD BP:      30.2 ml RIGHT VENTRICLE RV Mid diam:    3.15 cm TAPSE (M-mode): 1.2 cm LEFT ATRIUM             Index       RIGHT ATRIUM           Index LA diam:        4.30 cm 2.62 cm/m  RA Area:     10.70 cm LA Vol (A2C):   53.1 ml 32.32 ml/m RA Volume:   19.90 ml  12.11 ml/m LA Vol (A4C):   40.2 ml 24.47 ml/m LA Biplane Vol: 47.3 ml 28.79 ml/m  AORTIC VALVE                   PULMONIC VALVE AV Area (Vmax):    2.09 cm    PV Vmax:        1.09 m/s AV Area (Vmean):   1.96 cm    PV Peak grad:   4.8 mmHg AV Area (VTI):     1.86 cm    RVOT Peak grad: 2 mmHg AV Vmax:           127.00 cm/s AV Vmean:          94.300 cm/s AV VTI:            0.238 m AV Peak Grad:      6.5 mmHg AV Mean Grad:      4.0 mmHg LVOT Vmax:         117.00 cm/s LVOT Vmean:        81.300 cm/s LVOT VTI:          0.195 m LVOT/AV VTI ratio: 0.82  AORTA Ao Root diam: 2.70 cm MITRAL VALVE                         TRICUSPID VALVE MV Area (PHT): 5.62 cm             TR Peak grad:   26.4 mmHg MV PHT:        39.15 msec           TR Vmax:        257.00 cm/s MV Decel Time: 135 msec MV E velocity: 107.00 cm/s 103 cm/s SHUNTS                                     Systemic VTI:  0.20 m                                     Systemic Diam: 1.70 cm  Ida Rogue MD Electronically signed by Ida Rogue MD Signature Date/Time: 07/20/2019/1:28:48 PM  Final       Assessment & Plan: Pt is a 74 y.o. female with a PMHx of hypertension, myocardial infarction, CABG and MRA of 2020, diabetes mellitus type 2, COPD on chronic home oxygen, chronic diastolic heart failure, chronic kidney disease stage III baseline creatinine 1.6-1.9, COVID-19 infection 05/15/2019, who was [facility] to [facility] on 07/14/2019 for evaluation of increasing shortness of breath, cough, fatigue.   1.  Acute kidney injury likely secondary to acute tubular necrosis. 2.  Chronic kidney disease stage IIIb baseline creatinine 1.6-1.9. 3.  Acute respiratory failure. 4.  Anemia unspecified.  Plan: Patient has signs and symptoms of multiorgan failure now.  She has lactic acidosis as well.  In addition she is unstable from a cardiovascular perspective as she is tachycardic at the moment.  Therefore we recommend initiation of continuous renal replacement therapy.  We will place the patient on a 4K bath.  Continue to monitor serum electrolytes closely and avoid nephrotoxins as possible.  Further plan as patient progresses.

## 2019-07-21 NOTE — Procedures (Signed)
Central Venous Dailysis Catheter Placement: TRIPLE LUMEN  Indication: Hemo Dialysis/CRRT   Consent:emergent   Hand washing performed prior to starting the procedure.   Procedure: An active timeout was performed and correct patient, name, & ID confirmed. Patient was positioned correctly for central venous access. Patient was prepped using strict sterile technique including chlorohexadine preps, sterile drape, sterile gown and sterile gloves.  The area was prepped, draped and anesthetized in the usual sterile manner. Patient comfort was obtained.    A Double lumen catheter was placed in RT IJ  Vein There was good blood return, catheter caps were placed on lumens, catheter flushed easily, the line was secured and a sterile dressing and BIO-PATCH applied.   Ultrasound was used to visualize vasculature and guidance of needle.   Number of Attempts: 1 Complications:none  Estimated Blood Loss: none Operator: Clinton Wahlberg.   Viktoria Gruetzmacher David Donavyn Fecher, M.D.  [facility]  Medical Director ICU-[facility] [place] Medical Director [facility]io[facility] Department     

## 2019-07-21 NOTE — Consult Note (Signed)
Consultation Note Date: 07/21/2019   Patient Name: Andrea [name]  DOB: 26-Jun-1946  MRN: [id number]  Age / Sex: 74 y.o., female  PCP: Perrin Maltese, MD Referring Physician: Flora Lipps, MD  Reason for Consultation: Establishing goals of care  HPI/Patient Profile: 74 y.o. female  with past medical history of COPD, CHF, atrial fibrillation, CAD s/p CABG 2020, h/o MI, h/o PE, hypertension, CKD stage 3, diabetes, breast cancer in remission admitted on 07/03/2019 with shortness of breath. Hospitalization 3 times over Nov-Dec 2020 with COVID pneumonia. Reportedly had steady improvement until ~3 days prior to this admission when she became more short of breath and with dry cough and diagnosed with multifocal pneumonia. Acute decompensation and required intubation 1/23 and was extubated 1/24 but now with increased work of breathing and requiring BiPAP. Continues to be high risk for re-intubation with COVID pneumonitis and severe COPD exacerbation.   Clinical Assessment and Goals of Care: I met today at Andrea [name]'s bedside. She is lethargic on BiPAP and her grandson is at bedside. He inquires about renal consult and nebulizer treatment. Andrea [name] is unable to have a conversation with me at current time as she is too lethargic on BiPAP. Per grandson she has 3 daughters and daughter, Warren Lacy, is main point of contact as she is a Marine scientist.   I reached out to [name] who got her daughter Miquel Dunn on the phone as well. They have good understanding of the situation. They are very anxious to get dialysis started with hopes that removing fluid will prevent her from being re-intubated. I reiterated this concern to Dr. Mortimer Fries. I do fear that this is multifactorial with cardiac and pulmonary both culprits to her decline. I worry about her ability to tolerate dialysis with low EF 25-30% and her ability to improve to come off vent again if  re-intubated. Family understand th+ese concerns. They tell me that Andrea [name] communicated yesterday her desire to have all measures taken to keep her alive and that she feels that she will improve. While family understand that she may not improve they will respect her wishes. They also understand that depending on her status and expectation of improvement that these conversations may change even if she is unable to participate further in these decisions.   They have also relayed to me that she has 3 daughters: [name] (whom she lives with), Lattie Haw (who is flying here to visit and has not seen her in 2 yrs), and Olivia Mackie (last spoke they left on bad terms). I am working with them to try and get Olivia Mackie one time visitation approved so that Andrea [name] (and her daughter) will have more peace moving forward in case of further decline.   **In the time I was obtaining approval for daughter, Olivia Mackie, to visit Andrea [name] was re-intubated. When I called [name] she was very upset. [name] is upset because there is much left to be said for her mother to be at peace at the end of her life. I will work with them tomorrow  and see if we can ease off sedation in order for daughters to tell her what they need to say to try and give her the peace that she needs.   I will follow up tomorrow.   Primary Decision Maker NEXT OF KIN 3 daughters, point of contact is [name]    SUMMARY OF RECOMMENDATIONS   - Continue full aggressive care based on patient's last voiced wishes  Code Status/Advance Care Planning:  Full code   Symptom Management:   Per PCCM, cardiology, renal  Palliative Prophylaxis:   Aspiration, Oral Care and Turn Reposition  Additional Recommendations (Limitations, Scope, Preferences):  Full Scope Treatment  Psycho-social/Spiritual:   Desire for further Chaplaincy support:yes  Additional Recommendations: Grief/Bereavement Support  Prognosis:   Overall prognosis is very poor with multiorgan failure.    Discharge Planning: To Be Determined      Primary Diagnoses: Present on Admission: . Pneumonia . CKD (chronic kidney disease) stage 3, GFR 30-59 ml/min . Pulmonary embolism on right ([place]) . Type 2 diabetes mellitus with complication, without long-term current use of insulin ([place]) . Multifocal pneumonia   I have reviewed the medical record, interviewed the patient and family, and examined the patient. The following aspects are pertinent.  Past Medical History:  Diagnosis Date  . Arrhythmia    atrial fibrillation  . CHF (congestive heart failure) ([place])   . COPD (chronic obstructive pulmonary disease) ([place])   . Diabetes mellitus without complication ([place])   . Heart attack ([place])   . Hypertension   . Renal disorder    Social History   Socioeconomic History  . Marital status: Divorced    Spouse name: Not on file  . Number of children: Not on file  . Years of education: Not on file  . Highest education level: Not on file  Occupational History  . Not on file  Tobacco Use  . Smoking status: Never Smoker  . Smokeless tobacco: Never Used  Substance and Sexual Activity  . Alcohol use: No  . Drug use: No  . Sexual activity: Not on file  Other Topics Concern  . Not on file  Social History Narrative  . Not on file   Social Determinants of Health   Financial Resource Strain:   . Difficulty of Paying Living Expenses: Not on file  Food Insecurity:   . Worried About Charity fundraiser in the Last Year: Not on file  . Ran Out of Food in the Last Year: Not on file  Transportation Needs:   . Lack of Transportation (Medical): Not on file  . Lack of Transportation (Non-Medical): Not on file  Physical Activity:   . Days of Exercise per Week: Not on file  . Minutes of Exercise per Session: Not on file  Stress:   . Feeling of Stress : Not on file  Social Connections:   . Frequency of Communication with Friends and Family: Not on file  . Frequency of Social Gatherings with  Friends and Family: Not on file  . Attends Religious Services: Not on file  . Active Member of Clubs or Organizations: Not on file  . Attends Archivist Meetings: Not on file  . Marital Status: Not on file   History reviewed. No pertinent family history. Scheduled Meds: . atorvastatin  40 mg Per Tube q1800  . chlorhexidine gluconate (MEDLINE KIT)  15 mL Mouth Rinse BID  . Chlorhexidine Gluconate Cloth  6 each Topical Daily  . ezetimibe  10 mg Per Tube  Daily  . insulin aspart  0-20 Units Subcutaneous Q4H  . insulin glargine  10 Units Subcutaneous BID  . ipratropium-albuterol  3 mL Nebulization Q6H  . mouth rinse  15 mL Mouth Rinse 10 times per day  . methylPREDNISolone (SOLU-MEDROL) injection  40 mg Intravenous Q24H  . midodrine  10 mg Oral TID WC   Continuous Infusions: . sodium chloride 10 mL/hr at 07/20/19 2045  . ampicillin-sulbactam (UNASYN) IV 3 g (07/21/19 1204)  . famotidine (PEPCID) IV 20 mg (07/21/19 0636)  . heparin     PRN Meds:.albuterol, morphine injection, phenol Allergies  Allergen Reactions  . Metformin And Related Diarrhea  . Sulfur Hives   Review of Systems  Unable to perform ROS: Acuity of condition    Physical Exam Vitals and nursing note reviewed.  Constitutional:      Appearance: She is ill-appearing.  Cardiovascular:     Rate and Rhythm: Tachycardia present.  Pulmonary:     Effort: No tachypnea, accessory muscle usage or respiratory distress.     Comments: BiPAP dependent; appears fatigued and poor respiratory reserve Abdominal:     Palpations: Abdomen is soft.  Neurological:     Mental Status: She is lethargic.     Vital Signs: BP 120/74   Pulse (!) 131   Temp 98.2 F (36.8 C) (Axillary)   Resp (!) 34   Ht 5' 2"  (1.575 m)   Wt 63.5 kg   SpO2 97%   BMI 25.61 kg/m  Pain Scale: 0-10 POSS *See Group Information*: 2-Acceptable,Slightly drowsy, easily aroused Pain Score: 0-No pain   SpO2: SpO2: 97 % O2 Device:SpO2: 97  % O2 Flow Rate: .O2 Flow Rate (L/min): 4 L/min  IO: Intake/output summary:   Intake/Output Summary (Last 24 hours) at 07/21/2019 1420 Last data filed at 07/21/2019 0400 Gross per 24 hour  Intake 478.11 ml  Output 450 ml  Net 28.11 ml    LBM: Last BM Date: (UTA) Baseline Weight: Weight: 63.5 kg Most recent weight: Weight: 63.5 kg     Palliative Assessment/Data:     Time In: 1530 Time Out: 1645 Time Total: 75 min Greater than 50%  of this time was spent counseling and coordinating care related to the above assessment and plan.  Signed by: Vinie Sill, NP Palliative Medicine Team Pager # [phone] (M-F 8a-5p) Team Phone # [phone] (Nights/Weekends)

## 2019-07-21 NOTE — Procedures (Signed)
Endotracheal Intubation: Patient required placement of an artificial airway secondary to Respiratory Failure  Consent: Emergent.   Hand washing performed prior to starting the procedure.   Medications administered for sedation prior to procedure:  Midazolam 4 mg IV,  Vecuronium 10 mg IV, Fentanyl 100 mcg IV.    A time out procedure was called and correct patient, name, & ID confirmed. Needed supplies and equipment were assembled and checked to include ETT, 10 ml syringe, Glidescope, Mac and Miller blades, suction, oxygen and bag mask valve, end tidal CO2 monitor.   Patient was positioned to align the mouth and pharynx to facilitate visualization of the glottis.   Heart rate, SpO2 and blood pressure was continuously monitored during the procedure. Pre-oxygenation was conducted prior to intubation and endotracheal tube was placed through the vocal cords into the trachea.     The artificial airway was placed under direct visualization via glidescope route using a 8.0 ETT on the first attempt.  ETT was secured at 23 cm mark.  Placement was confirmed by auscuitation of lungs with good breath sounds bilaterally and no stomach sounds.  Condensation was noted on endotracheal tube.   Pulse ox 98%.  CO2 detector in place with appropriate color change.   Complications: None .   Operator: Margie Urbanowicz.   Chest radiograph ordered and pending.   Comments: OGT placed via glidescope.  Jared Whorley David Raliyah Montella, M.D.  [facility]  Medical Director [facility] [place] Medical Director [facility]       

## 2019-07-21 NOTE — Progress Notes (Signed)
Pharmacy Antibiotic Note  Andrea [name] is a 74 y.o. female admitted on 07/01/2019 with acute on chronic renal failure with hyperkalemia, elevated troponin, and acute on chronic hypoxic hypercapnic respiratory failure secondary to bacterial pneumonia vs COVID-19 pneumonitis. Pt has been extubated 01/24. Pt was previously on levofloxacin. Pharmacy has been consulted for Unasyn dosing.  Plan: D/C levofloxacin 750mg  IV Q48H due to co-morbdities and side effect profiles.  Initiate Unasyn 3g IV BID for CAP today. Pt has a CrCl of 20.3 mL/min. Dosing is BID to adjust for renal function.  Total duration of antibiotics therapy so far: 3 days  Treatment total duration is for 7 days  Height: 5\' 2"  (157.5 cm) Weight: 140 lb (63.5 kg) IBW/kg (Calculated) : 50.1  Temp (24hrs), Avg:98 F (36.7 C), Min:97.8 F (36.6 C), Max:98.6 F (37 C)  Recent Labs  Lab 07/08/2019 2139 06/30/2019 2343 07/19/19 0305 07/20/19 0500 07/21/19 0446  WBC 13.7*  --   --  10.8* 11.8*  CREATININE 1.78*  --  1.82* 2.00* 2.19*  LATICACIDVEN 6.2* 2.8*  --   --   --     Estimated Creatinine Clearance: 20.3 mL/min (A) (by C-G formula based on SCr of 2.19 mg/dL (H)).    Allergies  Allergen Reactions  . Metformin And Related Diarrhea  . Sulfur Hives    Antimicrobials this admission: Cefepime 2g IV  01/22 >> 01/22 Vancomycin 1g IV 01/22 >> 01/22 Levofloxacin 750mg  IV 01/23 >> 01/25 Vancomycin 500mg  IV 01/23 >> 01/23  Unasyn 3g IV  01/25 >> 01/29  Dose adjustments this admission: NA  Microbiology results: 01/22 BCx: No growth 3 days 01/22 BCx: No growth 3 days 01/23 Respiratory Culture by PCR (Nasopharyngeal Swab): No detected species  01/23 MRSA PCR: Negative  Thank you for allowing pharmacy to be a part of this patient's care.  Roanna Banning 07/21/2019 1:43 PM

## 2019-07-21 NOTE — Consult Note (Signed)
MEDICATION RELATED CONSULT NOTE - FOLLOW UP   Pharmacy Consult for   Review Meds DAILY for adjustment due to CRRT  Allergies  Allergen Reactions  . Metformin And Related Diarrhea  . Sulfur Hives    Patient Measurements: Height: 5\' 2"  (157.5 cm) Weight: 140 lb (63.5 kg) IBW/kg (Calculated) : 50.1  Vital Signs: Temp: 98.2 F (36.8 C) (01/25 1600) Temp Source: Axillary (01/25 1600) BP: 133/66 (01/25 1700) Pulse Rate: 125 (01/25 1700) Intake/Output from previous day: 01/24 0701 - 01/25 0700 In: 478.1 [I.V.:378.2; IV Piggyback:99.9] Out: 635 [Urine:635] Intake/Output from this shift: Total I/O In: -  Out: 150 [Urine:150]  Labs: Recent Labs    07/19/2019 2139 06/30/2019 2139 07/19/19 0305 07/20/19 0500 07/21/19 0446 07/21/19 0930  WBC 13.7*  --   --  10.8* 11.8*  --   HGB 8.4*   < >  --  7.3* 6.9* 7.3*  HCT 27.7*   < >  --  23.5* 22.9* 23.7*  PLT 447*  --   --  384 349  --   CREATININE 1.78*   < > 1.82* 2.00* 2.19*  --   MG  --   --   --   --  1.7  --   PHOS  --   --   --   --  4.9*  --   ALBUMIN 2.3*   < > 1.9* 2.0* 2.3*  --   PROT 6.8   < > 6.2* 5.9* 5.7*  --   AST 34   < > 127* 107* 63*  --   ALT 25   < > 82* 116* 94*  --   ALKPHOS 93   < > 122 99 91  --   BILITOT 0.7   < > 1.1 0.7 0.6  --    < > = values in this interval not displayed.   Estimated Creatinine Clearance: 20.3 mL/min (A) (by C-G formula based on SCr of 2.19 mg/dL (H)).   Microbiology: Recent Results (from the past 720 hour(s))  Blood culture (routine x 2)     Status: None   Collection Time: 06/22/19 11:12 PM   Specimen: BLOOD  Result Value Ref Range Status   Specimen Description BLOOD PERIPHERAL IV  Final   Special Requests   Final    BOTTLES DRAWN AEROBIC AND ANAEROBIC Blood Culture adequate volume   Culture   Final    NO GROWTH 5 DAYS Performed at [facility], [address] [place], [place] [address]    Report Status 06/27/2019 FINAL  Final  Blood culture (routine x 2)      Status: None   Collection Time: 06/22/19 11:24 PM   Specimen: BLOOD  Result Value Ref Range Status   Specimen Description BLOOD LEFT ARM  Final   Special Requests   Final    BOTTLES DRAWN AEROBIC AND ANAEROBIC Blood Culture adequate volume   Culture   Final    NO GROWTH 5 DAYS Performed at [facility], [address] [place], [place] [address]    Report Status 06/27/2019 FINAL  Final  Respiratory Panel by PCR     Status: None   Collection Time: 06/23/19  7:46 AM   Specimen: Nasopharyngeal Swab; Respiratory  Result Value Ref Range Status   Adenovirus NOT DETECTED NOT DETECTED Final   Coronavirus 229E NOT DETECTED NOT DETECTED Final    Comment: (NOTE) The Coronavirus on the Respiratory Panel, DOES NOT test for the novel  Coronavirus (2019 nCoV)  Coronavirus HKU1 NOT DETECTED NOT DETECTED Final   Coronavirus NL63 NOT DETECTED NOT DETECTED Final   Coronavirus OC43 NOT DETECTED NOT DETECTED Final   Metapneumovirus NOT DETECTED NOT DETECTED Final   Rhinovirus / Enterovirus NOT DETECTED NOT DETECTED Final   Influenza A NOT DETECTED NOT DETECTED Final   Influenza B NOT DETECTED NOT DETECTED Final   Parainfluenza Virus 1 NOT DETECTED NOT DETECTED Final   Parainfluenza Virus 2 NOT DETECTED NOT DETECTED Final   Parainfluenza Virus 3 NOT DETECTED NOT DETECTED Final   Parainfluenza Virus 4 NOT DETECTED NOT DETECTED Final   Respiratory Syncytial Virus NOT DETECTED NOT DETECTED Final   Bordetella pertussis NOT DETECTED NOT DETECTED Final   Chlamydophila pneumoniae NOT DETECTED NOT DETECTED Final   Mycoplasma pneumoniae NOT DETECTED NOT DETECTED Final    Comment: Performed at [facility], [place] [address] [place], [place] [address]  SARS CORONAVIRUS 2 (TAT 6-24 HRS) Nasopharyngeal Nasopharyngeal Swab     Status: Abnormal   Collection Time: 06/23/19  2:39 PM   Specimen: Nasopharyngeal Swab  Result Value Ref Range Status   SARS Coronavirus 2 POSITIVE (A) NEGATIVE Final     Comment: RESULT CALLED TO, READ BACK BY AND VERIFIED WITH: E.LOUISSAINT RN 2221 06/23/2019 MCCORMICK K (NOTE) SARS-CoV-2 target nucleic acids are DETECTED. The SARS-CoV-2 RNA is generally detectable in upper and lower respiratory specimens during the acute phase of infection. Positive results are indicative of the presence of SARS-CoV-2 RNA. Clinical correlation with patient history and other diagnostic information is  necessary to determine patient infection status. Positive results do not rule out bacterial infection or co-infection with other viruses.  The expected result is Negative. Fact Sheet for Patients: SugarRoll.be Fact Sheet for Healthcare Providers: https://www.woods-mathews.com/ This test is not yet approved or cleared by the [place] FDA and  has been authorized for detection and/or diagnosis of SARS-CoV-2 by FDA under an Emergency Use Authorization (EUA). This EUA will remain  in effect (meaning this test can be used)  for the duration of the COVID-19 declaration under Section 564(b)(1) of the Act, 21 U.S.C. section 360bbb-3(b)(1), unless the authorization is terminated or revoked sooner. Performed at [facility], [place] [address] [place], [place] [address]   Culture, blood (Routine x 2)     Status: None (Preliminary result)   Collection Time: 07/23/2019  9:40 PM   Specimen: BLOOD  Result Value Ref Range Status   Specimen Description BLOOD RIGHT ANTECUBITAL  Final   Special Requests   Final    BOTTLES DRAWN AEROBIC AND ANAEROBIC Blood Culture adequate volume   Culture   Final    NO GROWTH 3 DAYS Performed at [facility], [address], [place], [place] [address]    Report Status PENDING  Incomplete  Culture, blood (Routine x 2)     Status: None (Preliminary result)   Collection Time: 07/07/2019  9:40 PM   Specimen: BLOOD  Result Value Ref Range Status   Specimen Description BLOOD LEFT ANTECUBITAL   Final   Special Requests   Final    BOTTLES DRAWN AEROBIC AND ANAEROBIC Blood Culture adequate volume   Culture   Final    NO GROWTH 3 DAYS Performed at [facility], [address], [place], [place] [address]    Report Status PENDING  Incomplete  MRSA PCR Screening     Status: None   Collection Time: 07/19/19  6:45 AM   Specimen: Nasal Mucosa; Nasopharyngeal  Result Value  Ref Range Status   MRSA by PCR NEGATIVE NEGATIVE Final    Comment:        The GeneXpert MRSA Assay (FDA approved for NASAL specimens only), is one component of a comprehensive MRSA colonization surveillance program. It is not intended to diagnose MRSA infection nor to guide or monitor treatment for MRSA infections. Performed at [facility], [address] [place], [place] [address]   Respiratory Panel by PCR     Status: None   Collection Time: 07/19/19 10:03 AM   Specimen: Nasopharyngeal Swab; Respiratory  Result Value Ref Range Status   Adenovirus NOT DETECTED NOT DETECTED Final   Coronavirus 229E NOT DETECTED NOT DETECTED Final    Comment: (NOTE) The Coronavirus on the Respiratory Panel, DOES NOT test for the novel  Coronavirus (2019 nCoV)    Coronavirus HKU1 NOT DETECTED NOT DETECTED Final   Coronavirus NL63 NOT DETECTED NOT DETECTED Final   Coronavirus OC43 NOT DETECTED NOT DETECTED Final   Metapneumovirus NOT DETECTED NOT DETECTED Final   Rhinovirus / Enterovirus NOT DETECTED NOT DETECTED Final   Influenza A NOT DETECTED NOT DETECTED Final   Influenza B NOT DETECTED NOT DETECTED Final   Parainfluenza Virus 1 NOT DETECTED NOT DETECTED Final   Parainfluenza Virus 2 NOT DETECTED NOT DETECTED Final   Parainfluenza Virus 3 NOT DETECTED NOT DETECTED Final   Parainfluenza Virus 4 NOT DETECTED NOT DETECTED Final   Respiratory Syncytial Virus NOT DETECTED NOT DETECTED Final   Bordetella pertussis NOT DETECTED NOT DETECTED Final   Chlamydophila pneumoniae NOT DETECTED NOT DETECTED  Final   Mycoplasma pneumoniae NOT DETECTED NOT DETECTED Final    Comment: Performed at [facility], [address] [place], [place] [address]    Assessment/Plan:  Reviewed pt active medication list for dosage adjustments warranted due to CRRT.    No adjustments recommended at this time.  Lu Duffel, PharmD, BCPS Clinical Pharmacist 07/21/2019 6:44 PM

## 2019-07-22 ENCOUNTER — Inpatient Hospital Stay: Payer: Medicare Other

## 2019-07-22 DIAGNOSIS — J441 Chronic obstructive pulmonary disease with (acute) exacerbation: Secondary | ICD-10-CM

## 2019-07-22 DIAGNOSIS — U071 COVID-19: Secondary | ICD-10-CM

## 2019-07-22 LAB — RENAL FUNCTION PANEL
Albumin: 2.4 g/dL — ABNORMAL LOW (ref 3.5–5.0)
Albumin: 2.5 g/dL — ABNORMAL LOW (ref 3.5–5.0)
Anion gap: 12 (ref 5–15)
Anion gap: 9 (ref 5–15)
BUN: 49 mg/dL — ABNORMAL HIGH (ref 8–23)
BUN: 27 mg/dL — ABNORMAL HIGH (ref 8–23)
CO2: 22 mmol/L (ref 22–32)
CO2: 25 mmol/L (ref 22–32)
Calcium: 8.2 mg/dL — ABNORMAL LOW (ref 8.9–10.3)
Calcium: 8 mg/dL — ABNORMAL LOW (ref 8.9–10.3)
Chloride: 102 mmol/L (ref 98–111)
Chloride: 102 mmol/L (ref 98–111)
Creatinine, Ser: 1.6 mg/dL — ABNORMAL HIGH (ref 0.44–1.00)
Creatinine, Ser: 1.02 mg/dL — ABNORMAL HIGH (ref 0.44–1.00)
GFR calc Af Amer: 37 mL/min — ABNORMAL LOW (ref >60)
GFR calc Af Amer: >60 mL/min (ref >60)
GFR calc non Af Amer: 32 mL/min — ABNORMAL LOW (ref >60)
GFR calc non Af Amer: 55 mL/min — ABNORMAL LOW (ref >60)
Glucose, Bld: 209 mg/dL — ABNORMAL HIGH (ref 70–99)
Glucose, Bld: 204 mg/dL — ABNORMAL HIGH (ref 70–99)
Phosphorus: 3.3 mg/dL (ref 2.5–4.6)
Phosphorus: 2.7 mg/dL (ref 2.5–4.6)
Potassium: 4.4 mmol/L (ref 3.5–5.1)
Potassium: 4.8 mmol/L (ref 3.5–5.1)
Sodium: 136 mmol/L (ref 135–145)
Sodium: 136 mmol/L (ref 135–145)

## 2019-07-22 LAB — APTT
aPTT: >160 seconds (ref 24–36)
aPTT: 157 seconds — ABNORMAL HIGH (ref 24–36)
aPTT: 106 seconds — ABNORMAL HIGH (ref 24–36)

## 2019-07-22 LAB — BLOOD GAS, ARTERIAL
Acid-base deficit: 3.3 mmol/L — ABNORMAL HIGH (ref 0.0–2.0)
Bicarbonate: 22.1 mmol/L (ref 20.0–28.0)
FIO2: 0.55
MECHVT: 450 mL
O2 Saturation: 97.3 %
PEEP: 5 cmH2O
Patient temperature: 37
RATE: 14 resp/min
pCO2 arterial: 40 mmHg (ref 32.0–48.0)
pH, Arterial: 7.35 (ref 7.350–7.450)
pO2, Arterial: 98 mmHg (ref 83.0–108.0)

## 2019-07-22 LAB — CBC
HCT: 23 % — ABNORMAL LOW (ref 36.0–46.0)
HCT: 28.4 % — ABNORMAL LOW (ref 36.0–46.0)
Hemoglobin: 7 g/dL — ABNORMAL LOW (ref 12.0–15.0)
Hemoglobin: 8.9 g/dL — ABNORMAL LOW (ref 12.0–15.0)
MCH: 27.7 pg (ref 26.0–34.0)
MCH: 28.5 pg (ref 26.0–34.0)
MCHC: 30.4 g/dL (ref 30.0–36.0)
MCHC: 31.3 g/dL (ref 30.0–36.0)
MCV: 90.9 fL (ref 80.0–100.0)
MCV: 91 fL (ref 80.0–100.0)
Platelets: 565 10*3/uL — ABNORMAL HIGH (ref 150–400)
Platelets: 566 10*3/uL — ABNORMAL HIGH (ref 150–400)
RBC: 2.53 MIL/uL — ABNORMAL LOW (ref 3.87–5.11)
RBC: 3.12 MIL/uL — ABNORMAL LOW (ref 3.87–5.11)
RDW: 17.6 % — ABNORMAL HIGH (ref 11.5–15.5)
RDW: 16.7 % — ABNORMAL HIGH (ref 11.5–15.5)
WBC: 19.2 10*3/uL — ABNORMAL HIGH (ref 4.0–10.5)
WBC: 19.7 10*3/uL — ABNORMAL HIGH (ref 4.0–10.5)
nRBC: 0.5 % — ABNORMAL HIGH (ref 0.0–0.2)
nRBC: 1.8 % — ABNORMAL HIGH (ref 0.0–0.2)

## 2019-07-22 LAB — GLUCOSE, CAPILLARY
Glucose-Capillary: 195 mg/dL — ABNORMAL HIGH (ref 70–99)
Glucose-Capillary: 171 mg/dL — ABNORMAL HIGH (ref 70–99)
Glucose-Capillary: 169 mg/dL — ABNORMAL HIGH (ref 70–99)
Glucose-Capillary: 198 mg/dL — ABNORMAL HIGH (ref 70–99)

## 2019-07-22 LAB — MAGNESIUM: Magnesium: 2.3 mg/dL (ref 1.7–2.4)

## 2019-07-22 LAB — PREPARE RBC (CROSSMATCH)

## 2019-07-22 MED ORDER — PIPERACILLIN-TAZOBACTAM 3.375 G IVPB
3.3750 g | Freq: Three times a day (TID) | INTRAVENOUS | Status: AC
  Administered 2019-07-22 – 2019-07-24 (×6): 3.375 g via INTRAVENOUS
  Filled 2019-07-22 (×6): qty 50

## 2019-07-22 MED ORDER — ADULT MULTIVITAMIN LIQUID CH
15.0000 mL | Freq: Every day | ORAL | Status: DC
  Administered 2019-07-23 – 2019-07-30 (×8): 15 mL
  Filled 2019-07-22 (×8): qty 15

## 2019-07-22 MED ORDER — SODIUM CHLORIDE 0.9% IV SOLUTION: Freq: Once | INTRAVENOUS | Status: AC

## 2019-07-22 MED ORDER — INSULIN ASPART 100 UNIT/ML ~~LOC~~ SOLN
0.0000 [IU] | SUBCUTANEOUS | Status: DC
  Administered 2019-07-22 (×3): 2 [IU] via SUBCUTANEOUS
  Administered 2019-07-23 (×2): 5 [IU] via SUBCUTANEOUS
  Filled 2019-07-22 (×5): qty 1

## 2019-07-22 MED ORDER — PIPERACILLIN-TAZOBACTAM 3.375 G IVPB: 3.3750 g | Freq: Three times a day (TID) | INTRAVENOUS | Status: DC

## 2019-07-22 MED ORDER — VITAL HIGH PROTEIN PO LIQD
1000.0000 mL | ORAL | Status: DC
  Administered 2019-07-22 – 2019-07-26 (×5): 1000 mL

## 2019-07-22 MED ORDER — B COMPLEX-C PO TABS
1.0000 | ORAL_TABLET | Freq: Every day | ORAL | Status: DC
  Administered 2019-07-22 – 2019-07-29 (×8): 1
  Filled 2019-07-22 (×9): qty 1

## 2019-07-22 MED ORDER — HEPARIN (PORCINE) 25000 UT/250ML-% IV SOLN
550.0000 [IU]/h | INTRAVENOUS | Status: DC
  Administered 2019-07-23: 500 [IU]/h via INTRAVENOUS
  Filled 2019-07-22: qty 250

## 2019-07-22 MED ORDER — FENTANYL 2500MCG IN NS 250ML (10MCG/ML) PREMIX INFUSION
0.0000 ug/h | INTRAVENOUS | Status: DC
  Administered 2019-07-22: 50 ug/h via INTRAVENOUS
  Administered 2019-07-23: 100 ug/h via INTRAVENOUS
  Administered 2019-07-24: 50 ug/h via INTRAVENOUS
  Administered 2019-07-25: 125 ug/h via INTRAVENOUS
  Administered 2019-07-26: 75 ug/h via INTRAVENOUS
  Administered 2019-07-27: 175 ug/h via INTRAVENOUS
  Administered 2019-07-27: 200 ug/h via INTRAVENOUS
  Administered 2019-07-28 (×2): 175 ug/h via INTRAVENOUS
  Administered 2019-07-30 – 2019-07-31 (×2): 75 ug/h via INTRAVENOUS
  Filled 2019-07-22 (×11): qty 250

## 2019-07-22 MED ORDER — SODIUM CHLORIDE 0.9 % IV SOLN
3.0000 g | Freq: Three times a day (TID) | INTRAVENOUS | Status: DC
  Filled 2019-07-22 (×2): qty 8

## 2019-07-22 MED ORDER — NOREPINEPHRINE 16 MG/250ML-% IV SOLN
0.0000 ug/min | INTRAVENOUS | Status: DC
  Administered 2019-07-22: 14 ug/min via INTRAVENOUS
  Administered 2019-07-22: 02:00:00 15 ug/min via INTRAVENOUS
  Administered 2019-07-23 – 2019-07-24 (×3): 16 ug/min via INTRAVENOUS
  Administered 2019-07-25: 17:00:00 13 ug/min via INTRAVENOUS
  Administered 2019-07-26: 14:00:00 5 ug/min via INTRAVENOUS
  Administered 2019-07-28: 18:00:00 32 ug/min via INTRAVENOUS
  Administered 2019-07-29: 04:00:00 28 ug/min via INTRAVENOUS
  Administered 2019-07-29: 17:00:00 21.333 ug/min via INTRAVENOUS
  Administered 2019-07-29: 10:00:00 40 ug/min via INTRAVENOUS
  Administered 2019-07-30: 04:00:00 22 ug/min via INTRAVENOUS
  Administered 2019-07-30 – 2019-08-01 (×4): 30 ug/min via INTRAVENOUS
  Administered 2019-08-01: 35 ug/min via INTRAVENOUS
  Filled 2019-07-22 (×18): qty 250

## 2019-07-22 MED ORDER — HEPARIN (PORCINE) 25000 UT/250ML-% IV SOLN
750.0000 [IU]/h | INTRAVENOUS | Status: DC
  Administered 2019-07-22: 04:00:00 750 [IU]/h via INTRAVENOUS

## 2019-07-22 MED ORDER — FENTANYL BOLUS VIA INFUSION
50.0000 ug | INTRAVENOUS | Status: DC | PRN
  Filled 2019-07-22: qty 100

## 2019-07-22 MED ORDER — PIPERACILLIN-TAZOBACTAM 3.375 G IVPB 30 MIN: 3.3750 g | Freq: Four times a day (QID) | INTRAVENOUS | Status: DC

## 2019-07-22 NOTE — Consult Note (Signed)
ANTICOAGULATION CONSULT NOTE - Initial Consult  Pharmacy Consult for Heparin Indication: atrial fibrillation  Allergies  Allergen Reactions  . Metformin And Related Diarrhea  . Sulfur Hives    Patient Measurements: Height: 5\' 2"  (157.5 cm) Weight: 140 lb (63.5 kg) IBW/kg (Calculated) : 50.1 Heparin Dosing Weight:  63.5 (actual body weight)  Vital Signs: Temp: 97.3 F (36.3 C) (01/26 0000) Temp Source: Axillary (01/25 1600) BP: 118/51 (01/26 0200) Pulse Rate: 97 (01/26 0200)  Labs: Recent Labs    07/19/19 0305 07/19/19 1403 07/19/19 1711 07/20/19 0500 07/20/19 0500 07/20/19 1052 07/20/19 1314 07/21/19 0446 07/21/19 0930 07/21/19 1246 07/22/19 0132  HGB  --   --   --  7.3*   < >  --   --  6.9* 7.3*  --   --   HCT  --   --   --  23.5*  --   --   --  22.9* 23.7*  --   --   PLT  --   --   --  384  --   --   --  349  --   --   --   APTT  --   --   --   --   --   --   --   --   --   --  >160*  LABPROT  --   --   --   --   --   --   --   --   --  30.3*  --   INR  --   --   --   --   --   --   --   --   --  2.9*  --   CREATININE 1.82*  --   --  2.00*  --   --   --  2.19*  --   --   --   TROPONINIHS 599*   < > 3,009*  --   --  1,404* 1,502*  --   --   --   --    < > = values in this interval not displayed.    Estimated Creatinine Clearance: 20.3 mL/min (A) (by C-G formula based on SCr of 2.19 mg/dL (H)).   Medical History: Past Medical History:  Diagnosis Date  . Arrhythmia    atrial fibrillation  . CHF (congestive heart failure) ([place])   . COPD (chronic obstructive pulmonary disease) ([place])   . Diabetes mellitus without complication ([place])   . Heart attack ([place])   . Hypertension   . Renal disorder     Assessment: 74 year-old female admitted with COVID 19 pneumonitis and severe COPD.  She is currently on apixaban at home for chronic atrial fibrillation.  Last inpatient dose of apixaban 1/24 @ 2200.  Will continue to monitor hemoglobin, which is low in the setting  of CKD.  Hgb 7.3 (slightly up from yesterday), plt wnl.  On 1/25, patient was intubated and received central line.  Goal of Therapy:  Heparin level 0.3-0.7 units/ml Monitor platelets by anticoagulation protocol: Yes   Plan:  01/26 @ 0130 aPTT > 160 seconds supratherapeutic. Called RN to confirm that aPTT was drawn appropriately, will hold heparin until 0415 and will restart at 750 units/hr and recheck aPTT at 1000, will continue to monitor.  Tobie Lords, PharmD 07/22/2019 2:20 AM

## 2019-07-22 NOTE — Consult Note (Signed)
Pharmacy Antibiotic Note  Andrea [name] is a 74 y.o. female admitted on 07/17/2019 with pneumonia.  Pharmacy has been consulted for Zosyn dosing.  Patient is currently admitted with multiorgan failure with underlying COPD and PMH of COVID-19 pneumonitis in November 2020.  She was discharged home and re-admitted for multifocal pneumonia in December 2020.  Chest X-ray reveals persistent bilateral interstitial and airspace opacities.  PMH is also significant for CHF, CABG, diabetes mellitus type 2, and CKDIII (baseline Scr 1.6-1.9).  On 1/25, the patient was intubated and received central line, and is now receiving CRRT.  She is currently on day 5 of antibiotics.    Patient is afebrile with elevated WBC on day 5 of IV steroids.  Plan: Will broaden coverage with Zosyn.  Discontinue Unasyn, and will start Zosyn 3.375 g q8 hours.  Pharmacy will continue to follow nephrology plan to ensure therapeutic dose.  Height: 5\' 2"  (157.5 cm) Weight: 158 lb 4.6 oz (71.8 kg) IBW/kg (Calculated) : 50.1  Temp (24hrs), Avg:98 F (36.7 C), Min:97.3 F (36.3 C), Max:98.3 F (36.8 C)  Recent Labs  Lab 06/27/2019 2139 07/04/2019 2343 07/19/19 0305 07/20/19 0500 07/21/19 0446 07/22/19 0408  WBC 13.7*  --   --  10.8* 11.8* 19.2*  CREATININE 1.78*  --  1.82* 2.00* 2.19* 1.60*  LATICACIDVEN 6.2* 2.8*  --   --   --   --     Estimated Creatinine Clearance: 29.5 mL/min (A) (by C-G formula based on SCr of 1.6 mg/dL (H)).    Allergies  Allergen Reactions  . Metformin And Related Diarrhea  . Sulfur Hives    Antimicrobials this admission: 1/22 Cefepime >> 1/22 1/23 Levofloxacin >> 1/25 1/22 Vancomycin >> 1/23 1/25 Unasyn >> 1/26 126 Zosyn >>   Dose adjustments this admission: None  Microbiology results: 1/22 BCx: NG x 4 days 1/23 MRSA PCR: negative 1/23 Resp Cx:  negative  Thank you for allowing pharmacy to be a part of this patient's care.  Gerald Dexter, PharmD 07/22/2019 1:39 PM

## 2019-07-22 NOTE — Progress Notes (Signed)
CRITICAL CARE NOTE  BRIEF PATIENT DESCRIPTION:  74 yo female dx with COVID-19 04/2019 admitted with acute on chronic renal failure with hyperkalemia, elevated troponin, and acute on chronic hypoxic hypercapnic respiratory failure secondary to bacterial pneumonia vs. 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 She presented to [facility] ER via EMS on 01/22 with worsening shortness of breath, cough, and fatigue.  At baseline pt wears chronic home O2 @2L , however due to symptoms she has had to increase her O2 to 4L 24hrs prior to current ER presentation.  En route to the ER EMS placed pt on 6L O2 and administered albuterol.  Upon arrival to the ER pt tachypneic with increased work of breathing requiring iv steroids, duonebs, and HFNC.  Lab results revealed Na+ 133, CO2 21, BUN 38, creatinine 1.78, glucose 300, anion gap 17, troponin 90, lactic acid 6.2, pct 1.11, wbc 13.7, hgb 8.4, and vbg pH 7.38/pCO2 42.  CXR concerning for multifocal pneumonia and small bilateral pleural effusions.  She received solumedrol, vancomycin, and cefepime.  She was subsequently admitted to the stepdown unit per hospitalist team for additional workup and treatment, however remained in the ER pending bed availability.  On 01/23 pt developed worsening acute hypoxic respiratory failure possibly due to flash pulmonary edema after receiving a total of 3L of fluids requiring mechanical intubation.  PCCM contacted to assume care.  Pt does have a hx of COVID-19 diagnosed 05/14/2020, which required hospitalization/treatment at [facility]. Pt discharged home on 05/20/2019.  However, she required hospitalization again at [facility] on  05/31/2019 due to multifocal pneumonia, following treatment she was discharged home on 06/29/2019.       CC follow up Resp failure  SUBJECTIVE Remains critically ill Prognosis is poor Multiorgan failure and cardiogenic shock On CRRT   BP (!) 116/48   Pulse 97   Temp 98.2 F (36.8 C) (Axillary)   Resp 13   Ht 5' 2"  (1.575 m)   Wt 71.8 kg   SpO2 100%   BMI 28.95 kg/m    I/O last 3 completed shifts: In: 4742 [I.V.:1026.5; IV Piggyback:297.5] Out: 44 [Urine:380; Emesis/NG output:75; Other:550] Total I/O In: 191.2 [I.V.:191.2] Out: 55 [Other:55]  SpO2: 100 % O2 Flow Rate (L/min): 4 L/min FiO2 (%): 55 %   SIGNIFICANT EVENTS/STUDIES:  01/22: Pt admitted to the stepdown unit with acute hypoxic respiratory failure secondary to multifocal pneumonia, however remained in the ER pending bed availability 01/23: Pt developed worsening acute hypoxic respiratory failure requiring emergent mechanical intubation in the ER.  PCCM contacted to assume care 1/24 - parameters for SBT met, patient s/p weaning trial with successful liberation from MV.  Updated daughter [name].   1/25 increased WOB and back on biPAP, re-intubated, started on CRRT, family updated multiple times 1/26 severe multiorgan failure, cardiogenic shock, on CRRT, multiple vasopressors   REVIEW OF SYSTEMS  PATIENT IS UNABLE TO PROVIDE COMPLETE REVIEW OF SYSTEM S DUE TO SEVERE CRITICAL ILLNESS AND ENCEPHALOPATHY   PHYSICAL EXAMINATION:  GENERAL:critically ill appearing, +resp distress HEAD: Normocephalic, atraumatic.  EYES: Pupils equal, round, reactive to light.  No scleral icterus.  MOUTH: Moist mucosal membrane. NECK: Supple. No thyromegaly. No nodules. No JVD.  PULMONARY: +rhonchi, +wheezing CARDIOVASCULAR:  S1 and S2. Regular rate and rhythm. No murmurs, rubs, or gallops.  GASTROINTESTINAL: Soft, nontender, -distended. Positive bowel sounds.  MUSCULOSKELETAL: No swelling, clubbing, or edema.  NEUROLOGIC:  obtunded SKIN:intact,warm,dry    CULTURE RESULTS   Recent Results (from the past 240 hour(s))  Culture, blood (Routine x 2)     Status: None (Preliminary result)   Collection Time: 07/08/2019  9:40 PM   Specimen: BLOOD  Result Value Ref Range Status   Specimen Description BLOOD RIGHT ANTECUBITAL  Final   Special Requests   Final    BOTTLES DRAWN AEROBIC AND ANAEROBIC Blood Culture adequate volume   Culture   Final    NO GROWTH 4 DAYS Performed at [facility], [address] [place], [place] [address]    Report Status PENDING  Incomplete  Culture, blood (Routine x 2)     Status: None (Preliminary result)   Collection Time: 06/30/2019  9:40 PM   Specimen: BLOOD  Result Value Ref Range Status   Specimen Description BLOOD LEFT ANTECUBITAL  Final   Special Requests   Final    BOTTLES DRAWN AEROBIC AND ANAEROBIC Blood Culture adequate volume   Culture   Final    NO GROWTH 4 DAYS Performed at [facility], [address], [place], [place] [address]    Report Status PENDING  Incomplete  MRSA PCR Screening     Status: None   Collection Time: 07/19/19  6:45 AM   Specimen: Nasal Mucosa; Nasopharyngeal  Result Value Ref Range Status   MRSA by PCR NEGATIVE NEGATIVE Final    Comment:        The GeneXpert MRSA Assay (FDA approved for NASAL specimens only), is one component of a comprehensive MRSA colonization surveillance program. It is not intended to diagnose MRSA infection nor to guide or monitor treatment for MRSA infections. Performed at [facility], [address] [place], [place] [address]   Respiratory Panel by PCR     Status: None   Collection Time: 07/19/19 10:03 AM   Specimen: Nasopharyngeal Swab; Respiratory  Result Value Ref Range Status   Adenovirus NOT DETECTED NOT DETECTED Final   Coronavirus 229E NOT DETECTED NOT DETECTED Final    Comment: (NOTE) The Coronavirus on the Respiratory Panel, DOES NOT test for the novel  Coronavirus  (2019 nCoV)    Coronavirus HKU1 NOT DETECTED NOT DETECTED Final   Coronavirus NL63 NOT DETECTED NOT DETECTED Final   Coronavirus OC43 NOT DETECTED NOT DETECTED Final   Metapneumovirus NOT DETECTED NOT DETECTED Final   Rhinovirus / Enterovirus NOT DETECTED NOT DETECTED Final   Influenza A NOT DETECTED NOT DETECTED Final   Influenza B NOT DETECTED NOT DETECTED Final   Parainfluenza Virus 1 NOT DETECTED NOT DETECTED Final   Parainfluenza Virus 2 NOT DETECTED NOT DETECTED Final   Parainfluenza Virus 3 NOT DETECTED NOT DETECTED Final   Parainfluenza Virus 4 NOT DETECTED NOT DETECTED Final   Respiratory Syncytial Virus NOT DETECTED NOT DETECTED Final   Bordetella pertussis NOT DETECTED NOT DETECTED Final   Chlamydophila pneumoniae NOT DETECTED NOT DETECTED Final   Mycoplasma pneumoniae NOT DETECTED NOT DETECTED Final    Comment: Performed at [facility], [address] [place], [place] [address]          IMAGING    DG Abd 1 View  Result Date: 07/22/2019 CLINICAL DATA:  Encounter for orogastric tube placement EXAM: ABDOMEN - 1 VIEW COMPARISON:  07/19/2019 FINDINGS: Normal bowel gas pattern  were covered. Enteric tube with tip at the mid stomach. There is pulmonary opacity and pleural effusions. Artifact from EKG leads. Partially covered central line at the upper cavoatrial junction. IMPRESSION: Orogastric tube in good position. Electronically Signed   By: Monte Fantasia M.D.   On: 07/22/2019 04:54   DG Chest Port 1 View  Result Date: 07/22/2019 CLINICAL DATA:  Acute respiratory failure EXAM: PORTABLE CHEST 1 VIEW COMPARISON:  Radiograph 07/21/2019 FINDINGS: *Endotracheal tube terminates low in the trachea, less than 1 cm from the carina and should be retracted. *Transesophageal tube side port terminates near the GE junction and should be advanced at least 3-5 cm to fully position within the gastric lumen. *A right IJ catheter sheath terminates at the level of the right atrium.  *Surgical clips rejected over the right axilla and breast. *Vascular stent seen superior to the aortic arch in similar position to priors. Mixed bilateral interstitial and airspace opacities are present in both lungs with similar to slightly increased size of the bilateral pleural effusions. No pneumothorax. Cardiomediastinal contours are similar to prior. Postsurgical changes related to prior CABG including intact and aligned sternotomy wires and multiple surgical clips projecting over the mediastinum. Coronary stents are noted overlying the mediastinal silhouette as well. No acute osseous or soft tissue abnormality. Degenerative changes are present in the imaged spine and shoulders. IMPRESSION: 1. Endotracheal tube low in position, less than 1 cm from the carina and should be retracted. 2. Transesophageal tube side port terminates near the GE junction and should be advanced at least 3-5 cm to fully position within the gastric lumen. 3. Persistent bilateral interstitial and airspace opacities compatible with pneumonia and/or edema. These results will be called to the ordering clinician or representative by the Radiologist Assistant, and communication documented in the PACS or zVision Dashboard. Electronically Signed   By: Lovena Le M.D.   On: 07/22/2019 03:57   DG Chest Port 1 View  Result Date: 07/21/2019 CLINICAL DATA:  Intubation, OG tube placement, and central line placement. EXAM: PORTABLE CHEST 1 VIEW COMPARISON:  07/19/2019 FINDINGS: An endotracheal tube terminates less than 1 cm above the carina, deeper than on the prior study and directed towards the right mainstem bronchus. An enteric tube terminates over the stomach. A right jugular catheter terminates over the high right atrium and is new from the prior study. Sequelae of CABG are again identified. The cardiomediastinal silhouette is unchanged. There are persistent interstitial densities throughout both lungs. Patchy airspace opacities persist in  the mid lungs and have increased in the lung bases with enlarging small bilateral pleural effusions. No pneumothorax is identified. IMPRESSION: 1. Endotracheal tube now terminates less than 1 cm above the carina near the right mainstem bronchus orifice. Recommend retracting 2-3 cm. 2. Bilateral interstitial and airspace opacities with worsening aeration of the lung bases which may reflect edema or pneumonia along with enlarging pleural effusions. Electronically Signed   By: Logan Bores M.D.   On: 07/21/2019 17:38    CBC    Component Value Date/Time   WBC 19.2 (H) 07/22/2019 0408   RBC 2.53 (L) 07/22/2019 0408   HGB 7.0 (L) 07/22/2019 0408   HCT 23.0 (L) 07/22/2019 0408   PLT 565 (H) 07/22/2019 0408   MCV 90.9 07/22/2019 0408   MCH 27.7 07/22/2019 0408   MCHC 30.4 07/22/2019 0408   RDW 17.6 (H) 07/22/2019 0408   LYMPHSABS 0.6 (L) 07/21/2019 0446   MONOABS 0.9 07/21/2019 0446   EOSABS 0.0 07/21/2019 0446  BASOSABS 0.0 07/21/2019 0446   BMP Latest Ref Rng & Units 07/22/2019 07/21/2019 07/21/2019  Glucose 70 - 99 mg/dL 209(H) - 164(H)  BUN 8 - 23 mg/dL 49(H) - 63(H)  Creatinine 0.44 - 1.00 mg/dL 1.60(H) - 2.19(H)  Sodium 135 - 145 mmol/L 136 - 139  Potassium 3.5 - 5.1 mmol/L 4.4 4.6 6.0(H)  Chloride 98 - 111 mmol/L 102 - 105  CO2 22 - 32 mmol/L 22 - 24  Calcium 8.9 - 10.3 mg/dL 8.2(L) - 8.9       ASSESSMENT AND PLAN SYNOPSIS   Severe ACUTE Hypoxic and Hypercapnic Respiratory Failure COVID 19 pneumonitis and severe COPD exacerbation Severe ACUTE Hypoxic and Hypercapnic Respiratory Failure -continue Mechanical Ventilator support -continue Bronchodilator Therapy -Wean Fio2 and PEEP as tolerated -VAP/VENT bundle implementation   KIDNEY INJURY/Renal Failure -follow chem 7 -follow UO -continue Foley Catheter-assess need -Avoid nephrotoxic agents CRRT as needed   ACUTE SYSTOLIC CARDIAC FAILURE- EF 25% NSTEMI-on heparin drip Afib with RVR-on amiodarone  infusion   CARDIAC ICU monitoring  ID -follow up cultures   GI GI PROPHYLAXIS as indicated  NUTRITIONAL STATUS DIET-->TF's as tolerated Constipation protocol as indicated   ENDO - ICU hypoglycemic\Hyperglycemia protocol -check FSBS per protocol  ELECTROLYTES -follow labs as needed -replace as needed -pharmacy consultation and following    DVT/GI PRX ordered TRANSFUSIONS AS NEEDED MONITOR FSBS ASSESS the need for LABS as needed    Critical Care Time devoted to patient care services described in this note is 35 minutes.   Overall, patient is critically ill, prognosis is guarded.  Patient with Multiorgan failure and at high risk for cardiac arrest and death.   Recommend DNR status Follow up Palliative care consultation   Corrin Parker, M.D.  [facility]  Medical Director [place] Director [facility] Cardio-Pulmonary Department

## 2019-07-22 NOTE — Consult Note (Addendum)
[place] for Heparin Indication: atrial fibrillation  Allergies  Allergen Reactions  . Metformin And Related Diarrhea  . Sulfur Hives    Patient Measurements: Height: 5\' 2"  (157.5 cm) Weight: 158 lb 4.6 oz (71.8 kg) IBW/kg (Calculated) : 50.1 Heparin Dosing Weight:  71.8 kg (actual body weight)  Vital Signs: Temp: 97.7 F (36.5 C) (01/26 2000) Temp Source: Axillary (01/26 2000) BP: 121/49 (01/26 2000) Pulse Rate: 95 (01/26 2000)  Labs: Recent Labs    07/20/19 0500 07/20/19 1052 07/20/19 1314 07/21/19 0446 07/21/19 0446 07/21/19 0930 07/21/19 0930 07/21/19 1246 07/22/19 0132 07/22/19 0408 07/22/19 0955 07/22/19 1600 07/22/19 1942  HGB   < >  --   --  6.9*   < > 7.3*   < >  --   --  7.0*  --   --  8.9*  HCT   < >  --   --  22.9*   < > 23.7*  --   --   --  23.0*  --   --  28.4*  PLT   < >  --   --  349  --   --   --   --   --  565*  --   --  566*  APTT  --   --   --   --   --   --   --   --  >160*  --  157*  --  106*  LABPROT  --   --   --   --   --   --   --  30.3*  --   --   --   --   --   INR  --   --   --   --   --   --   --  2.9*  --   --   --   --   --   CREATININE   < >  --   --  2.19*  --   --   --   --   --  1.60*  --  1.02*  --   TROPONINIHS  --  1,404* 1,502*  --   --   --   --   --   --   --   --   --   --    < > = values in this interval not displayed.    Estimated Creatinine Clearance: 46.3 mL/min (A) (by C-G formula based on SCr of 1.02 mg/dL (H)).   Medical History: Past Medical History:  Diagnosis Date  . Arrhythmia    atrial fibrillation  . CHF (congestive heart failure) ([place])   . COPD (chronic obstructive pulmonary disease) ([place])   . Diabetes mellitus without complication ([place])   . Heart attack ([place])   . Hypertension   . Renal disorder     Assessment: 74 year-old female admitted with COVID 19 pneumonitis, severe COPD, and multi-organ failure with NSTEMI and atrial fibrillation rate-controlled  with amiodarone.   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  She is currently on apixaban at home for chronic atrial fibrillation.  Last inpatient dose of apixaban 1/24 @ 2200.  Will continue to monitor hemoglobin, which is low in the setting of CKD.  On 1/25, patient was intubated and received central line.  She is receiving CRRT currently. No baseline heparin level was drawn.   1/26 0132 aPTT > 160 held heparin  until 0415 and will restart at 750 units/hr  1/26 0955 aPTT 157 held heparin for 1 hour, and resume at 600 units/hr. 1/26 1942 aPTT 106 decrease the heparin infusion to 550 units/hr  Goal of Therapy:  Heparin level 0.3-0.7 units/ml Goal aPTT level is 66-102 seconds.  Monitor platelets by anticoagulation protocol: Yes   Plan:  APTT level is slightly supratherapetic. Will decrease the heparin infusion to 550 units/hr. Baseline Hgb seems to be 8-8.4. There was a drop in hgb on the 1/25 and continued to be low on 1/26 AM, pt was transfused on 1/26 and most recent Hgb is at 8.9. Heparin level was not ordered.  Will follow up with aPTT/HL/CBC in 8 hours. Switch to heparin level once aPTT and heparin level correlate.   Oswald Hillock, PharmD, BCPS 07/22/2019 8:17 PM

## 2019-07-22 NOTE — Consult Note (Signed)
Pharmacy has been consulted to monitor medication adjustments for CRRT.  Zosyn has been adjusted to 3.375 g q8 hours No further adjustments are necessary at this time.   Gerald Dexter, PharmD 07/22/2019 1:55 PM

## 2019-07-22 NOTE — Consult Note (Signed)
PHARMACY CONSULT NOTE - FOLLOW UP  Pharmacy Consult for Electrolyte Monitoring and Replacement   Andrea [name] is a 74 y.o. female admitted on 07/27/2019 with acute on chronic renal failure with hyperkalemia, elevated troponin, and acute on chronic hypoxic hypercapnic respiratory failure secondary to bacterial pneumonia vs COVID-19 pneumonia. PMH includes MI, Afib, HTN, CABG and MRA of 2020, T2DM, COPD on chronic home oxygen, chronic diastolic heart failure, CKD Stage III, COVID-19 (tested positive 04/2019). Pt was intubated yesterday on fentanyl IV. Pt has also started CRRT 01/25. Pharmacy consulted to assist with monitoring and replacing electrolytes.    Recent Labs: Potassium (mmol/L)  Date Value  07/22/2019 4.4   Magnesium (mg/dL)  Date Value  07/22/2019 2.3   Calcium (mg/dL)  Date Value  07/22/2019 8.2 (L)   Albumin (g/dL)  Date Value  07/22/2019 2.4 (L)   Phosphorus (mg/dL)  Date Value  07/22/2019 3.3   Sodium (mmol/L)  Date Value  07/22/2019 136     Assessment: 1. Electrolytes: Electrolytes are WNL. Corrected calcium is 9.48 (WNL). Patient is on CRRT. Pt started Zosyn today, which may contribute to a slight increase in sodium. Will defer electrolytes monitoring to nephrology. Continue monitoring electrolytes in am labs. Will replace to achieve potassium goal ~4, sodium ~140, magnesium ~2.   2. Glucose: Today's range: 169-209. Glucose-capillary monitored Q4H. Pt is on insulin glargine 10 units [place] BID and insulin aspart 0-9 units [place] Q4H. Pt is on methylprednisolone 40mg  IV Q24H. Continue insulin administrations and monitor glucose levels daily.   3. Constipation: Last BM: Prior to admission. Continue senna-docusate 2 tabs per tube BID while pt is on continuous fentanyl infusion.    Roanna Banning ,PharmD Candidate 07/22/2019 2:18 PM

## 2019-07-22 NOTE — Progress Notes (Signed)
Initial Nutrition Assessment  DOCUMENTATION CODES:   Not applicable  INTERVENTION:   Vital HP @50ml /hr  Free water flushes 9ml q4 hours to maintain tube patency   Regimen provides 1200kcal/day, 105g/day protein, 1125ml/day free water  Liquid MVI daily via tube  B-complex with C daily via tube  NUTRITION DIAGNOSIS:   Inadequate oral intake related to inability to eat(pt sedated and ventilated) as evidenced by NPO status.  GOAL:   Provide needs based on ASPEN/SCCM guidelines  MONITOR:   Vent status, Labs, Weight trends, Skin, I & O's  REASON FOR ASSESSMENT:   Consult Enteral/tube feeding initiation and management  ASSESSMENT:   74 y.o. female with a PMHx of hypertension, myocardial infarction, CABG and MRA of 2020, diabetes mellitus type 2, COPD on chronic home oxygen, chronic diastolic heart failure, chronic kidney disease stage III baseline creatinine 1.6-1.9, COVID-19 infection 05/15/2019, who was admitted to [facility] on 06/28/2019 for evaluation of increasing shortness of breath, cough, fatigue and found ot have PNA   Pt sedated and ventilated. OGT in place; plan is to start tube feeds today. Suspect pt with fairly good appetite and oral intake at baseline as pt is weight stable pta. Pt documented to be eating 100% of meals during her last admit. Pt tolerating CRRT per nephrology note. UOP 253ml.   Medications reviewed and include: pepcid, insulin, solu-medrol, senokot, precedex, fentanyl, heparin, levophed, zosyn  Labs reviewed: K 4.4 wnl, BUN 49(H), creat 1.60(H), P 3.3 wnl, Mg 2.3 wnl Wbc- 19.2(H), Hgb 7.0(L), Hct 23.0(L) cbgs- 195, 171, 169 x 24 hrs AIC 7.9(H)- 1/22  Patient is currently intubated on ventilator support MV: 6.9 L/min Temp (24hrs), Avg:98 F (36.7 C), Min:97.3 F (36.3 C), Max:98.3 F (36.8 C)  Propofol: none  MAP- >63mmHg  NUTRITION - FOCUSED PHYSICAL EXAM:    Most Recent Value  Orbital Region  No depletion  Upper Arm Region  No  depletion  Thoracic and Lumbar Region  No depletion  Buccal Region  No depletion  Temple Region  No depletion  Clavicle Bone Region  Mild depletion  Clavicle and Acromion Bone Region  Mild depletion  Scapular Bone Region  No depletion  Dorsal Hand  No depletion  Patellar Region  No depletion  Anterior Thigh Region  No depletion  Posterior Calf Region  No depletion  Edema (RD Assessment)  Mild  Hair  Reviewed  Eyes  Reviewed  Mouth  Reviewed  Skin  Reviewed  Nails  Reviewed     Diet Order:   Diet Order    None     EDUCATION NEEDS:   No education needs have been identified at this time  Skin:  Skin Assessment: Reviewed RN Assessment(Ecchymosis)  Last BM:  PTA  Height:   Ht Readings from Last 1 Encounters:  07/26/2019 5\' 2"  (1.575 m)    Weight:   Wt Readings from Last 1 Encounters:  07/22/19 71.8 kg    Ideal Body Weight:  50 kg  BMI:  Body mass index is 28.95 kg/m.  Estimated Nutritional Needs:   Kcal:  1286kcal/day  Protein:  95-105g/day  Fluid:  1.3L/day  Koleen Distance MS, RD, LDN Pager #- [phone] Office#- [phone] After Hours Pager: [phone]

## 2019-07-22 NOTE — Progress Notes (Signed)
Palliative:  HPI: 74 y.o. female  with past medical history of COPD, CHF, atrial fibrillation, CAD s/p CABG 2020, h/o MI, h/o PE, hypertension, CKD stage 3, diabetes, breast cancer in remission admitted on 07/01/2019 with shortness of breath. Hospitalization 3 times over Nov-Dec 2020 with COVID pneumonia. Reportedly had steady improvement until ~3 days prior to this admission when she became more short of breath and with dry cough and diagnosed with multifocal pneumonia. Acute decompensation and required intubation 1/23 and was extubated 1/24 but now with increased work of breathing and requiring BiPAP. Continues to be high risk for re-intubation with COVID pneumonitis and severe COPD exacerbation. 07/21/19 Re-intubated after my discussion and visit and no 07/22/19 on CRRT and ven support as well as requiring vasopressor support and amiodarone infusion.   I met today with Ms. Fitzgerald's daughters, Linus Orn and Lattie Haw. I also called and spoke with [name] later today. Discussed plan with Tiffany, RN for decreasing sedation (if able) and allowing daughters to speak to Ms. Boniface as there are things they need to say. They both understand that she is critically ill. I also met with them along with Dr. Mortimer Fries and we discussed potential decisions for the future with consideration of DNR, potential need for tracheostomy, and what this could look like for her course and quality of life.   My discussion with daughter, [name], later today she expresses that they would not desire tracheostomy and they know this would not be a good quality of life for Ms. Laser. They plan to sit down as a family to further discuss code status. They are all still hoping for her to be optimized and hopeful she can eventually return home. However, they are also very realistic that she is critically ill and high risk for continued decline with severe multiorgan failure (heart, lungs, kidneys).   I will continue to follow and support family and patient. They  are trying to balance following Ms. Lagrand's wishes for aggressive care with options that are in her best interest that she may not understand the consequences of these interventions and unfortunately we are unable to discuss with her in her current state.  All questions/concerns addressed. Emotional support provided.   Exam: Arousable and responds even sedated on vent. No distress. CRRT running. HR regular rate and rhythm on amiodarone infusion. Breathing regular, unlabored on vent support. Abd soft, flat.   Plan: - Ongoing goals of care discussions based on Ms. Muntean's progress.  - No plans for trach/PEG.    [place], NP Palliative Medicine Team Pager [phone] (Please see [email or web address] for schedule) Team Phone [phone]    Greater than 50%  of this time was spent counseling and coordinating care related to the above assessment and plan

## 2019-07-22 NOTE — Progress Notes (Signed)
PT Cancellation Note  Patient Details Name: Andrea [name] MRN: [id number] DOB: 18-Apr-1946   Cancelled Treatment:    Reason Eval/Treat Not Completed: Medical issues which prohibited therapy; Pt with decline in status 07/21/19 resulting in pt requiring to be re-intubated.  Will complete PT orders at this time but will reassess pt pending a change in status upon receipt of new PT orders.    Linus Salmons PT, DPT 07/22/19, 10:02 AM

## 2019-07-22 NOTE — Progress Notes (Signed)
[facility]  ROUNDING NOTE   Subjective:  Patient seen and evaluated at bedside. Remains critically ill. Tolerating CRRT well thus far. Still on the ventilator at this time.   Objective:  Vital signs in last 24 hours:  Temp:  [97.3 F (36.3 C)-98.3 F (36.8 C)] 97.8 F (36.6 C) (01/26 0800) Pulse Rate:  [87-131] 96 (01/26 1000) Resp:  [9-34] 16 (01/26 1000) BP: (81-133)/(39-74) 121/57 (01/26 1000) SpO2:  [86 %-100 %] 100 % (01/26 1000) FiO2 (%):  [40 %-55 %] 55 % (01/26 0850) Weight:  [71.8 kg] 71.8 kg (01/26 0454)  Weight change:  Filed Weights   07/02/2019 2134 07/22/19 0454  Weight: 63.5 kg 71.8 kg    Intake/Output: I/O last 3 completed shifts: In: 3976 [I.V.:1026.5; IV Piggyback:297.5] Out: 23 [Urine:380; Emesis/NG output:75; Other:550]   Intake/Output this shift:  Total I/O In: 386.5 [I.V.:317.8; IV Piggyback:68.7] Out: 226 [Other:226]  Physical Exam: General: Critically ill-appearing  Head: Normocephalic, atraumatic.  Endotracheal tube in place.  Eyes: Anicteric  Neck: Supple, trachea midline  Lungs:  Scattered rhonchi, vent assisted  Heart: S1S2 no rubs  Abdomen:  Soft, nontender, bowel sounds present  Extremities: trace peripheral edema.  Neurologic: Intubated, sedated  Skin: No lesions  Access: Temporary right IJ dialysis catheter    Basic Metabolic Panel: Recent Labs  Lab 06/30/2019 2139 07/16/2019 2139 07/19/19 0305 07/19/19 0305 07/20/19 0500 07/21/19 0446 07/21/19 0930 07/22/19 0408  NA 133*  --  132*  --  134* 139  --  136  K 4.6   < > 5.4*  --  4.1 6.0* 4.6 4.4  CL 95*  --  97*  --  100 105  --  102  CO2 21*  --  23  --  21* 24  --  22  GLUCOSE 300*  --  426*  --  185* 164*  --  209*  BUN 38*  --  43*  --  55* 63*  --  49*  CREATININE 1.78*  --  1.82*  --  2.00* 2.19*  --  1.60*  CALCIUM 8.8*   < > 8.1*   < > 8.4* 8.9  --  8.2*  MG  --   --   --   --   --  1.7  --  2.3  PHOS  --   --   --   --   --  4.9*  --  3.3   < >  = values in this interval not displayed.    Liver Function Tests: Recent Labs  Lab 07/17/2019 2139 07/19/19 0305 07/20/19 0500 07/21/19 0446 07/22/19 0408  AST 34 127* 107* 63*  --   ALT 25 82* 116* 94*  --   ALKPHOS 93 122 99 91  --   BILITOT 0.7 1.1 0.7 0.6  --   PROT 6.8 6.2* 5.9* 5.7*  --   ALBUMIN 2.3* 1.9* 2.0* 2.3* 2.4*   No results for input(s): LIPASE, AMYLASE in the last 168 hours. No results for input(s): AMMONIA in the last 168 hours.  CBC: Recent Labs  Lab 06/30/2019 2139 07/20/19 0500 07/21/19 0446 07/21/19 0930 07/22/19 0408  WBC 13.7* 10.8* 11.8*  --  19.2*  NEUTROABS 8.2*  --  10.0*  --   --   HGB 8.4* 7.3* 6.9* 7.3* 7.0*  HCT 27.7* 23.5* 22.9* 23.7* 23.0*  MCV 92.0 89.0 90.5  --  90.9  PLT 447* 384 349  --  565*    Cardiac Enzymes:  No results for input(s): CKTOTAL, CKMB, CKMBINDEX, TROPONINI in the last 168 hours.  BNP: Invalid input(s): POCBNP  CBG: Recent Labs  Lab 07/21/19 1612 07/21/19 2004 07/21/19 2339 07/22/19 0406 07/22/19 0734  GLUCAP 156* 278* 222* 195* 171*    Microbiology: Results for orders placed or performed during the hospital encounter of 07/20/2019  Culture, blood (Routine x 2)     Status: None (Preliminary result)   Collection Time: 07/14/2019  9:40 PM   Specimen: BLOOD  Result Value Ref Range Status   Specimen Description BLOOD RIGHT ANTECUBITAL  Final   Special Requests   Final    BOTTLES DRAWN AEROBIC AND ANAEROBIC Blood Culture adequate volume   Culture   Final    NO GROWTH 4 DAYS Performed at [facility] [address], [place], [place] [address]    Report Status PENDING  Incomplete  Culture, blood (Routine x 2)     Status: None (Preliminary result)   Collection Time: 07/13/2019  9:40 PM   Specimen: BLOOD  Result Value Ref Range Status   Specimen Description BLOOD LEFT ANTECUBITAL  Final   Special Requests   Final    BOTTLES DRAWN AEROBIC AND ANAEROBIC Blood Culture adequate volume   Culture   Final     NO GROWTH 4 DAYS Performed at [facility], [address] [place], [place] [address]    Report Status PENDING  Incomplete  MRSA PCR Screening     Status: None   Collection Time: 07/19/19  6:45 AM   Specimen: Nasal Mucosa; Nasopharyngeal  Result Value Ref Range Status   MRSA by PCR NEGATIVE NEGATIVE Final    Comment:        The GeneXpert MRSA Assay (FDA approved for NASAL specimens only), is one component of a comprehensive MRSA colonization surveillance program. It is not intended to diagnose MRSA infection nor to guide or monitor treatment for MRSA infections. Performed at [facility], [address] [place], [place] [address]   Respiratory Panel by PCR     Status: None   Collection Time: 07/19/19 10:03 AM   Specimen: Nasopharyngeal Swab; Respiratory  Result Value Ref Range Status   Adenovirus NOT DETECTED NOT DETECTED Final   Coronavirus 229E NOT DETECTED NOT DETECTED Final    Comment: (NOTE) The Coronavirus on the Respiratory Panel, DOES NOT test for the novel  Coronavirus (2019 nCoV)    Coronavirus HKU1 NOT DETECTED NOT DETECTED Final   Coronavirus NL63 NOT DETECTED NOT DETECTED Final   Coronavirus OC43 NOT DETECTED NOT DETECTED Final   Metapneumovirus NOT DETECTED NOT DETECTED Final   Rhinovirus / Enterovirus NOT DETECTED NOT DETECTED Final   Influenza A NOT DETECTED NOT DETECTED Final   Influenza B NOT DETECTED NOT DETECTED Final   Parainfluenza Virus 1 NOT DETECTED NOT DETECTED Final   Parainfluenza Virus 2 NOT DETECTED NOT DETECTED Final   Parainfluenza Virus 3 NOT DETECTED NOT DETECTED Final   Parainfluenza Virus 4 NOT DETECTED NOT DETECTED Final   Respiratory Syncytial Virus NOT DETECTED NOT DETECTED Final   Bordetella pertussis NOT DETECTED NOT DETECTED Final   Chlamydophila pneumoniae NOT DETECTED NOT DETECTED Final   Mycoplasma pneumoniae NOT DETECTED NOT DETECTED Final    Comment: Performed at [facility], [address], [place], [place] [address]    Coagulation Studies: Recent Labs    07/21/19 1246  LABPROT 30.3*  INR 2.9*    Urinalysis: No results for input(s): COLORURINE, LABSPEC, Walker, GLUCOSEU,  HGBUR, BILIRUBINUR, KETONESUR, PROTEINUR, UROBILINOGEN, NITRITE, LEUKOCYTESUR in the last 72 hours.  Invalid input(s): APPERANCEUR    Imaging: DG Abd 1 View  Result Date: 07/22/2019 CLINICAL DATA:  Encounter for orogastric tube placement EXAM: ABDOMEN - 1 VIEW COMPARISON:  07/19/2019 FINDINGS: Normal bowel gas pattern were covered. Enteric tube with tip at the mid stomach. There is pulmonary opacity and pleural effusions. Artifact from EKG leads. Partially covered central line at the upper cavoatrial junction. IMPRESSION: Orogastric tube in good position. Electronically Signed   By: Monte Fantasia M.D.   On: 07/22/2019 04:54   DG Chest Port 1 View  Result Date: 07/22/2019 CLINICAL DATA:  Acute respiratory failure EXAM: PORTABLE CHEST 1 VIEW COMPARISON:  Radiograph 07/21/2019 FINDINGS: *Endotracheal tube terminates low in the trachea, less than 1 cm from the carina and should be retracted. *Transesophageal tube side port terminates near the GE junction and should be advanced at least 3-5 cm to fully position within the gastric lumen. *A right IJ catheter sheath terminates at the level of the right atrium. *Surgical clips rejected over the right axilla and breast. *Vascular stent seen superior to the aortic arch in similar position to priors. Mixed bilateral interstitial and airspace opacities are present in both lungs with similar to slightly increased size of the bilateral pleural effusions. No pneumothorax. Cardiomediastinal contours are similar to prior. Postsurgical changes related to prior CABG including intact and aligned sternotomy wires and multiple surgical clips projecting over the mediastinum. Coronary stents are noted overlying the mediastinal silhouette as well. No acute osseous or soft tissue  abnormality. Degenerative changes are present in the imaged spine and shoulders. IMPRESSION: 1. Endotracheal tube low in position, less than 1 cm from the carina and should be retracted. 2. Transesophageal tube side port terminates near the GE junction and should be advanced at least 3-5 cm to fully position within the gastric lumen. 3. Persistent bilateral interstitial and airspace opacities compatible with pneumonia and/or edema. These results will be called to the ordering clinician or representative by the Radiologist Assistant, and communication documented in the PACS or zVision Dashboard. Electronically Signed   By: Lovena Le M.D.   On: 07/22/2019 03:57   DG Chest Port 1 View  Result Date: 07/21/2019 CLINICAL DATA:  Intubation, OG tube placement, and central line placement. EXAM: PORTABLE CHEST 1 VIEW COMPARISON:  07/19/2019 FINDINGS: An endotracheal tube terminates less than 1 cm above the carina, deeper than on the prior study and directed towards the right mainstem bronchus. An enteric tube terminates over the stomach. A right jugular catheter terminates over the high right atrium and is new from the prior study. Sequelae of CABG are again identified. The cardiomediastinal silhouette is unchanged. There are persistent interstitial densities throughout both lungs. Patchy airspace opacities persist in the mid lungs and have increased in the lung bases with enlarging small bilateral pleural effusions. No pneumothorax is identified. IMPRESSION: 1. Endotracheal tube now terminates less than 1 cm above the carina near the right mainstem bronchus orifice. Recommend retracting 2-3 cm. 2. Bilateral interstitial and airspace opacities with worsening aeration of the lung bases which may reflect edema or pneumonia along with enlarging pleural effusions. Electronically Signed   By: Logan Bores M.D.   On: 07/21/2019 17:38   ECHOCARDIOGRAM COMPLETE  Result Date: 07/20/2019   ECHOCARDIOGRAM REPORT   Patient  Name:   JULIONNA MARCZAK Date of Exam: 07/20/2019 Medical Rec #:  [id number]        Height:  62.0 in Accession #:    [id number]       Weight:       140.0 lb Date of Birth:  02-02-1946        BSA:          1.64 m Patient Age:    74 years         BP:           120/46 mmHg Patient Gender: F                HR:           91 bpm. Exam Location:  [facility] Procedure: 2D Echo, Cardiac Doppler and Color Doppler Indications:     CHF 428.21  History:         Patient has prior history of Echocardiogram examinations. CHF,                  COPD; Risk Factors:Hypertension.  Sonographer:     Alyse Low Roar Referring Phys:  [id number] Ottie Glazier Diagnosing Phys: Ida Rogue MD IMPRESSIONS  1. Left ventricular ejection fraction, by visual estimation, is 25 to 30%. The left ventricle has severely decreased function. There is no left ventricular hypertrophy.  2. Left ventricular diastolic parameters are consistent with Grade II diastolic dysfunction (pseudonormalization).  3. Mild to moderately dilated left ventricular internal cavity size.  4. The left ventricle demonstrates global hypokinesis, severe hypokinesis of the anterior and anteroseptal wall.  5. Global right ventricle has low normal systolic function.The right ventricular size is normal. No increase in right ventricular wall thickness.  6. Left atrial size was mildly dilated.  7. Mildly elevated pulmonary artery systolic pressure.  8. Pleural effusion noted on the left, estimated at 5 cm FINDINGS  Left Ventricle: Left ventricular ejection fraction, by visual estimation, is 25 to 30%. The left ventricle has severely decreased function. The left ventricle demonstrates global hypokinesis. The left ventricular internal cavity size was mildly to moderately dilated left ventricle. There is no left ventricular hypertrophy. Left ventricular diastolic parameters are consistent with Grade II diastolic dysfunction (pseudonormalization). Normal left atrial pressure. Right Ventricle: The  right ventricular size is normal. No increase in right ventricular wall thickness. Global RV systolic function is has low normal systolic function. The tricuspid regurgitant velocity is 2.57 m/s, and with an assumed right atrial pressure of 10 mmHg, the estimated right ventricular systolic pressure is mildly elevated at 36.4 mmHg. Left Atrium: Left atrial size was mildly dilated. Right Atrium: Right atrial size was normal in size Pericardium: There is no evidence of pericardial effusion. Mitral Valve: The mitral valve is normal in structure. Mild mitral valve regurgitation. No evidence of mitral valve stenosis by observation. Tricuspid Valve: The tricuspid valve is normal in structure. Tricuspid valve regurgitation is mild-moderate. Aortic Valve: The aortic valve was not well visualized. Aortic valve regurgitation is not visualized. The aortic valve is structurally normal, with no evidence of sclerosis or stenosis. Aortic valve mean gradient measures 4.0 mmHg. Aortic valve peak gradient measures 6.5 mmHg. Aortic valve area, by VTI measures 1.86 cm. Pulmonic Valve: The pulmonic valve was normal in structure. Pulmonic valve regurgitation is not visualized. Pulmonic regurgitation is not visualized. Aorta: The aortic root, ascending aorta and aortic arch are all structurally normal, with no evidence of dilitation or obstruction. Venous: The inferior vena cava is normal in size with greater than 50% respiratory variability, suggesting right atrial pressure of 3 mmHg. IAS/Shunts: No atrial level shunt detected by color flow Doppler.  There is no evidence of a patent foramen ovale. No ventricular septal defect is seen or detected. There is no evidence of an atrial septal defect.  LEFT VENTRICLE PLAX 2D LVIDd:         4.89 cm       Diastology LVIDs:         4.22 cm       LV e' lateral:   9.16 cm/s LV PW:         1.05 cm       LV E/e' lateral: 11.7 LV IVS:        0.93 cm       LV e' medial:    4.38 cm/s LVOT diam:     1.70  cm       LV E/e' medial:  24.4 LV SV:         33 ml LV SV Index:   19.52 LVOT Area:     2.27 cm  LV Volumes (MOD) LV area d, A2C:    29.50 cm LV area d, A4C:    28.30 cm LV area s, A2C:    23.30 cm LV area s, A4C:    23.00 cm LV major d, A2C:   6.78 cm LV major d, A4C:   6.69 cm LV major s, A2C:   6.21 cm LV major s, A4C:   6.51 cm LV vol d, MOD A2C: 103.0 ml LV vol d, MOD A4C: 98.0 ml LV vol s, MOD A2C: 71.8 ml LV vol s, MOD A4C: 67.0 ml LV SV MOD A2C:     31.2 ml LV SV MOD A4C:     98.0 ml LV SV MOD BP:      30.2 ml RIGHT VENTRICLE RV Mid diam:    3.15 cm TAPSE (M-mode): 1.2 cm LEFT ATRIUM             Index       RIGHT ATRIUM           Index LA diam:        4.30 cm 2.62 cm/m  RA Area:     10.70 cm LA Vol (A2C):   53.1 ml 32.32 ml/m RA Volume:   19.90 ml  12.11 ml/m LA Vol (A4C):   40.2 ml 24.47 ml/m LA Biplane Vol: 47.3 ml 28.79 ml/m  AORTIC VALVE                   PULMONIC VALVE AV Area (Vmax):    2.09 cm    PV Vmax:        1.09 m/s AV Area (Vmean):   1.96 cm    PV Peak grad:   4.8 mmHg AV Area (VTI):     1.86 cm    RVOT Peak grad: 2 mmHg AV Vmax:           127.00 cm/s AV Vmean:          94.300 cm/s AV VTI:            0.238 m AV Peak Grad:      6.5 mmHg AV Mean Grad:      4.0 mmHg LVOT Vmax:         117.00 cm/s LVOT Vmean:        81.300 cm/s LVOT VTI:          0.195 m LVOT/AV VTI ratio: 0.82  AORTA Ao Root diam: 2.70 cm MITRAL VALVE  TRICUSPID VALVE MV Area (PHT): 5.62 cm             TR Peak grad:   26.4 mmHg MV PHT:        39.15 msec           TR Vmax:        257.00 cm/s MV Decel Time: 135 msec MV E velocity: 107.00 cm/s 103 cm/s SHUNTS                                     Systemic VTI:  0.20 m                                     Systemic Diam: 1.70 cm  Ida Rogue MD Electronically signed by Ida Rogue MD Signature Date/Time: 07/20/2019/1:28:48 PM    Final      Medications:   .  prismasol BGK 4/2.5 750 mL/hr at 07/22/19 0514  .  prismasol BGK 4/2.5 200 mL/hr at  07/21/19 2200  . sodium chloride 10 mL/hr at 07/20/19 2045  . amiodarone 30 mg/hr (07/22/19 0500)  . ampicillin-sulbactam (UNASYN) IV    . dexmedetomidine (PRECEDEX) IV infusion 0.8 mcg/kg/hr (07/22/19 0658)  . fentaNYL infusion INTRAVENOUS Stopped (07/22/19 1038)  . heparin 750 Units/hr (07/22/19 0500)  . norepinephrine (LEVOPHED) Adult infusion 18 mcg/min (07/22/19 0500)  . prismasol BGK 4/2.5 1,500 mL/hr at 07/22/19 0852   . atorvastatin  40 mg Per Tube q1800  . chlorhexidine gluconate (MEDLINE KIT)  15 mL Mouth Rinse BID  . Chlorhexidine Gluconate Cloth  6 each Topical Daily  . ezetimibe  10 mg Per Tube Daily  . famotidine  20 mg Per Tube QHS  . insulin aspart  0-6 Units Subcutaneous Q4H  . insulin glargine  10 Units Subcutaneous BID  . ipratropium-albuterol  3 mL Nebulization Q6H  . mouth rinse  15 mL Mouth Rinse 10 times per day  . methylPREDNISolone (SOLU-MEDROL) injection  40 mg Intravenous Q24H  . midodrine  10 mg Oral TID WC  . senna-docusate  2 tablet Per Tube BID   albuterol, fentaNYL, heparin, midazolam, midazolam, morphine injection, phenol  Assessment/ Plan:  74 y.o. female with a PMHx of hypertension, myocardial infarction, CABG and MRA of 2020, diabetes mellitus type 2, COPD on chronic home oxygen, chronic diastolic heart failure, chronic kidney disease stage III baseline creatinine 1.6-1.9, COVID-19 infection 05/15/2019, who was admitted to [facility] on 07/20/2019 for evaluation of increasing shortness of breath, cough, fatigue.   1.  Acute kidney injury likely secondary to acute tubular necrosis/chronic kidney disease stage IIIb baseline creatinine 1.6-1.9.  Urine output was only 230 cc over the preceding 24 hours.  Patient was initiated on CRRT.  We will maintain the patient on CRRT with patient fluid removal of net even target for now.  Continue to monitor serum electrolytes.  2.  Acute respiratory failure.  Maintain current ventilatory support.  FiO2 currently  50%.  3.  Anemia unspecified.  Hemoglobin down to 7.0.  Consider blood transfusion but defer to primary team.  Plan:   LOS: 4 Bowe Sidor 1/26/202110:39 AM

## 2019-07-22 NOTE — Consult Note (Signed)
[place] for Heparin Indication: atrial fibrillation  Allergies  Allergen Reactions  . Metformin And Related Diarrhea  . Sulfur Hives    Patient Measurements: Height: 5\' 2"  (157.5 cm) Weight: 158 lb 4.6 oz (71.8 kg) IBW/kg (Calculated) : 50.1 Heparin Dosing Weight:  71.8 kg (actual body weight)  Vital Signs: Temp: 97.8 F (36.6 C) (01/26 0800) Temp Source: Oral (01/26 0800) BP: 121/57 (01/26 1000) Pulse Rate: 96 (01/26 1000)  Labs: Recent Labs    07/19/19 1711 07/20/19 0500 07/20/19 0500 07/20/19 1052 07/20/19 1314 07/21/19 0446 07/21/19 0446 07/21/19 0930 07/21/19 1246 07/22/19 0132 07/22/19 0408 07/22/19 0955  HGB  --  7.3*   < >  --   --  6.9*   < > 7.3*  --   --  7.0*  --   HCT  --  23.5*   < >  --   --  22.9*  --  23.7*  --   --  23.0*  --   PLT  --  384  --   --   --  349  --   --   --   --  565*  --   APTT  --   --   --   --   --   --   --   --   --  >160*  --  157*  LABPROT  --   --   --   --   --   --   --   --  30.3*  --   --   --   INR  --   --   --   --   --   --   --   --  2.9*  --   --   --   CREATININE  --  2.00*  --   --   --  2.19*  --   --   --   --  1.60*  --   TROPONINIHS 3,009*  --   --  1,404* 1,502*  --   --   --   --   --   --   --    < > = values in this interval not displayed.    Estimated Creatinine Clearance: 29.5 mL/min (A) (by C-G formula based on SCr of 1.6 mg/dL (H)).   Medical History: Past Medical History:  Diagnosis Date  . Arrhythmia    atrial fibrillation  . CHF (congestive heart failure) ([place])   . COPD (chronic obstructive pulmonary disease) ([place])   . Diabetes mellitus without complication ([place])   . Heart attack ([place])   . Hypertension   . Renal disorder     Assessment: 74 year-old female admitted with COVID 19 pneumonitis, severe COPD, and multi-organ failure with NSTEMI and atrial fibrillation rate-controlled with amiodarone.   Cardiac history is significant for recent CABG  (07/2018) and prior pulmonary embolism.  She is currently on apixaban at home for chronic atrial fibrillation.  Last inpatient dose of apixaban 1/24 @ 2200.  Will continue to monitor hemoglobin, which is low in the setting of CKD.  On 1/25, patient was intubated and received central line.  She is receiving CRRT currently.  Goal of Therapy:  Heparin level 0.3-0.7 units/ml Monitor platelets by anticoagulation protocol: Yes   Plan:  0126 @ 0955 APTT 157.  Hemoglobin low but seems stable.  Currently no signs of bleeding per AM ICU rounds.  Spoke with RN, will  hold heparin for 1 hour, and resume at lower rate, decreasing to 600 units/hr.  Will order 8-hour APTT for 1/26 at 2000, and monitor CBC with AM labs.  Gerald Dexter, PharmD 07/22/2019 11:15 AM

## 2019-07-23 DIAGNOSIS — I480 Paroxysmal atrial fibrillation: Secondary | ICD-10-CM

## 2019-07-23 DIAGNOSIS — I5023 Acute on chronic systolic (congestive) heart failure: Secondary | ICD-10-CM

## 2019-07-23 LAB — CULTURE, BLOOD (ROUTINE X 2)
Culture: NO GROWTH
Culture: NO GROWTH
Special Requests: ADEQUATE
Special Requests: ADEQUATE

## 2019-07-23 LAB — BPAM RBC
Blood Product Expiration Date: 202101302359
ISSUE DATE / TIME: 202101261609
Unit Type and Rh: 600

## 2019-07-23 LAB — GLUCOSE, CAPILLARY
Glucose-Capillary: 124 mg/dL — ABNORMAL HIGH (ref 70–99)
Glucose-Capillary: 126 mg/dL — ABNORMAL HIGH (ref 70–99)
Glucose-Capillary: 128 mg/dL — ABNORMAL HIGH (ref 70–99)
Glucose-Capillary: 133 mg/dL — ABNORMAL HIGH (ref 70–99)
Glucose-Capillary: 141 mg/dL — ABNORMAL HIGH (ref 70–99)
Glucose-Capillary: 152 mg/dL — ABNORMAL HIGH (ref 70–99)
Glucose-Capillary: 157 mg/dL — ABNORMAL HIGH (ref 70–99)
Glucose-Capillary: 160 mg/dL — ABNORMAL HIGH (ref 70–99)
Glucose-Capillary: 183 mg/dL — ABNORMAL HIGH (ref 70–99)
Glucose-Capillary: 185 mg/dL — ABNORMAL HIGH (ref 70–99)
Glucose-Capillary: 214 mg/dL — ABNORMAL HIGH (ref 70–99)
Glucose-Capillary: 298 mg/dL — ABNORMAL HIGH (ref 70–99)
Glucose-Capillary: 299 mg/dL — ABNORMAL HIGH (ref 70–99)
Glucose-Capillary: 306 mg/dL — ABNORMAL HIGH (ref 70–99)
Glucose-Capillary: 310 mg/dL — ABNORMAL HIGH (ref 70–99)

## 2019-07-23 LAB — TYPE AND SCREEN
ABO/RH(D): A POS
Antibody Screen: NEGATIVE
Unit division: 0

## 2019-07-23 LAB — RENAL FUNCTION PANEL
Albumin: 2.5 g/dL — ABNORMAL LOW (ref 3.5–5.0)
Albumin: 2.4 g/dL — ABNORMAL LOW (ref 3.5–5.0)
Anion gap: 11 (ref 5–15)
Anion gap: 8 (ref 5–15)
BUN: 26 mg/dL — ABNORMAL HIGH (ref 8–23)
BUN: 20 mg/dL (ref 8–23)
CO2: 24 mmol/L (ref 22–32)
CO2: 27 mmol/L (ref 22–32)
Calcium: 8 mg/dL — ABNORMAL LOW (ref 8.9–10.3)
Calcium: 8 mg/dL — ABNORMAL LOW (ref 8.9–10.3)
Chloride: 101 mmol/L (ref 98–111)
Chloride: 101 mmol/L (ref 98–111)
Creatinine, Ser: 0.99 mg/dL (ref 0.44–1.00)
Creatinine, Ser: 0.81 mg/dL (ref 0.44–1.00)
GFR calc Af Amer: >60 mL/min (ref >60)
GFR calc Af Amer: >60 mL/min (ref >60)
GFR calc non Af Amer: 57 mL/min — ABNORMAL LOW (ref >60)
GFR calc non Af Amer: >60 mL/min (ref >60)
Glucose, Bld: 323 mg/dL — ABNORMAL HIGH (ref 70–99)
Glucose, Bld: 156 mg/dL — ABNORMAL HIGH (ref 70–99)
Phosphorus: 2.3 mg/dL — ABNORMAL LOW (ref 2.5–4.6)
Phosphorus: 3.2 mg/dL (ref 2.5–4.6)
Potassium: 4.8 mmol/L (ref 3.5–5.1)
Potassium: 4.3 mmol/L (ref 3.5–5.1)
Sodium: 136 mmol/L (ref 135–145)
Sodium: 136 mmol/L (ref 135–145)

## 2019-07-23 LAB — CBC
HCT: 29.4 % — ABNORMAL LOW (ref 36.0–46.0)
Hemoglobin: 8.9 g/dL — ABNORMAL LOW (ref 12.0–15.0)
MCH: 28.4 pg (ref 26.0–34.0)
MCHC: 30.3 g/dL (ref 30.0–36.0)
MCV: 93.9 fL (ref 80.0–100.0)
Platelets: 460 10*3/uL — ABNORMAL HIGH (ref 150–400)
RBC: 3.13 MIL/uL — ABNORMAL LOW (ref 3.87–5.11)
RDW: 17.7 % — ABNORMAL HIGH (ref 11.5–15.5)
WBC: 17.8 10*3/uL — ABNORMAL HIGH (ref 4.0–10.5)
nRBC: 2.7 % — ABNORMAL HIGH (ref 0.0–0.2)

## 2019-07-23 LAB — APTT
aPTT: 103 seconds — ABNORMAL HIGH (ref 24–36)
aPTT: 63 seconds — ABNORMAL HIGH (ref 24–36)
aPTT: 64 seconds — ABNORMAL HIGH (ref 24–36)

## 2019-07-23 LAB — HEPARIN LEVEL (UNFRACTIONATED): Heparin Unfractionated: >3.6 IU/mL — ABNORMAL HIGH (ref 0.30–0.70)

## 2019-07-23 LAB — MAGNESIUM: Magnesium: 2.5 mg/dL — ABNORMAL HIGH (ref 1.7–2.4)

## 2019-07-23 MED ORDER — POLYETHYLENE GLYCOL 3350 17 G PO PACK
17.0000 g | PACK | Freq: Every day | ORAL | Status: DC
  Administered 2019-07-23 – 2019-07-24 (×2): 17 g
  Filled 2019-07-23 (×2): qty 1

## 2019-07-23 MED ORDER — INSULIN GLARGINE 100 UNIT/ML ~~LOC~~ SOLN
12.0000 [IU] | Freq: Two times a day (BID) | SUBCUTANEOUS | Status: DC
  Administered 2019-07-23: 12 [IU] via SUBCUTANEOUS
  Filled 2019-07-23 (×2): qty 0.12

## 2019-07-23 MED ORDER — INSULIN ASPART 100 UNIT/ML ~~LOC~~ SOLN
3.0000 [IU] | SUBCUTANEOUS | Status: DC
  Administered 2019-07-23: 23:00:00 6 [IU] via SUBCUTANEOUS
  Administered 2019-07-23: 20:00:00 3 [IU] via SUBCUTANEOUS
  Administered 2019-07-24: 12:00:00 6 [IU] via SUBCUTANEOUS
  Administered 2019-07-24: 9 [IU] via SUBCUTANEOUS
  Administered 2019-07-24: 6 [IU] via SUBCUTANEOUS
  Filled 2019-07-23 (×5): qty 1

## 2019-07-23 MED ORDER — INSULIN ASPART 100 UNIT/ML ~~LOC~~ SOLN
0.0000 [IU] | SUBCUTANEOUS | Status: DC
  Administered 2019-07-23: 11 [IU] via SUBCUTANEOUS
  Filled 2019-07-23: qty 1

## 2019-07-23 MED ORDER — DEXTROSE 10 % IV SOLN: INTRAVENOUS | Status: DC | PRN

## 2019-07-23 MED ORDER — INSULIN ASPART 100 UNIT/ML ~~LOC~~ SOLN
2.0000 [IU] | SUBCUTANEOUS | Status: DC
  Administered 2019-07-23 – 2019-07-24 (×5): 2 [IU] via SUBCUTANEOUS
  Filled 2019-07-23 (×5): qty 1

## 2019-07-23 MED ORDER — SODIUM PHOSPHATES 45 MMOLE/15ML IV SOLN
20.0000 mmol | Freq: Once | INTRAVENOUS | Status: AC
  Administered 2019-07-23: 10:00:00 20 mmol via INTRAVENOUS
  Filled 2019-07-23: qty 6.67

## 2019-07-23 MED ORDER — INSULIN ASPART 100 UNIT/ML ~~LOC~~ SOLN: 0.0000 [IU] | SUBCUTANEOUS | Status: DC

## 2019-07-23 MED ORDER — INSULIN REGULAR(HUMAN) IN NACL 100-0.9 UT/100ML-% IV SOLN
INTRAVENOUS | Status: DC
  Administered 2019-07-23: 15 [IU]/h via INTRAVENOUS

## 2019-07-23 MED ORDER — DEXTROSE-NACL 5-0.45 % IV SOLN: INTRAVENOUS | Status: DC

## 2019-07-23 MED ORDER — HEPARIN (PORCINE) 25000 UT/250ML-% IV SOLN: 600.0000 [IU]/h | INTRAVENOUS | Status: DC

## 2019-07-23 MED ORDER — DEXTROSE 50 % IV SOLN: 0.0000 mL | INTRAVENOUS | Status: DC | PRN

## 2019-07-23 MED ORDER — HYDROCORTISONE NA SUCCINATE PF 100 MG IJ SOLR
50.0000 mg | Freq: Four times a day (QID) | INTRAMUSCULAR | Status: DC
  Administered 2019-07-23 – 2019-07-26 (×13): 50 mg via INTRAVENOUS
  Filled 2019-07-23 (×13): qty 2

## 2019-07-23 MED ORDER — INSULIN DETEMIR 100 UNIT/ML ~~LOC~~ SOLN
8.0000 [IU] | Freq: Two times a day (BID) | SUBCUTANEOUS | Status: DC
  Administered 2019-07-23 – 2019-07-24 (×2): 8 [IU] via SUBCUTANEOUS
  Filled 2019-07-23 (×3): qty 0.08

## 2019-07-23 NOTE — Consult Note (Signed)
[place] for Heparin Indication: atrial fibrillation  Allergies  Allergen Reactions  . Metformin And Related Diarrhea  . Sulfur Hives    Patient Measurements: Height: 5\' 2"  (157.5 cm) Weight: 160 lb 15 oz (73 kg) IBW/kg (Calculated) : 50.1 Heparin Dosing Weight:  71.8 kg (actual body weight)  Vital Signs: Temp: 98.4 F (36.9 C) (01/27 1926) Temp Source: Axillary (01/27 1926) BP: 114/49 (01/27 2200) Pulse Rate: 96 (01/27 2200)  Labs: Recent Labs    07/21/19 1246 07/22/19 0132 07/22/19 0408 07/22/19 0408 07/22/19 0955 07/22/19 1600 07/22/19 1942 07/22/19 1942 07/23/19 0512 07/23/19 1216 07/23/19 1737 07/23/19 2130  HGB  --   --  7.0*   < >  --   --  8.9*  --  8.9*  --   --   --   HCT  --   --  23.0*  --   --   --  28.4*  --  29.4*  --   --   --   PLT  --   --  565*  --   --   --  566*  --  460*  --   --   --   APTT  --    < >  --    < >   < >  --  106*   < > 103* 63*  --  64*  LABPROT 30.3*  --   --   --   --   --   --   --   --   --   --   --   INR 2.9*  --   --   --   --   --   --   --   --   --   --   --   HEPARINUNFRC  --   --   --   --   --   --   --   --  >3.60*  --   --   --   CREATININE  --   --  1.60*  --   --  1.02*  --   --  0.99  --  0.81  --    < > = values in this interval not displayed.    Estimated Creatinine Clearance: 58.8 mL/min (by C-G formula based on SCr of 0.81 mg/dL).   Medical History: Past Medical History:  Diagnosis Date  . Arrhythmia    atrial fibrillation  . CHF (congestive heart failure) ([place])   . COPD (chronic obstructive pulmonary disease) ([place])   . Diabetes mellitus without complication ([place])   . Heart attack ([place])   . Hypertension   . Renal disorder     Assessment: 74 year-old female admitted with COVID 19 pneumonitis, severe COPD, and multi-organ failure with NSTEMI and atrial fibrillation rate-controlled with amiodarone.   Cardiac history is significant for recent CABG (07/2018)  and prior pulmonary embolism.  She is currently on apixaban at home for chronic atrial fibrillation.  Last inpatient dose of apixaban 1/24 @ 2200.  Will continue to monitor hemoglobin, which is low in the setting of CKD.  Patient received transfusion 1/26, returning hemoglobin to 8.9.  Baseline Hgb appears to be 8-8.4.  Patient is intubated with a central line.  She is receiving CRRT currently.    Goal of Therapy:  Goal APTT level is 66-102 seconds. Heparin level 0.3-0.7 units/ml  Monitor platelets by anticoagulation protocol: Yes   Plan:  01/27 @  2200 aPTT 64 seconds therapeutic. Will continue current rate and will recheck aPTT at 0300 and continue to monitor.  Tobie Lords, PharmD 07/23/2019 11:18 PM

## 2019-07-23 NOTE — Progress Notes (Signed)
Palliative:  HPI: 74 y.o.femalewith past medical history of COPD, CHF, atrial fibrillation, CAD s/p CABG 2020, h/o MI, h/o PE, hypertension, CKD stage 3, diabetes, breast cancer in remissionadmitted on 1/22/2021with shortness of breath.Hospitalization 3 times over Nov-Dec 2020 with COVID pneumonia. Reportedly had steady improvement until ~3 days prior to this admission when she became more short of breath and with dry cough and diagnosed with multifocal pneumonia. Acute decompensation and required intubation 1/23 and was extubated 1/24 but now with increased work of breathing and requiring BiPAP. Continues to be high risk for re-intubation with COVID pneumonitis and severe COPD exacerbation.07/21/19 Re-intubated after my discussion and visit and no 07/22/19 on CRRT and ven support as well as requiring vasopressor support and amiodarone infusion.    I met today at Andrea [name]'s bedside with daughter, Lattie Haw. Andrea [name] is sedated but does still respond at times (although in small ways with facial expression during my visit). Spent some time discussing with Lattie Haw her mother's status which is stable. She continues to require amiodarone and vasopressor supports. Her hemaglobin is improved after blood transfusion yesterday. Provided therapeutic listening and emotional support.   Discussed with chaplain resident family request for daily visits for scripture and prayer as this will be comforting to Andrea [name].   I called and spoke with daughter, [name]. I gave her update and answered questions. They continue to be hopeful for improvement. [name] shares with me her mother's faith and belief and shares that her mother speaks of having a message from God that He would carry her through. This is part of the reason she has fought so hard and requested full aggressive care as this was a sign to her that she would recover.   [name] does share that at this time they have decided to remain full code to respect Andrea [name]'s  wishes. However, they do understand that with no desire for tracheostomy that this will change once she is extubated.   All questions/concerns addressed. Emotional support provided. Discussed with Dr. Mortimer Fries.   Exam: Sedated on vent. No distress. Skin pale. Breathing synchronous with vent. HR 90s with amiodarone infusion. Abd soft. Warm to touch.   Plan: - Continue to optimize and continue full aggressive care with hopes of improvement and successful extubation.  - Plans for DNR after extubation as they do not desire tracheostomy.   [place], NP Palliative Medicine Team Pager [phone] (Please see [email or web address] for schedule) Team Phone [phone]    Greater than 50%  of this time was spent counseling and coordinating care related to the above assessment and plan

## 2019-07-23 NOTE — Progress Notes (Signed)
Pt awake and following commands at this time, heparin restarted per Dr Mortimer Fries, SCDs placed.  Pt nods and shakes head appropriately, blinks eyes for "yes"

## 2019-07-23 NOTE — Progress Notes (Addendum)
Pt significantly less responsive this afternoon, no cough/gag.  Responding to deep nail pressure only.  All sedation has been turned off.   Dr Mortimer Fries notified, we will turn off the heparin for now, head CT not safe for the patient at this time

## 2019-07-23 NOTE — Progress Notes (Signed)
Andrea [name] visited pt. per OR for pastoral visit and per referral from palliative care RN.  Pt. intubated and sedated when Andrea [name] entered; RN @ bedside; dtr. Andrea [name]) also @ bedside.  In extended visit, Andrea [name] discussed pt.'s family w/dtr.  Family appears to be supporting one another well; making decisions together and aware of pt.'s condition.  Family significantly supported by their Andrea [name]; pt. enjoys Andrea [name] worship music and Andrea [name].   Pt. awoke towards end of visit as RN reportedly lowered sedation meds; pt. appeared somewhat uncomfortable --> meds adjusted by RN; dtr. requested prayer @ bedside.  Andrea [name] read Psalm 23 and prayed for pt.'s peace, wellbeing, and for skilled treatment by medical team.  Andrea [name] joined visit midway through and made pt. and dtr. aware of her availability tonight.  Andrea [name] will follow up tomorrow if possible and coordinate w/other ICU chaplains to support pt./family.  No further needs expressed @ this time.       07/23/19 1530  Clinical Encounter Type  Visited With Patient and family together  Visit Type Follow-up;Spiritual support;Psychological support;Social support;Critical Care  Referral From Palliative care team  Consult/Referral To Chaplain  Recommendations  (family has requested daily visit/prayer/scripture)  Spiritual Encounters  Spiritual Needs Prayer;Emotional  Stress Factors  Patient Stress Factors Health changes;Loss of control;Major life changes  Family Stress Factors Health changes;Major life changes

## 2019-07-23 NOTE — Consult Note (Signed)
PHARMACY CONSULT NOTE - FOLLOW UP  Pharmacy Consult for Electrolyte Monitoring and Replacement   Andrea [name] is a 74 y.o. female admitted on 06/29/2019 with acute on chronic renal failure with hyperkalemia, elevated troponin, and acute on chronic hypoxic hypercapnic respiratory failure secondary to bacterial pneumonia vs COVID-19 pneumonia. PMH includes MI, Afib, HTN, CABG and MRA of 2020, T2DM, COPD on chronic home oxygen, chronic diastolic heart failure, CKD Stage III, COVID-19 (tested positive 04/2019). Pt is intubated on fentanyl IV. Pt has also started CRRT 01/25. Per am rounds, insulin IV (Myxredlin) initiated today while [place] forms of insulins were D/C'ed. Solu-medrol was also D/C'ed today and changed to hydrocortisone IV 50mg  Q6H. Pharmacy consulted to assist with monitoring and replacing electrolytes.    Recent Labs: Potassium (mmol/L)  Date Value  07/23/2019 4.8   Magnesium (mg/dL)  Date Value  07/23/2019 2.5 (H)   Calcium (mg/dL)  Date Value  07/23/2019 8.0 (L)   Albumin (g/dL)  Date Value  07/23/2019 2.5 (L)   Phosphorus (mg/dL)  Date Value  07/23/2019 2.3 (L)   Sodium (mmol/L)  Date Value  07/23/2019 136     Assessment: 1. Electrolytes: Electrolytes are WNL, except for phosphorus and magnesium. Corrected calcium is 9.2 (WNL). Patient is on CRRT. Phosphorus levels have decreased since yesterday. Pt received sodium phosphate 20 mmol IV x 1 dose today per nephrology. Will defer electrolytes monitoring to nephrology. Continue monitoring electrolytes in am labs. Will replace to achieve potassium goal ~4, sodium ~140, magnesium ~2.   2. Glucose: Today's range: 157-323. Glucose-capillary monitored Q4H. Pt is on insulin infusion IV (Myxredlin 100 units/16mL). Pt is on hydrocortisone 50mg  IV Q6H. Continue insulin administrations and monitor glucose levels daily.   3. Constipation: Last BM: Prior to admission. Continue senna-docusate 2 tabs per tube BID while pt is on  continuous fentanyl infusion. Miralax 17g daily was also initiated today.    Roanna Banning ,PharmD Candidate 07/23/2019 1:59 PM

## 2019-07-23 NOTE — Progress Notes (Signed)
Inpatient Diabetes Program Recommendations  AACE/ADA: New Consensus Statement on Inpatient Glycemic Control (2015)  Target Ranges:  Prepandial:   less than 140 mg/dL      Peak postprandial:   less than 180 mg/dL (1-2 hours)      Critically ill patients:  140 - 180 mg/dL   Lab Results  Component Value Date   GLUCAP 306 (H) 07/23/2019   HGBA1C 7.9 (H) 07/17/2019    Review of Glycemic Control Results for Andrea [name], Andrea [name] (MRN [id number]) as of 07/23/2019 10:35  Ref. Range 07/22/2019 11:39 07/22/2019 20:12 07/23/2019 00:35 07/23/2019 03:34 07/23/2019 07:23  Glucose-Capillary Latest Ref Range: 70 - 99 mg/dL 169 (H) 198 (H) 298 (H) 299 (H) 306 (H)    Inpatient Diabetes Program Recommendations:   Please consider: ICU order set phase 2 with IV insulin -or Add Novolog 3-4 units q 4 hrs.(hold if tube feeding held or stopped for any reason)  Thank you, Bethena Roys E. Tangie Stay, RN, MSN, CDE  Diabetes Coordinator Inpatient Glycemic Control Team Team Pager [phone] (8am-5pm) 07/23/2019 10:39 AM

## 2019-07-23 NOTE — Consult Note (Signed)
Pharmacy has been consulted to monitor medication adjustments for CRRT.  No adjustments needed at this time.   Gerald Dexter, PharmD 07/23/2019 9:00 AM

## 2019-07-23 NOTE — Progress Notes (Signed)
   07/23/19 1600  Clinical Encounter Type  Visited With Patient and family together  Visit Type Initial  Referral From Nurse  Consult/Referral To Chaplain  Chaplain visited patient per OR. Upon arrival, Chaplain Gerald Stabs was talking with daughter, Lattie Haw. Chaplain Hodan Wurtz talked with Lattie Haw and Jill Poling for 20 minutes. Chaplain offered pastoral presence and empathy.

## 2019-07-23 NOTE — Consult Note (Addendum)
[place] for Heparin Indication: atrial fibrillation  Allergies  Allergen Reactions  . Metformin And Related Diarrhea  . Sulfur Hives    Patient Measurements: Height: 5\' 2"  (157.5 cm) Weight: 160 lb 15 oz (73 kg) IBW/kg (Calculated) : 50.1 Heparin Dosing Weight:  71.8 kg (actual body weight)  Vital Signs: Temp: 98.3 F (36.8 C) (01/27 1200) Temp Source: Axillary (01/27 1200) BP: 118/55 (01/27 1500) Pulse Rate: 98 (01/27 1500)  Labs: Recent Labs    07/21/19 1246 07/22/19 0132 07/22/19 0408 07/22/19 0408 07/22/19 0955 07/22/19 1600 07/22/19 1942 07/23/19 0512 07/23/19 1216  HGB  --   --  7.0*   < >  --   --  8.9* 8.9*  --   HCT  --   --  23.0*  --   --   --  28.4* 29.4*  --   PLT  --   --  565*  --   --   --  566* 460*  --   APTT  --    < >  --    < >   < >  --  106* 103* 63*  LABPROT 30.3*  --   --   --   --   --   --   --   --   INR 2.9*  --   --   --   --   --   --   --   --   HEPARINUNFRC  --   --   --   --   --   --   --  >3.60*  --   CREATININE  --   --  1.60*  --   --  1.02*  --  0.99  --    < > = values in this interval not displayed.    Estimated Creatinine Clearance: 48.1 mL/min (by C-G formula based on SCr of 0.99 mg/dL).   Medical History: Past Medical History:  Diagnosis Date  . Arrhythmia    atrial fibrillation  . CHF (congestive heart failure) ([place])   . COPD (chronic obstructive pulmonary disease) ([place])   . Diabetes mellitus without complication ([place])   . Heart attack ([place])   . Hypertension   . Renal disorder     Assessment: 74 year-old female admitted with COVID 19 pneumonitis, severe COPD, and multi-organ failure with NSTEMI and atrial fibrillation rate-controlled with amiodarone.   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  She is currently on apixaban at home for chronic atrial fibrillation.  Last inpatient dose of apixaban 1/24 @ 2200.  Will continue to  monitor hemoglobin, which is low in the setting of CKD.  Patient received transfusion 1/26, returning hemoglobin to 8.9.  Baseline Hgb appears to be 8-8.4.  Patient is intubated with a central line.  She is receiving CRRT currently.    Goal of Therapy:  Goal APTT level is 66-102 seconds. Heparin level 0.3-0.7 units/ml  Monitor platelets by anticoagulation protocol: Yes   Plan:  0127 1216 APTT 63, subtherapeutic.  Heparin rate increased to 550 units/hr, but stopped shortly after as patient became unresponsive.  Per RN, heparin held for ~1 hour before resuming at 550 units/hr.  Will check APTT in ~8 hours at 2200.  Will follow APTT until APTT correlates with Anti-Xa.  Anti-Xa currently elevated > 3.6.  Will hold off checking Anti-Xa for now, as patient has poor renal clearance since last apixaban dose.  Check CBC with AM labs.    Gerald Dexter, PharmD 07/23/2019 3:38 PM

## 2019-07-23 NOTE — Consult Note (Signed)
Pharmacy Antibiotic Note  Andrea [name] is a 74 y.o. female admitted on 07/21/2019 with pneumonia.  Pharmacy has been consulted for Zosyn dosing.  Patient is currently admitted with multiorgan failure with underlying COPD and PMH of COVID-19 pneumonitis in November 2020.  She was discharged home and re-admitted for multifocal pneumonia in December 2020.  Chest X-ray reveals persistent bilateral interstitial and airspace opacities.  PMH is also significant for CHF, CABG, diabetes mellitus type 2, and CKDIII (baseline Scr 1.6-1.9).  On 1/25, the patient was intubated and received central line, and is now receiving CRRT.  She is currently on day 6 of antibiotics.    Plan: Bcx remain negative x 5 days.  Patient is afebrile with elevated WBC likely induced by 6 days of IV steroids.  Per discussion in AM ICU rounds, will stop Zosyn tomorrow.  At that point, patient will have been on total antibiotic therapy for 7 days.    Height: 5\' 2"  (157.5 cm) Weight: 160 lb 15 oz (73 kg) IBW/kg (Calculated) : 50.1  Temp (24hrs), Avg:97.8 F (36.6 C), Min:97.5 F (36.4 C), Max:98.3 F (36.8 C)  Recent Labs  Lab  0000 07/20/2019 2139 07/17/2019 2343 07/19/19 0305 07/20/19 0500 07/21/19 0446 07/22/19 0408 07/22/19 1600 07/22/19 1942 07/23/19 0512  WBC   < > 13.7*  --   --  10.8* 11.8* 19.2*  --  19.7* 17.8*  CREATININE  --  1.78*  --    < > 2.00* 2.19* 1.60* 1.02*  --  0.99  LATICACIDVEN  --  6.2* 2.8*  --   --   --   --   --   --   --    < > = values in this interval not displayed.    Estimated Creatinine Clearance: 48.1 mL/min (by C-G formula based on SCr of 0.99 mg/dL).    Allergies  Allergen Reactions  . Metformin And Related Diarrhea  . Sulfur Hives    Antimicrobials this admission: 1/22 Cefepime >> 1/22 1/23 Levofloxacin >> 1/25 1/22 Vancomycin >> 1/23 1/25 Unasyn >> 1/26 1/26 Zosyn >> 1/28  Dose adjustments this admission: None  Microbiology results: 1/22 BCx: NG x 5 days 1/23  MRSA PCR: negative 1/23 Resp Panel:  Negative  Thank you for allowing pharmacy to be a part of this patient's care.  Gerald Dexter, PharmD 07/23/2019 11:58 AM

## 2019-07-23 NOTE — Progress Notes (Signed)
CRITICAL CARE NOTE  BRIEF PATIENT DESCRIPTION:  74 yo female dx with COVID-19 04/2019 admitted with acute on chronic renal failure with hyperkalemia, elevated troponin, and acute on chronic hypoxic hypercapnic respiratory failure secondary to bacterial pneumonia vs. 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 She presented to [facility] ER via EMS on 01/22 with worsening shortness of breath, cough, and fatigue.  At baseline pt wears chronic home O2 @2L , however due to symptoms she has had to increase her O2 to 4L 24hrs prior to current ER presentation.  En route to the ER EMS placed pt on 6L O2 and administered albuterol.  Upon arrival to the ER pt tachypneic with increased work of breathing requiring iv steroids, duonebs, and HFNC.  Lab results revealed Na+ 133, CO2 21, BUN 38, creatinine 1.78, glucose 300, anion gap 17, troponin 90, lactic acid 6.2, pct 1.11, wbc 13.7, hgb 8.4, and vbg pH 7.38/pCO2 42.  CXR concerning for multifocal pneumonia and small bilateral pleural effusions.  She received solumedrol, vancomycin, and cefepime.  She was subsequently admitted to the stepdown unit per hospitalist team for additional workup and treatment, however remained in the ER pending bed availability.  On 01/23 pt developed worsening acute hypoxic respiratory failure possibly due to flash pulmonary edema after receiving a total of 3L of fluids requiring mechanical intubation.  PCCM contacted to assume care.  Pt does have a hx of COVID-19 diagnosed 05/14/2020, which required hospitalization/treatment at [facility]. Pt discharged home on 05/20/2019.  However, she required hospitalization again at [facility] on  05/31/2019 due to multifocal pneumonia, following treatment she was discharged home on 06/29/2019.       CC  Follow-up respiratory failure  Remains on the ventilator   SUBJECTIVE Patient with multiorgan failure Remains critically ill  prognosis is very poor Remains on pressors Severe cardiogenic shock On CRRT Family at bedside last night updated    BP (!) 121/52   Pulse 96   Temp 97.6 F (36.4 C) (Axillary)   Resp 14   Ht 5' 2"  (1.575 m)   Wt 73 kg   SpO2 94%   BMI 29.44 kg/m    I/O last 3 completed shifts: In: 3507.8 [I.V.:2231.5; Blood:234.5; NG/GT:644.2; IV Piggyback:397.7] Out: 3370 [Urine:120; Emesis/NG output:75; Other:3175] No intake/output data recorded.  SpO2: 94 % O2 Flow Rate (L/min): 4 L/min FiO2 (%): 40 %   SIGNIFICANT EVENTS/STUDIES:  01/22: Pt admitted to the stepdown unit with acute hypoxic respiratory failure secondary to multifocal pneumonia, however remained in the ER pending bed availability 01/23: Pt developed worsening acute hypoxic respiratory failure requiring emergent mechanical intubation in the ER.  PCCM contacted to assume care 1/24 - parameters for SBT met, patient s/p weaning trial with successful liberation from MV.  Updated daughter [name].   1/25 increased WOB and back on biPAP, re-intubated, started on CRRT, family updated multiple times 1/26 severe multiorgan failure, cardiogenic shock, on CRRT, multiple vasopressors 1/27 via multiorgan failure and cardiogenic shock CRRT multiple pressors    REVIEW OF SYSTEMS  PATIENT IS UNABLE TO PROVIDE COMPLETE REVIEW OF SYSTEM S DUE TO SEVERE CRITICAL ILLNESS AND ENCEPHALOPATHY  PHYSICAL EXAMINATION:  GENERAL:critically ill appearing, +resp distress HEAD: Normocephalic, atraumatic.  EYES:  Pupils equal, round, reactive to light.  No scleral icterus.  MOUTH: Moist mucosal membrane. NECK: Supple. No thyromegaly. No nodules. No JVD.  PULMONARY: +rhonchi, +wheezing CARDIOVASCULAR: S1 and  S2. Regular rate and rhythm. No murmurs, rubs, or gallops.  GASTROINTESTINAL: Soft, nontender, -distended. Positive bowel sounds.  MUSCULOSKELETAL: No swelling, clubbing, or edema.  NEUROLOGIC: obtunded SKIN:intact,warm,dry      CULTURE RESULTS   Recent Results (from the past 240 hour(s))  Culture, blood (Routine x 2)     Status: None   Collection Time: 07/27/2019  9:40 PM   Specimen: BLOOD  Result Value Ref Range Status   Specimen Description BLOOD RIGHT ANTECUBITAL  Final   Special Requests   Final    BOTTLES DRAWN AEROBIC AND ANAEROBIC Blood Culture adequate volume   Culture   Final    NO GROWTH 5 DAYS Performed at [facility], [address] [place], [place] [address]    Report Status 07/23/2019 FINAL  Final  Culture, blood (Routine x 2)     Status: None   Collection Time: 06/30/2019  9:40 PM   Specimen: BLOOD  Result Value Ref Range Status   Specimen Description BLOOD LEFT ANTECUBITAL  Final   Special Requests   Final    BOTTLES DRAWN AEROBIC AND ANAEROBIC Blood Culture adequate volume   Culture   Final    NO GROWTH 5 DAYS Performed at [facility], [address] [place], [place] [address]    Report Status 07/23/2019 FINAL  Final  MRSA PCR Screening     Status: None   Collection Time: 07/19/19  6:45 AM   Specimen: Nasal Mucosa; Nasopharyngeal  Result Value Ref Range Status   MRSA by PCR NEGATIVE NEGATIVE Final    Comment:        The GeneXpert MRSA Assay (FDA approved for NASAL specimens only), is one component of a comprehensive MRSA colonization surveillance program. It is not intended to diagnose MRSA infection nor to guide or monitor treatment for MRSA infections. Performed at [facility], [address] [place], [place] [address]   Respiratory Panel by PCR     Status: None   Collection Time: 07/19/19 10:03 AM   Specimen: Nasopharyngeal Swab; Respiratory  Result Value Ref Range Status   Adenovirus NOT DETECTED NOT  DETECTED Final   Coronavirus 229E NOT DETECTED NOT DETECTED Final    Comment: (NOTE) The Coronavirus on the Respiratory Panel, DOES NOT test for the novel  Coronavirus (2019 nCoV)    Coronavirus HKU1 NOT DETECTED NOT DETECTED Final   Coronavirus NL63 NOT DETECTED NOT DETECTED Final   Coronavirus OC43 NOT DETECTED NOT DETECTED Final   Metapneumovirus NOT DETECTED NOT DETECTED Final   Rhinovirus / Enterovirus NOT DETECTED NOT DETECTED Final   Influenza A NOT DETECTED NOT DETECTED Final   Influenza B NOT DETECTED NOT DETECTED Final   Parainfluenza Virus 1 NOT DETECTED NOT DETECTED Final   Parainfluenza Virus 2 NOT DETECTED NOT DETECTED Final   Parainfluenza Virus 3 NOT DETECTED NOT DETECTED Final   Parainfluenza Virus 4 NOT DETECTED NOT DETECTED Final   Respiratory Syncytial Virus NOT DETECTED NOT DETECTED Final   Bordetella pertussis NOT DETECTED NOT DETECTED Final   Chlamydophila pneumoniae NOT DETECTED NOT DETECTED Final   Mycoplasma pneumoniae NOT DETECTED NOT DETECTED Final    Comment: Performed at [facility], [address], [place], [place] [address]          IMAGING    No  results found.  CBC    Component Value Date/Time   WBC 17.8 (H) 07/23/2019 0512   RBC 3.13 (L) 07/23/2019 0512   HGB 8.9 (L) 07/23/2019 0512   HCT 29.4 (L) 07/23/2019 0512   PLT 460 (H) 07/23/2019 0512   MCV 93.9 07/23/2019 0512   MCH 28.4 07/23/2019 0512   MCHC 30.3 07/23/2019 0512   RDW 17.7 (H) 07/23/2019 0512   LYMPHSABS 0.6 (L) 07/21/2019 0446   MONOABS 0.9 07/21/2019 0446   EOSABS 0.0 07/21/2019 0446   BASOSABS 0.0 07/21/2019 0446   BMP Latest Ref Rng & Units 07/23/2019 07/22/2019 07/22/2019  Glucose 70 - 99 mg/dL 323(H) 204(H) 209(H)  BUN 8 - 23 mg/dL 26(H) 27(H) 49(H)  Creatinine 0.44 - 1.00 mg/dL 0.99 1.02(H) 1.60(H)  Sodium 135 - 145 mmol/L 136 136 136  Potassium 3.5 - 5.1 mmol/L 4.8 4.8 4.4  Chloride 98 - 111 mmol/L 101 102 102  CO2 22 - 32 mmol/L 24 25 22   Calcium 8.9 -  10.3 mg/dL 8.0(L) 8.0(L) 8.2(L)       ASSESSMENT AND PLAN SYNOPSIS   Severe ACUTE Hypoxic and Hypercapnic Respiratory Failure COVID 19 pneumonitis and severe COPD exacerbation Severe ACUTE Hypoxic and Hypercapnic Respiratory Failure   Severe ACUTE Hypoxic and Hypercapnic Respiratory Failure -continue Mechanical Ventilator support -continue Bronchodilator Therapy -Wean Fio2 and PEEP as tolerated -VAP/VENT bundle implementation -will perform SAT/SBT when respiratory parameters are met     KIDNEY INJURY/Renal Failure -follow chem 7 -follow UO -continue Foley Catheter-assess need -Avoid nephrotoxic agents CRRT    ACUTE SYSTOLIC CARDIAC FAILURE- EF 25% NSTEMI-on heparin infusion Afib with  RVR on amiodarone   CARDIAC ICU monitoring  ID Cultures   GI GI PROPHYLAXIS as indicated  NUTRITIONAL STATUS DIET-->TF's as tolerated Constipation protocol as indicated  ENDO - ICU hypoglycemic\Hyperglycemia protocol -check FSBS per protocol    ELECTROLYTES -follow labs as needed -replace as needed -pharmacy consultation and following    DVT/GI PRX ordered TRANSFUSIONS AS NEEDED MONITOR FSBS ASSESS the need for LABS as needed       Critical Care Time devoted to patient care services described in this note is 32 minutes.   Overall, patient is critically ill, prognosis is guarded.  Patient with Multiorgan failure and at high risk for cardiac arrest and death.   Patient with very poor chance of meaningful recovery I recommend DNR status and comfort care measures   Maretta Bees Patricia Pesa, M.D.  [facility]  Medical Director [place] Director [facility] Cardio-Pulmonary Department

## 2019-07-23 NOTE — Progress Notes (Signed)
[facility]  ROUNDING NOTE   Subjective:  Patient seen back in follow-up. Critical illness persist in the patient is intubated, sedated, and on multiple drips. She continues on CRRT. Nursing states that there have been no issues with CRRT.   Objective:  Vital signs in last 24 hours:  Temp:  [97.5 F (36.4 C)-98.3 F (36.8 C)] 97.7 F (36.5 C) (01/27 0800) Pulse Rate:  [93-100] 97 (01/27 1100) Resp:  [9-26] 10 (01/27 1100) BP: (106-122)/(45-53) 116/53 (01/27 1100) SpO2:  [89 %-100 %] 90 % (01/27 1135) FiO2 (%):  [40 %-55 %] 40 % (01/27 1135) Weight:  [73 kg] 73 kg (01/27 0432)  Weight change: 1.2 kg Filed Weights   06/28/2019 2134 07/22/19 0454 07/23/19 0432  Weight: 63.5 kg 71.8 kg 73 kg    Intake/Output: I/O last 3 completed shifts: In: 3942 [I.V.:2453.6; Blood:234.5; NG/GT:844.2; IV Piggyback:409.7] Out: 3496 [Urine:120; Emesis/NG output:75; Other:3301]   Intake/Output this shift:  Total I/O In: 598.9 [I.V.:259.1; NG/GT:300; IV Piggyback:39.8] Out: 688.7 [Urine:15; Emesis/NG output:14; Other:659.7]  Physical Exam: General: Critically ill-appearing  Head: Normocephalic, atraumatic.  Endotracheal tube in place.  Eyes: Anicteric  Neck: Supple, trachea midline  Lungs:  Scattered rhonchi, vent assisted  Heart: S1S2 no rubs  Abdomen:  Soft, nontender, bowel sounds present  Extremities: trace peripheral edema.  Neurologic: Intubated, sedated  Skin: No lesions  Access: Temporary right IJ dialysis catheter    Basic Metabolic Panel: Recent Labs  Lab 07/20/19 0500 07/20/19 0500 07/21/19 0446 07/21/19 0446 07/21/19 0930 07/22/19 0408 07/22/19 1600 07/23/19 0512  NA 134*  --  139  --   --  136 136 136  K 4.1   < > 6.0*  --  4.6 4.4 4.8 4.8  CL 100  --  105  --   --  102 102 101  CO2 21*  --  24  --   --  _0 GLUCOSE 185*  --  164*  --   --  209* 204* 323*  BUN 55*  --  63*  --   --  49* 27* 26*  CREATININE 2.00*  --  2.19*  --   --  1.60*  1.02* 0.99  CALCIUM 8.4*   < > 8.9   < >  --  8.2* 8.0* 8.0*  MG  --   --  1.7  --   --  2.3  --  2.5*  PHOS  --   --  4.9*  --   --  3.3 2.7 2.3*   < > = values in this interval not displayed.    Liver Function Tests: Recent Labs  Lab 07/13/2019 2139 07/17/2019 2139 07/19/19 0305 07/19/19 0305 07/20/19 0500 07/21/19 0446 07/22/19 0408 07/22/19 1600 07/23/19 0512  AST 34  --  127*  --  107* 63*  --   --   --   ALT 25  --  82*  --  116* 94*  --   --   --   ALKPHOS 93  --  122  --  99 91  --   --   --   BILITOT 0.7  --  1.1  --  0.7 0.6  --   --   --   PROT 6.8  --  6.2*  --  5.9* 5.7*  --   --   --   ALBUMIN 2.3*   < > 1.9*   < > 2.0* 2.3* 2.4* 2.5* 2.5*   < > =  values in this interval not displayed.   No results for input(s): LIPASE, AMYLASE in the last 168 hours. No results for input(s): AMMONIA in the last 168 hours.  CBC: Recent Labs  Lab 07/06/2019 2139 07/26/2019 2139 07/20/19 0500 07/20/19 0500 07/21/19 0446 07/21/19 0930 07/22/19 0408 07/22/19 1942 07/23/19 0512  WBC 13.7*   < > 10.8*  --  11.8*  --  19.2* 19.7* 17.8*  NEUTROABS 8.2*  --   --   --  10.0*  --   --   --   --   HGB 8.4*   < > 7.3*   < > 6.9* 7.3* 7.0* 8.9* 8.9*  HCT 27.7*   < > 23.5*   < > 22.9* 23.7* 23.0* 28.4* 29.4*  MCV 92.0   < > 89.0  --  90.5  --  90.9 91.0 93.9  PLT 447*   < > 384  --  349  --  565* 566* 460*   < > = values in this interval not displayed.    Cardiac Enzymes: No results for input(s): CKTOTAL, CKMB, CKMBINDEX, TROPONINI in the last 168 hours.  BNP: Invalid input(s): POCBNP  CBG: Recent Labs  Lab 07/22/19 1725 07/22/19 2012 07/23/19 0035 07/23/19 0334 07/23/19 0723  GLUCAP 183* 198* 298* 299* 306*    Microbiology: Results for orders placed or performed during the hospital encounter of 07/08/2019  Culture, blood (Routine x 2)     Status: None   Collection Time: 07/07/2019  9:40 PM   Specimen: BLOOD  Result Value Ref Range Status   Specimen Description BLOOD RIGHT  ANTECUBITAL  Final   Special Requests   Final    BOTTLES DRAWN AEROBIC AND ANAEROBIC Blood Culture adequate volume   Culture   Final    NO GROWTH 5 DAYS Performed at [facility], [address] [place], [place] [address]    Report Status 07/23/2019 FINAL  Final  Culture, blood (Routine x 2)     Status: None   Collection Time: 07/03/2019  9:40 PM   Specimen: BLOOD  Result Value Ref Range Status   Specimen Description BLOOD LEFT ANTECUBITAL  Final   Special Requests   Final    BOTTLES DRAWN AEROBIC AND ANAEROBIC Blood Culture adequate volume   Culture   Final    NO GROWTH 5 DAYS Performed at [facility], [address] [place], [place] [address]    Report Status 07/23/2019 FINAL  Final  MRSA PCR Screening     Status: None   Collection Time: 07/19/19  6:45 AM   Specimen: Nasal Mucosa; Nasopharyngeal  Result Value Ref Range Status   MRSA by PCR NEGATIVE NEGATIVE Final    Comment:        The GeneXpert MRSA Assay (FDA approved for NASAL specimens only), is one component of a comprehensive MRSA colonization surveillance program. It is not intended to diagnose MRSA infection nor to guide or monitor treatment for MRSA infections. Performed at [facility], [address] [place], [place] [address]   Respiratory Panel by PCR     Status: None   Collection Time: 07/19/19 10:03 AM   Specimen: Nasopharyngeal Swab; Respiratory  Result Value Ref Range Status   Adenovirus NOT DETECTED NOT DETECTED Final   Coronavirus 229E NOT DETECTED NOT DETECTED Final    Comment: (NOTE) The Coronavirus on the Respiratory Panel, DOES NOT test for the novel  Coronavirus (2019 nCoV)    Coronavirus HKU1 NOT DETECTED NOT DETECTED  Final   Coronavirus NL63 NOT DETECTED NOT DETECTED Final   Coronavirus OC43 NOT DETECTED NOT DETECTED Final   Metapneumovirus NOT DETECTED NOT DETECTED Final   Rhinovirus / Enterovirus NOT DETECTED NOT DETECTED Final   Influenza A NOT DETECTED  NOT DETECTED Final   Influenza B NOT DETECTED NOT DETECTED Final   Parainfluenza Virus 1 NOT DETECTED NOT DETECTED Final   Parainfluenza Virus 2 NOT DETECTED NOT DETECTED Final   Parainfluenza Virus 3 NOT DETECTED NOT DETECTED Final   Parainfluenza Virus 4 NOT DETECTED NOT DETECTED Final   Respiratory Syncytial Virus NOT DETECTED NOT DETECTED Final   Bordetella pertussis NOT DETECTED NOT DETECTED Final   Chlamydophila pneumoniae NOT DETECTED NOT DETECTED Final   Mycoplasma pneumoniae NOT DETECTED NOT DETECTED Final    Comment: Performed at [facility], [address], [place], [place] [address]    Coagulation Studies: Recent Labs    07/21/19 1246  LABPROT 30.3*  INR 2.9*    Urinalysis: No results for input(s): COLORURINE, LABSPEC, PHURINE, GLUCOSEU, HGBUR, BILIRUBINUR, KETONESUR, PROTEINUR, UROBILINOGEN, NITRITE, LEUKOCYTESUR in the last 72 hours.  Invalid input(s): APPERANCEUR    Imaging: DG Abd 1 View  Result Date: 07/22/2019 CLINICAL DATA:  Encounter for orogastric tube placement EXAM: ABDOMEN - 1 VIEW COMPARISON:  07/19/2019 FINDINGS: Normal bowel gas pattern were covered. Enteric tube with tip at the mid stomach. There is pulmonary opacity and pleural effusions. Artifact from EKG leads. Partially covered central line at the upper cavoatrial junction. IMPRESSION: Orogastric tube in good position. Electronically Signed   By: Monte Fantasia M.D.   On: 07/22/2019 04:54   DG Chest Port 1 View  Result Date: 07/22/2019 CLINICAL DATA:  Acute respiratory failure EXAM: PORTABLE CHEST 1 VIEW COMPARISON:  Radiograph 07/21/2019 FINDINGS: *Endotracheal tube terminates low in the trachea, less than 1 cm from the carina and should be retracted. *Transesophageal tube side port terminates near the GE junction and should be advanced at least 3-5 cm to fully position within the gastric lumen. *A right IJ catheter sheath terminates at the level of the right atrium. *Surgical clips rejected  over the right axilla and breast. *Vascular stent seen superior to the aortic arch in similar position to priors. Mixed bilateral interstitial and airspace opacities are present in both lungs with similar to slightly increased size of the bilateral pleural effusions. No pneumothorax. Cardiomediastinal contours are similar to prior. Postsurgical changes related to prior CABG including intact and aligned sternotomy wires and multiple surgical clips projecting over the mediastinum. Coronary stents are noted overlying the mediastinal silhouette as well. No acute osseous or soft tissue abnormality. Degenerative changes are present in the imaged spine and shoulders. IMPRESSION: 1. Endotracheal tube low in position, less than 1 cm from the carina and should be retracted. 2. Transesophageal tube side port terminates near the GE junction and should be advanced at least 3-5 cm to fully position within the gastric lumen. 3. Persistent bilateral interstitial and airspace opacities compatible with pneumonia and/or edema. These results will be called to the ordering clinician or representative by the Radiologist Assistant, and communication documented in the PACS or zVision Dashboard. Electronically Signed   By: Lovena Le M.D.   On: 07/22/2019 03:57   DG Chest Port 1 View  Result Date: 07/21/2019 CLINICAL DATA:  Intubation, OG tube placement, and central line placement. EXAM: PORTABLE CHEST 1 VIEW COMPARISON:  07/19/2019 FINDINGS: An endotracheal tube terminates less than 1 cm above the carina, deeper than on the  prior study and directed towards the right mainstem bronchus. An enteric tube terminates over the stomach. A right jugular catheter terminates over the high right atrium and is new from the prior study. Sequelae of CABG are again identified. The cardiomediastinal silhouette is unchanged. There are persistent interstitial densities throughout both lungs. Patchy airspace opacities persist in the mid lungs and have  increased in the lung bases with enlarging small bilateral pleural effusions. No pneumothorax is identified. IMPRESSION: 1. Endotracheal tube now terminates less than 1 cm above the carina near the right mainstem bronchus orifice. Recommend retracting 2-3 cm. 2. Bilateral interstitial and airspace opacities with worsening aeration of the lung bases which may reflect edema or pneumonia along with enlarging pleural effusions. Electronically Signed   By: Logan Bores M.D.   On: 07/21/2019 17:38     Medications:   .  prismasol BGK 4/2.5 750 mL/hr at 07/23/19 0830  .  prismasol BGK 4/2.5 200 mL/hr at 07/22/19 2322  . sodium chloride 10 mL/hr at 07/20/19 2045  . amiodarone 30 mg/hr (07/23/19 1100)  . dexmedetomidine (PRECEDEX) IV infusion 0.8 mcg/kg/hr (07/23/19 1100)  . dextrose 5 % and 0.45% NaCl    . feeding supplement (VITAL HIGH PROTEIN) 1,000 mL (07/23/19 1129)  . fentaNYL infusion INTRAVENOUS 100 mcg/hr (07/23/19 1100)  . heparin 500 Units/hr (07/23/19 1100)  . insulin 15 Units/hr (07/23/19 1106)  . norepinephrine (LEVOPHED) Adult infusion 16 mcg/min (07/23/19 1100)  . piperacillin-tazobactam (ZOSYN)  IV Stopped (07/23/19 0701)  . prismasol BGK 4/2.5 1,500 mL/hr at 07/23/19 0845  . sodium phosphate  Dextrose 5% IVPB 43 mL/hr at 07/23/19 1100   . atorvastatin  40 mg Per Tube q1800  . B-complex with vitamin C  1 tablet Per Tube Daily  . chlorhexidine gluconate (MEDLINE KIT)  15 mL Mouth Rinse BID  . Chlorhexidine Gluconate Cloth  6 each Topical Daily  . famotidine  20 mg Per Tube QHS  . hydrocortisone sod succinate (SOLU-CORTEF) inj  50 mg Intravenous Q6H  . ipratropium-albuterol  3 mL Nebulization Q6H  . mouth rinse  15 mL Mouth Rinse 10 times per day  . midodrine  10 mg Oral TID WC  . multivitamin  15 mL Per Tube Daily  . polyethylene glycol  17 g Per Tube Daily  . senna-docusate  2 tablet Per Tube BID   albuterol, dextrose, fentaNYL, heparin, midazolam, midazolam, morphine  injection, phenol  Assessment/ Plan:  74 y.o. female with a PMHx of hypertension, myocardial infarction, CABG and MRA of 2020, diabetes mellitus type 2, COPD on chronic home oxygen, chronic diastolic heart failure, chronic kidney disease stage III baseline creatinine 1.6-1.9, COVID-19 infection 05/15/2019, who was admitted to [facility] on 07/07/2019 for evaluation of increasing shortness of breath, cough, fatigue.   1.  Acute kidney injury likely secondary to acute tubular necrosis/chronic kidney disease stage IIIb baseline creatinine 1.6-1.9.  Urine output remains quite low and was only 40 cc over the preceding 24 hours.  Serum electrolytes have improved significantly and in fact phosphorus a bit low.  We will administer sodium phosphorus intravenous today..  2.  Acute respiratory failure.  Patient remains on ventilatory support.  She will likely be difficult to wean from the ventilator.  3.  Anemia unspecified.  Hemoglobin up to 8.9 post blood transfusion.  Continue to monitor CBC.  Plan:   LOS: 5 Surina Storts 1/27/202112:00 PM

## 2019-07-23 NOTE — Consult Note (Signed)
[place] for Heparin Indication: atrial fibrillation  Allergies  Allergen Reactions  . Metformin And Related Diarrhea  . Sulfur Hives    Patient Measurements: Height: 5\' 2"  (157.5 cm) Weight: 160 lb 15 oz (73 kg) IBW/kg (Calculated) : 50.1 Heparin Dosing Weight:  71.8 kg (actual body weight)  Vital Signs: Temp: 97.6 F (36.4 C) (01/27 0400) Temp Source: Axillary (01/27 0400) BP: 121/52 (01/27 0600) Pulse Rate: 96 (01/27 0600)  Labs: Recent Labs    07/20/19 1052 07/20/19 1314 07/21/19 0446 07/21/19 1246 07/22/19 0132 07/22/19 0408 07/22/19 0408 07/22/19 0955 07/22/19 1600 07/22/19 1942 07/23/19 0512  HGB  --   --    < >  --   --  7.0*   < >  --   --  8.9* 8.9*  HCT  --   --    < >  --   --  23.0*  --   --   --  28.4* 29.4*  PLT  --   --    < >  --   --  565*  --   --   --  566* 460*  APTT  --   --   --   --    < >  --   --  157*  --  106* 103*  LABPROT  --   --   --  30.3*  --   --   --   --   --   --   --   INR  --   --   --  2.9*  --   --   --   --   --   --   --   CREATININE  --   --    < >  --   --  1.60*  --   --  1.02*  --  0.99  TROPONINIHS 1,404* 1,502*  --   --   --   --   --   --   --   --   --    < > = values in this interval not displayed.    Estimated Creatinine Clearance: 48.1 mL/min (by C-G formula based on SCr of 0.99 mg/dL).   Medical History: Past Medical History:  Diagnosis Date  . Arrhythmia    atrial fibrillation  . CHF (congestive heart failure) ([place])   . COPD (chronic obstructive pulmonary disease) ([place])   . Diabetes mellitus without complication ([place])   . Heart attack ([place])   . Hypertension   . Renal disorder     Assessment: 74 year-old female admitted with COVID 19 pneumonitis, severe COPD, and multi-organ failure with NSTEMI and atrial fibrillation rate-controlled with amiodarone.   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  She is currently on  apixaban at home for chronic atrial fibrillation.  Last inpatient dose of apixaban 1/24 @ 2200.  Will continue to monitor hemoglobin, which is low in the setting of CKD.  On 1/25, patient was intubated and received central line.  She is receiving CRRT currently. No baseline heparin level was drawn.   1/26 0132 aPTT > 160 held heparin until 0415 and will restart at 750 units/hr  1/26 0955 aPTT 157 held heparin for 1 hour, and resume at 600 units/hr. 1/26 1942 aPTT 106 decrease the heparin infusion to 550 units/hr  Goal of Therapy:  Heparin level 0.3-0.7 units/ml Goal aPTT level is 66-102 seconds.  Monitor  platelets by anticoagulation protocol: Yes   Plan:  01/27 @ 0500 aPTT 103 seconds slightly supratherapeutic. Will decrease rate to 500 units/hr and will recheck aPTT at 1200, CBC low but stable will continue to monitor.  Tobie Lords, PharmD, BCPS 07/23/2019 6:07 AM

## 2019-07-23 NOTE — Progress Notes (Signed)
   07/23/19 1900  Clinical Encounter Type  Visited With Patient and family together  Visit Type Follow-up  Referral From Chaplain  Consult/Referral To Chaplain  Chaplain rounding the ICU, visited with patient and said a silent prayer.

## 2019-07-23 NOTE — Plan of Care (Signed)
Pt's daughter Lattie Haw at bedside, TF running, UOP <20 cc, CRRT running w/o issue, pt turned Q2H. Lattie Haw updated by writing RN.   Pt noted to have very large solid mobile lump near her right axilla, per other family member contacted by Lattie Haw this is a lipoma.  Patient was already aware and a physician has examined it in the past

## 2019-07-24 ENCOUNTER — Inpatient Hospital Stay: Payer: Medicare Other

## 2019-07-24 ENCOUNTER — Inpatient Hospital Stay: Payer: Self-pay

## 2019-07-24 DIAGNOSIS — I5021 Acute systolic (congestive) heart failure: Secondary | ICD-10-CM

## 2019-07-24 LAB — RENAL FUNCTION PANEL
Albumin: 2.4 g/dL — ABNORMAL LOW (ref 3.5–5.0)
Albumin: 2.4 g/dL — ABNORMAL LOW (ref 3.5–5.0)
Anion gap: 11 (ref 5–15)
Anion gap: 8 (ref 5–15)
BUN: 23 mg/dL (ref 8–23)
BUN: 28 mg/dL — ABNORMAL HIGH (ref 8–23)
CO2: 27 mmol/L (ref 22–32)
CO2: 27 mmol/L (ref 22–32)
Calcium: 7.9 mg/dL — ABNORMAL LOW (ref 8.9–10.3)
Calcium: 7.8 mg/dL — ABNORMAL LOW (ref 8.9–10.3)
Chloride: 98 mmol/L (ref 98–111)
Chloride: 101 mmol/L (ref 98–111)
Creatinine, Ser: 0.8 mg/dL (ref 0.44–1.00)
Creatinine, Ser: 0.97 mg/dL (ref 0.44–1.00)
GFR calc Af Amer: >60 mL/min (ref >60)
GFR calc Af Amer: >60 mL/min (ref >60)
GFR calc non Af Amer: >60 mL/min (ref >60)
GFR calc non Af Amer: 58 mL/min — ABNORMAL LOW (ref >60)
Glucose, Bld: 237 mg/dL — ABNORMAL HIGH (ref 70–99)
Glucose, Bld: 222 mg/dL — ABNORMAL HIGH (ref 70–99)
Phosphorus: 2.2 mg/dL — ABNORMAL LOW (ref 2.5–4.6)
Phosphorus: 4.3 mg/dL (ref 2.5–4.6)
Potassium: 4.7 mmol/L (ref 3.5–5.1)
Potassium: 4.8 mmol/L (ref 3.5–5.1)
Sodium: 136 mmol/L (ref 135–145)
Sodium: 136 mmol/L (ref 135–145)

## 2019-07-24 LAB — MAGNESIUM: Magnesium: 2.6 mg/dL — ABNORMAL HIGH (ref 1.7–2.4)

## 2019-07-24 LAB — APTT
aPTT: 59 seconds — ABNORMAL HIGH (ref 24–36)
aPTT: 90 seconds — ABNORMAL HIGH (ref 24–36)

## 2019-07-24 LAB — CBC
HCT: 26.1 % — ABNORMAL LOW (ref 36.0–46.0)
Hemoglobin: 8.1 g/dL — ABNORMAL LOW (ref 12.0–15.0)
MCH: 28.7 pg (ref 26.0–34.0)
MCHC: 31 g/dL (ref 30.0–36.0)
MCV: 92.6 fL (ref 80.0–100.0)
Platelets: 454 10*3/uL — ABNORMAL HIGH (ref 150–400)
RBC: 2.82 MIL/uL — ABNORMAL LOW (ref 3.87–5.11)
RDW: 17.6 % — ABNORMAL HIGH (ref 11.5–15.5)
WBC: 24.6 10*3/uL — ABNORMAL HIGH (ref 4.0–10.5)
nRBC: 8.5 % — ABNORMAL HIGH (ref 0.0–0.2)

## 2019-07-24 LAB — GLUCOSE, CAPILLARY
Glucose-Capillary: 213 mg/dL — ABNORMAL HIGH (ref 70–99)
Glucose-Capillary: 200 mg/dL — ABNORMAL HIGH (ref 70–99)
Glucose-Capillary: 154 mg/dL — ABNORMAL HIGH (ref 70–99)
Glucose-Capillary: 204 mg/dL — ABNORMAL HIGH (ref 70–99)
Glucose-Capillary: 175 mg/dL — ABNORMAL HIGH (ref 70–99)
Glucose-Capillary: 100 mg/dL — ABNORMAL HIGH (ref 70–99)

## 2019-07-24 LAB — PATHOLOGIST SMEAR REVIEW

## 2019-07-24 MED ORDER — VASOPRESSIN 20 UNIT/ML IV SOLN
0.0300 [IU]/min | INTRAVENOUS | Status: DC
  Administered 2019-07-24 – 2019-08-01 (×8): 0.03 [IU]/min via INTRAVENOUS
  Filled 2019-07-24 (×11): qty 2

## 2019-07-24 MED ORDER — INSULIN DETEMIR 100 UNIT/ML ~~LOC~~ SOLN
8.0000 [IU] | Freq: Two times a day (BID) | SUBCUTANEOUS | Status: DC
  Administered 2019-07-24 – 2019-07-30 (×12): 8 [IU] via SUBCUTANEOUS
  Filled 2019-07-24 (×15): qty 0.08

## 2019-07-24 MED ORDER — SODIUM PHOSPHATES 45 MMOLE/15ML IV SOLN
20.0000 mmol | Freq: Once | INTRAVENOUS | Status: AC
  Administered 2019-07-24: 20 mmol via INTRAVENOUS
  Filled 2019-07-24: qty 6.67

## 2019-07-24 MED ORDER — SODIUM CHLORIDE 0.9% FLUSH: 10.0000 mL | INTRAVENOUS | Status: DC | PRN

## 2019-07-24 MED ORDER — SODIUM CHLORIDE 0.9 % IV SOLN
250.0000 mg | Freq: Three times a day (TID) | INTRAVENOUS | Status: DC
  Administered 2019-07-24 – 2019-07-25 (×3): 250 mg via INTRAVENOUS
  Filled 2019-07-24 (×4): qty 5

## 2019-07-24 MED ORDER — IPRATROPIUM-ALBUTEROL 0.5-2.5 (3) MG/3ML IN SOLN
3.0000 mL | Freq: Two times a day (BID) | RESPIRATORY_TRACT | Status: DC
  Administered 2019-07-24 – 2019-08-01 (×16): 3 mL via RESPIRATORY_TRACT
  Filled 2019-07-24 (×16): qty 3

## 2019-07-24 MED ORDER — INSULIN ASPART 100 UNIT/ML ~~LOC~~ SOLN
2.0000 [IU] | SUBCUTANEOUS | Status: DC
  Administered 2019-07-24 – 2019-07-25 (×3): 2 [IU] via SUBCUTANEOUS
  Filled 2019-07-24 (×3): qty 1

## 2019-07-24 MED ORDER — PIPERACILLIN-TAZOBACTAM 3.375 G IVPB
3.3750 g | Freq: Three times a day (TID) | INTRAVENOUS | Status: DC
  Administered 2019-07-24 – 2019-07-25 (×2): 3.375 g via INTRAVENOUS
  Filled 2019-07-24 (×2): qty 50

## 2019-07-24 MED ORDER — INSULIN ASPART 100 UNIT/ML ~~LOC~~ SOLN
3.0000 [IU] | SUBCUTANEOUS | Status: DC
  Administered 2019-07-24: 6 [IU] via SUBCUTANEOUS
  Administered 2019-07-24: 16:00:00 9 [IU] via SUBCUTANEOUS
  Administered 2019-07-25: 3 [IU] via SUBCUTANEOUS
  Administered 2019-07-25 – 2019-07-26 (×3): 9 [IU] via SUBCUTANEOUS
  Administered 2019-07-26 (×3): 6 [IU] via SUBCUTANEOUS
  Administered 2019-07-27 (×2): 3 [IU] via SUBCUTANEOUS
  Administered 2019-07-27 (×3): 6 [IU] via SUBCUTANEOUS
  Administered 2019-07-28: 09:00:00 9 [IU] via SUBCUTANEOUS
  Administered 2019-07-28: 6 [IU] via SUBCUTANEOUS
  Administered 2019-07-28: 12:00:00 9 [IU] via SUBCUTANEOUS
  Filled 2019-07-24 (×17): qty 1

## 2019-07-24 NOTE — Consult Note (Signed)
Pharmacy has been consulted to monitor medication adjustments for CRRT.  No adjustments needed at this time.   Gerald Dexter, PharmD 07/24/2019 8:20 AM

## 2019-07-24 NOTE — Progress Notes (Signed)
FAMILY REQUESTS TRANSFER TO [facility].  [facility] has denied transfer after review of chart and discussions, They would NOT do anything different than what we are doing.  I am waiting to hear back from [facility] for answer.   Corrin Parker, M.D.  [facility]  Medical Director [place] Director [facility] Cardio-Pulmonary Department

## 2019-07-24 NOTE — Plan of Care (Signed)
Pt turned Q2H this shift d/t blanchable redness over bony sacral area, denies pain/discomfort by shaking head, responsive but seems too weak to follow motor commands, unable to tolerate FIO2 reduced to 40% so FIO2 is at 50%, heparin stopped per Dr Mortimer Fries d/t new appearance of blood in ETT suctioning, daughter at bedside, midline placed by IV team.  Vaso added but so far MAP not adequate to titrate levo down; it remains at 16 mcg/min. Pt does not like to close her eyes so saline instilled to keep them moist, oral cavity usually dry so oral care done as often as possible, > than Q2H for the most part.

## 2019-07-24 NOTE — Progress Notes (Addendum)
Palliative:  HPI:74 y.o.femalewith past medical history of COPD, CHF, atrial fibrillation, CAD s/p CABG 2020, h/o MI, h/o PE, hypertension, CKD stage 3, diabetes, breast cancer in remissionadmitted on 1/22/2021with shortness of breath.Hospitalization 3 times over Nov-Dec 2020 with COVID pneumonia. Reportedly had steady improvement until ~3 days prior to this admission when she became more short of breath and with dry cough and diagnosed with multifocal pneumonia. Acute decompensation and required intubation 1/23 and was extubated 1/24 but now with increased work of breathing and requiring BiPAP. Continues to be high risk for re-intubation with COVID pneumonitis and severe COPD exacerbation.07/21/19 Re-intubated after my discussion and visit on 07/22/19 on CRRT and vent support as well as requiring vasopressor support and amiodarone infusion.     I met today at Ms. Houlton's bedside with daughter, Lattie Haw. Ms. Averhart appears more sedated but does move her lips at times and appears to be trying to communicate. She becomes very agitated with oral care and any movement. She has new bloody secretions from deep suction. Her breathing also appears more overall labored than yesterday although she does not appear overtly uncomfortable. Discussed with bedside RN Malachy Mood as well as Dr. Mortimer Fries.   I called and spoke with daughter, [name]. I updated her on above changes. Oxygen requirements have also increased today. [name] is also concerned and was asking if there is a role for continued antibiotic therapy - I passed this inquiry to Dr. Mortimer Fries for consideration. I am becoming increasingly concerned that she is unlikely to be able to extubate successfully. I wanted family to be prepared that there is a high likelihood for one way extubation into the future. [name] is appropriately saddened and still trying to be hopeful but also realistic. She plans to speak further with family this evening. Continue with current support and palliative  to continue to support family.   I also let [name] know that I will not return until Tuesday but my colleague, Darol Destine, will follow up with her tomorrow. All questions/concerns addressed. Emotional support provided.   Exam: More sedated/less responsive today. Vent 50% Fi02 (increase to 55% this afternoon). Breathing above vent but appears more labored. Abd soft, flat. Extremities warm to touch.   Plan: - Family to continue discussions.  - Continue palliative support.  - Overall prognosis is poor.   [place], NP Palliative Medicine Team Pager [phone] (Please see [email or web address] for schedule) Team Phone [phone]    Greater than 50%  of this time was spent counseling and coordinating care related to the above assessment and plan

## 2019-07-24 NOTE — Progress Notes (Signed)
Patient's daughter [name] called to request transfer to [facility] or [facility], Dr Mortimer Fries notified, he will inquire, although acceptance is unlikely.  Pt with crackles, CRRT UF increased for the time being since it is unlikely she will respond to a diuretic. Daughter Lattie Haw remains at bedside. Pt seems to be declining, [name] informed.  FIO2 requirements increasing and vaso added this shift.  PT less responsive and appears to struggle with the vent, stacks breaths, sedation titrated conservatively.

## 2019-07-24 NOTE — Progress Notes (Signed)
[facility]  ROUNDING NOTE   Subjective:  Patient remains on CRRT. We have reduced prefilter replacement fluid as well as dialysate rate. Urine output remains quite low at 22 cc over the preceding 24 hours. However metabolic parameters significantly improved.   Objective:  Vital signs in last 24 hours:  Temp:  [98.4 F (36.9 C)-98.6 F (37 C)] 98.4 F (36.9 C) (01/28 0900) Pulse Rate:  [91-120] 98 (01/28 0700) Resp:  [8-24] 11 (01/28 1100) BP: (97-129)/(46-93) 120/88 (01/28 1100) SpO2:  [85 %-97 %] 91 % (01/28 0819) FiO2 (%):  [40 %-50 %] 50 % (01/28 0900) Weight:  [76.3 kg] 76.3 kg (01/28 0435)  Weight change: 3.3 kg Filed Weights   07/22/19 0454 07/23/19 0432 07/24/19 0435  Weight: 71.8 kg 73 kg 76.3 kg    Intake/Output: I/O last 3 completed shifts: In: 4639.1 [I.V.:2094.8; NG/GT:2050; IV Piggyback:494.3] Out: 4732.7 [Urine:37; Emesis/NG output:14; Other:4681.7]   Intake/Output this shift:  Total I/O In: 863.5 [I.V.:318.7; NG/GT:410; IV Piggyback:134.8] Out: 840.5 [Urine:10; Other:830.5]  Physical Exam: General: Critically ill-appearing  Head: Normocephalic, atraumatic.  Endotracheal tube in place.  Eyes: Anicteric  Neck: Supple, trachea midline  Lungs:  Scattered rhonchi, vent assisted  Heart: S1S2 no rubs  Abdomen:  Soft, nontender, bowel sounds present  Extremities: trace peripheral edema.  Neurologic: Intubated, sedated  Skin: No lesions  Access: Temporary right IJ dialysis catheter    Basic Metabolic Panel: Recent Labs  Lab 07/21/19 0446 07/21/19 0930 07/22/19 0408 07/22/19 0408 07/22/19 1600 07/22/19 1600 07/23/19 0512 07/23/19 1737 07/24/19 0229 07/24/19 0423  NA 139   < > 136  --  136  --  136 136  --  136  K 6.0*   < > 4.4  --  4.8  --  4.8 4.3  --  4.7  CL 105   < > 102  --  102  --  101 101  --  98  CO2 24   < > 22  --  25  --  24 27  --  27  GLUCOSE 164*   < > 209*  --  204*  --  323* 156*  --  237*  BUN 63*   < > 49*   --  27*  --  26* 20  --  23  CREATININE 2.19*   < > 1.60*  --  1.02*  --  0.99 0.81  --  0.80  CALCIUM 8.9   < > 8.2*   < > 8.0*   < > 8.0* 8.0*  --  7.9*  MG 1.7  --  2.3  --   --   --  2.5*  --  2.6*  --   PHOS 4.9*   < > 3.3  --  2.7  --  2.3* 3.2  --  2.2*   < > = values in this interval not displayed.    Liver Function Tests: Recent Labs  Lab 07/14/2019 2139 07/08/2019 2139 07/19/19 0305 07/19/19 0305 07/20/19 0500 07/20/19 0500 07/21/19 0446 07/21/19 0446 07/22/19 0408 07/22/19 1600 07/23/19 0512 07/23/19 1737 07/24/19 0423  AST 34  --  127*  --  107*  --  63*  --   --   --   --   --   --   ALT 25  --  82*  --  116*  --  94*  --   --   --   --   --   --   Arabella Merles  93  --  122  --  99  --  91  --   --   --   --   --   --   BILITOT 0.7  --  1.1  --  0.7  --  0.6  --   --   --   --   --   --   PROT 6.8  --  6.2*  --  5.9*  --  5.7*  --   --   --   --   --   --   ALBUMIN 2.3*   < > 1.9*   < > 2.0*   < > 2.3*   < > 2.4* 2.5* 2.5* 2.4* 2.4*   < > = values in this interval not displayed.   No results for input(s): LIPASE, AMYLASE in the last 168 hours. No results for input(s): AMMONIA in the last 168 hours.  CBC: Recent Labs  Lab 07/20/2019 2139 07/20/19 0500 07/21/19 0446 07/21/19 0446 07/21/19 0930 07/22/19 0408 07/22/19 1942 07/23/19 0512 07/24/19 0229  WBC 13.7*   < > 11.8*  --   --  19.2* 19.7* 17.8* 24.6*  NEUTROABS 8.2*  --  10.0*  --   --   --   --   --   --   HGB 8.4*   < > 6.9*   < > 7.3* 7.0* 8.9* 8.9* 8.1*  HCT 27.7*   < > 22.9*   < > 23.7* 23.0* 28.4* 29.4* 26.1*  MCV 92.0   < > 90.5  --   --  90.9 91.0 93.9 92.6  PLT 447*   < > 349  --   --  565* 566* 460* 454*   < > = values in this interval not displayed.    Cardiac Enzymes: No results for input(s): CKTOTAL, CKMB, CKMBINDEX, TROPONINI in the last 168 hours.  BNP: Invalid input(s): POCBNP  CBG: Recent Labs  Lab 07/23/19 2014 07/23/19 2317 07/24/19 0407 07/24/19 0714 07/24/19 1132  GLUCAP  141* 185* 213* 200* 154*    Microbiology: Results for orders placed or performed during the hospital encounter of 07/12/2019  Culture, blood (Routine x 2)     Status: None   Collection Time: 07/27/2019  9:40 PM   Specimen: BLOOD  Result Value Ref Range Status   Specimen Description BLOOD RIGHT ANTECUBITAL  Final   Special Requests   Final    BOTTLES DRAWN AEROBIC AND ANAEROBIC Blood Culture adequate volume   Culture   Final    NO GROWTH 5 DAYS Performed at [facility], [address] [place], [place] [address]    Report Status 07/23/2019 FINAL  Final  Culture, blood (Routine x 2)     Status: None   Collection Time: 07/01/2019  9:40 PM   Specimen: BLOOD  Result Value Ref Range Status   Specimen Description BLOOD LEFT ANTECUBITAL  Final   Special Requests   Final    BOTTLES DRAWN AEROBIC AND ANAEROBIC Blood Culture adequate volume   Culture   Final    NO GROWTH 5 DAYS Performed at [facility], [address], [place], [place] [address]    Report Status 07/23/2019 FINAL  Final  MRSA PCR Screening     Status: None   Collection Time: 07/19/19  6:45 AM   Specimen: Nasal Mucosa; Nasopharyngeal  Result Value Ref Range Status   MRSA by PCR NEGATIVE NEGATIVE Final    Comment:  The GeneXpert MRSA Assay (FDA approved for NASAL specimens only), is one component of a comprehensive MRSA colonization surveillance program. It is not intended to diagnose MRSA infection nor to guide or monitor treatment for MRSA infections. Performed at [facility], [address] [place], [place] [address]   Respiratory Panel by PCR     Status: None   Collection Time: 07/19/19 10:03 AM   Specimen: Nasopharyngeal Swab; Respiratory  Result Value Ref Range Status   Adenovirus NOT DETECTED NOT DETECTED Final   Coronavirus 229E NOT DETECTED NOT DETECTED Final    Comment: (NOTE) The Coronavirus on the Respiratory Panel, DOES NOT test for the novel  Coronavirus (2019  nCoV)    Coronavirus HKU1 NOT DETECTED NOT DETECTED Final   Coronavirus NL63 NOT DETECTED NOT DETECTED Final   Coronavirus OC43 NOT DETECTED NOT DETECTED Final   Metapneumovirus NOT DETECTED NOT DETECTED Final   Rhinovirus / Enterovirus NOT DETECTED NOT DETECTED Final   Influenza A NOT DETECTED NOT DETECTED Final   Influenza B NOT DETECTED NOT DETECTED Final   Parainfluenza Virus 1 NOT DETECTED NOT DETECTED Final   Parainfluenza Virus 2 NOT DETECTED NOT DETECTED Final   Parainfluenza Virus 3 NOT DETECTED NOT DETECTED Final   Parainfluenza Virus 4 NOT DETECTED NOT DETECTED Final   Respiratory Syncytial Virus NOT DETECTED NOT DETECTED Final   Bordetella pertussis NOT DETECTED NOT DETECTED Final   Chlamydophila pneumoniae NOT DETECTED NOT DETECTED Final   Mycoplasma pneumoniae NOT DETECTED NOT DETECTED Final    Comment: Performed at [facility], [address], [place], [place] [address]    Coagulation Studies: Recent Labs    07/21/19 1246  LABPROT 30.3*  INR 2.9*    Urinalysis: No results for input(s): COLORURINE, LABSPEC, PHURINE, GLUCOSEU, HGBUR, BILIRUBINUR, KETONESUR, PROTEINUR, UROBILINOGEN, NITRITE, LEUKOCYTESUR in the last 72 hours.  Invalid input(s): APPERANCEUR    Imaging: DG Chest Port 1 View  Result Date: 07/24/2019 CLINICAL DATA:  Check endotracheal tube placement EXAM: PORTABLE CHEST 1 VIEW COMPARISON:  07/22/2019 FINDINGS: Endotracheal tube, gastric catheter and right jugular central line are again seen in satisfactory position. The endotracheal tube is now 3.5 cm above the carina. Patchy opacities are again seen in both lungs slightly increased from the prior exam. Small effusions are noted bilaterally. Postsurgical changes are again seen. Left subclavian arterial stent is again noted. IMPRESSION: Slight increase in the degree of airspace opacities when compare with the prior exam particularly on the right. Tubes and lines as described above. Electronically  Signed   By: Inez Catalina M.D.   On: 07/24/2019 11:24   [place] EKG SITE RITE  Result Date: 07/24/2019 If Site Rite image not attached, placement could not be confirmed due to current cardiac rhythm.    Medications:   .  prismasol BGK 4/2.5 300 mL/hr at 07/24/19 0824  .  prismasol BGK 4/2.5 200 mL/hr at 07/22/19 2322  . sodium chloride 10 mL/hr at 07/20/19 2045  . amiodarone 30 mg/hr (07/24/19 1200)  . dexmedetomidine (PRECEDEX) IV infusion 1 mcg/kg/hr (07/24/19 1200)  . dextrose    . erythromycin    . feeding supplement (VITAL HIGH PROTEIN) 1,000 mL (07/24/19 1119)  . fentaNYL infusion INTRAVENOUS 50 mcg/hr (07/24/19 1200)  . heparin Stopped (07/24/19 1200)  . norepinephrine (LEVOPHED) Adult infusion 16 mcg/min (07/24/19 1200)  . prismasol BGK 4/2.5 1,000 mL/hr at 07/24/19 0840  . sodium phosphate  Dextrose 5% IVPB 65 mL/hr at 07/24/19 1200  . vasopressin (PITRESSIN)  infusion - *FOR SHOCK* 0.03 Units/min (07/24/19 1200)   . atorvastatin  40 mg Per Tube q1800  . B-complex with vitamin C  1 tablet Per Tube Daily  . chlorhexidine gluconate (MEDLINE KIT)  15 mL Mouth Rinse BID  . Chlorhexidine Gluconate Cloth  6 each Topical Daily  . famotidine  20 mg Per Tube QHS  . hydrocortisone sod succinate (SOLU-CORTEF) inj  50 mg Intravenous Q6H  . insulin aspart  2 Units Subcutaneous Q4H  . insulin aspart  3-9 Units Subcutaneous Q4H  . insulin detemir  8 Units Subcutaneous Q12H  . ipratropium-albuterol  3 mL Nebulization BID  . mouth rinse  15 mL Mouth Rinse 10 times per day  . midodrine  10 mg Oral TID WC  . multivitamin  15 mL Per Tube Daily  . polyethylene glycol  17 g Per Tube Daily  . senna-docusate  2 tablet Per Tube BID   albuterol, dextrose, fentaNYL, heparin, midazolam, midazolam, morphine injection, phenol  Assessment/ Plan:  74 y.o. female with a PMHx of hypertension, myocardial infarction, CABG and MRA of 2020, diabetes mellitus type 2, COPD on chronic home oxygen, chronic  diastolic heart failure, chronic kidney disease stage III baseline creatinine 1.6-1.9, COVID-19 infection 05/15/2019, who was admitted to [facility] on 07/19/2019 for evaluation of increasing shortness of breath, cough, fatigue.   1.  Acute kidney injury likely secondary to acute tubular necrosis/chronic kidney disease stage IIIb baseline creatinine 1.6-1.9.   -Urine output was only 22 cc over the preceding 24 hours.  However metabolic parameters significantly improved with initiation of dialysis.  We will maintain the patient on CRRT at the moment.  We have reduced prefilter replacement fluid rate as well as dialysate rate.  2.  Acute respiratory failure.  Patient maintained on the ventilator.  Weaning as per pulmonary/critical care.  3.  Anemia unspecified.  Hgb down to 8.1.  Continue to monitor, hold off on epogen at this time.   Plan:   LOS: 6 Surah Pelley 1/28/202112:07 PM

## 2019-07-24 NOTE — Progress Notes (Signed)
Pharmacy Antibiotic Note  Andrea [name] is a 74 y.o. female admitted on 07/17/2019 with pneumonia.  Pharmacy has been consulted for Zosyn dosing.  Plan: Zosyn 3.375g IV q8h (4 hour infusion).  Height: 5\' 2"  (157.5 cm) Weight: 168 lb 3.4 oz (76.3 kg) IBW/kg (Calculated) : 50.1  Temp (24hrs), Avg:98.5 F (36.9 C), Min:98.2 F (36.8 C), Max:98.8 F (37.1 C)  Recent Labs  Lab 07/10/2019 2139 07/23/2019 2343 07/19/19 0305 07/21/19 0446 07/21/19 0446 07/22/19 0408 07/22/19 0408 07/22/19 1600 07/22/19 1942 07/23/19 0512 07/23/19 1737 07/24/19 0229 07/24/19 0423 07/24/19 1636  WBC 13.7*  --    < > 11.8*  --  19.2*  --   --  19.7* 17.8*  --  24.6*  --   --   CREATININE 1.78*  --    < > 2.19*   < > 1.60*   < > 1.02*  --  0.99 0.81  --  0.80 0.97  LATICACIDVEN 6.2* 2.8*  --   --   --   --   --   --   --   --   --   --   --   --    < > = values in this interval not displayed.    Estimated Creatinine Clearance: 50.2 mL/min (by C-G formula based on SCr of 0.97 mg/dL).    Allergies  Allergen Reactions  . Metformin And Related Diarrhea  . Sulfur Hives    Antimicrobials this admission:   >>    >>   Dose adjustments this admission:   Microbiology results:  BCx:   UCx:    Sputum:    MRSA PCR:   Thank you for allowing pharmacy to be a part of this patient's care.  Hailley Byers D 07/24/2019 6:55 PM

## 2019-07-24 NOTE — Progress Notes (Signed)
Spoke with Malachy Mood RN re: PICC placement. Patient is renal and followed by Nephrology and on CRRT. Patient is not a candidate for PICC and midline placement. Patient has 3 working PIVs and 1 RIJ [place].Primary RN will notify MD.

## 2019-07-24 NOTE — Progress Notes (Signed)
CRITICAL CARE NOTE  BRIEF PATIENT DESCRIPTION:  74 yo female dx with COVID-19 04/2019 admitted with acute on chronic renal failure with hyperkalemia, elevated troponin, and acute on chronic hypoxic hypercapnic respiratory failure secondary to bacterial pneumonia vs. 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 She presented to [facility] ER via EMS on 01/22 with worsening shortness of breath, cough, and fatigue.  At baseline pt wears chronic home O2 @2L , however due to symptoms she has had to increase her O2 to 4L 24hrs prior to current ER presentation.  En route to the ER EMS placed pt on 6L O2 and administered albuterol.  Upon arrival to the ER pt tachypneic with increased work of breathing requiring iv steroids, duonebs, and HFNC.  Lab results revealed Na+ 133, CO2 21, BUN 38, creatinine 1.78, glucose 300, anion gap 17, troponin 90, lactic acid 6.2, pct 1.11, wbc 13.7, hgb 8.4, and vbg pH 7.38/pCO2 42.  CXR concerning for multifocal pneumonia and small bilateral pleural effusions.  She received solumedrol, vancomycin, and cefepime.  She was subsequently admitted to the stepdown unit per hospitalist team for additional workup and treatment, however remained in the ER pending bed availability.  On 01/23 pt developed worsening acute hypoxic respiratory failure possibly due to flash pulmonary edema after receiving a total of 3L of fluids requiring mechanical intubation.  PCCM contacted to assume care.  Pt does have a hx of COVID-19 diagnosed 05/14/2020, which required hospitalization/treatment at [facility]. Pt discharged home on 05/20/2019.  However, she required hospitalization again at [facility] on  05/31/2019 due to multifocal pneumonia, following treatment she was discharged home on 06/29/2019.       CC  Follow up severe resp failure On vent   HPI +multiorgan failure +vasopressors fio2 increased to 50% Prognosis is poor Very poor chance of meaningful recovery Cardiogenic shock and on CRRT    BP 120/88   Pulse 98   Temp 98.4 F (36.9 C) (Oral)   Resp 11   Ht 5' 2"  (1.575 m)   Wt 76.3 kg   SpO2 91%   BMI 30.77 kg/m    I/O last 3 completed shifts: In: 4639.1 [I.V.:2094.8; NG/GT:2050; IV Piggyback:494.3] Out: 4732.7 [Urine:37; Emesis/NG output:14; Other:4681.7] Total I/O In: 625.6 [I.V.:253.3; NG/GT:300; IV Piggyback:72.3] Out: 619.8 [Urine:10; Other:609.8]  SpO2: 91 % O2 Flow Rate (L/min): 50 L/min FiO2 (%): 50 %   SIGNIFICANT EVENTS/STUDIES:  01/22: Pt admitted to the stepdown unit with acute hypoxic respiratory failure secondary to multifocal pneumonia, however remained in the ER pending bed availability 01/23: Pt developed worsening acute hypoxic respiratory failure requiring emergent mechanical intubation in the ER.  PCCM contacted to assume care 1/24 - parameters for SBT met, patient s/p weaning trial with successful liberation from MV.  Updated daughter [name].   1/25 increased WOB and back on biPAP, re-intubated, started on CRRT, family updated multiple times 1/26 severe multiorgan failure, cardiogenic shock, on CRRT, multiple vasopressors 1/27 via multiorgan failure and cardiogenic shock CRRT multiple pressors 1/28 fio2 increased to 50%, unable to perform SAT/SBT Remains in cardiogenic shock On CRRT and multiple vasopressors   REVIEW OF SYSTEMS  PATIENT IS UNABLE TO PROVIDE COMPLETE REVIEW OF SYSTEM S DUE TO SEVERE CRITICAL  ILLNESS AND ENCEPHALOPATHY  PHYSICAL EXAMINATION:  GENERAL:critically ill appearing, +resp distress HEAD: Normocephalic, atraumatic.  EYES: Pupils equal, round, reactive to light.  No scleral icterus.  MOUTH: Moist mucosal  membrane. NECK: Supple. No thyromegaly. No nodules. No JVD.  PULMONARY: +rhonchi, +wheezing CARDIOVASCULAR: S1 and S2. Regular rate and rhythm. No murmurs, rubs, or gallops.  GASTROINTESTINAL: Soft, nontender, -distended. Positive bowel sounds.  MUSCULOSKELETAL: No swelling, clubbing, or edema.  NEUROLOGIC: obtunded SKIN:intact,warm,dry      CULTURE RESULTS   Recent Results (from the past 240 hour(s))  Culture, blood (Routine x 2)     Status: None   Collection Time: 06/28/2019  9:40 PM   Specimen: BLOOD  Result Value Ref Range Status   Specimen Description BLOOD RIGHT ANTECUBITAL  Final   Special Requests   Final    BOTTLES DRAWN AEROBIC AND ANAEROBIC Blood Culture adequate volume   Culture   Final    NO GROWTH 5 DAYS Performed at [facility], [address] [place], [place] [address]    Report Status 07/23/2019 FINAL  Final  Culture, blood (Routine x 2)     Status: None   Collection Time: 07/09/2019  9:40 PM   Specimen: BLOOD  Result Value Ref Range Status   Specimen Description BLOOD LEFT ANTECUBITAL  Final   Special Requests   Final    BOTTLES DRAWN AEROBIC AND ANAEROBIC Blood Culture adequate volume   Culture   Final    NO GROWTH 5 DAYS Performed at [facility], [address] [place], [place] [address]    Report Status 07/23/2019 FINAL  Final  MRSA PCR Screening     Status: None   Collection Time: 07/19/19  6:45 AM   Specimen: Nasal Mucosa; Nasopharyngeal  Result Value Ref Range Status   MRSA by PCR NEGATIVE NEGATIVE Final    Comment:        The GeneXpert MRSA Assay (FDA approved for NASAL specimens only), is one component of a comprehensive MRSA colonization surveillance program. It is not intended to diagnose MRSA infection nor to guide or monitor treatment for MRSA infections. Performed at [facility], [address] [place], [place] [address]   Respiratory Panel by PCR     Status: None   Collection Time: 07/19/19 10:03  AM   Specimen: Nasopharyngeal Swab; Respiratory  Result Value Ref Range Status   Adenovirus NOT DETECTED NOT DETECTED Final   Coronavirus 229E NOT DETECTED NOT DETECTED Final    Comment: (NOTE) The Coronavirus on the Respiratory Panel, DOES NOT test for the novel  Coronavirus (2019 nCoV)    Coronavirus HKU1 NOT DETECTED NOT DETECTED Final   Coronavirus NL63 NOT DETECTED NOT DETECTED Final   Coronavirus OC43 NOT DETECTED NOT DETECTED Final   Metapneumovirus NOT DETECTED NOT DETECTED Final   Rhinovirus / Enterovirus NOT DETECTED NOT DETECTED Final   Influenza A NOT DETECTED NOT DETECTED Final   Influenza B NOT DETECTED NOT DETECTED Final   Parainfluenza Virus 1 NOT DETECTED NOT DETECTED Final   Parainfluenza Virus 2 NOT DETECTED NOT DETECTED Final   Parainfluenza Virus 3 NOT DETECTED NOT DETECTED Final   Parainfluenza Virus 4 NOT DETECTED NOT DETECTED Final   Respiratory Syncytial Virus NOT DETECTED NOT DETECTED Final   Bordetella pertussis NOT DETECTED NOT DETECTED Final   Chlamydophila pneumoniae NOT DETECTED NOT DETECTED Final   Mycoplasma pneumoniae NOT DETECTED NOT DETECTED Final    Comment: Performed at [facility], [address],  [place], [place] [address]          IMAGING    DG Chest Port 1 View  Result Date: 07/24/2019 CLINICAL DATA:  Check endotracheal tube placement EXAM: PORTABLE CHEST 1 VIEW COMPARISON:  07/22/2019 FINDINGS: Endotracheal tube, gastric catheter and right jugular central line are again seen in satisfactory position. The endotracheal tube is now 3.5 cm above the carina. Patchy opacities are again seen in both lungs slightly increased from the prior exam. Small effusions are noted bilaterally. Postsurgical changes are again seen. Left subclavian arterial stent is again noted. IMPRESSION: Slight increase in the degree of airspace opacities when compare with the prior exam particularly on the right. Tubes and lines as described above. Electronically  Signed   By: Inez Catalina M.D.   On: 07/24/2019 11:24   [place] EKG SITE RITE  Result Date: 07/24/2019 If Site Rite image not attached, placement could not be confirmed due to current cardiac rhythm.   CBC    Component Value Date/Time   WBC 24.6 (H) 07/24/2019 0229   RBC 2.82 (L) 07/24/2019 0229   HGB 8.1 (L) 07/24/2019 0229   HCT 26.1 (L) 07/24/2019 0229   PLT 454 (H) 07/24/2019 0229   MCV 92.6 07/24/2019 0229   MCH 28.7 07/24/2019 0229   MCHC 31.0 07/24/2019 0229   RDW 17.6 (H) 07/24/2019 0229   LYMPHSABS 0.6 (L) 07/21/2019 0446   MONOABS 0.9 07/21/2019 0446   EOSABS 0.0 07/21/2019 0446   BASOSABS 0.0 07/21/2019 0446   BMP Latest Ref Rng & Units 07/24/2019 07/23/2019 07/23/2019  Glucose 70 - 99 mg/dL 237(H) 156(H) 323(H)  BUN 8 - 23 mg/dL 23 20 26(H)  Creatinine 0.44 - 1.00 mg/dL 0.80 0.81 0.99  Sodium 135 - 145 mmol/L 136 136 136  Potassium 3.5 - 5.1 mmol/L 4.7 4.3 4.8  Chloride 98 - 111 mmol/L 98 101 101  CO2 22 - 32 mmol/L 27 27 24   Calcium 8.9 - 10.3 mg/dL 7.9(L) 8.0(L) 8.0(L)       ASSESSMENT AND PLAN SYNOPSIS  SEVERE ACUUTE HYPOXIC AND HYPERCAPNIC RESP FAILURE FROM ACUTE COVID 19 PNEUMONITIS AND SEEVERE COPD EXACERBATION WITH SEVERE SYSTOLIC CHF EXACERBATION WITH PROGRESSIVE CARDIORENAL SYNDROME  Severe ACUTE Hypoxic and Hypercapnic Respiratory Failure -continue Mechanical Ventilator support -continue Bronchodilator Therapy -Wean Fio2 and PEEP as tolerated -VAP/VENT bundle implementation -will perform SAT/SBT when respiratory parameters are met   KIDNEY INJURY/Renal Failure -follow chem 7 -follow UO -continue Foley Catheter-assess need -Avoid nephrotoxic agents On CRRT   Cardiogenic  shock -use vasopressors to keep MAP>65 -follow ABG and LA stress dose steroids   ACUTE SYSTOLIC CARDIAC FAILURE- EF 25% NSTEMI-on heparin infusion Afib with  RVR on amiodarone   CARDIAC ICU monitoring  INFECTIOUS DISEASE Completed antibiotics    GI GI  PROPHYLAXIS as indicated  NUTRITIONAL STATUS DIET-->TF's as tolerated Constipation protocol as indicated   ENDO - ICU hypoglycemic\Hyperglycemia protocol -check FSBS per protocol      ELECTROLYTES -follow labs as needed -replace as needed -pharmacy consultation and following      DVT/GI PRX ordered TRANSFUSIONS AS NEEDED MONITOR FSBS ASSESS the need for LABS as needed     Critical Care Time devoted to patient care services described in this note is 36 minutes.   Overall, patient is critically ill, prognosis is guarded.  Patient with Multiorgan failure and at high risk for cardiac arrest and death.   I strongly recommend DNR status, patient with multiorgan failure, very poor chance of meaningful recovery.  Corrin Parker, M.D.  [facility]  Medical Director [place] Director [facility] Cardio-Pulmonary Department

## 2019-07-24 NOTE — Consult Note (Signed)
[place] for Heparin Indication: atrial fibrillation  Allergies  Allergen Reactions  . Metformin And Related Diarrhea  . Sulfur Hives    Patient Measurements: Height: 5\' 2"  (157.5 cm) Weight: 168 lb 3.4 oz (76.3 kg) IBW/kg (Calculated) : 50.1 Heparin Dosing Weight:  71.8 kg (actual body weight)  Vital Signs: Temp: 98.8 F (37.1 C) (01/28 1200) Temp Source: Axillary (01/28 1200) BP: 115/53 (01/28 1500) Pulse Rate: 88 (01/28 1500)  Labs: Recent Labs    07/22/19 1942 07/22/19 1942 07/23/19 0512 07/23/19 1216 07/23/19 1737 07/23/19 2130 07/24/19 0229 07/24/19 0423 07/24/19 1112  HGB 8.9*   < > 8.9*  --   --   --  8.1*  --   --   HCT 28.4*  --  29.4*  --   --   --  26.1*  --   --   PLT 566*  --  460*  --   --   --  454*  --   --   APTT 106*   < > 103*   < >  --  64* 59*  --  90*  HEPARINUNFRC  --   --  >3.60*  --   --   --   --   --   --   CREATININE  --    < > 0.99  --  0.81  --   --  0.80  --    < > = values in this interval not displayed.    Estimated Creatinine Clearance: 60.8 mL/min (by C-G formula based on SCr of 0.8 mg/dL).   Medical History: Past Medical History:  Diagnosis Date  . Arrhythmia    atrial fibrillation  . CHF (congestive heart failure) ([place])   . COPD (chronic obstructive pulmonary disease) ([place])   . Diabetes mellitus without complication ([place])   . Heart attack ([place])   . Hypertension   . Renal disorder     Assessment: 74 year-old female admitted with COVID 19 pneumonitis, severe COPD, and multi-organ failure with NSTEMI and atrial fibrillation rate-controlled with amiodarone.   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  She is currently on apixaban at home for chronic atrial fibrillation.  Last inpatient dose of apixaban 1/24 @ 2200.  Will continue to monitor hemoglobin, which is low in the setting of CKD.  Patient received transfusion 1/26, returning  hemoglobin to 8.9.  Baseline Hgb appears to be 8-8.4.  Patient is intubated with a central line.  She is receiving CRRT currently.    Goal of Therapy:  Goal APTT level is 66-102 seconds. Heparin level 0.3-0.7 units/ml  Monitor platelets by anticoagulation protocol: Yes   Plan:  Heparin stopped per Dr. Mortimer Fries.  Patient with new appearance of blood in ETT suctioning.  Will d/c order for now, and follow up with heparin plan on ICU rounds.  Patient is currently on SCDs.    Gerald Dexter, PharmD 07/24/2019 3:03 PM

## 2019-07-24 NOTE — Consult Note (Signed)
[name] for Heparin Indication: atrial fibrillation  Allergies  Allergen Reactions  . Metformin And Related Diarrhea  . Sulfur Hives    Patient Measurements: Height: 5\' 2"  (157.5 cm) Weight: 168 lb 3.4 oz (76.3 kg) IBW/kg (Calculated) : 50.1 Heparin Dosing Weight:  71.8 kg (actual body weight)  Vital Signs: Temp: 98.4 F (36.9 C) (01/27 1926) Temp Source: Axillary (01/27 1926) BP: 110/47 (01/28 0400) Pulse Rate: 95 (01/28 0400)  Labs: Recent Labs    07/21/19 1246 07/22/19 0132 07/22/19 0408 07/22/19 1600 07/22/19 1942 07/22/19 1942 07/23/19 0512 07/23/19 0512 07/23/19 1216 07/23/19 1737 07/23/19 2130 07/24/19 0229  HGB  --    < >   < >  --  8.9*   < > 8.9*  --   --   --   --  8.1*  HCT  --    < >   < >  --  28.4*  --  29.4*  --   --   --   --  26.1*  PLT  --    < >   < >  --  566*  --  460*  --   --   --   --  454*  APTT  --    < >   < >  --  106*   < > 103*   < > 63*  --  64* 59*  LABPROT 30.3*  --   --   --   --   --   --   --   --   --   --   --   INR 2.9*  --   --   --   --   --   --   --   --   --   --   --   HEPARINUNFRC  --   --   --   --   --   --  >3.60*  --   --   --   --   --   CREATININE  --    < >  --  1.02*  --   --  0.99  --   --  0.81  --   --    < > = values in this interval not displayed.    Estimated Creatinine Clearance: 60.1 mL/min (by C-G formula based on SCr of 0.81 mg/dL).   Medical History: Past Medical History:  Diagnosis Date  . Arrhythmia    atrial fibrillation  . CHF (congestive heart failure) ([place])   . COPD (chronic obstructive pulmonary disease) ([place])   . Diabetes mellitus without complication ([place])   . Heart attack ([place])   . Hypertension   . Renal disorder     Assessment: 74 year-old female admitted with COVID 19 pneumonitis, severe COPD, and multi-organ failure with NSTEMI and atrial fibrillation rate-controlled with amiodarone.   Cardiac history is significant for recent CABG  (07/2018) and prior pulmonary embolism.  She is currently on apixaban at home for chronic atrial fibrillation.  Last inpatient dose of apixaban 1/24 @ 2200.  Will continue to monitor hemoglobin, which is low in the setting of CKD.  Patient received transfusion 1/26, returning hemoglobin to 8.9.  Baseline Hgb appears to be 8-8.4.  Patient is intubated with a central line.  She is receiving CRRT currently.    Goal of Therapy:  Goal APTT level is 66-102 seconds. Heparin level 0.3-0.7 units/ml  Monitor platelets by anticoagulation  protocol: Yes   Plan:  01/28 @ 0300 aPTT 59 seconds slightly below goal. Will increase rate to 600 units/hr and will recheck aPTT at 1100, CBC trending down will continue to monitor.  Andrea [name], PharmD 07/24/2019 4:42 AM

## 2019-07-25 DIAGNOSIS — L8990 Pressure ulcer of unspecified site, unspecified stage: Secondary | ICD-10-CM | POA: Insufficient documentation

## 2019-07-25 DIAGNOSIS — Z515 Encounter for palliative care: Secondary | ICD-10-CM

## 2019-07-25 LAB — GLUCOSE, CAPILLARY
Glucose-Capillary: 109 mg/dL — ABNORMAL HIGH (ref 70–99)
Glucose-Capillary: 116 mg/dL — ABNORMAL HIGH (ref 70–99)
Glucose-Capillary: 129 mg/dL — ABNORMAL HIGH (ref 70–99)
Glucose-Capillary: 210 mg/dL — ABNORMAL HIGH (ref 70–99)
Glucose-Capillary: 241 mg/dL — ABNORMAL HIGH (ref 70–99)
Glucose-Capillary: 74 mg/dL (ref 70–99)

## 2019-07-25 LAB — RENAL FUNCTION PANEL
Albumin: 2.5 g/dL — ABNORMAL LOW (ref 3.5–5.0)
Albumin: 2.5 g/dL — ABNORMAL LOW (ref 3.5–5.0)
Anion gap: 8 (ref 5–15)
Anion gap: 7 (ref 5–15)
BUN: 31 mg/dL — ABNORMAL HIGH (ref 8–23)
BUN: 31 mg/dL — ABNORMAL HIGH (ref 8–23)
CO2: 27 mmol/L (ref 22–32)
CO2: 28 mmol/L (ref 22–32)
Calcium: 7.5 mg/dL — ABNORMAL LOW (ref 8.9–10.3)
Calcium: 7.7 mg/dL — ABNORMAL LOW (ref 8.9–10.3)
Chloride: 101 mmol/L (ref 98–111)
Chloride: 100 mmol/L (ref 98–111)
Creatinine, Ser: 1.13 mg/dL — ABNORMAL HIGH (ref 0.44–1.00)
Creatinine, Ser: 1.09 mg/dL — ABNORMAL HIGH (ref 0.44–1.00)
GFR calc Af Amer: 56 mL/min — ABNORMAL LOW (ref >60)
GFR calc Af Amer: 59 mL/min — ABNORMAL LOW (ref >60)
GFR calc non Af Amer: 49 mL/min — ABNORMAL LOW (ref >60)
GFR calc non Af Amer: 51 mL/min — ABNORMAL LOW (ref >60)
Glucose, Bld: 181 mg/dL — ABNORMAL HIGH (ref 70–99)
Glucose, Bld: 118 mg/dL — ABNORMAL HIGH (ref 70–99)
Phosphorus: 3.3 mg/dL (ref 2.5–4.6)
Phosphorus: 3.7 mg/dL (ref 2.5–4.6)
Potassium: 4.9 mmol/L (ref 3.5–5.1)
Potassium: 4.9 mmol/L (ref 3.5–5.1)
Sodium: 136 mmol/L (ref 135–145)
Sodium: 135 mmol/L (ref 135–145)

## 2019-07-25 LAB — MAGNESIUM: Magnesium: 2.5 mg/dL — ABNORMAL HIGH (ref 1.7–2.4)

## 2019-07-25 LAB — CBC
HCT: 26.3 % — ABNORMAL LOW (ref 36.0–46.0)
Hemoglobin: 7.9 g/dL — ABNORMAL LOW (ref 12.0–15.0)
MCH: 28.8 pg (ref 26.0–34.0)
MCHC: 30 g/dL (ref 30.0–36.0)
MCV: 96 fL (ref 80.0–100.0)
Platelets: 369 10*3/uL (ref 150–400)
RBC: 2.74 MIL/uL — ABNORMAL LOW (ref 3.87–5.11)
RDW: 18 % — ABNORMAL HIGH (ref 11.5–15.5)
WBC: 32.5 10*3/uL — ABNORMAL HIGH (ref 4.0–10.5)
nRBC: 7.3 % — ABNORMAL HIGH (ref 0.0–0.2)

## 2019-07-25 MED ORDER — SENNOSIDES-DOCUSATE SODIUM 8.6-50 MG PO TABS
1.0000 | ORAL_TABLET | Freq: Two times a day (BID) | ORAL | Status: DC
  Administered 2019-07-26 – 2019-07-28 (×5): 1
  Filled 2019-07-25 (×6): qty 1

## 2019-07-25 MED ORDER — VANCOMYCIN 50 MG/ML ORAL SOLUTION
125.0000 mg | Freq: Four times a day (QID) | ORAL | Status: DC
  Administered 2019-07-25: 125 mg via ORAL
  Filled 2019-07-25 (×4): qty 2.5

## 2019-07-25 MED ORDER — INSULIN ASPART 100 UNIT/ML ~~LOC~~ SOLN
3.0000 [IU] | SUBCUTANEOUS | Status: DC
  Administered 2019-07-25 (×2): 3 [IU] via SUBCUTANEOUS
  Filled 2019-07-25 (×2): qty 1

## 2019-07-25 MED ORDER — POLYVINYL ALCOHOL 1.4 % OP SOLN
2.0000 [drp] | Freq: Three times a day (TID) | OPHTHALMIC | Status: DC
  Administered 2019-07-25 – 2019-08-01 (×20): 2 [drp] via OPHTHALMIC
  Filled 2019-07-25: qty 15

## 2019-07-25 NOTE — Progress Notes (Signed)
[facility]  ROUNDING NOTE   Subjective:  Patient remains critically ill at this time. Continues on continuous renal placement therapy. Dialysis appears to be progressing well. Still on the ventilator.   Objective:  Vital signs in last 24 hours:  Temp:  [97.5 F (36.4 C)-98.4 F (36.9 C)] 97.5 F (36.4 C) (01/29 1148) Pulse Rate:  [86-115] 115 (01/29 1330) Resp:  [10-30] 28 (01/29 1330) BP: (109-146)/(45-88) 146/62 (01/29 1330) SpO2:  [88 %-100 %] 96 % (01/29 1330) FiO2 (%):  [45 %-55 %] 45 % (01/29 1300) Weight:  [70.3 kg] 70.3 kg (01/29 0340)  Weight change: -6 kg Filed Weights   07/23/19 0432 07/24/19 0435 07/25/19 0340  Weight: 73 kg 76.3 kg 70.3 kg    Intake/Output: I/O last 3 completed shifts: In: 5465.9 [I.V.:2304.4; NG/GT:2330; IV Piggyback:831.4] Out: 5491.4 [Urine:45; Other:5446.4]   Intake/Output this shift:  Total I/O In: 604.6 [I.V.:475.8; NG/GT:90; IV Piggyback:38.8] Out: 893 [Urine:10; Other:883]  Physical Exam: General: Critically ill-appearing  Head: Normocephalic, atraumatic.  Endotracheal tube in place.  Eyes: Anicteric  Neck: Supple, trachea midline  Lungs:  Scattered rhonchi, vent assisted  Heart: S1S2 no rubs  Abdomen:  Soft, nontender, bowel sounds present  Extremities: trace peripheral edema.  Neurologic: Intubated, sedated  Skin: No lesions  Access: Temporary right IJ dialysis catheter    Basic Metabolic Panel: Recent Labs  Lab 07/21/19 0446 07/21/19 0930 07/22/19 0408 07/22/19 1600 07/23/19 0512 07/23/19 0512 07/23/19 1737 07/23/19 1737 07/24/19 0229 07/24/19 0423 07/24/19 1636 07/25/19 0426  NA 139   < > 136   < > 136  --  136  --   --  136 136 136  K 6.0*   < > 4.4   < > 4.8  --  4.3  --   --  4.7 4.8 4.9  CL 105   < > 102   < > 101  --  101  --   --  98 101 101  CO2 24   < > 22   < > 24  --  27  --   --  27 27 27   GLUCOSE 164*   < > 209*   < > 323*  --  156*  --   --  237* 222* 181*  BUN 63*   < > 49*    < > 26*  --  20  --   --  23 28* 31*  CREATININE 2.19*   < > 1.60*   < > 0.99  --  0.81  --   --  0.80 0.97 1.13*  CALCIUM 8.9   < > 8.2*   < > 8.0*   < > 8.0*   < >  --  7.9* 7.8* 7.5*  MG 1.7  --  2.3  --  2.5*  --   --   --  2.6*  --   --  2.5*  PHOS 4.9*   < > 3.3   < > 2.3*  --  3.2  --   --  2.2* 4.3 3.3   < > = values in this interval not displayed.    Liver Function Tests: Recent Labs  Lab 07/21/2019 2139 07/16/2019 2139 07/19/19 0305 07/19/19 0305 07/20/19 0500 07/20/19 0500 07/21/19 0446 07/22/19 0408 07/23/19 0512 07/23/19 1737 07/24/19 0423 07/24/19 1636 07/25/19 0426  AST 34  --  127*  --  107*  --  63*  --   --   --   --   --   --  ALT 25  --  82*  --  116*  --  94*  --   --   --   --   --   --   ALKPHOS 93  --  122  --  99  --  91  --   --   --   --   --   --   BILITOT 0.7  --  1.1  --  0.7  --  0.6  --   --   --   --   --   --   PROT 6.8  --  6.2*  --  5.9*  --  5.7*  --   --   --   --   --   --   ALBUMIN 2.3*   < > 1.9*   < > 2.0*   < > 2.3*   < > 2.5* 2.4* 2.4* 2.4* 2.5*   < > = values in this interval not displayed.   No results for input(s): LIPASE, AMYLASE in the last 168 hours. No results for input(s): AMMONIA in the last 168 hours.  CBC: Recent Labs  Lab 07/09/2019 2139 07/20/19 0500 07/21/19 0446 07/21/19 0930 07/22/19 0408 07/22/19 1942 07/23/19 0512 07/24/19 0229 07/25/19 0426  WBC 13.7*   < > 11.8*   < > 19.2* 19.7* 17.8* 24.6* 32.5*  NEUTROABS 8.2*  --  10.0*  --   --   --   --   --   --   HGB 8.4*   < > 6.9*   < > 7.0* 8.9* 8.9* 8.1* 7.9*  HCT 27.7*   < > 22.9*   < > 23.0* 28.4* 29.4* 26.1* 26.3*  MCV 92.0   < > 90.5   < > 90.9 91.0 93.9 92.6 96.0  PLT 447*   < > 349   < > 565* 566* 460* 454* 369   < > = values in this interval not displayed.    Cardiac Enzymes: No results for input(s): CKTOTAL, CKMB, CKMBINDEX, TROPONINI in the last 168 hours.  BNP: Invalid input(s): POCBNP  CBG: Recent Labs  Lab 07/24/19 1917 07/24/19 2319  07/25/19 0321 07/25/19 0738 07/25/19 1117  GLUCAP 175* 100* 116* 210* 41*    Microbiology: Results for orders placed or performed during the hospital encounter of 07/02/2019  Culture, blood (Routine x 2)     Status: None   Collection Time: 07/25/2019  9:40 PM   Specimen: BLOOD  Result Value Ref Range Status   Specimen Description BLOOD RIGHT ANTECUBITAL  Final   Special Requests   Final    BOTTLES DRAWN AEROBIC AND ANAEROBIC Blood Culture adequate volume   Culture   Final    NO GROWTH 5 DAYS Performed at [facility], [address] [place], [place] [address]    Report Status 07/23/2019 FINAL  Final  Culture, blood (Routine x 2)     Status: None   Collection Time: 07/19/2019  9:40 PM   Specimen: BLOOD  Result Value Ref Range Status   Specimen Description BLOOD LEFT ANTECUBITAL  Final   Special Requests   Final    BOTTLES DRAWN AEROBIC AND ANAEROBIC Blood Culture adequate volume   Culture   Final    NO GROWTH 5 DAYS Performed at [facility], [address], [place], [place] [address]    Report Status 07/23/2019 FINAL  Final  MRSA PCR Screening     Status: None   Collection Time: 07/19/19  6:45 AM   Specimen: Nasal Mucosa; Nasopharyngeal  Result Value Ref Range Status   MRSA by PCR NEGATIVE NEGATIVE Final    Comment:        The GeneXpert MRSA Assay (FDA approved for NASAL specimens only), is one component of a comprehensive MRSA colonization surveillance program. It is not intended to diagnose MRSA infection nor to guide or monitor treatment for MRSA infections. Performed at [facility], [address] [place], [place] [address]   Respiratory Panel by PCR     Status: None   Collection Time: 07/19/19 10:03 AM   Specimen: Nasopharyngeal Swab; Respiratory  Result Value Ref Range Status   Adenovirus NOT DETECTED NOT DETECTED Final   Coronavirus 229E NOT DETECTED NOT DETECTED Final    Comment: (NOTE) The Coronavirus on the Respiratory  Panel, DOES NOT test for the novel  Coronavirus (2019 nCoV)    Coronavirus HKU1 NOT DETECTED NOT DETECTED Final   Coronavirus NL63 NOT DETECTED NOT DETECTED Final   Coronavirus OC43 NOT DETECTED NOT DETECTED Final   Metapneumovirus NOT DETECTED NOT DETECTED Final   Rhinovirus / Enterovirus NOT DETECTED NOT DETECTED Final   Influenza A NOT DETECTED NOT DETECTED Final   Influenza B NOT DETECTED NOT DETECTED Final   Parainfluenza Virus 1 NOT DETECTED NOT DETECTED Final   Parainfluenza Virus 2 NOT DETECTED NOT DETECTED Final   Parainfluenza Virus 3 NOT DETECTED NOT DETECTED Final   Parainfluenza Virus 4 NOT DETECTED NOT DETECTED Final   Respiratory Syncytial Virus NOT DETECTED NOT DETECTED Final   Bordetella pertussis NOT DETECTED NOT DETECTED Final   Chlamydophila pneumoniae NOT DETECTED NOT DETECTED Final   Mycoplasma pneumoniae NOT DETECTED NOT DETECTED Final    Comment: Performed at [facility], [address], [place], [place] [address]    Coagulation Studies: No results for input(s): LABPROT, INR in the last 72 hours.  Urinalysis: No results for input(s): COLORURINE, LABSPEC, PHURINE, GLUCOSEU, HGBUR, BILIRUBINUR, KETONESUR, PROTEINUR, UROBILINOGEN, NITRITE, LEUKOCYTESUR in the last 72 hours.  Invalid input(s): APPERANCEUR    Imaging: DG Chest Port 1 View  Result Date: 07/24/2019 CLINICAL DATA:  Check endotracheal tube placement EXAM: PORTABLE CHEST 1 VIEW COMPARISON:  07/22/2019 FINDINGS: Endotracheal tube, gastric catheter and right jugular central line are again seen in satisfactory position. The endotracheal tube is now 3.5 cm above the carina. Patchy opacities are again seen in both lungs slightly increased from the prior exam. Small effusions are noted bilaterally. Postsurgical changes are again seen. Left subclavian arterial stent is again noted. IMPRESSION: Slight increase in the degree of airspace opacities when compare with the prior exam particularly on the right.  Tubes and lines as described above. Electronically Signed   By: Inez Catalina M.D.   On: 07/24/2019 11:24   [place] EKG SITE RITE  Result Date: 07/24/2019 If Site Rite image not attached, placement could not be confirmed due to current cardiac rhythm.    Medications:   .  prismasol BGK 4/2.5 300 mL/hr at 07/25/19 0000  .  prismasol BGK 4/2.5 200 mL/hr at 07/25/19 0000  . sodium chloride 10 mL/hr at 07/20/19 2045  . amiodarone 30 mg/hr (07/25/19 1400)  . dexmedetomidine (PRECEDEX) IV infusion 1.2 mcg/kg/hr (07/25/19 1400)  . feeding supplement (VITAL HIGH PROTEIN) 1,000 mL (07/25/19 0610)  . fentaNYL infusion INTRAVENOUS 125 mcg/hr (07/25/19 1400)  . norepinephrine (LEVOPHED) Adult infusion 14 mcg/min (07/25/19 1400)  . prismasol BGK 4/2.5 1,000 mL/hr at 07/25/19 0500  . vasopressin (PITRESSIN)  infusion - *FOR SHOCK* 0.03 Units/min (07/25/19 1400)   . atorvastatin  40 mg Per Tube q1800  . B-complex with vitamin C  1 tablet Per Tube Daily  . chlorhexidine gluconate (MEDLINE KIT)  15 mL Mouth Rinse BID  . Chlorhexidine Gluconate Cloth  6 each Topical Daily  . famotidine  20 mg Per Tube QHS  . hydrocortisone sod succinate (SOLU-CORTEF) inj  50 mg Intravenous Q6H  . insulin aspart  3 Units Subcutaneous Q4H  . insulin aspart  3-9 Units Subcutaneous Q4H  . insulin detemir  8 Units Subcutaneous BID  . ipratropium-albuterol  3 mL Nebulization BID  . mouth rinse  15 mL Mouth Rinse 10 times per day  . midodrine  10 mg Oral TID WC  . multivitamin  15 mL Per Tube Daily  . senna-docusate  2 tablet Per Tube BID   albuterol, fentaNYL, heparin, midazolam, morphine injection, phenol, sodium chloride flush  Assessment/ Plan:  74 y.o. female with a PMHx of hypertension, myocardial infarction, CABG and MRA of 2020, diabetes mellitus type 2, COPD on chronic home oxygen, chronic diastolic heart failure, chronic kidney disease stage III baseline creatinine 1.6-1.9, COVID-19 infection 05/15/2019, who was  admitted to [facility] on 07/06/2019 for evaluation of increasing shortness of breath, cough, fatigue.   1.  Acute kidney injury likely secondary to acute tubular necrosis/chronic kidney disease stage IIIb baseline creatinine 1.6-1.9.   -Urine output remains quite low at 45 cc over the preceding 24 hours.  CRRT continues to work well.  We will maintain the patient on CRRT without change in parameters at the moment.  2.  Acute respiratory failure.  Patient continues to be difficult to wean from the ventilator.  Weaning protocol as per pulmonary/critical care..  3.  Anemia unspecified.  Hemoglobin continues to drift down slowly and currently 7.9.  Defer decisions regarding transfusion to primary team.  Plan:   LOS: 7 Solei Wubben 1/29/20212:20 PM

## 2019-07-25 NOTE — Progress Notes (Signed)
CRRT stopped for routine cartridge change. Blood returned to patient and the patient's HD lines were heparin locked during the change. Re-initiated treatment at [id number] without complications. Will continue to monitor.  Cameron Ali, RN

## 2019-07-25 NOTE — Progress Notes (Signed)
CRITICAL CARE NOTE  BRIEF PATIENT DESCRIPTION:  74 yo female dx with COVID-19 04/2019 admitted with acute on chronic renal failure with hyperkalemia, elevated troponin, and acute on chronic hypoxic hypercapnic respiratory failure secondary to bacterial pneumonia vs. COVID-19 pneumonitis requiring mechanical intubation     SYNOPSIS This is a 74 yo female with a PMH of HTN, MI, CABG (07/2018)Type II Diabetes Mellitus, COPD, Chronic Home O2 @2L , Chronic Atrial Fibrillation (on eliquis), Chronic Diastolic CHF (EF 40 to 67% via Echo 05/2019), CKD Stage III (baseline creatinine 1.6-1.9), and COVID-19 (dx 05/15/2019). She presented to [facility] ER via EMS on 01/22 with worsening shortness of breath, cough, and fatigue. At baseline pt wears chronic home O2 @2L , however due to symptoms she has had to increase her O2 to 4L 24hrs prior to current ER presentation. En route to the ER EMS placed pt on 6L O2 and administered albuterol. Upon arrival to the ER pt tachypneic with increased work of breathing requiring iv steroids, duonebs, and HFNC. Lab results revealed Na+ 133, CO2 21, BUN 38, creatinine 1.78, glucose 300, anion gap 17, troponin 90, lactic acid 6.2, pct 1.11, wbc 13.7, hgb 8.4, and vbg pH 7.38/pCO2 42. CXR concerning for multifocal pneumonia and small bilateral pleural effusions. She received solumedrol, vancomycin, and cefepime. She was subsequently admitted to the stepdown unit per hospitalist team for additional workup and treatment, however remained in the ER pending bed availability. On 01/23 pt developed worsening acute hypoxic respiratory failure possibly due to flash pulmonary edema after receiving a total of 3L of fluids requiring mechanical intubation. PCCM contacted to assume care.  Pt does have a hx of COVID-19 diagnosed 05/14/2020, which required hospitalization/treatment at [facility]. Pt discharged home on 05/20/2019. However, she required hospitalization again at [facility] on  05/31/2019 due to multifocal pneumonia, following treatment she was discharged home on 06/29/2019.       CC  follow up respiratory failure  SUBJECTIVE Patient remains critically ill Prognosis is guarded On multiple pressors On crrt Severe hypoxia    BP (!) 123/53   Pulse 89   Temp 98.2 F (36.8 C) (Axillary)   Resp 12   Ht 5' 2"  (1.575 m)   Wt 70.3 kg   SpO2 92%   BMI 28.35 kg/m    I/O last 3 completed shifts: In: 5465.9 [I.V.:2304.4; NG/GT:2330; IV Piggyback:831.4] Out: 5491.4 [Urine:45; Other:5446.4] No intake/output data recorded.  SpO2: 92 % O2 Flow Rate (L/min): 50 L/min FiO2 (%): 55 %   SIGNIFICANT EVENTS 01/22: Pt admitted to the stepdown unit with acute hypoxic respiratory failure secondary to multifocal pneumonia, however remained in the ER pending bed availability 01/23: Pt developed worsening acute hypoxic respiratory failure requiring emergent mechanical intubation in the ER. PCCM contacted to assume care 1/24 - parameters for SBT met, patient s/p weaning trial with successful liberation from MV. Updated daughter [name].  1/25 increased WOB and back on biPAP, re-intubated, started on CRRT, family updated multiple times 1/26 severe multiorgan failure, cardiogenic shock, on CRRT, multiple vasopressors 1/27 via multiorgan failure and cardiogenic shock CRRT multiple pressors 1/28 fio2 increased to 50%, unable to perform SAT/SBT Remains in cardiogenic shock On CRRT and multiple vasopressors  REVIEW OF SYSTEMS  PATIENT IS UNABLE TO PROVIDE COMPLETE REVIEW OF SYSTEMS DUE TO SEVERE CRITICAL ILLNESS   PHYSICAL EXAMINATION:  GENERAL:critically ill appearing, +resp distress HEAD: Normocephalic, atraumatic.  EYES: Pupils equal, round, reactive to light.  No scleral icterus.  MOUTH: Moist mucosal membrane. NECK: Supple.  PULMONARY: +rhonchi, +  wheezing CARDIOVASCULAR: S1 and S2. Regular rate and rhythm. No murmurs, rubs, or gallops.  GASTROINTESTINAL:  Soft, nontender, -distended.  Positive bowel sounds.   MUSCULOSKELETAL: No swelling, clubbing, or edema.  NEUROLOGIC: obtunded, GCS<8 SKIN:intact,warm,dry  MEDICATIONS: I have reviewed all medications and confirmed regimen as documented   CULTURE RESULTS   Recent Results (from the past 240 hour(s))  Culture, blood (Routine x 2)     Status: None   Collection Time: 07/27/2019  9:40 PM   Specimen: BLOOD  Result Value Ref Range Status   Specimen Description BLOOD RIGHT ANTECUBITAL  Final   Special Requests   Final    BOTTLES DRAWN AEROBIC AND ANAEROBIC Blood Culture adequate volume   Culture   Final    NO GROWTH 5 DAYS Performed at [facility], [address] [place], [place] [address]    Report Status 07/23/2019 FINAL  Final  Culture, blood (Routine x 2)     Status: None   Collection Time: 07/04/2019  9:40 PM   Specimen: BLOOD  Result Value Ref Range Status   Specimen Description BLOOD LEFT ANTECUBITAL  Final   Special Requests   Final    BOTTLES DRAWN AEROBIC AND ANAEROBIC Blood Culture adequate volume   Culture   Final    NO GROWTH 5 DAYS Performed at [facility], [address] [place], [place] [address]    Report Status 07/23/2019 FINAL  Final  MRSA PCR Screening     Status: None   Collection Time: 07/19/19  6:45 AM   Specimen: Nasal Mucosa; Nasopharyngeal  Result Value Ref Range Status   MRSA by PCR NEGATIVE NEGATIVE Final    Comment:        The GeneXpert MRSA Assay (FDA approved for NASAL specimens only), is one component of a comprehensive MRSA colonization surveillance program. It is not intended to diagnose MRSA infection nor to guide or monitor treatment for MRSA infections. Performed at [facility], [address] [place], [place] [address]   Respiratory Panel by PCR     Status: None   Collection Time: 07/19/19 10:03 AM   Specimen: Nasopharyngeal Swab; Respiratory  Result Value Ref Range Status   Adenovirus NOT DETECTED  NOT DETECTED Final   Coronavirus 229E NOT DETECTED NOT DETECTED Final    Comment: (NOTE) The Coronavirus on the Respiratory Panel, DOES NOT test for the novel  Coronavirus (2019 nCoV)    Coronavirus HKU1 NOT DETECTED NOT DETECTED Final   Coronavirus NL63 NOT DETECTED NOT DETECTED Final   Coronavirus OC43 NOT DETECTED NOT DETECTED Final   Metapneumovirus NOT DETECTED NOT DETECTED Final   Rhinovirus / Enterovirus NOT DETECTED NOT DETECTED Final   Influenza A NOT DETECTED NOT DETECTED Final   Influenza B NOT DETECTED NOT DETECTED Final   Parainfluenza Virus 1 NOT DETECTED NOT DETECTED Final   Parainfluenza Virus 2 NOT DETECTED NOT DETECTED Final   Parainfluenza Virus 3 NOT DETECTED NOT DETECTED Final   Parainfluenza Virus 4 NOT DETECTED NOT DETECTED Final   Respiratory Syncytial Virus NOT DETECTED NOT DETECTED Final   Bordetella pertussis NOT DETECTED NOT DETECTED Final   Chlamydophila pneumoniae NOT DETECTED NOT DETECTED Final   Mycoplasma pneumoniae NOT DETECTED NOT DETECTED Final    Comment: Performed at [facility], [address], [place], [place] [address]          IMAGING    DG Chest Port 1 View  Result Date: 07/24/2019 CLINICAL DATA:  Check endotracheal tube  placement EXAM: PORTABLE CHEST 1 VIEW COMPARISON:  07/22/2019 FINDINGS: Endotracheal tube, gastric catheter and right jugular central line are again seen in satisfactory position. The endotracheal tube is now 3.5 cm above the carina. Patchy opacities are again seen in both lungs slightly increased from the prior exam. Small effusions are noted bilaterally. Postsurgical changes are again seen. Left subclavian arterial stent is again noted. IMPRESSION: Slight increase in the degree of airspace opacities when compare with the prior exam particularly on the right. Tubes and lines as described above. Electronically Signed   By: Inez Catalina M.D.   On: 07/24/2019 11:24   [place] EKG SITE RITE  Result Date: 07/24/2019 If Site  Rite image not attached, placement could not be confirmed due to current cardiac rhythm.      Indwelling Urinary Catheter continued, requirement due to   Reason to continue Indwelling Urinary Catheter strict Intake/Output monitoring for hemodynamic instability   Central Line/ continued, requirement due to  Reason to continue [place] of central venous pressure or other hemodynamic parameters and poor IV access   Ventilator continued, requirement due to severe respiratory failure   Ventilator Sedation RASS 0 to -2      ASSESSMENT AND PLAN SYNOPSIS  SEVERE ACUUTE HYPOXIC AND HYPERCAPNIC RESP FAILURE FROM ACUTE COVID 19 PNEUMONITIS AND SEEVERE COPD EXACERBATION WITH SEVERE SYSTOLIC CHF EXACERBATION WITH PROGRESSIVE CARDIORENAL SYNDROME   Severe ACUTE Hypoxic and Hypercapnic Respiratory Failure -continue Full MV support -continue Bronchodilator Therapy -Wean Fio2 and PEEP as tolerated   ACUTE SYSTOLIC CARDIAC FAILURE- EF 25% NSTEMI AFIB WITH RVR   CARDIOGENIC SHOCK/ Septic shock -use vasopressors to keep MAP>65 -follow ABG and LA -follow up cultures -emperic ABX -stress dose steroids    ACUTE KIDNEY INJURY/Renal Failure -follow chem 7 -follow UO -continue Foley Catheter-assess need -Avoid nephrotoxic agents -Recheck creatinine  On CRRT   NEUROLOGY - intubated and sedated - minimal sedation to achieve a RASS goal: -1 Wake up assessment pending   CARDIAC ICU monitoring  ID -continue IV abx as prescibed -follow up cultures Zosyn and oral vancomycin started  GI GI PROPHYLAXIS as indicated  NUTRITIONAL STATUS DIET-->TF's as tolerated Constipation protocol as indicated  ENDO - will use ICU hypoglycemic\Hyperglycemia protocol if indicated   ELECTROLYTES -follow labs as needed -replace as needed -pharmacy consultation and following   DVT/GI PRX ordered TRANSFUSIONS AS NEEDED MONITOR FSBS ASSESS the need for LABS as  needed   Critical Care Time devoted to patient care services described in this note is 38 minutes.   Overall, patient is critically ill, prognosis is guarded.  Patient with Multiorgan failure and at high risk for cardiac arrest and death.    Corrin Parker, M.D.  [facility]  Medical Director [place] Director [facility] Cardio-Pulmonary Department

## 2019-07-25 NOTE — Progress Notes (Signed)
Daily Progress Note   Patient Name: Andrea [name]       Date: 07/25/2019 DOB: [birth year]  Age: 74 y.o. MRN#: [id number] Attending Physician: Flora Lipps, MD Primary Care Physician: Perrin Maltese, MD Admit Date: 07/11/2019  Reason for Consultation/Follow-up: Establishing goals of care  Subjective: Nurses at bedside, no family present. Continues on ventilator and CRRT. Remains on pressors.   Length of Stay: 7  Current Medications: Scheduled Meds:  . atorvastatin  40 mg Per Tube q1800  . B-complex with vitamin C  1 tablet Per Tube Daily  . chlorhexidine gluconate (MEDLINE KIT)  15 mL Mouth Rinse BID  . Chlorhexidine Gluconate Cloth  6 each Topical Daily  . famotidine  20 mg Per Tube QHS  . hydrocortisone sod succinate (SOLU-CORTEF) inj  50 mg Intravenous Q6H  . insulin aspart  2 Units Subcutaneous Q4H  . insulin aspart  3-9 Units Subcutaneous Q4H  . insulin detemir  8 Units Subcutaneous BID  . ipratropium-albuterol  3 mL Nebulization BID  . mouth rinse  15 mL Mouth Rinse 10 times per day  . midodrine  10 mg Oral TID WC  . multivitamin  15 mL Per Tube Daily  . polyethylene glycol  17 g Per Tube Daily  . senna-docusate  2 tablet Per Tube BID  . vancomycin  125 mg Oral QID    Continuous Infusions: .  prismasol BGK 4/2.5 300 mL/hr at 07/25/19 0000  .  prismasol BGK 4/2.5 200 mL/hr at 07/25/19 0000  . sodium chloride 10 mL/hr at 07/20/19 2045  . amiodarone 30 mg/hr (07/25/19 0900)  . dexmedetomidine (PRECEDEX) IV infusion 1.2 mcg/kg/hr (07/25/19 0900)  . erythromycin Stopped (07/25/19 0723)  . feeding supplement (VITAL HIGH PROTEIN) 1,000 mL (07/25/19 0610)  . fentaNYL infusion INTRAVENOUS 150 mcg/hr (07/25/19 0900)  . norepinephrine (LEVOPHED) Adult infusion 14 mcg/min (07/25/19 0900)  .  piperacillin-tazobactam (ZOSYN)  IV Stopped (07/25/19 7342)  . prismasol BGK 4/2.5 1,000 mL/hr at 07/25/19 0500  . vasopressin (PITRESSIN) infusion - *FOR SHOCK* 0.03 Units/min (07/25/19 0900)    PRN Meds: albuterol, fentaNYL, heparin, midazolam, morphine injection, phenol, sodium chloride flush  Physical Exam Constitutional:      Appearance: She is ill-appearing.     Interventions: She is sedated and intubated.  HENT:     Head: Normocephalic and atraumatic.  Cardiovascular:     Rate and Rhythm: Normal rate.  Pulmonary:     Effort: She is intubated.  Skin:    General: Skin is warm and dry.             Vital Signs: BP (!) 115/46   Pulse 90   Temp 98.4 F (36.9 C) (Axillary)   Resp 16   Ht 5' 2"  (1.575 m)   Wt 70.3 kg   SpO2 92%   BMI 28.35 kg/m  SpO2: SpO2: 92 % O2 Device: O2 Device: Ventilator O2 Flow Rate: O2 Flow Rate (L/min): 50 L/min  Intake/output summary:   Intake/Output Summary (Last 24 hours) at 07/25/2019 0959 Last data filed at 07/25/2019 0900 Gross per 24 hour  Intake 3735.23 ml  Output 3990.1 ml  Net -254.87 ml   LBM: Last BM Date: 07/25/19 Baseline  Weight: Weight: 63.5 kg Most recent weight: Weight: 70.3 kg       Palliative Assessment/Data: PPS 30%      Patient Active Problem List   Diagnosis Date Noted  . Goals of care, counseling/discussion   . Palliative care encounter   . AF (paroxysmal atrial fibrillation) ([place]) 07/04/2019  . Type 2 diabetes mellitus with complication, without long-term current use of insulin ([place]) 07/01/2019  . Acute on chronic respiratory failure with hypoxemia ([place]) 06/23/2019  . History of 2019 novel coronavirus disease (COVID-19) 06/23/2019  . Multifocal pneumonia 06/23/2019  . Acute CHF (congestive heart failure) ([place]) 06/23/2019  . COPD with chronic bronchitis ([place]) 06/23/2019  . Non-insulin dependent type 2 diabetes mellitus ([place]) 06/23/2019  . Chronic anticoagulation 06/23/2019  . HCAP  (healthcare-associated pneumonia) 05/31/2019  . Pancytopenia ([place]) 05/17/2019  . Sepsis ([place]) 05/16/2019  . Fever 05/16/2019  . Pleural effusion with transudate 09/09/2018  . Pulmonary embolism on right ([place]) 09/09/2018  . DOE (dyspnea on exertion) 08/08/2018  . 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 Acute bronchitis with chronic obstructive pulmonary disease (COPD) ([place]) 11/15/2017  . Pneumonia 09/22/2017  . H/O solitary pulmonary nodule 04/14/2014  . Chronic right hip pain 09/01/2013  . Peptic ulcer symptoms 04/04/2013  . GERD (gastroesophageal reflux disease) 10/22/2012  . Atherosclerotic heart disease of native coronary artery without angina pectoris 10/11/2012  . Malignant neoplasm of female breast ([place]) 02/16/2011  . H/O TIA (transient ischemic attack) and stroke 11/01/2009  . CKD (chronic kidney disease) stage 3, GFR 30-59 ml/min 02/23/2009  . Hypertension, benign 11/26/2008    Palliative Care Assessment & Plan   HPI: 74 y.o.femalewith past medical history of COPD, CHF, atrial fibrillation, CAD s/p CABG 2020, h/o MI, h/o PE, hypertension, CKD stage 3, diabetes, breast cancer in remissionadmitted on 1/22/2021with shortness of breath.Hospitalization 3 times over Nov-Dec 2020 with COVID pneumonia. Reportedly had steady improvement until ~3 days prior to this admission when she became more short of breath and with dry cough and diagnosed with multifocal pneumonia. Acute decompensation and required intubation 1/23 and was extubated 1/24 but now with increased work of breathing and requiring BiPAP. Continues to be high risk for re-intubation with COVID pneumonitis and severe COPD exacerbation.07/21/19 Re-intubated after my discussion and visit on 07/22/19 on CRRT and vent support as well as requiring vasopressor support and amiodarone infusion.  Assessment: After speaking with ICU team and assessing Ms. [name], I spoke with her daughter [name] over the phone.  [name] is a Marine scientist and has a  good understanding of the situation and asks appropriate questions. We discussed that patient's condition essentially remains unchanged. Discussed worsening leukocytosis. Discussed current management. All questions addressed.   [name] understands the severity of her mother's illness. She is clear that they would not want a trach; however, they would like to continue with current care. She notes that she is attempting to honor her mother's previously stated wishes. [name] is hopeful for improvement but also realistic about the gravity of the situation.   Remains a concern that patient will be unable to extubate successfully and family is aware of this concern.   [name] is aware that PMT provider will follow up with her next week. RN updated on conversation with family.  Recommendations/Plan:  Family aware of poor prognosis, do not want trach, but would like to continue current care for now to see how patient responds   Palliative to follow up next week  Goals of Care and Additional Recommendations:  Limitations  on Scope of Treatment: No Tracheostomy  Code Status:  Full code  Care plan was discussed with Dr. Mortimer Fries, RN, daughter [name]  Thank you for allowing the Palliative Medicine Team to assist in the care of this patient.   Total Time 35 minutes Prolonged Time Billed  no       Greater than 50%  of this time was spent counseling and coordinating care related to the above assessment and plan.  Juel Burrow, DNP, [facility] Palliative Medicine Team Team Phone # [phone]  Pager [phone]

## 2019-07-25 NOTE — Consult Note (Signed)
Pharmacy has been consulted to monitor medication adjustments for CRRT.  No adjustments needed at this time.   Gerald Dexter, PharmD 07/25/2019 3:48 PM

## 2019-07-25 NOTE — Progress Notes (Signed)
Pt unable to tolerate spontaneous breathing this morning. Follows commands on WUA. Family at bedside, updated on POC. Artificial tears ordered as pt continuously keeps eyes open. VSS. CRRT intact.

## 2019-07-26 ENCOUNTER — Encounter: Payer: Self-pay | Admitting: Obstetrics and Gynecology

## 2019-07-26 LAB — RENAL FUNCTION PANEL
Albumin: 2.4 g/dL — ABNORMAL LOW (ref 3.5–5.0)
Albumin: 2.5 g/dL — ABNORMAL LOW (ref 3.5–5.0)
Albumin: 2.4 g/dL — ABNORMAL LOW (ref 3.5–5.0)
Albumin: 2.3 g/dL — ABNORMAL LOW (ref 3.5–5.0)
Anion gap: 7 (ref 5–15)
Anion gap: 9 (ref 5–15)
Anion gap: 8 (ref 5–15)
Anion gap: 8 (ref 5–15)
BUN: 33 mg/dL — ABNORMAL HIGH (ref 8–23)
BUN: 37 mg/dL — ABNORMAL HIGH (ref 8–23)
BUN: 39 mg/dL — ABNORMAL HIGH (ref 8–23)
BUN: 41 mg/dL — ABNORMAL HIGH (ref 8–23)
CO2: 26 mmol/L (ref 22–32)
CO2: 26 mmol/L (ref 22–32)
CO2: 27 mmol/L (ref 22–32)
CO2: 26 mmol/L (ref 22–32)
Calcium: 7.6 mg/dL — ABNORMAL LOW (ref 8.9–10.3)
Calcium: 7.6 mg/dL — ABNORMAL LOW (ref 8.9–10.3)
Calcium: 7.7 mg/dL — ABNORMAL LOW (ref 8.9–10.3)
Calcium: 7.5 mg/dL — ABNORMAL LOW (ref 8.9–10.3)
Chloride: 101 mmol/L (ref 98–111)
Chloride: 102 mmol/L (ref 98–111)
Chloride: 102 mmol/L (ref 98–111)
Chloride: 103 mmol/L (ref 98–111)
Creatinine, Ser: 1.25 mg/dL — ABNORMAL HIGH (ref 0.44–1.00)
Creatinine, Ser: 1.15 mg/dL — ABNORMAL HIGH (ref 0.44–1.00)
Creatinine, Ser: 1.1 mg/dL — ABNORMAL HIGH (ref 0.44–1.00)
Creatinine, Ser: 1.24 mg/dL — ABNORMAL HIGH (ref 0.44–1.00)
GFR calc Af Amer: 50 mL/min — ABNORMAL LOW (ref >60)
GFR calc Af Amer: 55 mL/min — ABNORMAL LOW (ref >60)
GFR calc Af Amer: 58 mL/min — ABNORMAL LOW (ref >60)
GFR calc Af Amer: 50 mL/min — ABNORMAL LOW (ref >60)
GFR calc non Af Amer: 43 mL/min — ABNORMAL LOW (ref >60)
GFR calc non Af Amer: 47 mL/min — ABNORMAL LOW (ref >60)
GFR calc non Af Amer: 50 mL/min — ABNORMAL LOW (ref >60)
GFR calc non Af Amer: 43 mL/min — ABNORMAL LOW (ref >60)
Glucose, Bld: 205 mg/dL — ABNORMAL HIGH (ref 70–99)
Glucose, Bld: 181 mg/dL — ABNORMAL HIGH (ref 70–99)
Glucose, Bld: 133 mg/dL — ABNORMAL HIGH (ref 70–99)
Glucose, Bld: 106 mg/dL — ABNORMAL HIGH (ref 70–99)
Phosphorus: 3.3 mg/dL (ref 2.5–4.6)
Phosphorus: 3.5 mg/dL (ref 2.5–4.6)
Phosphorus: 3.6 mg/dL (ref 2.5–4.6)
Phosphorus: 3.7 mg/dL (ref 2.5–4.6)
Potassium: 5.4 mmol/L — ABNORMAL HIGH (ref 3.5–5.1)
Potassium: 4.8 mmol/L (ref 3.5–5.1)
Potassium: 5.1 mmol/L (ref 3.5–5.1)
Potassium: 5.5 mmol/L — ABNORMAL HIGH (ref 3.5–5.1)
Sodium: 134 mmol/L — ABNORMAL LOW (ref 135–145)
Sodium: 137 mmol/L (ref 135–145)
Sodium: 137 mmol/L (ref 135–145)
Sodium: 137 mmol/L (ref 135–145)

## 2019-07-26 LAB — GLUCOSE, CAPILLARY
Glucose-Capillary: 160 mg/dL — ABNORMAL HIGH (ref 70–99)
Glucose-Capillary: 226 mg/dL — ABNORMAL HIGH (ref 70–99)
Glucose-Capillary: 165 mg/dL — ABNORMAL HIGH (ref 70–99)
Glucose-Capillary: 154 mg/dL — ABNORMAL HIGH (ref 70–99)
Glucose-Capillary: 97 mg/dL (ref 70–99)

## 2019-07-26 LAB — CBC WITH DIFFERENTIAL/PLATELET
Abs Immature Granulocytes: 1.4 10*3/uL — ABNORMAL HIGH (ref 0.00–0.07)
Basophils Absolute: 0 10*3/uL (ref 0.0–0.1)
Basophils Relative: 0 %
Eosinophils Absolute: 0.3 10*3/uL (ref 0.0–0.5)
Eosinophils Relative: 1 %
HCT: 26.1 % — ABNORMAL LOW (ref 36.0–46.0)
Hemoglobin: 8 g/dL — ABNORMAL LOW (ref 12.0–15.0)
Lymphocytes Relative: 6 %
Lymphs Abs: 2 10*3/uL (ref 0.7–4.0)
MCH: 29.7 pg (ref 26.0–34.0)
MCHC: 30.7 g/dL (ref 30.0–36.0)
MCV: 97 fL (ref 80.0–100.0)
Metamyelocytes Relative: 2 %
Monocytes Absolute: 2 10*3/uL — ABNORMAL HIGH (ref 0.1–1.0)
Monocytes Relative: 6 %
Neutro Abs: 28.3 10*3/uL — ABNORMAL HIGH (ref 1.7–7.7)
Neutrophils Relative %: 83 %
Platelets: 242 10*3/uL (ref 150–400)
Promyelocytes Relative: 2 %
RBC: 2.69 MIL/uL — ABNORMAL LOW (ref 3.87–5.11)
RDW: 18.6 % — ABNORMAL HIGH (ref 11.5–15.5)
WBC: 34.1 10*3/uL — ABNORMAL HIGH (ref 4.0–10.5)
nRBC: 8 % — ABNORMAL HIGH (ref 0.0–0.2)
nRBC: 9 /100 WBC — ABNORMAL HIGH

## 2019-07-26 LAB — POTASSIUM: Potassium: 5.3 mmol/L — ABNORMAL HIGH (ref 3.5–5.1)

## 2019-07-26 LAB — PROCALCITONIN: Procalcitonin: 0.35 ng/mL

## 2019-07-26 LAB — MAGNESIUM: Magnesium: 2.5 mg/dL — ABNORMAL HIGH (ref 1.7–2.4)

## 2019-07-26 MED ORDER — PIPERACILLIN-TAZOBACTAM 3.375 G IVPB 30 MIN
3.3750 g | Freq: Four times a day (QID) | INTRAVENOUS | Status: DC
  Administered 2019-07-26 – 2019-08-01 (×25): 3.375 g via INTRAVENOUS
  Filled 2019-07-26 (×30): qty 50

## 2019-07-26 NOTE — Progress Notes (Signed)
CRITICAL CARE NOTE  BRIEF PATIENT DESCRIPTION:  74 yo female dx with COVID-19 04/2019 admitted with acute on chronic renal failure with hyperkalemia, elevated troponin, and acute on chronic hypoxic hypercapnic respiratory failure secondary to bacterial pneumonia vs. COVID-19 pneumonitis requiring mechanical intubation     SYNOPSIS This is a 74 yo female with a PMH of HTN, MI, CABG (07/2018)Type II Diabetes Mellitus, COPD, Chronic Home O2 @2L , Chronic Atrial Fibrillation (on eliquis), Chronic Diastolic CHF (EF 40 to 11% via Echo 05/2019), CKD Stage III (baseline creatinine 1.6-1.9), and COVID-19 (dx 05/15/2019). She presented to [facility] ER via EMS on 01/22 with worsening shortness of breath, cough, and fatigue. At baseline pt wears chronic home O2 @2L , however due to symptoms she has had to increase her O2 to 4L 24hrs prior to current ER presentation. En route to the ER EMS placed pt on 6L O2 and administered albuterol. Upon arrival to the ER pt tachypneic with increased work of breathing requiring iv steroids, duonebs, and HFNC. Lab results revealed Na+ 133, CO2 21, BUN 38, creatinine 1.78, glucose 300, anion gap 17, troponin 90, lactic acid 6.2, pct 1.11, wbc 13.7, hgb 8.4, and vbg pH 7.38/pCO2 42. CXR concerning for multifocal pneumonia and small bilateral pleural effusions. She received solumedrol, vancomycin, and cefepime. She was subsequently admitted to the stepdown unit per hospitalist team for additional workup and treatment, however remained in the ER pending bed availability. On 01/23 pt developed worsening acute hypoxic respiratory failure possibly due to flash pulmonary edema after receiving a total of 3L of fluids requiring mechanical intubation. PCCM contacted to assume care.  Pt does have a hx of COVID-19 diagnosed 05/14/2020, which required hospitalization/treatment at [facility]. Pt discharged home on 05/20/2019. However, she required hospitalization again at [facility] on  05/31/2019 due to multifocal pneumonia, following treatment she was discharged home on 06/29/2019.     CC  follow up respiratory failure  SUBJECTIVE Patient remains critically ill Prognosis is guarded On vent +multiorgan failure On pressors   BP (!) 121/53   Pulse (!) 114   Temp 98.7 F (37.1 C) (Axillary)   Resp (!) 23   Ht 5' 2"  (1.575 m)   Wt 70 kg   SpO2 95%   BMI 28.23 kg/m    I/O last 3 completed shifts: In: 4939.5 [I.V.:2451.8; NG/GT:2190; IV Piggyback:297.7] Out: 5332.4 [Urine:56; Other:5276.4] Total I/O In: 73.2 [I.V.:73.2] Out: 135 [Other:135]  SpO2: 95 % O2 Flow Rate (L/min): 50 L/min FiO2 (%): 50 %   SIGNIFICANT EVENTS 01/22: Pt admitted to the stepdown unit with acute hypoxic respiratory failure secondary to multifocal pneumonia, however remained in the ER pending bed availability 01/23: Pt developed worsening acute hypoxic respiratory failure requiring emergent mechanical intubation in the ER. PCCM contacted to assume care 1/24- parameters for SBT met, patient s/p weaning trial with successful liberation from MV. Updated daughter [name].  1/25 increased WOB and back on biPAP, re-intubated, started on CRRT, family updated multiple times 1/26 severe multiorgan failure, cardiogenic shock, on CRRT, multiple vasopressors 1/27via multiorgan failure and cardiogenic shock CRRT multiple pressors 1/57fo2 increased to 50%, unable to perform SAT/SBT Remains in cardiogenic shock On CRRT and multiple vasopressors  REVIEW OF SYSTEMS  PATIENT IS UNABLE TO PROVIDE COMPLETE REVIEW OF SYSTEMS DUE TO SEVERE CRITICAL ILLNESS   PHYSICAL EXAMINATION:  GENERAL:critically ill appearing, +resp distress HEAD: Normocephalic, atraumatic.  EYES: Pupils equal, round, reactive to light.  No scleral icterus.  MOUTH: Moist mucosal membrane. NECK: Supple.  PULMONARY: +rhonchi, +wheezing  CARDIOVASCULAR: S1 and S2. Regular rate and rhythm. No murmurs, rubs, or gallops.   GASTROINTESTINAL: Soft, nontender, -distended.  Positive bowel sounds.   MUSCULOSKELETAL: No swelling, clubbing, or edema.  NEUROLOGIC: obtunded, GCS<8 SKIN:intact,warm,dry  MEDICATIONS: I have reviewed all medications and confirmed regimen as documented   CULTURE RESULTS   Recent Results (from the past 240 hour(s))  Culture, blood (Routine x 2)     Status: None   Collection Time: 07/05/2019  9:40 PM   Specimen: BLOOD  Result Value Ref Range Status   Specimen Description BLOOD RIGHT ANTECUBITAL  Final   Special Requests   Final    BOTTLES DRAWN AEROBIC AND ANAEROBIC Blood Culture adequate volume   Culture   Final    NO GROWTH 5 DAYS Performed at [facility], [address] [place], [place] [address]    Report Status 07/23/2019 FINAL  Final  Culture, blood (Routine x 2)     Status: None   Collection Time: 07/04/2019  9:40 PM   Specimen: BLOOD  Result Value Ref Range Status   Specimen Description BLOOD LEFT ANTECUBITAL  Final   Special Requests   Final    BOTTLES DRAWN AEROBIC AND ANAEROBIC Blood Culture adequate volume   Culture   Final    NO GROWTH 5 DAYS Performed at [facility], [address] [place], [place] [address]    Report Status 07/23/2019 FINAL  Final  MRSA PCR Screening     Status: None   Collection Time: 07/19/19  6:45 AM   Specimen: Nasal Mucosa; Nasopharyngeal  Result Value Ref Range Status   MRSA by PCR NEGATIVE NEGATIVE Final    Comment:        The GeneXpert MRSA Assay (FDA approved for NASAL specimens only), is one component of a comprehensive MRSA colonization surveillance program. It is not intended to diagnose MRSA infection nor to guide or monitor treatment for MRSA infections. Performed at [facility], [address] [place],  [address]   Respiratory Panel by PCR     Status: None   Collection Time: 07/19/19 10:03 AM   Specimen: Nasopharyngeal Swab; Respiratory  Result Value Ref Range Status    Adenovirus NOT DETECTED NOT DETECTED Final   Coronavirus 229E NOT DETECTED NOT DETECTED Final    Comment: (NOTE) The Coronavirus on the Respiratory Panel, DOES NOT test for the novel  Coronavirus (2019 nCoV)    Coronavirus HKU1 NOT DETECTED NOT DETECTED Final   Coronavirus NL63 NOT DETECTED NOT DETECTED Final   Coronavirus OC43 NOT DETECTED NOT DETECTED Final   Metapneumovirus NOT DETECTED NOT DETECTED Final   Rhinovirus / Enterovirus NOT DETECTED NOT DETECTED Final   Influenza A NOT DETECTED NOT DETECTED Final   Influenza B NOT DETECTED NOT DETECTED Final   Parainfluenza Virus 1 NOT DETECTED NOT DETECTED Final   Parainfluenza Virus 2 NOT DETECTED NOT DETECTED Final   Parainfluenza Virus 3 NOT DETECTED NOT DETECTED Final   Parainfluenza Virus 4 NOT DETECTED NOT DETECTED Final   Respiratory Syncytial Virus NOT DETECTED NOT DETECTED Final   Bordetella pertussis NOT DETECTED NOT DETECTED Final   Chlamydophila pneumoniae NOT DETECTED NOT DETECTED Final   Mycoplasma pneumoniae NOT DETECTED NOT DETECTED Final    Comment: Performed at [facility], [address], [place], [place] [address]      CBC    Component Value Date/Time   WBC 34.1 (H) 07/26/2019 0418   RBC 2.69 (L) 07/26/2019 0418   HGB  8.0 (L) 07/26/2019 0418   HCT 26.1 (L) 07/26/2019 0418   PLT 242 07/26/2019 0418   MCV 97.0 07/26/2019 0418   MCH 29.7 07/26/2019 0418   MCHC 30.7 07/26/2019 0418   RDW 18.6 (H) 07/26/2019 0418   LYMPHSABS 2.0 07/26/2019 0418   MONOABS 2.0 (H) 07/26/2019 0418   EOSABS 0.3 07/26/2019 0418   BASOSABS 0.0 07/26/2019 0418   BMP Latest Ref Rng & Units 07/26/2019 07/26/2019 07/25/2019  Glucose 70 - 99 mg/dL - 205(H) 118(H)  BUN 8 - 23 mg/dL - 33(H) 31(H)  Creatinine 0.44 - 1.00 mg/dL - 1.25(H) 1.09(H)  Sodium 135 - 145 mmol/L - 134(L) 135  Potassium 3.5 - 5.1 mmol/L 5.3(H) 5.4(H) 4.9  Chloride 98 - 111 mmol/L - 101 100  CO2 22 - 32 mmol/L - 26 28  Calcium 8.9 - 10.3 mg/dL - 7.6(L) 7.7(L)         Indwelling Urinary Catheter continued, requirement due to   Reason to continue Indwelling Urinary Catheter strict Intake/Output monitoring for hemodynamic instability   Central Line/ continued, requirement due to  Reason to continue [place] of central venous pressure or other hemodynamic parameters and poor IV access   Ventilator continued, requirement due to severe respiratory failure   Ventilator Sedation RASS 0 to -2      ASSESSMENT AND PLAN SYNOPSIS  SEVERE ACUTE HYPOXIC AND HYPERCAPNIC RESP FAILURE FROM ACUTE COVID 19 PNEUMONITIS AND SEEVERE COPD EXACERBATION WITH SEVERE SYSTOLIC CHF EXACERBATION WITH PROGRESSIVE CARDIORENAL SYNDROME  Severe ACUTE Hypoxic and Hypercapnic Respiratory Failure -continue Full MV support -continue Bronchodilator Therapy -Wean Fio2 and PEEP as tolerated -will perform SAT/SBT when respiratory parameters are met  ACUTE SYSTOLIC CARDIAC FAILURE- EF 25% NSTEMI-hold heparin due to bloody secretions Amiodarone infusion for afib    ACUTE KIDNEY INJURY/Renal Failure -follow chem 7 -follow UO -continue Foley Catheter-assess need -Avoid nephrotoxic agents -Recheck creatinine  On CRRT  NEUROLOGY - intubated and sedated - minimal sedation to achieve a RASS goal: -1 Wake up assessment pending   SHOCK-SEPSIS/HYPOVOLUMIC/CARDIOGENIC -use vasopressors to keep MAP>65 -follow ABG and LA -follow up cultures -emperic ABX stress dose steroids  CARDIAC ICU monitoring  ID FAMILY IS REQUESTING ABX  IV abx as prescibed -follow up cultures  GI GI PROPHYLAXIS as indicated  NUTRITIONAL STATUS DIET-->TF's as tolerated Constipation protocol as indicated  ENDO - will use ICU hypoglycemic\Hyperglycemia protocol if indicated   ELECTROLYTES -follow labs as needed -replace as needed -pharmacy consultation and following   DVT/GI PRX ordered TRANSFUSIONS AS NEEDED MONITOR FSBS ASSESS the need for LABS as  needed   Critical Care Time devoted to patient care services described in this note is 34 minutes.   Overall, patient is critically ill, prognosis is guarded.  Patient with Multiorgan failure and at high risk for cardiac arrest and death.    Very poor chance of meaningful recovery  Corrin Parker, M.D.  [facility]  Medical Director [place] Director [place] Department

## 2019-07-26 NOTE — Progress Notes (Addendum)
Andrea [name]  MRN: [id number]  DOB/AGE: December 13, 1945 74 y.o.  Primary Care Physician:Khan, Nyra Jabs, MD  Admit date: 07/09/2019  Chief Complaint:  Chief Complaint  Patient presents with  . Shortness of Breath    S-Pt presented on  07/07/2019 with  Chief Complaint  Patient presents with  . Shortness of Breath  Patient remains critically ill. Patient is on continuous renal replacement therapy Patient is on ventilator Patient is unable to offer any complaints  Medications  . atorvastatin  40 mg Per Tube q1800  . B-complex with vitamin C  1 tablet Per Tube Daily  . chlorhexidine gluconate (MEDLINE KIT)  15 mL Mouth Rinse BID  . Chlorhexidine Gluconate Cloth  6 each Topical Daily  . famotidine  20 mg Per Tube QHS  . hydrocortisone sod succinate (SOLU-CORTEF) inj  50 mg Intravenous Q6H  . insulin aspart  3-9 Units Subcutaneous Q4H  . insulin detemir  8 Units Subcutaneous BID  . ipratropium-albuterol  3 mL Nebulization BID  . mouth rinse  15 mL Mouth Rinse 10 times per day  . midodrine  10 mg Oral TID WC  . multivitamin  15 mL Per Tube Daily  . polyvinyl alcohol  2 drop Both Eyes TID  . senna-docusate  1 tablet Per Tube BID         ROS: Unable to get any data as patient is intubated   Physical Exam: Vital signs in last 24 hours: Temp:  [97.5 F (36.4 C)-99.3 F (37.4 C)] 99.3 F (37.4 C) (01/30 0800) Pulse Rate:  [72-115] 115 (01/30 1000) Resp:  [11-28] 20 (01/30 1000) BP: (111-146)/(43-74) 133/74 (01/30 1000) SpO2:  [91 %-99 %] 92 % (01/30 1000) FiO2 (%):  [45 %-55 %] 50 % (01/30 0812) Weight:  [70 kg] 70 kg (01/30 0322) Weight change: -0.3 kg Last BM Date: 07/25/19(per previous documentation)  Intake/Output from previous day: 01/29 0701 - 01/30 0700 In: 3042.4 [I.V.:1663.6; NG/GT:1340; IV Piggyback:38.8] Out: 7829 [Urine:37] Total I/O In: 527.9 [I.V.:287.9; NG/GT:240] Out: 544 [Urine:4; Other:540]   Physical Exam: General-intubated HEENTHead is  atraumatic normocephalic, ET tube in situ  Resp- No acute REsp distress, Rhonchi+ CVS- S1S2 regular in rate and rhythm GIT- BS+, soft, NT, ND EXT-t2+  LE Edema, Cyanosis Access patient has temporary right IJ dialysis catheter  Lab Results: CBC Recent Labs    07/25/19 0426 07/26/19 0418  WBC 32.5* 34.1*  HGB 7.9* 8.0*  HCT 26.3* 26.1*  PLT 369 242    BMET Recent Labs    07/25/19 1557 07/25/19 1557 07/26/19 0418 07/26/19 0547  NA 135  --  134*  --   K 4.9   < > 5.4* 5.3*  CL 100  --  101  --   CO2 28  --  26  --   GLUCOSE 118*  --  205*  --   BUN 31*  --  33*  --   CREATININE 1.09*  --  1.25*  --   CALCIUM 7.7*  --  7.6*  --    < > = values in this interval not displayed.   Creatinine trend 2021 1.5--2.0 now on CRRT 2020 1.5--2.0 2019 1.8--2.1 2018 1.3   MICRO Recent Results (from the past 240 hour(s))  Culture, blood (Routine x 2)     Status: None   Collection Time: 07/25/2019  9:40 PM   Specimen: BLOOD  Result Value Ref Range Status   Specimen Description BLOOD RIGHT ANTECUBITAL  Final   Special Requests  Final    BOTTLES DRAWN AEROBIC AND ANAEROBIC Blood Culture adequate volume   Culture   Final    NO GROWTH 5 DAYS Performed at [facility], [address] [place], [place] [address]    Report Status 07/23/2019 FINAL  Final  Culture, blood (Routine x 2)     Status: None   Collection Time: 07/19/2019  9:40 PM   Specimen: BLOOD  Result Value Ref Range Status   Specimen Description BLOOD LEFT ANTECUBITAL  Final   Special Requests   Final    BOTTLES DRAWN AEROBIC AND ANAEROBIC Blood Culture adequate volume   Culture   Final    NO GROWTH 5 DAYS Performed at [facility], [address] [place], [place] [address]    Report Status 07/23/2019 FINAL  Final  MRSA PCR Screening     Status: None   Collection Time: 07/19/19  6:45 AM   Specimen: Nasal Mucosa; Nasopharyngeal  Result Value Ref Range Status   MRSA by PCR NEGATIVE NEGATIVE  Final    Comment:        The GeneXpert MRSA Assay (FDA approved for NASAL specimens only), is one component of a comprehensive MRSA colonization surveillance program. It is not intended to diagnose MRSA infection nor to guide or monitor treatment for MRSA infections. Performed at [facility], [address] [place], [place] [address]   Respiratory Panel by PCR     Status: None   Collection Time: 07/19/19 10:03 AM   Specimen: Nasopharyngeal Swab; Respiratory  Result Value Ref Range Status   Adenovirus NOT DETECTED NOT DETECTED Final   Coronavirus 229E NOT DETECTED NOT DETECTED Final    Comment: (NOTE) The Coronavirus on the Respiratory Panel, DOES NOT test for the novel  Coronavirus (2019 nCoV)    Coronavirus HKU1 NOT DETECTED NOT DETECTED Final   Coronavirus NL63 NOT DETECTED NOT DETECTED Final   Coronavirus OC43 NOT DETECTED NOT DETECTED Final   Metapneumovirus NOT DETECTED NOT DETECTED Final   Rhinovirus / Enterovirus NOT DETECTED NOT DETECTED Final   Influenza A NOT DETECTED NOT DETECTED Final   Influenza B NOT DETECTED NOT DETECTED Final   Parainfluenza Virus 1 NOT DETECTED NOT DETECTED Final   Parainfluenza Virus 2 NOT DETECTED NOT DETECTED Final   Parainfluenza Virus 3 NOT DETECTED NOT DETECTED Final   Parainfluenza Virus 4 NOT DETECTED NOT DETECTED Final   Respiratory Syncytial Virus NOT DETECTED NOT DETECTED Final   Bordetella pertussis NOT DETECTED NOT DETECTED Final   Chlamydophila pneumoniae NOT DETECTED NOT DETECTED Final   Mycoplasma pneumoniae NOT DETECTED NOT DETECTED Final    Comment: Performed at [facility], [address], [place], [place] [address]      Lab Results  Component Value Date   CALCIUM 7.6 (L) 07/26/2019   PHOS 3.3 07/26/2019               Impression: 1)Renal  AKI secondary to ATN ATN secondary to septic shock AKI on CKD CKD stage III CKD since 2018 CKD most likely secondary to diabetes mellitus   2)  shock-hypotension  Patient requiring IV pressors     3)Anemia of chronic disease  HGb stable   4) secondary hyperparathyroidism -CKD Mineral-Bone Disorder  Phosphorus at goal.   5)Acute resp failure Pt currently requiring intubation    6) electrolytes   sodium Hyponatremic stable  potassium Hyperkalemic On CRRT   7)Acid base Co2 at goal     Plan:  Will continue CRRT. Will try to remove 1 l in 24 hours as pt is hypervolemic Pt family was at bedside I answered pt family's queries to best of my ability.     Jules Vidovich s Theador Hawthorne 07/26/2019, 11:18 AM

## 2019-07-26 NOTE — Progress Notes (Signed)
CRRT stopped for routine cartridge change due to increased filter pressures and clotting. Blood returned to patient and the patient's HD lines were heparin locked during the change. Re-initiated treatment at [id number] without complications. Will continue to monitor.  Cameron Ali, RN

## 2019-07-26 NOTE — Progress Notes (Signed)
CRRT restarted at 0000000 with no complications. Will continue to monitor.

## 2019-07-26 NOTE — Consult Note (Signed)
Pharmacy Antibiotic Note  Andrea [name] is a 74 y.o. female with medical history including MI, CAD s/p CABG, T2DM, CHF, CKD stage III, and COVID 19 (04/2019) admitted on 06/30/2019 with pneumonia. Patient is critically ill in the ICU on the ventilator for acute respiratory failure and with AKI requiring CRRT.  Pharmacy has been consulted for pip/tazo dosing.  WBC trending up to 34.1 today with left shift. Inflammatory markers elevated. Patient is afebrile.   Plan: pip/tazo 3.375 g q6h (CRRT dosing)  Continue to follow antibiotic plan. Adjust dosing as indicated for RRT / renal function.   Height: 5\' 2"  (157.5 cm) Weight: 154 lb 5.2 oz (70 kg) IBW/kg (Calculated) : 50.1  Temp (24hrs), Avg:98.5 F (36.9 C), Min:97.5 F (36.4 C), Max:99.3 F (37.4 C)  Recent Labs  Lab 07/22/19 1600 07/22/19 1942 07/23/19 0512 07/23/19 1737 07/24/19 0229 07/24/19 0423 07/24/19 1636 07/25/19 0426 07/25/19 1557 07/26/19 0418  WBC  --  19.7* 17.8*  --  24.6*  --   --  32.5*  --  34.1*  CREATININE   < >  --  0.99   < >  --  0.80 0.97 1.13* 1.09* 1.25*   < > = values in this interval not displayed.    Estimated Creatinine Clearance: 37.3 mL/min (A) (by C-G formula based on SCr of 1.25 mg/dL (H)).    Allergies  Allergen Reactions  . Metformin And Related Diarrhea  . Sulfur Hives    Antimicrobials this admission: Cefepime x 1 1/22 Vancomycin 1/22 >> 1/23 Levofloxacin 1/23 >> 1/25 Unasyn 1/25 >> 1/26 Erythromycin 1/28 >> 1/29 PO Vanc 1/29 x 1 Pip/tazo 1/26 >> 1/29, 1/30 >>  Dose adjustments this admission: n/a  Microbiology results: 1/22 BCx: NG x 5 days (2 sets) 1/23 Respiratory panel: negative  1/23 MRSA PCR: negative  Thank you for allowing pharmacy to be a part of this patient's care.  [place] Resident 07/26/2019 11:28 AM

## 2019-07-26 NOTE — Progress Notes (Signed)
Pt has remained alert to voice, CPOT 0. Pt remains on fent and precedex for anxiety and pain relief. During WUA pt was able to nod appropriately and +FC. Carita Pian, at bedside during WUA/SBT today.  Pt has remained on full vent support, FiO2 remains at 50%. Pt tolerated SBT for about 30 min today->pt became tachypneic in the 30s with abdominal accessory muscle use, pt nodded to confirm SOB. Lung sounds have transition from rhonchi to coarse crackles-> Nephro made aware and requested 1L UF, CXR in am. Pt has remained in NSR/ST to 120s. BP remains supported with vasopressin and quad strength Levo. Pt daughter has been updated by myself and Mortimer Fries, MD via telephone.

## 2019-07-26 NOTE — Progress Notes (Signed)
I have had multiple encounters with Pt, she has been sleeping each time. Daughter ( from [place]) has been bedside each visit. Each encounter has been lengthy, including prayer and conversations from patient's daughter about extended family support, cares and concerns. I gain a bit more information with each visit about this charming and close family. I will continue to follow up with patient.

## 2019-07-26 NOTE — Progress Notes (Signed)
CRRT clotted. Therapy temporarily interrupted at this time. Lines are heparin locked.

## 2019-07-26 NOTE — Consult Note (Signed)
Pharmacy has been consulted to monitor medication adjustments for CRRT.  No adjustments needed at this time.   Ronnel Zuercher A, PharmD 07/26/2019 10:09 AM

## 2019-07-26 NOTE — Progress Notes (Signed)
Verbal order received from [facility], MD to switch CRRT therapy fluids to 2K bath.

## 2019-07-27 ENCOUNTER — Inpatient Hospital Stay: Payer: Medicare Other

## 2019-07-27 LAB — RENAL FUNCTION PANEL
Albumin: 2.3 g/dL — ABNORMAL LOW (ref 3.5–5.0)
Albumin: 2.2 g/dL — ABNORMAL LOW (ref 3.5–5.0)
Albumin: 2.3 g/dL — ABNORMAL LOW (ref 3.5–5.0)
Albumin: 2.3 g/dL — ABNORMAL LOW (ref 3.5–5.0)
Anion gap: 7 (ref 5–15)
Anion gap: 10 (ref 5–15)
Anion gap: 13 (ref 5–15)
Anion gap: 6 (ref 5–15)
BUN: 41 mg/dL — ABNORMAL HIGH (ref 8–23)
BUN: 39 mg/dL — ABNORMAL HIGH (ref 8–23)
BUN: 41 mg/dL — ABNORMAL HIGH (ref 8–23)
BUN: 44 mg/dL — ABNORMAL HIGH (ref 8–23)
CO2: 27 mmol/L (ref 22–32)
CO2: 26 mmol/L (ref 22–32)
CO2: 23 mmol/L (ref 22–32)
CO2: 30 mmol/L (ref 22–32)
Calcium: 7.6 mg/dL — ABNORMAL LOW (ref 8.9–10.3)
Calcium: 7.8 mg/dL — ABNORMAL LOW (ref 8.9–10.3)
Calcium: 8.3 mg/dL — ABNORMAL LOW (ref 8.9–10.3)
Calcium: 8.3 mg/dL — ABNORMAL LOW (ref 8.9–10.3)
Chloride: 101 mmol/L (ref 98–111)
Chloride: 97 mmol/L — ABNORMAL LOW (ref 98–111)
Chloride: 99 mmol/L (ref 98–111)
Chloride: 99 mmol/L (ref 98–111)
Creatinine, Ser: 1.2 mg/dL — ABNORMAL HIGH (ref 0.44–1.00)
Creatinine, Ser: 1.24 mg/dL — ABNORMAL HIGH (ref 0.44–1.00)
Creatinine, Ser: 1.17 mg/dL — ABNORMAL HIGH (ref 0.44–1.00)
Creatinine, Ser: 1.17 mg/dL — ABNORMAL HIGH (ref 0.44–1.00)
GFR calc Af Amer: 52 mL/min — ABNORMAL LOW (ref >60)
GFR calc Af Amer: 50 mL/min — ABNORMAL LOW (ref >60)
GFR calc Af Amer: 54 mL/min — ABNORMAL LOW (ref >60)
GFR calc Af Amer: 54 mL/min — ABNORMAL LOW (ref >60)
GFR calc non Af Amer: 45 mL/min — ABNORMAL LOW (ref >60)
GFR calc non Af Amer: 43 mL/min — ABNORMAL LOW (ref >60)
GFR calc non Af Amer: 47 mL/min — ABNORMAL LOW (ref >60)
GFR calc non Af Amer: 47 mL/min — ABNORMAL LOW (ref >60)
Glucose, Bld: 210 mg/dL — ABNORMAL HIGH (ref 70–99)
Glucose, Bld: 258 mg/dL — ABNORMAL HIGH (ref 70–99)
Glucose, Bld: 254 mg/dL — ABNORMAL HIGH (ref 70–99)
Glucose, Bld: 215 mg/dL — ABNORMAL HIGH (ref 70–99)
Phosphorus: 3.7 mg/dL (ref 2.5–4.6)
Phosphorus: 3.6 mg/dL (ref 2.5–4.6)
Phosphorus: 3.3 mg/dL (ref 2.5–4.6)
Phosphorus: 3.1 mg/dL (ref 2.5–4.6)
Potassium: 5 mmol/L (ref 3.5–5.1)
Potassium: 4.8 mmol/L (ref 3.5–5.1)
Potassium: 4.5 mmol/L (ref 3.5–5.1)
Potassium: 4.4 mmol/L (ref 3.5–5.1)
Sodium: 135 mmol/L (ref 135–145)
Sodium: 133 mmol/L — ABNORMAL LOW (ref 135–145)
Sodium: 135 mmol/L (ref 135–145)
Sodium: 135 mmol/L (ref 135–145)

## 2019-07-27 LAB — GLUCOSE, CAPILLARY
Glucose-Capillary: 133 mg/dL — ABNORMAL HIGH (ref 70–99)
Glucose-Capillary: 149 mg/dL — ABNORMAL HIGH (ref 70–99)
Glucose-Capillary: 152 mg/dL — ABNORMAL HIGH (ref 70–99)
Glucose-Capillary: 170 mg/dL — ABNORMAL HIGH (ref 70–99)
Glucose-Capillary: 197 mg/dL — ABNORMAL HIGH (ref 70–99)
Glucose-Capillary: 52 mg/dL — ABNORMAL LOW (ref 70–99)
Glucose-Capillary: 85 mg/dL (ref 70–99)

## 2019-07-27 LAB — CBC
HCT: 24.3 % — ABNORMAL LOW (ref 36.0–46.0)
Hemoglobin: 7.2 g/dL — ABNORMAL LOW (ref 12.0–15.0)
MCH: 29.5 pg (ref 26.0–34.0)
MCHC: 29.6 g/dL — ABNORMAL LOW (ref 30.0–36.0)
MCV: 99.6 fL (ref 80.0–100.0)
Platelets: 125 10*3/uL — ABNORMAL LOW (ref 150–400)
RBC: 2.44 MIL/uL — ABNORMAL LOW (ref 3.87–5.11)
RDW: 19.3 % — ABNORMAL HIGH (ref 11.5–15.5)
WBC: 32 10*3/uL — ABNORMAL HIGH (ref 4.0–10.5)
nRBC: 6.7 % — ABNORMAL HIGH (ref 0.0–0.2)

## 2019-07-27 LAB — MAGNESIUM: Magnesium: 2.2 mg/dL (ref 1.7–2.4)

## 2019-07-27 MED ORDER — PRISMASOL BGK 0/2.5 32-2.5 MEQ/L IV SOLN
INTRAVENOUS | Status: DC
  Filled 2019-07-27: qty 5000

## 2019-07-27 MED ORDER — DEXTROSE 50 % IV SOLN
INTRAVENOUS | Status: AC
  Administered 2019-07-27: 12.5 g via INTRAVENOUS
  Filled 2019-07-27: qty 50

## 2019-07-27 MED ORDER — DEXTROSE 50 % IV SOLN
12.5000 g | Freq: Once | INTRAVENOUS | Status: AC
  Administered 2019-07-27: 12.5 g via INTRAVENOUS

## 2019-07-27 MED ORDER — DEXMEDETOMIDINE HCL IN NACL 400 MCG/100ML IV SOLN
0.4000 ug/kg/h | INTRAVENOUS | Status: DC
  Administered 2019-07-27: 1.2 ug/kg/h via INTRAVENOUS
  Administered 2019-07-27: 20:00:00 1.1 ug/kg/h via INTRAVENOUS
  Administered 2019-07-27: 15:00:00 1.2 ug/kg/h via INTRAVENOUS
  Administered 2019-07-28 (×3): 1.1 ug/kg/h via INTRAVENOUS
  Administered 2019-07-28: 13:00:00 1.098 ug/kg/h via INTRAVENOUS
  Administered 2019-07-28: 07:00:00 1.1 ug/kg/h via INTRAVENOUS
  Administered 2019-07-29: 0.7 ug/kg/h via INTRAVENOUS
  Administered 2019-07-29: 1.1 ug/kg/h via INTRAVENOUS
  Administered 2019-07-30 (×3): 1 ug/kg/h via INTRAVENOUS
  Administered 2019-07-31: 01:00:00 0.7 ug/kg/h via INTRAVENOUS
  Administered 2019-07-31: 0.8 ug/kg/h via INTRAVENOUS
  Administered 2019-07-31: 0.7 ug/kg/h via INTRAVENOUS
  Administered 2019-08-01: 03:00:00 0.8 ug/kg/h via INTRAVENOUS
  Filled 2019-07-27 (×16): qty 100

## 2019-07-27 NOTE — Progress Notes (Signed)
Pt has remained arousable to voice-> increased HR, RR, and movement of bilat upper extremities. CPOT 0.  Pt has remained on full vent support. FiO2 weaned to 35% from 50% today. Lung sounds clear to bilat auscultation.  Pt has remained ST in the 1-teens on cardiac monitor. BP supported with low dose quad strength levo and vasopressin.  Carita Pian, has remained at bedside throughout the day and has been updated.

## 2019-07-27 NOTE — Consult Note (Signed)
Pharmacy has been consulted to monitor medication adjustments for CRRT.  No adjustments needed at this time.   Tanaya Dunigan A, PharmD 07/27/2019 10:49 AM

## 2019-07-27 NOTE — Progress Notes (Signed)
CRITICAL CARE NOTE BRIEF PATIENT DESCRIPTION:  74 yo female dx with COVID-19 04/2019 admitted with acute on chronic renal failure with hyperkalemia, elevated troponin, and acute on chronic hypoxic hypercapnic respiratory failure secondary to bacterial pneumonia vs. COVID-19 pneumonitis requiring mechanical intubation     SYNOPSIS This is a 74 yo female with a PMH of HTN, MI, CABG (07/2018)Type II Diabetes Mellitus, COPD, Chronic Home O2 @2L , Chronic Atrial Fibrillation (on eliquis), Chronic Diastolic CHF (EF 40 to 50% via Echo 05/2019), CKD Stage III (baseline creatinine 1.6-1.9), and COVID-19 (dx 05/15/2019). She presented to [facility] ER via EMS on 01/22 with worsening shortness of breath, cough, and fatigue. At baseline pt wears chronic home O2 @2L , however due to symptoms she has had to increase her O2 to 4L 24hrs prior to current ER presentation. En route to the ER EMS placed pt on 6L O2 and administered albuterol. Upon arrival to the ER pt tachypneic with increased work of breathing requiring iv steroids, duonebs, and HFNC. Lab results revealed Na+ 133, CO2 21, BUN 38, creatinine 1.78, glucose 300, anion gap 17, troponin 90, lactic acid 6.2, pct 1.11, wbc 13.7, hgb 8.4, and vbg pH 7.38/pCO2 42. CXR concerning for multifocal pneumonia and small bilateral pleural effusions. She received solumedrol, vancomycin, and cefepime. She was subsequently admitted to the stepdown unit per hospitalist team for additional workup and treatment, however remained in the ER pending bed availability. On 01/23 pt developed worsening acute hypoxic respiratory failure possibly due to flash pulmonary edema after receiving a total of 3L of fluids requiring mechanical intubation. PCCM contacted to assume care.  Pt does have a hx of COVID-19 diagnosed 05/14/2020, which required hospitalization/treatment at [facility]. Pt discharged home on 05/20/2019. However, she required hospitalization again at [facility] on  05/31/2019 due to multifocal pneumonia, following treatment she was discharged home on 06/29/2019.        CC  follow up respiratory failure  SUBJECTIVE Patient remains critically ill Prognosis is guarded On vent +multiorgan failure On pressors    BP 118/82 (BP Location: Right Arm)   Pulse (!) 116   Temp 98.3 F (36.8 C) (Axillary)   Resp 19   Ht 5' 2"  (1.575 m)   Wt 70.7 kg   SpO2 96%   BMI 28.51 kg/m    I/O last 3 completed shifts: In: 4752.8 [I.V.:2510.3; NG/GT:1980; IV Piggyback:262.4] Out: 5397 [Urine:26; Other:5580] Total I/O In: 117.9 [I.V.:67.9; NG/GT:50] Out: -   SpO2: 96 % O2 Flow Rate (L/min): 28 L/min FiO2 (%): 55 %   SIGNIFICANT EVENTS 01/22: Pt admitted to the stepdown unit with acute hypoxic respiratory failure secondary to multifocal pneumonia, however remained in the ER pending bed availability 01/23: Pt developed worsening acute hypoxic respiratory failure requiring emergent mechanical intubation in the ER. PCCM contacted to assume care 1/24- parameters for SBT met, patient s/p weaning trial with successful liberation from MV. Updated daughter [name].  1/25 increased WOB and back on biPAP, re-intubated, started on CRRT, family updated multiple times 1/26 severe multiorgan failure, cardiogenic shock, on CRRT, multiple vasopressors 1/27via multiorgan failure and cardiogenic shock CRRT multiple pressors 1/61fo2 increased to 50%, unable to perform SAT/SBT Remains in cardiogenic shock On CRRT and multiple vasopressors 1/30 on CRRT, multiple vasopressors, multiorgan failure   REVIEW OF SYSTEMS  PATIENT IS UNABLE TO PROVIDE COMPLETE REVIEW OF SYSTEMS DUE TO SEVERE CRITICAL ILLNESS   PHYSICAL EXAMINATION:  GENERAL:critically ill appearing, +resp distress HEAD: Normocephalic, atraumatic.  EYES: Pupils equal, round, reactive to light.  No scleral icterus.  MOUTH: Moist mucosal membrane. NECK: Supple.  PULMONARY: +rhonchi,  +wheezing CARDIOVASCULAR: S1 and S2. Regular rate and rhythm. No murmurs, rubs, or gallops.  GASTROINTESTINAL: Soft, nontender, -distended.  Positive bowel sounds.   MUSCULOSKELETAL: No swelling, clubbing, or edema.  NEUROLOGIC: obtunded, GCS<8 SKIN:intact,warm,dry  MEDICATIONS: I have reviewed all medications and confirmed regimen as documented   CULTURE RESULTS   Recent Results (from the past 240 hour(s))  Culture, blood (Routine x 2)     Status: None   Collection Time: 07/24/2019  9:40 PM   Specimen: BLOOD  Result Value Ref Range Status   Specimen Description BLOOD RIGHT ANTECUBITAL  Final   Special Requests   Final    BOTTLES DRAWN AEROBIC AND ANAEROBIC Blood Culture adequate volume   Culture   Final    NO GROWTH 5 DAYS Performed at [facility], [address] [place], [place] [address]    Report Status 07/23/2019 FINAL  Final  Culture, blood (Routine x 2)     Status: None   Collection Time: 07/16/2019  9:40 PM   Specimen: BLOOD  Result Value Ref Range Status   Specimen Description BLOOD LEFT ANTECUBITAL  Final   Special Requests   Final    BOTTLES DRAWN AEROBIC AND ANAEROBIC Blood Culture adequate volume   Culture   Final    NO GROWTH 5 DAYS Performed at [facility], [address] [place], [place] [address]    Report Status 07/23/2019 FINAL  Final  MRSA PCR Screening     Status: None   Collection Time: 07/19/19  6:45 AM   Specimen: Nasal Mucosa; Nasopharyngeal  Result Value Ref Range Status   MRSA by PCR NEGATIVE NEGATIVE Final    Comment:        The GeneXpert MRSA Assay (FDA approved for NASAL specimens only), is one component of a comprehensive MRSA colonization surveillance program. It is not intended to diagnose MRSA infection nor to guide or monitor treatment for MRSA infections. Performed at [facility], [address] [place], [place] [address]   Respiratory Panel by PCR     Status: None   Collection Time: 07/19/19  10:03 AM   Specimen: Nasopharyngeal Swab; Respiratory  Result Value Ref Range Status   Adenovirus NOT DETECTED NOT DETECTED Final   Coronavirus 229E NOT DETECTED NOT DETECTED Final    Comment: (NOTE) The Coronavirus on the Respiratory Panel, DOES NOT test for the novel  Coronavirus (2019 nCoV)    Coronavirus HKU1 NOT DETECTED NOT DETECTED Final   Coronavirus NL63 NOT DETECTED NOT DETECTED Final   Coronavirus OC43 NOT DETECTED NOT DETECTED Final   Metapneumovirus NOT DETECTED NOT DETECTED Final   Rhinovirus / Enterovirus NOT DETECTED NOT DETECTED Final   Influenza A NOT DETECTED NOT DETECTED Final   Influenza B NOT DETECTED NOT DETECTED Final   Parainfluenza Virus 1 NOT DETECTED NOT DETECTED Final   Parainfluenza Virus 2 NOT DETECTED NOT DETECTED Final   Parainfluenza Virus 3 NOT DETECTED NOT DETECTED Final   Parainfluenza Virus 4 NOT DETECTED NOT DETECTED Final   Respiratory Syncytial Virus NOT DETECTED NOT DETECTED Final   Bordetella pertussis NOT DETECTED NOT DETECTED Final   Chlamydophila pneumoniae NOT DETECTED NOT DETECTED Final   Mycoplasma pneumoniae NOT DETECTED NOT DETECTED Final    Comment: Performed at [facility], [address], [place], [place] [address]        CBC    Component Value Date/Time  WBC 32.0 (H) 07/27/2019 0609   RBC 2.44 (L) 07/27/2019 0609   HGB 7.2 (L) 07/27/2019 0609   HCT 24.3 (L) 07/27/2019 0609   PLT 125 (L) 07/27/2019 0609   MCV 99.6 07/27/2019 0609   MCH 29.5 07/27/2019 0609   MCHC 29.6 (L) 07/27/2019 0609   RDW 19.3 (H) 07/27/2019 0609   LYMPHSABS 2.0 07/26/2019 0418   MONOABS 2.0 (H) 07/26/2019 0418   EOSABS 0.3 07/26/2019 0418   BASOSABS 0.0 07/26/2019 0418   BMP Latest Ref Rng & Units 07/27/2019 07/27/2019 07/26/2019  Glucose 70 - 99 mg/dL 258(H) 210(H) 106(H)  BUN 8 - 23 mg/dL 39(H) 41(H) 41(H)  Creatinine 0.44 - 1.00 mg/dL 1.24(H) 1.20(H) 1.24(H)  Sodium 135 - 145 mmol/L 133(L) 135 137  Potassium 3.5 - 5.1 mmol/L 4.8 5.0  5.5(H)  Chloride 98 - 111 mmol/L 97(L) 101 103  CO2 22 - 32 mmol/L 26 27 26   Calcium 8.9 - 10.3 mg/dL 7.8(L) 7.6(L) 7.5(L)      IMAGING    DG Chest Port 1 View  Result Date: 07/27/2019 CLINICAL DATA:  Coronavirus infection.  Ventilator support. EXAM: PORTABLE CHEST 1 VIEW COMPARISON:  07/24/2019 FINDINGS: Previous median sternotomy and CABG. Endotracheal tube tip 4 cm above the carina. Orogastric or nasogastric tube enters the abdomen. Right internal jugular central line tip in the proximal right atrium. Widespread patchy pulmonary infiltrates persist, similar to the study of 3 days ago. There may be slight improvement in the right mid and lower lung. IMPRESSION: Lines and tubes well position. Persistent widespread pulmonary infiltrates. Possible mild improvement in the right lung. Electronically Signed   By: Nelson Chimes M.D.   On: 07/27/2019 06:57       Indwelling Urinary Catheter continued, requirement due to   Reason to continue Indwelling Urinary Catheter strict Intake/Output monitoring for hemodynamic instability   Central Line/ continued, requirement due to  Reason to continue [place] of central venous pressure or other hemodynamic parameters and poor IV access   Ventilator continued, requirement due to severe respiratory failure   Ventilator Sedation RASS 0 to -2      ASSESSMENT AND PLAN SYNOPSIS  SEVERE ACUTE HYPOXIC AND HYPERCAPNIC RESP FAILURE FROM ACUTE COVID 19 PNEUMONITIS AND SEEVERE COPD EXACERBATION WITH SEVERE SYSTOLIC CHF EXACERBATION WITH PROGRESSIVE CARDIORENAL SYNDROME   Severe ACUTE Hypoxic and Hypercapnic Respiratory Failure -continue Full MV support -continue Bronchodilator Therapy -Wean Fio2 and PEEP as tolerated -will perform SAT/SBT when respiratory parameters are met  ACUTE SYSTOLIC CARDIAC FAILURE- EF 25% NSTEMI-hold heparin for bloody secretions afib with RVR on amiodarone   ACUTE KIDNEY INJURY/Renal Failure -follow chem  7 -follow UO -continue Foley Catheter-assess need -Avoid nephrotoxic agents -Recheck creatinine  On CRRT   NEUROLOGY - intubated and sedated - minimal sedation to achieve a RASS goal: -1   SHOCK-SEPSIS/HYPOVOLUMIC/CARDIOGENIC -use vasopressors to keep MAP>65 -follow ABG and LA -follow up cultures -emperic ABX  CARDIAC ICU monitoring  ID -continue IV abx as prescibed -follow up cultures  GI GI PROPHYLAXIS as indicated  NUTRITIONAL STATUS DIET-->TF's as tolerated Constipation protocol as indicated  ENDO - will use ICU hypoglycemic\Hyperglycemia protocol if indicated   ELECTROLYTES -follow labs as needed -replace as needed -pharmacy consultation and following   DVT/GI PRX ordered TRANSFUSIONS AS NEEDED MONITOR FSBS ASSESS the need for LABS as needed   Critical Care Time devoted to patient care services described in this note is 35 minutes.   Overall, patient is critically ill, prognosis is guarded.  Patient with Multiorgan failure and at high risk for cardiac arrest and death.   VERY POOR CHANCE OF MEANINGFUL RECOVERY RECOMMEND DNR STATUS   Corrin Parker, M.D.  [facility]  Medical Director [place] Director [facility] Cardio-Pulmonary Department

## 2019-07-27 NOTE — Progress Notes (Signed)
CRRT restarted at [id number] with no complications.

## 2019-07-27 NOTE — Progress Notes (Signed)
Andrea [name]  MRN: [id number]  DOB/AGE: 1945/08/06 74 y.o.  Primary Care Physician:Khan, Nyra Jabs, MD  Admit date: 07/23/2019  Chief Complaint:  Chief Complaint  Patient presents with  . Shortness of Breath    S-Pt presented on  07/12/2019 with  Chief Complaint  Patient presents with  . Shortness of Breath   Pt is intubated Patient remains critically ill. Patient is on continuous renal replacement therapy Patient is unable to offer any complaints  Medications  . atorvastatin  40 mg Per Tube q1800  . B-complex with vitamin C  1 tablet Per Tube Daily  . chlorhexidine gluconate (MEDLINE KIT)  15 mL Mouth Rinse BID  . Chlorhexidine Gluconate Cloth  6 each Topical Daily  . famotidine  20 mg Per Tube QHS  . insulin aspart  3-9 Units Subcutaneous Q4H  . insulin detemir  8 Units Subcutaneous BID  . ipratropium-albuterol  3 mL Nebulization BID  . mouth rinse  15 mL Mouth Rinse 10 times per day  . midodrine  10 mg Oral TID WC  . multivitamin  15 mL Per Tube Daily  . polyvinyl alcohol  2 drop Both Eyes TID  . senna-docusate  1 tablet Per Tube BID         ROS: Unable to get any data as patient is intubated   Physical Exam: Vital signs in last 24 hours: Temp:  [98.2 F (36.8 C)-99.2 F (37.3 C)] 99.2 F (37.3 C) (01/31 1200) Pulse Rate:  [88-116] 107 (01/31 1200) Resp:  [12-21] 19 (01/31 1200) BP: (79-131)/(37-82) 131/52 (01/31 1200) SpO2:  [90 %-100 %] 97 % (01/31 1200) FiO2 (%):  [28 %-55 %] 28 % (01/31 0804) Weight:  [70.7 kg] 70.7 kg (01/31 0500) Weight change: 0.7 kg Last BM Date: 07/26/19  Intake/Output from previous day: 01/30 0701 - 01/31 0700 In: 3027.9 [I.V.:1675.4; NG/GT:1090; IV Piggyback:262.4] Out: 3560 [Urine:4] Total I/O In: 830.3 [I.V.:410.3; NG/GT:320; IV Piggyback:100] Out: 1120 [Other:1120]   Physical Exam: General-intubated HEENTHead is atraumatic normocephalic, ET tube in situ  Resp- No acute REsp distress, Rhonchi+ CVS- S1S2  regular in rate and rhythm GIT- BS+, soft, NT, ND EXT-t2+  LE Edema, Cyanosis Access patient has temporary right IJ dialysis catheter  Lab Results: CBC Recent Labs    07/26/19 0418 07/27/19 0609  WBC 34.1* 32.0*  HGB 8.0* 7.2*  HCT 26.1* 24.3*  PLT 242 125*    BMET Recent Labs    07/27/19 0609 07/27/19 1031  NA 133* 135  K 4.8 4.5  CL 97* 99  CO2 26 23  GLUCOSE 258* 254*  BUN 39* 41*  CREATININE 1.24* 1.17*  CALCIUM 7.8* 8.3*   Creatinine trend 2021 1.5--2.0 now on CRRT 2020 1.5--2.0 2019 1.8--2.1 2018 1.3   MICRO Recent Results (from the past 240 hour(s))  Culture, blood (Routine x 2)     Status: None   Collection Time: 07/01/2019  9:40 PM   Specimen: BLOOD  Result Value Ref Range Status   Specimen Description BLOOD RIGHT ANTECUBITAL  Final   Special Requests   Final    BOTTLES DRAWN AEROBIC AND ANAEROBIC Blood Culture adequate volume   Culture   Final    NO GROWTH 5 DAYS Performed at [facility], [address] [place], [place] [address]    Report Status 07/23/2019 FINAL  Final  Culture, blood (Routine x 2)     Status: None   Collection Time: 07/04/2019  9:40 PM   Specimen: BLOOD  Result  Value Ref Range Status   Specimen Description BLOOD LEFT ANTECUBITAL  Final   Special Requests   Final    BOTTLES DRAWN AEROBIC AND ANAEROBIC Blood Culture adequate volume   Culture   Final    NO GROWTH 5 DAYS Performed at [facility], [address] [place], [place] [address]    Report Status 07/23/2019 FINAL  Final  MRSA PCR Screening     Status: None   Collection Time: 07/19/19  6:45 AM   Specimen: Nasal Mucosa; Nasopharyngeal  Result Value Ref Range Status   MRSA by PCR NEGATIVE NEGATIVE Final    Comment:        The GeneXpert MRSA Assay (FDA approved for NASAL specimens only), is one component of a comprehensive MRSA colonization surveillance program. It is not intended to diagnose MRSA infection nor to guide or monitor treatment  for MRSA infections. Performed at [facility] [address] [place], [place] [address]   Respiratory Panel by PCR     Status: None   Collection Time: 07/19/19 10:03 AM   Specimen: Nasopharyngeal Swab; Respiratory  Result Value Ref Range Status   Adenovirus NOT DETECTED NOT DETECTED Final   Coronavirus 229E NOT DETECTED NOT DETECTED Final    Comment: (NOTE) The Coronavirus on the Respiratory Panel, DOES NOT test for the novel  Coronavirus (2019 nCoV)    Coronavirus HKU1 NOT DETECTED NOT DETECTED Final   Coronavirus NL63 NOT DETECTED NOT DETECTED Final   Coronavirus OC43 NOT DETECTED NOT DETECTED Final   Metapneumovirus NOT DETECTED NOT DETECTED Final   Rhinovirus / Enterovirus NOT DETECTED NOT DETECTED Final   Influenza A NOT DETECTED NOT DETECTED Final   Influenza B NOT DETECTED NOT DETECTED Final   Parainfluenza Virus 1 NOT DETECTED NOT DETECTED Final   Parainfluenza Virus 2 NOT DETECTED NOT DETECTED Final   Parainfluenza Virus 3 NOT DETECTED NOT DETECTED Final   Parainfluenza Virus 4 NOT DETECTED NOT DETECTED Final   Respiratory Syncytial Virus NOT DETECTED NOT DETECTED Final   Bordetella pertussis NOT DETECTED NOT DETECTED Final   Chlamydophila pneumoniae NOT DETECTED NOT DETECTED Final   Mycoplasma pneumoniae NOT DETECTED NOT DETECTED Final    Comment: Performed at [facility], [address] [place], [place] [address]      Lab Results  Component Value Date   CALCIUM 8.3 (L) 07/27/2019   PHOS 3.3 07/27/2019               Impression: 1)Renal  AKI secondary to ATN ATN secondary to septic shock AKI on CKD CKD stage III CKD since 2018 CKD most likely secondary to diabetes mellitus   2) shock-hypotension  Patient requiring IV pressors     3)Anemia of chronic disease  HGb stable   4) secondary hyperparathyroidism -CKD Mineral-Bone Disorder  Phosphorus at goal.   5)Acute resp failure Pt currently requiring  intubation    6) electrolytes   sodium Hyponatremic Now better   potassium Hyperkalemic On CRRT   7)Acid base Co2 at goal     Plan:   Will continue CRRT. Will continue to  remove 1 liter in 24 hours as pt is hypervolemic Pt family was at bedside,pt family's had no new queries today.     Zariyah Stephens s Theador Hawthorne 07/27/2019, 2:08 PM

## 2019-07-27 NOTE — Progress Notes (Signed)
CRRT temporarily interrupted d/t increasing filter pressures-> blood successfully returned and access lines heparin locked.

## 2019-07-27 NOTE — Consult Note (Signed)
Pharmacy Antibiotic Note  Andrea [name] is a 74 y.o. female with medical history including MI, CAD s/p CABG, T2DM, CHF, CKD stage III, and COVID 19 (04/2019) admitted on 07/17/2019 with pneumonia. Patient is critically ill in the ICU on the ventilator for acute respiratory failure and with AKI requiring CRRT.  Pharmacy has been consulted for pip/tazo dosing.  WBC trending up to 34.1 today with left shift. Inflammatory markers elevated. Patient is afebrile.   Plan: pip/tazo 3.375 g q6h (CRRT dosing)  Continue to follow antibiotic plan. Adjust dosing as indicated for RRT / renal function.   Height: 5\' 2"  (157.5 cm) Weight: 155 lb 13.8 oz (70.7 kg) IBW/kg (Calculated) : 50.1  Temp (24hrs), Avg:98.4 F (36.9 C), Min:98.2 F (36.8 C), Max:98.9 F (37.2 C)  Recent Labs  Lab 07/23/19 0512 07/23/19 1737 07/24/19 0229 07/24/19 0423 07/25/19 0426 07/25/19 1557 07/26/19 0418 07/26/19 0418 07/26/19 1451 07/26/19 1755 07/26/19 2111 07/27/19 0244 07/27/19 0609  WBC 17.8*  --  24.6*  --  32.5*  --  34.1*  --   --   --   --   --  32.0*  CREATININE 0.99   < >  --    < > 1.13*   < > 1.25*   < > 1.15* 1.10* 1.24* 1.20* 1.24*   < > = values in this interval not displayed.    Estimated Creatinine Clearance: 37.7 mL/min (A) (by C-G formula based on SCr of 1.24 mg/dL (H)).    Allergies  Allergen Reactions  . Metformin And Related Diarrhea  . Sulfur Hives    Antimicrobials this admission: Cefepime x 1 1/22 Vancomycin 1/22 >> 1/23 Levofloxacin 1/23 >> 1/25 Unasyn 1/25 >> 1/26 Erythromycin 1/28 >> 1/29 PO Vanc 1/29 x 1 Pip/tazo 1/26 >> 1/29, 1/30 >>  Dose adjustments this admission: n/a  Microbiology results: 1/22 BCx: NG x 5 days (2 sets) 1/23 Respiratory panel: negative  1/23 MRSA PCR: negative  Thank you for allowing pharmacy to be a part of this patient's care.  Chinita Greenland PharmD Clinical Pharmacist 07/27/2019

## 2019-07-28 ENCOUNTER — Ambulatory Visit: Payer: Medicare Other | Admitting: Family

## 2019-07-28 DIAGNOSIS — N183 Chronic kidney disease, stage 3 unspecified: Secondary | ICD-10-CM

## 2019-07-28 LAB — MAGNESIUM: Magnesium: 2.2 mg/dL (ref 1.7–2.4)

## 2019-07-28 LAB — RENAL FUNCTION PANEL
Albumin: 2.3 g/dL — ABNORMAL LOW (ref 3.5–5.0)
Albumin: 2.2 g/dL — ABNORMAL LOW (ref 3.5–5.0)
Anion gap: 10 (ref 5–15)
Anion gap: 8 (ref 5–15)
BUN: 45 mg/dL — ABNORMAL HIGH (ref 8–23)
BUN: 40 mg/dL — ABNORMAL HIGH (ref 8–23)
CO2: 26 mmol/L (ref 22–32)
CO2: 28 mmol/L (ref 22–32)
Calcium: 8.2 mg/dL — ABNORMAL LOW (ref 8.9–10.3)
Calcium: 8.7 mg/dL — ABNORMAL LOW (ref 8.9–10.3)
Chloride: 100 mmol/L (ref 98–111)
Chloride: 100 mmol/L (ref 98–111)
Creatinine, Ser: 1.2 mg/dL — ABNORMAL HIGH (ref 0.44–1.00)
Creatinine, Ser: 1.03 mg/dL — ABNORMAL HIGH (ref 0.44–1.00)
GFR calc Af Amer: 52 mL/min — ABNORMAL LOW (ref >60)
GFR calc Af Amer: >60 mL/min (ref >60)
GFR calc non Af Amer: 45 mL/min — ABNORMAL LOW (ref >60)
GFR calc non Af Amer: 54 mL/min — ABNORMAL LOW (ref >60)
Glucose, Bld: 306 mg/dL — ABNORMAL HIGH (ref 70–99)
Glucose, Bld: 180 mg/dL — ABNORMAL HIGH (ref 70–99)
Phosphorus: 2.9 mg/dL (ref 2.5–4.6)
Phosphorus: 3 mg/dL (ref 2.5–4.6)
Potassium: 3.6 mmol/L (ref 3.5–5.1)
Potassium: 3.6 mmol/L (ref 3.5–5.1)
Sodium: 136 mmol/L (ref 135–145)
Sodium: 136 mmol/L (ref 135–145)

## 2019-07-28 LAB — CBC
HCT: 23.6 % — ABNORMAL LOW (ref 36.0–46.0)
Hemoglobin: 7.2 g/dL — ABNORMAL LOW (ref 12.0–15.0)
MCH: 29.8 pg (ref 26.0–34.0)
MCHC: 30.5 g/dL (ref 30.0–36.0)
MCV: 97.5 fL (ref 80.0–100.0)
Platelets: 103 10*3/uL — ABNORMAL LOW (ref 150–400)
RBC: 2.42 MIL/uL — ABNORMAL LOW (ref 3.87–5.11)
RDW: 20.3 % — ABNORMAL HIGH (ref 11.5–15.5)
WBC: 36.4 10*3/uL — ABNORMAL HIGH (ref 4.0–10.5)
nRBC: 8.9 % — ABNORMAL HIGH (ref 0.0–0.2)

## 2019-07-28 LAB — GLUCOSE, CAPILLARY
Glucose-Capillary: 134 mg/dL — ABNORMAL HIGH (ref 70–99)
Glucose-Capillary: 144 mg/dL — ABNORMAL HIGH (ref 70–99)
Glucose-Capillary: 169 mg/dL — ABNORMAL HIGH (ref 70–99)
Glucose-Capillary: 207 mg/dL — ABNORMAL HIGH (ref 70–99)
Glucose-Capillary: 221 mg/dL — ABNORMAL HIGH (ref 70–99)
Glucose-Capillary: 254 mg/dL — ABNORMAL HIGH (ref 70–99)
Glucose-Capillary: 89 mg/dL (ref 70–99)

## 2019-07-28 MED ORDER — PRO-STAT SUGAR FREE PO LIQD
30.0000 mL | Freq: Every day | ORAL | Status: DC
  Administered 2019-07-28 – 2019-07-30 (×3): 30 mL

## 2019-07-28 MED ORDER — INSULIN ASPART 100 UNIT/ML ~~LOC~~ SOLN
3.0000 [IU] | SUBCUTANEOUS | Status: DC
  Administered 2019-07-28 – 2019-07-30 (×6): 3 [IU] via SUBCUTANEOUS
  Filled 2019-07-28 (×6): qty 1

## 2019-07-28 MED ORDER — INSULIN ASPART 100 UNIT/ML ~~LOC~~ SOLN
2.0000 [IU] | SUBCUTANEOUS | Status: DC
  Administered 2019-07-28: 16:00:00 2 [IU] via SUBCUTANEOUS
  Administered 2019-07-29: 20:00:00 4 [IU] via SUBCUTANEOUS
  Administered 2019-07-29 (×2): 6 [IU] via SUBCUTANEOUS
  Administered 2019-07-29 – 2019-07-30 (×3): 4 [IU] via SUBCUTANEOUS
  Administered 2019-07-30 – 2019-07-31 (×2): 2 [IU] via SUBCUTANEOUS
  Administered 2019-08-01: 4 [IU] via SUBCUTANEOUS
  Filled 2019-07-28 (×10): qty 1

## 2019-07-28 MED ORDER — DOBUTAMINE IN D5W 4-5 MG/ML-% IV SOLN
3.0000 ug/kg/min | INTRAVENOUS | Status: DC
  Administered 2019-07-28: 5 ug/kg/min via INTRAVENOUS
  Filled 2019-07-28: qty 250

## 2019-07-28 NOTE — Progress Notes (Signed)
Inpatient Diabetes Program Recommendations  AACE/ADA: New Consensus Statement on Inpatient Glycemic Control (2015)  Target Ranges:  Prepandial:   less than 140 mg/dL      Peak postprandial:   less than 180 mg/dL (1-2 hours)      Critically ill patients:  140 - 180 mg/dL   Lab Results  Component Value Date   GLUCAP 221 (H) 07/28/2019   HGBA1C 7.9 (H) 07/19/2019    Review of Glycemic Control Results for [name], [name] (MRN [id number]) as of 07/28/2019 12:13  Ref. Range 07/27/2019 07:29 07/27/2019 11:45 07/27/2019 15:59 07/27/2019 19:57 07/27/2019 23:32 07/28/2019 00:01 07/28/2019 03:42 07/28/2019 08:29 07/28/2019 08:32 07/28/2019 11:43  Glucose-Capillary Latest Ref Range: 70 - 99 mg/dL 133 (H) 197 (H) 152 (H) 149 (H) 52 (L) 144 (H) 169 (H) 254 (H) 207 (H) 221 (H)   Diabetes history: DM 2 Outpatient Diabetes medications: Glucotrol 10 mg bid + Januvia 50 mg qd  Current orders for Inpatient glycemic control:  Levemir 8 units bid ICU Novolog Resistant 3-9 units Q4 hours  Vital HP 50 ml/hour BUN/Creat: 45/1.20  Inpatient Diabetes Program Recommendations:     Hypoglycemia  Consider decreasing ICU Novolog Correction to Moderate 2-6 units Q4 hours.   Add Novolog 3 units Q4 hours tube feed coverage (do not give if tube feeds are stopped or held).  Thanks,  Tama Headings RN, MSN, BC-ADM Inpatient Diabetes Coordinator Team Pager [phone] (8a-5p)

## 2019-07-28 NOTE — Clinical Social Work Note (Signed)
CSW acknowledges SNF/Home Health consult. Patient on ventilator and CRRT. Will continue to follow for TOC needs.  Dayton Scrape, [place]

## 2019-07-28 NOTE — Progress Notes (Signed)
CRITICAL CARE NOTE BRIEF PATIENT DESCRIPTION:  74 yo female dx with COVID-19 04/2019 admitted with acute on chronic renal failure with hyperkalemia, elevated troponin, and acute on chronic hypoxic hypercapnic respiratory failure secondary to bacterial pneumonia vs. COVID-19 pneumonitis requiring mechanical intubation    SYNOPSIS This is a 74 yo female with a PMH of HTN, MI, CABG (07/2018)Type II Diabetes Mellitus, COPD, Chronic Home O2 @2L , Chronic Atrial Fibrillation (on eliquis), Chronic Diastolic CHF (EF 40 to 14% via Echo 05/2019), CKD Stage III (baseline creatinine 1.6-1.9), and COVID-19 (dx 05/15/2019). She presented to [facility] ER via EMS on 01/22 with worsening shortness of breath, cough, and fatigue. At baseline pt wears chronic home O2 @2L , however due to symptoms she has had to increase her O2 to 4L 24hrs prior to current ER presentation. En route to the ER EMS placed pt on 6L O2 and administered albuterol. Upon arrival to the ER pt tachypneic with increased work of breathing requiring iv steroids, duonebs, and HFNC. Lab results revealed Na+ 133, CO2 21, BUN 38, creatinine 1.78, glucose 300, anion gap 17, troponin 90, lactic acid 6.2, pct 1.11, wbc 13.7, hgb 8.4, and vbg pH 7.38/pCO2 42. CXR concerning for multifocal pneumonia and small bilateral pleural effusions. She received solumedrol, vancomycin, and cefepime. She was subsequently admitted to the stepdown unit per hospitalist team for additional workup and treatment, however remained in the ER pending bed availability. On 01/23 pt developed worsening acute hypoxic respiratory failure possibly due to flash pulmonary edema after receiving a total of 3L of fluids requiring mechanical intubation. PCCM contacted to assume care.  Pt does have a hx of COVID-19 diagnosed 05/14/2020, which required hospitalization/treatment at [facility]. Pt discharged home on 05/20/2019. However, she required hospitalization again at [facility] on  05/31/2019 due to multifocal pneumonia, following treatment she was discharged home on 06/29/2019.   CC  follow up respiratory failure  SUBJECTIVE Patient remains critically ill Continues to be dependent on pressors Continues to be dependent on the ventilator Acute multiorgan failure on chronic severe multiorgan dysfunction  BP (!) 134/46   Pulse (!) 120   Temp 98 F (36.7 C) (Axillary)   Resp 11   Ht 5' 2"  (1.575 m)   Wt 69.3 kg   SpO2 99%   BMI 27.94 kg/m    I/O last 3 completed shifts: In: 4559.9 [I.V.:2509.8; Other:20; NG/GT:1680; IV Piggyback:350.1] Out: 7829 [Urine:15; Other:6306] Total I/O In: 423.5 [I.V.:273.5; NG/GT:150] Out: 289 [Other:289]  SpO2: 99 % O2 Flow Rate (L/min): 40 L/min FiO2 (%): 35 %   SIGNIFICANT EVENTS 01/22: Pt admitted to the stepdown unit with acute hypoxic respiratory failure secondary to multifocal pneumonia, however remained in the ER pending bed availability 01/23: Pt developed worsening acute hypoxic respiratory failure requiring emergent mechanical intubation in the ER. PCCM contacted to assume care 1/24- parameters for SBT met, patient s/p weaning trial with successful liberation from MV. Updated daughter [name].  1/25 increased WOB and back on biPAP, re-intubated, started on CRRT, family updated multiple times 1/26 severe multiorgan failure, cardiogenic shock, on CRRT, multiple vasopressors 1/27via multiorgan failure and cardiogenic shock CRRT multiple pressors 1/26fo2 increased to 50%, unable to perform SAT/SBT Remains in cardiogenic shock On CRRT and multiple vasopressors 1/30 on CRRT, multiple vasopressors, multiorgan failure 2/01 continues to be dependent on pressors, CRRT and ventilator.  Updated grandson and daughter (Marlaine Hind   REVIEW OF SYSTEMS  PATIENT IS UNABLE TO PROVIDE COMPLETE REVIEW OF SYSTEMS DUE TO SEVERE CRITICAL ILLNESS   PHYSICAL  EXAMINATION:  GENERAL:critically ill appearing, intubated mechanically  ventilated  HEAD: Normocephalic, atraumatic.  EYES: Pupils equal, round, reactive to light.  No scleral icterus.  MOUTH: Orotracheally intubated, OG in place NECK: Supple.  Trachea midline PULMONARY: Coarse sounds with rhonchi in the upper lung zones CARDIOVASCULAR: S1 and S2.  Sinus rhythm on the monitor. No murmurs, rubs, or gallops.Mottled fingertips and toes.  GASTROINTESTINAL: Soft,non-distended.  Positive bowel sounds.   MUSCULOSKELETAL: No joint swelling, no clubbing, 3+ anasarca NEUROLOGIC: Opens eyes, does not track, unable to do SAT/SBT due to severity of illness SKIN: Mottled, anasarca as noted above  MEDICATIONS: I have reviewed all medications and confirmed regimen as documented   CULTURE RESULTS   Recent Results (from the past 240 hour(s))  MRSA PCR Screening     Status: None   Collection Time: 07/19/19  6:45 AM   Specimen: Nasal Mucosa; Nasopharyngeal  Result Value Ref Range Status   MRSA by PCR NEGATIVE NEGATIVE Final    Comment:        The GeneXpert MRSA Assay (FDA approved for NASAL specimens only), is one component of a comprehensive MRSA colonization surveillance program. It is not intended to diagnose MRSA infection nor to guide or monitor treatment for MRSA infections. Performed at [facility], [address] [place], [place] [address]   Respiratory Panel by PCR     Status: None   Collection Time: 07/19/19 10:03 AM   Specimen: Nasopharyngeal Swab; Respiratory  Result Value Ref Range Status   Adenovirus NOT DETECTED NOT DETECTED Final   Coronavirus 229E NOT DETECTED NOT DETECTED Final    Comment: (NOTE) The Coronavirus on the Respiratory Panel, DOES NOT test for the novel  Coronavirus (2019 nCoV)    Coronavirus HKU1 NOT DETECTED NOT DETECTED Final   Coronavirus NL63 NOT DETECTED NOT DETECTED Final   Coronavirus OC43 NOT DETECTED NOT DETECTED Final   Metapneumovirus NOT DETECTED NOT DETECTED Final   Rhinovirus / Enterovirus NOT DETECTED  NOT DETECTED Final   Influenza A NOT DETECTED NOT DETECTED Final   Influenza B NOT DETECTED NOT DETECTED Final   Parainfluenza Virus 1 NOT DETECTED NOT DETECTED Final   Parainfluenza Virus 2 NOT DETECTED NOT DETECTED Final   Parainfluenza Virus 3 NOT DETECTED NOT DETECTED Final   Parainfluenza Virus 4 NOT DETECTED NOT DETECTED Final   Respiratory Syncytial Virus NOT DETECTED NOT DETECTED Final   Bordetella pertussis NOT DETECTED NOT DETECTED Final   Chlamydophila pneumoniae NOT DETECTED NOT DETECTED Final   Mycoplasma pneumoniae NOT DETECTED NOT DETECTED Final    Comment: Performed at [facility], [address], [place], [place] [address]        CBC    Component Value Date/Time   WBC 36.4 (H) 07/28/2019 0426   RBC 2.42 (L) 07/28/2019 0426   HGB 7.2 (L) 07/28/2019 0426   HCT 23.6 (L) 07/28/2019 0426   PLT 103 (L) 07/28/2019 0426   MCV 97.5 07/28/2019 0426   MCH 29.8 07/28/2019 0426   MCHC 30.5 07/28/2019 0426   RDW 20.3 (H) 07/28/2019 0426   LYMPHSABS 2.0 07/26/2019 0418   MONOABS 2.0 (H) 07/26/2019 0418   EOSABS 0.3 07/26/2019 0418   BASOSABS 0.0 07/26/2019 0418   BMP Latest Ref Rng & Units 07/28/2019 07/28/2019 07/27/2019  Glucose 70 - 99 mg/dL 180(H) 306(H) 215(H)  BUN 8 - 23 mg/dL 40(H) 45(H) 44(H)  Creatinine 0.44 - 1.00 mg/dL 1.03(H) 1.20(H) 1.17(H)  Sodium 135 - 145 mmol/L 136 136 135  Potassium 3.5 - 5.1 mmol/L 3.6 3.6 4.4  Chloride 98 - 111 mmol/L 100 100 99  CO2 22 - 32 mmol/L 28 26 30   Calcium 8.9 - 10.3 mg/dL 8.7(L) 8.2(L) 8.3(L)      IMAGING    No results found.     Indwelling Urinary Catheter continued, requirement due to   Reason to continue Indwelling Urinary Catheter strict Intake/Output monitoring for hemodynamic instability   Central Line/ continued, requirement due to  Reason to continue Ragan of central venous pressure or other hemodynamic parameters and poor IV access   Ventilator continued, requirement due to severe  respiratory failure   Ventilator Sedation RASS 0 to -2      ASSESSMENT AND PLAN SYNOPSIS  SEVERE ACUTE HYPOXIC AND HYPERCAPNIC RESP FAILURE FROM ACUTE COVID 19 PNEUMONITIS AND SEEVERE COPD EXACERBATION WITH SEVERE SYSTOLIC CHF EXACERBATION WITH PROGRESSIVE CARDIORENAL SYNDROME   Severe ACUTE Hypoxic and Hypercapnic Respiratory Failure Continue ventilator support, unable to wean due to severity of multiorgan failure SBT/SAT unable to be performed due to the severity of her critical illness Prognosis is dismal, have recommended transitioning to comfort care to the family  ACUTE SYSTOLIC CARDIAC FAILURE- EF 25% Persistent cardiogenic shock NSTEMI-cannot be anticoagulated: Bloody ET tube secretions, thrombocytopenia Afib with RVR, maintained NSR on amiodarone  ACUTE KIDNEY INJURY/Renal Failure On CRRT Continues with issues with volume overload Tolerating poorly requiring pressors  NEUROLOGY - intubated and sedated - minimal sedation to achieve a RASS goal: -1  SHOCK-SEPSIS/CARDIOGENIC -use vasopressors to keep MAP>65 -follow ABG and LA On piperacillin tazobactam  CARDIAC ICU monitoring  ID Continue piperacillin tazobactam Leukocytosis due to leukemoid reaction Steroids discontinued  GI GI PROPHYLAXIS as indicated  NUTRITIONAL STATUS DIET-->TF's as tolerated Constipation protocol as indicated  ENDO - will use ICU hypoglycemic\Hyperglycemia protocol if indicated   ELECTROLYTES -follow labs as needed -replace as needed -pharmacy consultation and following   DVT/GI PRX ordered TRANSFUSIONS AS NEEDED MONITOR FSBS ASSESS the need for LABS as needed   Critical Care Time devoted to patient care services described in this note is 45 minutes.   Overall, patient is critically ill, prognosis is guarded.  Patient with acute multiorgan failure in the setting of severe pre-existing multiorgan dysfunction and at high risk for cardiac arrest and death.   VERY POOR  CHANCE OF MEANINGFUL RECOVERY RECOMMEND DNR STATUS and transitioning to comfort care.  Discussed at length with patient's grandson at bedside and with patient's daughter Marlaine Hind) over the phone.  They are gathering family and are to make a decision with regards to the patient's care moving on forward.  They are aware of her exceedingly poor prognosis.   Renold Don, MD [place] [facility]

## 2019-07-28 NOTE — Progress Notes (Signed)
   07/28/19 1500  Clinical Encounter Type  Visited With Patient and family together  Visit Type Follow-up  Referral From Chaplain  Consult/Referral To Chaplain  Chaplain went back to check on patient and grandson. Chaplain check to see if Andrea [name] had eaten lunch and he said yes. Andrea [name] also informed Chaplain that he told his Ander Purpura that Chaplain asked about her. Chaplain offered pastoral presence and empathy. Chaplain will check in on patient and [facility] tomorrow.

## 2019-07-28 NOTE — Consult Note (Addendum)
Pharmacy Antibiotic Note  Andrea [name] is a 74 y.o. female with medical history including MI, CAD s/p CABG, T2DM, CHF, CKD stage III, and COVID 19 (04/2019) admitted on 07/15/2019 with pneumonia. Patient is critically ill in the ICU on the ventilator for acute respiratory failure and with AKI requiring CRRT. Pharmacy has been consulted for pip/tazo dosing.  WBC trending up to 36.4 today. Inflammatory markers elevated. Patient is afebrile.   Plan: pip/tazo 3.375 g q6h (CRRT dosing)  Continue to follow antibiotic plan. Adjust dosing as indicated for RRT / renal function. Will discuss tomorrow in am rounds to continue or discontinue antibiotic therapy.   Antibiotic Day So Far for Pneumonia Therapy: 8 days  Height: 5\' 2"  (157.5 cm) Weight: 152 lb 12.5 oz (69.3 kg) IBW/kg (Calculated) : 50.1  Temp (24hrs), Avg:98.2 F (36.8 C), Min:97.6 F (36.4 C), Max:99 F (37.2 C)  Recent Labs  Lab 07/24/19 0229 07/24/19 0423 07/25/19 0426 07/25/19 1557 07/26/19 0418 07/26/19 1451 07/27/19 0244 07/27/19 0609 07/27/19 1031 07/27/19 1603 07/28/19 0426  WBC 24.6*  --  32.5*  --  34.1*  --   --  32.0*  --   --  36.4*  CREATININE  --    < > 1.13*   < > 1.25*   < > 1.20* 1.24* 1.17* 1.17* 1.20*   < > = values in this interval not displayed.    Estimated Creatinine Clearance: 38.7 mL/min (A) (by C-G formula based on SCr of 1.2 mg/dL (H)).    Allergies  Allergen Reactions  . Metformin And Related Diarrhea  . Sulfur Hives    Antimicrobials this admission: Cefepime x 1 1/22 Vancomycin 1/22 >> 1/23 Levofloxacin 1/23 >> 1/25 Unasyn 1/25 >> 1/26 Erythromycin 1/28 >> 1/29 PO Vanc 1/29 x 1 Pip/tazo 1/26 >> 1/29, 1/30 >>  Dose adjustments this admission: n/a  Microbiology results: 1/22 BCx: NG x 5 days (2 sets) 1/23 Respiratory panel: negative  1/23 MRSA PCR: negative  Thank you for allowing pharmacy to be a part of this patient's care.  Roanna Banning PharmD Candidate 07/28/2019

## 2019-07-28 NOTE — Progress Notes (Signed)
Nutrition Follow-up  DOCUMENTATION CODES:   Not applicable  INTERVENTION:  Continue Vital High Protein at 50 mL/hr (1200 mL goal daily volume). Also provide Pro-Stat 30 mL once daily per tube. Provides 1300 kcal, 120 grams of protein, 1008 mL H2O daily.  Provide minimum free water flush of 20-30 mL Q4hrs to maintain tube patency.  Continue liquid MVI daily per tube and B-complex with C daily per tube.  NUTRITION DIAGNOSIS:   Inadequate oral intake related to inability to eat(pt sedated and ventilated) as evidenced by NPO status.  Ongoing - addressing with TF regimen.  GOAL:   Provide needs based on ASPEN/SCCM guidelines  Met with TF regimen.  MONITOR:   Vent status, Labs, Weight trends, Skin, I & O's  REASON FOR ASSESSMENT:   Consult Enteral/tube feeding initiation and management  ASSESSMENT:   74 y.o. female with a PMHx of hypertension, myocardial infarction, CABG and MRA of 2020, diabetes mellitus type 2, COPD on chronic home oxygen, chronic diastolic heart failure, chronic kidney disease stage III baseline creatinine 1.6-1.9, COVID-19 infection 05/15/2019, who was admitted to [name] on 07/01/2019 for evaluation of increasing shortness of breath, cough, fatigue and found to have PNA  Patient is currently intubated on ventilator support MV: 8.8 L/min Temp (24hrs), Avg:98.2 F (36.8 C), Min:97.6 F (36.4 C), Max:99 F (37.2 C)  Propofol: N/A  Medications reviewed and include: B-complex with vitamin C daily per tube, famotidine, Novolog 3-9 units Q4hrs, Levemir 8 units BID, liquid MVI daily, senna-docusate 1 tablet BID, amiodarone gtt, Precedex gtt, dobutamine gtt at 5 mcg/kg/min, fentanyl gtt, Levophed gtt at 1 mcg/min, Zosyn, vasopressin gtt now off.  Labs reviewed: CBG 169-254, BUN 45, Creatinine 1.2.  Enteral Access: 16 Fr. NGT  TF: Vital High Protein at 50 mL/hr  Discussed with RN and on rounds. Patient is tolerating tube feed regimen. Patient on CRRT.  Diet  Order:   Diet Order    None     EDUCATION NEEDS:   No education needs have been identified at this time  Skin:  Skin Assessment: Skin Integrity Issues:(stg I sacrum)  Last BM:  07/27/2019 - medium type 6  Height:   Ht Readings from Last 1 Encounters:  07/15/2019 _0  (1.575 m)   Weight:   Wt Readings from Last 1 Encounters:  07/28/19 69.3 kg   Ideal Body Weight:  50 kg  BMI:  Body mass index is 27.94 kg/m.  Estimated Nutritional Needs:   Kcal:  1610 (PSU 2003b w/ MSJ 1161, Ve 8.8, Tmax 37.2)  Protein:  105-125 grams  Fluid:  1.3L/day  Andrea Barnacle, MS, RD, LDN Office: [phone] Pager: [phone] After Hours/Weekend Pager: [phone]

## 2019-07-28 NOTE — Progress Notes (Signed)
Notified Dr. Ronalee Belts that pt is on vasopressin and levophed that I have been titrating up thoughout the shift- now up to 21mcqs - she ordered to stop the dobutamine drip.

## 2019-07-28 NOTE — Progress Notes (Signed)
Performed sedation vacation- pt tolerated for approximately 15 min.  Patient became tachypneic in 30's and patient had agonal breathing.  Patient placed back on her previous rate and was re sedated.  Dr. Patsey Berthold made aware.

## 2019-07-28 NOTE — Progress Notes (Signed)
[facility]  ROUNDING NOTE   Subjective:   Remains on CRRT  UF of 4158m - net negative 1 liter.   Objective:  Vital signs in last 24 hours:  Temp:  [97.6 F (36.4 C)-99.2 F (37.3 C)] 98.9 F (37.2 C) (02/01 0822) Pulse Rate:  [97-111] 107 (02/01 0822) Resp:  [14-21] 21 (02/01 0822) BP: (99-139)/(39-59) 132/44 (02/01 0822) SpO2:  [92 %-100 %] 93 % (02/01 0822) FiO2 (%):  [28 %-35 %] 28 % (02/01 0805) Weight:  [69.3 kg] 69.3 kg (02/01 0425)  Weight change: -1.4 kg Filed Weights   07/26/19 0322 07/27/19 0500 07/28/19 0425  Weight: 70 kg 70.7 kg 69.3 kg    Intake/Output: I/O last 3 completed shifts: In: 4804.9 [I.V.:2562.5; Other:20; NG/GT:1910; IV Piggyback:312.5] Out: 61975[[OITGP:4982]  Intake/Output this shift:  Total I/O In: -  Out: 331 [Other:331]  Physical Exam: General: Critically ill   Head: ETT, OGT  Eyes: Anicteric, PERRL  Neck: Supple, trachea midline  Lungs:  PRVC FiO2 35%  Heart: tachycardia  Abdomen:  Soft, nontender,   Extremities:  trace peripheral edema.  Neurologic: Intubated and sedated  Skin: No lesions  Access: Right IJ temp HD 1/25 Dr. KMortimer Fries   Basic Metabolic Panel: Recent Labs  Lab 07/24/19 0229 07/24/19 0423 07/25/19 0641501/29/21 1557 07/26/19 0830901/30/21 0407601/31/21 0244 07/27/19 0244 07/27/19 0808801/31/21 0110301/31/21 1031 07/27/19 1603 07/28/19 0426  NA  --    < > 136   < > 134*   < > 135  --  133*  --  135 135 136  K  --    < > 4.9   < > 5.4*   < > 5.0  --  4.8  --  4.5 4.4 3.6  CL  --    < > 101   < > 101   < > 101  --  97*  --  99 99 100  CO2  --    < > 27   < > 26   < > 27  --  26  --  23 30 26   GLUCOSE  --    < > 181*   < > 205*   < > 210*  --  258*  --  254* 215* 306*  BUN  --    < > 31*   < > 33*   < > 41*  --  39*  --  41* 44* 45*  CREATININE  --    < > 1.13*   < > 1.25*   < > 1.20*  --  1.24*  --  1.17* 1.17* 1.20*  CALCIUM  --    < > 7.5*   < > 7.6*   < > 7.6*   < > 7.8*   < > 8.3* 8.3*  8.2*  MG 2.6*  --  2.5*  --  2.5*  --   --   --  2.2  --   --   --  2.2  PHOS  --    < > 3.3   < > 3.3   < > 3.7  --  3.6  --  3.3 3.1 2.9   < > = values in this interval not displayed.    Liver Function Tests: Recent Labs  Lab 07/27/19 0244 07/27/19 0609 07/27/19 1031 07/27/19 1603 07/28/19 0426  ALBUMIN 2.3* 2.2* 2.3* 2.3* 2.3*   No results for input(s): LIPASE, AMYLASE in the last 168 hours.  No results for input(s): AMMONIA in the last 168 hours.  CBC: Recent Labs  Lab 07/24/19 0229 07/25/19 0426 07/26/19 0418 07/27/19 0609 07/28/19 0426  WBC 24.6* 32.5* 34.1* 32.0* 36.4*  NEUTROABS  --   --  28.3*  --   --   HGB 8.1* 7.9* 8.0* 7.2* 7.2*  HCT 26.1* 26.3* 26.1* 24.3* 23.6*  MCV 92.6 96.0 97.0 99.6 97.5  PLT 454* 369 242 125* 103*    Cardiac Enzymes: No results for input(s): CKTOTAL, CKMB, CKMBINDEX, TROPONINI in the last 168 hours.  BNP: Invalid input(s): POCBNP  CBG: Recent Labs  Lab 07/27/19 2332 07/28/19 0001 07/28/19 0342 07/28/19 0829 07/28/19 0832  GLUCAP 52* 144* 169* 254* 207*    Microbiology: Results for orders placed or performed during the hospital encounter of 06/30/2019  Culture, blood (Routine x 2)     Status: None   Collection Time: 07/16/2019  9:40 PM   Specimen: BLOOD  Result Value Ref Range Status   Specimen Description BLOOD RIGHT ANTECUBITAL  Final   Special Requests   Final    BOTTLES DRAWN AEROBIC AND ANAEROBIC Blood Culture adequate volume   Culture   Final    NO GROWTH 5 DAYS Performed at [facility], [address] [place], [place] [address]    Report Status 07/23/2019 FINAL  Final  Culture, blood (Routine x 2)     Status: None   Collection Time: 07/02/2019  9:40 PM   Specimen: BLOOD  Result Value Ref Range Status   Specimen Description BLOOD LEFT ANTECUBITAL  Final   Special Requests   Final    BOTTLES DRAWN AEROBIC AND ANAEROBIC Blood Culture adequate volume   Culture   Final    NO GROWTH 5 DAYS Performed at  [facility], [address] [place], [place] [address]    Report Status 07/23/2019 FINAL  Final  MRSA PCR Screening     Status: None   Collection Time: 07/19/19  6:45 AM   Specimen: Nasal Mucosa; Nasopharyngeal  Result Value Ref Range Status   MRSA by PCR NEGATIVE NEGATIVE Final    Comment:        The GeneXpert MRSA Assay (FDA approved for NASAL specimens only), is one component of a comprehensive MRSA colonization surveillance program. It is not intended to diagnose MRSA infection nor to guide or monitor treatment for MRSA infections. Performed at [facility], [address] [place], [place] [address]   Respiratory Panel by PCR     Status: None   Collection Time: 07/19/19 10:03 AM   Specimen: Nasopharyngeal Swab; Respiratory  Result Value Ref Range Status   Adenovirus NOT DETECTED NOT DETECTED Final   Coronavirus 229E NOT DETECTED NOT DETECTED Final    Comment: (NOTE) The Coronavirus on the Respiratory Panel, DOES NOT test for the novel  Coronavirus (2019 nCoV)    Coronavirus HKU1 NOT DETECTED NOT DETECTED Final   Coronavirus NL63 NOT DETECTED NOT DETECTED Final   Coronavirus OC43 NOT DETECTED NOT DETECTED Final   Metapneumovirus NOT DETECTED NOT DETECTED Final   Rhinovirus / Enterovirus NOT DETECTED NOT DETECTED Final   Influenza A NOT DETECTED NOT DETECTED Final   Influenza B NOT DETECTED NOT DETECTED Final   Parainfluenza Virus 1 NOT DETECTED NOT DETECTED Final   Parainfluenza Virus 2 NOT DETECTED NOT DETECTED Final   Parainfluenza Virus 3 NOT DETECTED NOT DETECTED Final   Parainfluenza Virus 4 NOT DETECTED NOT DETECTED Final   Respiratory Syncytial Virus NOT  DETECTED NOT DETECTED Final   Bordetella pertussis NOT DETECTED NOT DETECTED Final   Chlamydophila pneumoniae NOT DETECTED NOT DETECTED Final   Mycoplasma pneumoniae NOT DETECTED NOT DETECTED Final    Comment: Performed at [facility], [place] [address] [place], [place] [address]     Coagulation Studies: No results for input(s): LABPROT, INR in the last 72 hours.  Urinalysis: No results for input(s): COLORURINE, LABSPEC, PHURINE, GLUCOSEU, HGBUR, BILIRUBINUR, KETONESUR, PROTEINUR, UROBILINOGEN, NITRITE, LEUKOCYTESUR in the last 72 hours.  Invalid input(s): APPERANCEUR    Imaging: DG Chest Port 1 View  Result Date: 07/27/2019 CLINICAL DATA:  Coronavirus infection.  Ventilator support. EXAM: PORTABLE CHEST 1 VIEW COMPARISON:  07/24/2019 FINDINGS: Previous median sternotomy and CABG. Endotracheal tube tip 4 cm above the carina. Orogastric or nasogastric tube enters the abdomen. Right internal jugular central line tip in the proximal right atrium. Widespread patchy pulmonary infiltrates persist, similar to the study of 3 days ago. There may be slight improvement in the right mid and lower lung. IMPRESSION: Lines and tubes well position. Persistent widespread pulmonary infiltrates. Possible mild improvement in the right lung. Electronically Signed   By: Nelson Chimes M.D.   On: 07/27/2019 06:57     Medications:   .  prismasol BGK 4/2.5 300 mL/hr at 07/26/19 0334  .  prismasol BGK 4/2.5 200 mL/hr at 07/26/19 0150  . sodium chloride 10 mL/hr at 07/28/19 0400  . amiodarone 30 mg/hr (07/28/19 0700)  . dexmedetomidine (PRECEDEX) IV infusion 1.1 mcg/kg/hr (07/28/19 0700)  . feeding supplement (VITAL HIGH PROTEIN) 50 mL/hr at 07/28/19 0700  . fentaNYL infusion INTRAVENOUS 175 mcg/hr (07/28/19 0700)  . norepinephrine (LEVOPHED) Adult infusion 3 mcg/min (07/28/19 0700)  . piperacillin-tazobactam 3.375 g (07/28/19 0601)  . prismasol BGK 2/2.5 dialysis solution    . prismasol BGK 2/2.5 replacement solution 300 mL/hr at 07/28/19 0846  . prismasol BGK 2/2.5 replacement solution    . prismasol BGK 4/2.5 1,000 mL/hr at 07/26/19 7124  . vasopressin (PITRESSIN) infusion - *FOR SHOCK* 0.03 Units/min (07/28/19 0836)   . atorvastatin  40 mg Per Tube q1800  . B-complex with vitamin  C  1 tablet Per Tube Daily  . chlorhexidine gluconate (MEDLINE KIT)  15 mL Mouth Rinse BID  . Chlorhexidine Gluconate Cloth  6 each Topical Daily  . famotidine  20 mg Per Tube QHS  . insulin aspart  3-9 Units Subcutaneous Q4H  . insulin detemir  8 Units Subcutaneous BID  . ipratropium-albuterol  3 mL Nebulization BID  . mouth rinse  15 mL Mouth Rinse 10 times per day  . midodrine  10 mg Oral TID WC  . multivitamin  15 mL Per Tube Daily  . polyvinyl alcohol  2 drop Both Eyes TID  . senna-docusate  1 tablet Per Tube BID   albuterol, fentaNYL, heparin, midazolam, morphine injection, phenol, sodium chloride flush  Assessment/ Plan:  Ms. LAMIKA CONNOLLY is a 74 y.o. 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 Acute renal failure requiring renal replacement therapy on chronic kidney disease stage IIIB with proteinuria: baseline creatinine 1.5, GFR of 34.   Chronic Kidney Disease secondary to diabetes and hypertension Acute renal failure secondary to ATN from sepsis and pneumonia Anuric urine output -  Continue CRRT: CVVHDF  2. Anemia with renal failure: with thrombocytopenia. Hemoglobin 7.2. No transfusion on this admission.  3. Acute respiratory failure requiring mechanical ventilation: with COPD with chronic oxygen requirements. Acute exacerbation of COPD.   4. Sepsis with shock: secondary to pneumonia. COVID-19 negative.  Requiring multiple vasopressors.  - empiric pip/tazo.   Overall prognosis remains critical. Will reach out to family later today. Appreciate palliative care.    LOS: 10 Johonna Binette 2/1/20219:26 AM

## 2019-07-28 NOTE — Progress Notes (Signed)
   07/28/19 1400  Clinical Encounter Type  Visited With Patient and family together  Visit Type Follow-up  Referral From Chaplain  Consult/Referral To [place] had a short visit with patient and grandson, Keenan Bachelor. Keenan Bachelor had a lot of questions about his grandmother's condition, I told him that he would have to ask the nurse. He thought I was a Marine scientist, therefor I reiterated that I was a Clinical biochemist. He said oh. Keenan Bachelor was texting answered that he received from the nurse to his family. Chaplain offered pastoral presence and empathy.

## 2019-07-28 DEATH — deceased

## 2019-07-29 DIAGNOSIS — I509 Heart failure, unspecified: Secondary | ICD-10-CM

## 2019-07-29 LAB — PREPARE RBC (CROSSMATCH)

## 2019-07-29 LAB — RENAL FUNCTION PANEL
Albumin: 2.1 g/dL — ABNORMAL LOW (ref 3.5–5.0)
Albumin: 2.3 g/dL — ABNORMAL LOW (ref 3.5–5.0)
Anion gap: 11 (ref 5–15)
Anion gap: 10 (ref 5–15)
BUN: 33 mg/dL — ABNORMAL HIGH (ref 8–23)
BUN: 32 mg/dL — ABNORMAL HIGH (ref 8–23)
CO2: 25 mmol/L (ref 22–32)
CO2: 27 mmol/L (ref 22–32)
Calcium: 8.7 mg/dL — ABNORMAL LOW (ref 8.9–10.3)
Calcium: 9.1 mg/dL (ref 8.9–10.3)
Chloride: 99 mmol/L (ref 98–111)
Chloride: 101 mmol/L (ref 98–111)
Creatinine, Ser: 0.93 mg/dL (ref 0.44–1.00)
Creatinine, Ser: 0.83 mg/dL (ref 0.44–1.00)
GFR calc Af Amer: >60 mL/min (ref >60)
GFR calc Af Amer: >60 mL/min (ref >60)
GFR calc non Af Amer: >60 mL/min (ref >60)
GFR calc non Af Amer: >60 mL/min (ref >60)
Glucose, Bld: 394 mg/dL — ABNORMAL HIGH (ref 70–99)
Glucose, Bld: 143 mg/dL — ABNORMAL HIGH (ref 70–99)
Phosphorus: 3.1 mg/dL (ref 2.5–4.6)
Phosphorus: 2.5 mg/dL (ref 2.5–4.6)
Potassium: 3.7 mmol/L (ref 3.5–5.1)
Potassium: 2.9 mmol/L — ABNORMAL LOW (ref 3.5–5.1)
Sodium: 135 mmol/L (ref 135–145)
Sodium: 138 mmol/L (ref 135–145)

## 2019-07-29 LAB — CBC
HCT: 22.1 % — ABNORMAL LOW (ref 36.0–46.0)
Hemoglobin: 6.7 g/dL — ABNORMAL LOW (ref 12.0–15.0)
MCH: 31 pg (ref 26.0–34.0)
MCHC: 30.3 g/dL (ref 30.0–36.0)
MCV: 102.3 fL — ABNORMAL HIGH (ref 80.0–100.0)
Platelets: 91 10*3/uL — ABNORMAL LOW (ref 150–400)
RBC: 2.16 MIL/uL — ABNORMAL LOW (ref 3.87–5.11)
RDW: 22.1 % — ABNORMAL HIGH (ref 11.5–15.5)
WBC: 45.1 10*3/uL — ABNORMAL HIGH (ref 4.0–10.5)
nRBC: 11.8 % — ABNORMAL HIGH (ref 0.0–0.2)

## 2019-07-29 LAB — GLUCOSE, CAPILLARY
Glucose-Capillary: 104 mg/dL — ABNORMAL HIGH (ref 70–99)
Glucose-Capillary: 134 mg/dL — ABNORMAL HIGH (ref 70–99)
Glucose-Capillary: 160 mg/dL — ABNORMAL HIGH (ref 70–99)
Glucose-Capillary: 182 mg/dL — ABNORMAL HIGH (ref 70–99)
Glucose-Capillary: 313 mg/dL — ABNORMAL HIGH (ref 70–99)
Glucose-Capillary: 340 mg/dL — ABNORMAL HIGH (ref 70–99)
Glucose-Capillary: 38 mg/dL — CL (ref 70–99)
Glucose-Capillary: 40 mg/dL — CL (ref 70–99)
Glucose-Capillary: 408 mg/dL — ABNORMAL HIGH (ref 70–99)
Glucose-Capillary: 88 mg/dL (ref 70–99)
Glucose-Capillary: 98 mg/dL (ref 70–99)
Glucose-Capillary: <10 mg/dL — CL (ref 70–99)

## 2019-07-29 LAB — MAGNESIUM: Magnesium: 2.1 mg/dL (ref 1.7–2.4)

## 2019-07-29 LAB — GLUCOSE, RANDOM: Glucose, Bld: 460 mg/dL — ABNORMAL HIGH (ref 70–99)

## 2019-07-29 MED ORDER — PRISMASOL BGK 4/2.5 32-4-2.5 MEQ/L REPLACEMENT SOLN
Status: DC
  Filled 2019-07-29 (×5): qty 5000

## 2019-07-29 MED ORDER — DEXTROSE 50 % IV SOLN
INTRAVENOUS | Status: AC
  Administered 2019-07-29: 04:00:00 25 mL
  Filled 2019-07-29: qty 50

## 2019-07-29 MED ORDER — DEXTROSE 50 % IV SOLN
12.5000 g | Freq: Once | INTRAVENOUS | Status: AC
  Administered 2019-07-29: 04:00:00 12.5 g via INTRAVENOUS

## 2019-07-29 MED ORDER — DEXTROSE 50 % IV SOLN
1.0000 | Freq: Once | INTRAVENOUS | Status: AC
  Administered 2019-07-29: 08:00:00 50 mL via INTRAVENOUS
  Filled 2019-07-29: qty 50

## 2019-07-29 MED ORDER — SODIUM CHLORIDE 0.9% IV SOLUTION: Freq: Once | INTRAVENOUS | Status: AC

## 2019-07-29 MED ORDER — PRISMASOL BGK 4/2.5 32-4-2.5 MEQ/L IV SOLN
INTRAVENOUS | Status: DC
  Filled 2019-07-29 (×17): qty 5000

## 2019-07-29 MED ORDER — AMIODARONE HCL 200 MG PO TABS
200.0000 mg | ORAL_TABLET | Freq: Two times a day (BID) | ORAL | Status: DC
  Administered 2019-07-29 – 2019-08-01 (×7): 200 mg
  Filled 2019-07-29 (×7): qty 1

## 2019-07-29 MED ORDER — PRISMASOL BGK 4/2.5 32-4-2.5 MEQ/L REPLACEMENT SOLN
Status: DC
  Filled 2019-07-29 (×4): qty 5000

## 2019-07-29 NOTE — Consult Note (Signed)
Pharmacy Antibiotic Note  Andrea [name] is a 74 y.o. female with medical history including MI, CAD s/p CABG, T2DM, CHF, CKD stage III, and COVID 19 (04/2019) admitted on 07/03/2019 with pneumonia. Patient is critically ill in the ICU on the ventilator for acute respiratory failure and with AKI requiring CRRT. Pharmacy has been consulted for pip/tazo dosing.  WBC trending up to 45.1 today. Procalcitonin as of 01/30 is 0.35. Patient is afebrile.   Plan: pip/tazo 3.375 g q6h (CRRT dosing)  Continue to follow antibiotic plan. Adjust dosing as indicated for RRT / renal function. Will discuss tomorrow in am rounds to continue or discontinue antibiotic therapy.   Antibiotic Day So Far for Pneumonia Therapy: 9 days Total antibiotic therapy: 12 days  Height: 5\' 2"  (157.5 cm) Weight: 150 lb 5.7 oz (68.2 kg) IBW/kg (Calculated) : 50.1  Temp (24hrs), Avg:98.4 F (36.9 C), Min:97.5 F (36.4 C), Max:99.4 F (37.4 C)  Recent Labs  Lab 07/25/19 0426 07/25/19 1557 07/26/19 0418 07/26/19 1451 07/27/19 0609 07/27/19 0609 07/27/19 1031 07/27/19 1603 07/28/19 0426 07/28/19 1624 07/29/19 0443 07/29/19 0444  WBC 32.5*  --  34.1*  --  32.0*  --   --   --  36.4*  --   --  45.1*  CREATININE 1.13*   < > 1.25*   < > 1.24*   < > 1.17* 1.17* 1.20* 1.03* 0.93  --    < > = values in this interval not displayed.    Estimated Creatinine Clearance: 49.5 mL/min (by C-G formula based on SCr of 0.93 mg/dL).    Allergies  Allergen Reactions  . Metformin And Related Diarrhea  . Sulfur Hives    Antimicrobials this admission: Cefepime x 1 1/22 Vancomycin 1/22 >> 1/23 Levofloxacin 1/23 >> 1/25 Unasyn 1/25 >> 1/26 Erythromycin 1/28 >> 1/29 PO Vanc 1/29 x 1 Pip/tazo 1/26 >> 1/29, 1/30 >>  Dose adjustments this admission: n/a  Microbiology results: 1/22 BCx: NG x 5 days (2 sets) 1/23 Respiratory panel: negative  1/23 MRSA PCR: negative  Thank you for allowing pharmacy to be a part of this  patient's care.  Roanna Banning PharmD Candidate 07/29/2019

## 2019-07-29 NOTE — Progress Notes (Signed)
[facility]  ROUNDING NOTE   Subjective:   Remains on CRRT  UF of 4271m - net negative 1 liter.   Dobutamine overnight however this caused more hypotension.  Hemoglobin 6.7  Objective:  Vital signs in last 24 hours:  Temp:  [97.5 F (36.4 C)-99.4 F (37.4 C)] 98.7 F (37.1 C) (02/02 0721) Pulse Rate:  [88-120] 105 (02/02 0800) Resp:  [10-25] 22 (02/02 0800) BP: (73-142)/(32-59) 132/44 (02/02 0800) SpO2:  [91 %-100 %] 97 % (02/02 0800) FiO2 (%):  [30 %-35 %] 35 % (02/02 0800) Weight:  [68.2 kg] 68.2 kg (02/02 0442)  Weight change: -1.1 kg Filed Weights   07/27/19 0500 07/28/19 0425 07/29/19 0442  Weight: 70.7 kg 69.3 kg 68.2 kg    Intake/Output: I/O last 3 completed shifts: In: 4603.1 [I.V.:2774.4; NG/GT:1660; IV Piggyback:168.7] Out: 63785[Urine:15; OYIFOY:7741]  Intake/Output this shift:  Total I/O In: 124.2 [I.V.:74.2; NG/GT:50] Out: 125 [Other:125]  Physical Exam: General: Critically ill   Head: ETT, OGT  Eyes: Anicteric, PERRL  Neck: Supple, trachea midline  Lungs:  PRVC FiO2 35%  Heart: tachycardia  Abdomen:  Soft, nontender,   Extremities:  trace peripheral edema.  Neurologic: Intubated and sedated  Skin: No lesions  Access: Right IJ temp HD 1/25 Dr. KMortimer Fries   Basic Metabolic Panel: Recent Labs  Lab 07/25/19 0426 07/25/19 1557 07/26/19 0287801/30/21 0547 07/27/19 0676701/31/21 0209401/31/21 1031 07/27/19 1031 07/27/19 1603 07/27/19 1603 07/28/19 0426 07/28/19 1624 07/29/19 0443 07/29/19 0444 07/29/19 0758  NA 136   < > 134*   < > 133*   < > 135  --  135  --  136 136 135  --   --   K 4.9   < > 5.4*   < > 4.8   < > 4.5  --  4.4  --  3.6 3.6 3.7  --   --   CL 101   < > 101   < > 97*   < > 99  --  99  --  100 100 99  --   --   CO2 27   < > 26   < > 26   < > 23  --  30  --  26 28 25   --   --   GLUCOSE 181*   < > 205*   < > 258*   < > 254*   < > 215*  --  306* 180* 394*  --  460*  BUN 31*   < > 33*   < > 39*   < > 41*  --  44*   --  45* 40* 33*  --   --   CREATININE 1.13*   < > 1.25*   < > 1.24*   < > 1.17*  --  1.17*  --  1.20* 1.03* 0.93  --   --   CALCIUM 7.5*   < > 7.6*   < > 7.8*   < > 8.3*   < > 8.3*   < > 8.2* 8.7* 8.7*  --   --   MG 2.5*  --  2.5*  --  2.2  --   --   --   --   --  2.2  --   --  2.1  --   PHOS 3.3   < > 3.3   < > 3.6   < > 3.3  --  3.1  --  2.9 3.0 3.1  --   --    < > =  values in this interval not displayed.    Liver Function Tests: Recent Labs  Lab 07/27/19 1031 07/27/19 1603 07/28/19 0426 07/28/19 1624 07/29/19 0443  ALBUMIN 2.3* 2.3* 2.3* 2.2* 2.1*   No results for input(s): LIPASE, AMYLASE in the last 168 hours. No results for input(s): AMMONIA in the last 168 hours.  CBC: Recent Labs  Lab 07/25/19 0426 07/26/19 0418 07/27/19 0609 07/28/19 0426 07/29/19 0444  WBC 32.5* 34.1* 32.0* 36.4* 45.1*  NEUTROABS  --  28.3*  --   --   --   HGB 7.9* 8.0* 7.2* 7.2* 6.7*  HCT 26.3* 26.1* 24.3* 23.6* 22.1*  MCV 96.0 97.0 99.6 97.5 102.3*  PLT 369 242 125* 103* 91*    Cardiac Enzymes: No results for input(s): CKTOTAL, CKMB, CKMBINDEX, TROPONINI in the last 168 hours.  BNP: Invalid input(s): POCBNP  CBG: Recent Labs  Lab 07/29/19 0415 07/29/19 0431 07/29/19 0611 07/29/19 0715 07/29/19 0838  GLUCAP 38* 104* 98 40* 408*    Microbiology: Results for orders placed or performed during the hospital encounter of 07/14/2019  Culture, blood (Routine x 2)     Status: None   Collection Time: 07/19/2019  9:40 PM   Specimen: BLOOD  Result Value Ref Range Status   Specimen Description BLOOD RIGHT ANTECUBITAL  Final   Special Requests   Final    BOTTLES DRAWN AEROBIC AND ANAEROBIC Blood Culture adequate volume   Culture   Final    NO GROWTH 5 DAYS Performed at [facility], [address] [place], [place] [address]    Report Status 07/23/2019 FINAL  Final  Culture, blood (Routine x 2)     Status: None   Collection Time: 07/24/2019  9:40 PM   Specimen: BLOOD  Result  Value Ref Range Status   Specimen Description BLOOD LEFT ANTECUBITAL  Final   Special Requests   Final    BOTTLES DRAWN AEROBIC AND ANAEROBIC Blood Culture adequate volume   Culture   Final    NO GROWTH 5 DAYS Performed at [facility], [address] [place], [place] [address]    Report Status 07/23/2019 FINAL  Final  MRSA PCR Screening     Status: None   Collection Time: 07/19/19  6:45 AM   Specimen: Nasal Mucosa; Nasopharyngeal  Result Value Ref Range Status   MRSA by PCR NEGATIVE NEGATIVE Final    Comment:        The GeneXpert MRSA Assay (FDA approved for NASAL specimens only), is one component of a comprehensive MRSA colonization surveillance program. It is not intended to diagnose MRSA infection nor to guide or monitor treatment for MRSA infections. Performed at [facility], [address] [place], [place] [address]   Respiratory Panel by PCR     Status: None   Collection Time: 07/19/19 10:03 AM   Specimen: Nasopharyngeal Swab; Respiratory  Result Value Ref Range Status   Adenovirus NOT DETECTED NOT DETECTED Final   Coronavirus 229E NOT DETECTED NOT DETECTED Final    Comment: (NOTE) The Coronavirus on the Respiratory Panel, DOES NOT test for the novel  Coronavirus (2019 nCoV)    Coronavirus HKU1 NOT DETECTED NOT DETECTED Final   Coronavirus NL63 NOT DETECTED NOT DETECTED Final   Coronavirus OC43 NOT DETECTED NOT DETECTED Final   Metapneumovirus NOT DETECTED NOT DETECTED Final   Rhinovirus / Enterovirus NOT DETECTED NOT DETECTED Final   Influenza A NOT DETECTED NOT DETECTED Final   Influenza B NOT DETECTED NOT DETECTED Final  Parainfluenza Virus 1 NOT DETECTED NOT DETECTED Final   Parainfluenza Virus 2 NOT DETECTED NOT DETECTED Final   Parainfluenza Virus 3 NOT DETECTED NOT DETECTED Final   Parainfluenza Virus 4 NOT DETECTED NOT DETECTED Final   Respiratory Syncytial Virus NOT DETECTED NOT DETECTED Final   Bordetella pertussis NOT DETECTED  NOT DETECTED Final   Chlamydophila pneumoniae NOT DETECTED NOT DETECTED Final   Mycoplasma pneumoniae NOT DETECTED NOT DETECTED Final    Comment: Performed at [facility], [place] [address] [place], [place] [address]    Coagulation Studies: No results for input(s): LABPROT, INR in the last 72 hours.  Urinalysis: No results for input(s): COLORURINE, LABSPEC, PHURINE, GLUCOSEU, HGBUR, BILIRUBINUR, KETONESUR, PROTEINUR, UROBILINOGEN, NITRITE, LEUKOCYTESUR in the last 72 hours.  Invalid input(s): APPERANCEUR    Imaging: No results found.   Medications:   . sodium chloride 10 mL/hr at 07/28/19 2000  . amiodarone 30 mg/hr (07/29/19 0800)  . dexmedetomidine (PRECEDEX) IV infusion Stopped (07/29/19 0616)  . DOBUTamine Stopped (07/28/19 1802)  . feeding supplement (VITAL HIGH PROTEIN) 50 mL/hr at 07/29/19 0700  . fentaNYL infusion INTRAVENOUS 75 mcg/hr (07/29/19 0800)  . norepinephrine (LEVOPHED) Adult infusion 40 mcg/min (07/29/19 0800)  . piperacillin-tazobactam Stopped (07/29/19 8338)  . prismasol BGK 2/2.5 dialysis solution 1,500 mL/hr at 07/29/19 0717  . prismasol BGK 2/2.5 replacement solution 300 mL/hr at 07/28/19 0846  . prismasol BGK 2/2.5 replacement solution    . vasopressin (PITRESSIN) infusion - *FOR SHOCK* 0.03 Units/min (07/29/19 0800)   . atorvastatin  40 mg Per Tube q1800  . B-complex with vitamin C  1 tablet Per Tube Daily  . chlorhexidine gluconate (MEDLINE KIT)  15 mL Mouth Rinse BID  . Chlorhexidine Gluconate Cloth  6 each Topical Daily  . famotidine  20 mg Per Tube QHS  . feeding supplement (PRO-STAT SUGAR FREE 64)  30 mL Per Tube Daily  . insulin aspart  2-6 Units Subcutaneous Q4H  . insulin aspart  3 Units Subcutaneous Q4H  . insulin detemir  8 Units Subcutaneous BID  . ipratropium-albuterol  3 mL Nebulization BID  . mouth rinse  15 mL Mouth Rinse 10 times per day  . midodrine  10 mg Oral TID WC  . multivitamin  15 mL Per Tube Daily  . polyvinyl  alcohol  2 drop Both Eyes TID  . senna-docusate  1 tablet Per Tube BID   albuterol, fentaNYL, heparin, midazolam, morphine injection, phenol, sodium chloride flush  Assessment/ Plan:  Ms. Andrea [name] is a 74 y.o. 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 Acute renal failure requiring renal replacement therapy on chronic kidney disease stage IIIB with proteinuria: baseline creatinine 1.5, GFR of 34.   Chronic Kidney Disease secondary to diabetes and hypertension Acute renal failure secondary to ATN from sepsis and pneumonia Anuric urine output - Continue CRRT: CVVHDF. - Change ultrafiltration goal to be even.   2. Anemia with renal failure: with thrombocytopenia. Hemoglobin 6.7 - transfusion as per ICU team  3. Acute respiratory failure requiring mechanical ventilation: with COPD with chronic oxygen requirements. Acute exacerbation of COPD.   4. Sepsis with shock: secondary to pneumonia. COVID-19 negative.  Requiring multiple vasopressors.  - empiric pip/tazo.   Overall prognosis remains  critical. Will reach out to family later today. Appreciate palliative care.    LOS: 11 Roran Wegner 2/2/20219:06 AM

## 2019-07-29 NOTE — Progress Notes (Signed)
CBG 40. Gave 50cc ampule of dextrose. CBG monitor unable to re-check due to edema. MD paged and order placed for stat lab glucose.

## 2019-07-29 NOTE — Progress Notes (Addendum)
Palliative:  HPI:74 y.o.femalewith past medical history of COPD, CHF, atrial fibrillation, CAD s/p CABG 2020, h/o MI, h/o PE, hypertension, CKD stage 3, diabetes, breast cancer in remissionadmitted on 1/22/2021with shortness of breath.Hospitalization 3 times over Nov-Dec 2020 with COVID pneumonia. Reportedly had steady improvement until ~3 days prior to this admission when she became more short of breath and with dry cough and diagnosed with multifocal pneumonia. Acute decompensation and required intubation 1/23 and was extubated 1/24 but now with increased work of breathing and requiring BiPAP. Continues to be high risk for re-intubation with COVID pneumonitis and severe COPD exacerbation.07/21/19 Re-intubated after my discussion and visit on 07/22/19 on CRRT and vent support as well as requiring vasopressor support and amiodarone infusion.2/2 She continues on vent support and increasing need for vasopressor support; dialysis limited by hypotension.    I came today to Ms. Hurrell's bedside. She appears much worse than she did when I last saw her last weak. I discussed with RN and Dr. Patsey Berthold. She is maxed out on Levophed and vasopressin added and although BP more stable RN reports they are unable to pull off fluid with dialysis. Still no urine output (even without pulling off any fluid with dialysis). Needs blood transfusion. Most concerning to me is cool and cyanotic fingers and toes bilaterally. She does still respond to me but a little more delayed than last week. I am very concerned that she has had significant decline even with max support.   I called and spoke with daughter, [name]. I explained the above to her. I am concerned that Andrea [name] is approaching the end of her life. Unfortunately, I do not see how she can improve from here. WBC are also up to 45. I think her body is too weak to utilize the resources that we are providing to her body to heal. There is little else that can be added to  support her past what we are already doing. I am afraid that she will continue to decline and is high risk for cardiac arrest. I did share these concerns with [name] who plans to discuss further with her sisters.   I also met with granddaughter, Miquel Dunn, outside room. I explained some of the above assessment and concerns with her. She understands that her mother is critically ill. I spent time with Miquel Dunn offering support and therapeutic listening. Encouraged her to spend time with her grandmother and to just be there to support her mother.   I checked in with [name] at the end of the day. Her sister is flying back tomorrow and we will plan for meeting with the daughters to discuss how to move forward and decisions at hand.   All questions/concerns addressed to the best of my ability. Emotional support provided.   Exam: Arousable, attempts to follow commands (limited by weakness). HR tachycardic 110s. Breathing regular, unlabored on vent support. Abd soft. Generalized edema. Bilat toes and fingers cold and cyanotic.   Plan: - Hopeful that I can meet with 3 daughters tomorrow to continue goals of care conversation and offer support.   Blakely, NP Palliative Medicine Team Pager [phone] (Please see [email or web address] for schedule) Team Phone [phone]    Greater than 50%  of this time was spent counseling and coordinating care related to the above assessment and plan

## 2019-07-29 NOTE — Progress Notes (Addendum)
Lab glucose is 460. CBG from purple port trialysis line is 408. CBG taken in fingers are inacurate. MD made aware. Morning insulins administered per MD. Will continue to monitor via central line.

## 2019-07-29 NOTE — Progress Notes (Signed)
Palliative:  I just received a message from daughter, [name], that family have decided to place DNR but continue current treatments and aggressive care. I will follow up with them again tomorrow.   Vinie Sill, NP Palliative Medicine Team Pager [phone] (Please see [email or web address] for schedule) Team Phone [phone]

## 2019-07-29 NOTE — Progress Notes (Signed)
CRRT interrupted from [place] until 2020 to install new set. Treatment restarted at [id number] without complication.

## 2019-07-29 NOTE — Progress Notes (Signed)
Spoke with Dr. Mortimer Fries regarding patients temperature increase to 99 from 97.9 with no other change first 15 minutes. At this time continue to infuse blood.

## 2019-07-29 NOTE — Progress Notes (Signed)
CRITICAL CARE NOTE BRIEF PATIENT DESCRIPTION:  74 yo female dx with COVID-19 04/2019 admitted with acute on chronic renal failure with hyperkalemia, elevated troponin, and acute on chronic hypoxic hypercapnic respiratory failure secondary to bacterial pneumonia vs. COVID-19 pneumonitis requiring mechanical intubation    SYNOPSIS This is a 74 yo female with a PMH of HTN, MI, CABG (07/2018)Type II Diabetes Mellitus, COPD, Chronic Home O2 @2L , Chronic Atrial Fibrillation (on eliquis), Chronic Diastolic CHF (EF 40 to 86% via Echo 05/2019), CKD Stage III (baseline creatinine 1.6-1.9), and COVID-19 (dx 05/15/2019). She presented to [facility] ER via EMS on 01/22 with worsening shortness of breath, cough, and fatigue. At baseline pt wears chronic home O2 @2L , however due to symptoms she has had to increase her O2 to 4L 24hrs prior to current ER presentation. En route to the ER EMS placed pt on 6L O2 and administered albuterol. Upon arrival to the ER pt tachypneic with increased work of breathing requiring iv steroids, duonebs, and HFNC. Lab results revealed Na+ 133, CO2 21, BUN 38, creatinine 1.78, glucose 300, anion gap 17, troponin 90, lactic acid 6.2, pct 1.11, wbc 13.7, hgb 8.4, and vbg pH 7.38/pCO2 42. CXR concerning for multifocal pneumonia and small bilateral pleural effusions. She received solumedrol, vancomycin, and cefepime. She was subsequently admitted to the stepdown unit per hospitalist team for additional workup and treatment, however remained in the ER pending bed availability. On 01/23 pt developed worsening acute hypoxic respiratory failure possibly due to flash pulmonary edema after receiving a total of 3L of fluids requiring mechanical intubation. PCCM contacted to assume care.  Pt does have a hx of COVID-19 diagnosed 05/14/2020, which required hospitalization/treatment at [facility]. Pt discharged home on 05/20/2019. However, she required hospitalization again at [facility] on  05/31/2019 due to multifocal pneumonia, following treatment she was discharged home on 06/29/2019.   CC  follow up respiratory failure  SUBJECTIVE Patient remains critically ill, looks miserable on the ventilator Continues to be dependent on pressors Continues to be dependent on the ventilator Acute multiorgan failure on chronic severe multiorgan dysfunction  BP (!) 140/52   Pulse (!) 115   Temp 98 F (36.7 C) (Axillary)   Resp (!) 22   Ht 5' 2"  (1.575 m)   Wt 68.2 kg   SpO2 99%   BMI 27.50 kg/m    I/O last 3 completed shifts: In: 4603.1 [I.V.:2774.4; NG/GT:1660; IV Piggyback:168.7] Out: 3817 [Urine:15; Other:6405] Total I/O In: 1133.2 [I.V.:558.3; Blood:240; NG/GT:285; IV Piggyback:49.9] Out: 970 [Other:970]  SpO2: 99 % O2 Flow Rate (L/min): 40 L/min FiO2 (%): 35 %   SIGNIFICANT EVENTS 01/22: Pt admitted to the stepdown unit with acute hypoxic respiratory failure secondary to multifocal pneumonia, however remained in the ER pending bed availability 01/23: Pt developed worsening acute hypoxic respiratory failure requiring emergent mechanical intubation in the ER. PCCM contacted to assume care 1/24- parameters for SBT met, patient s/p weaning trial with successful liberation from MV. Updated daughter [name].  1/25 increased WOB and back on biPAP, re-intubated, started on CRRT, family updated multiple times 1/26 severe multiorgan failure, cardiogenic shock, on CRRT, multiple vasopressors 1/27via multiorgan failure and cardiogenic shock CRRT multiple pressors 1/2fo2 increased to 50%, unable to perform SAT/SBT Remains in cardiogenic shock On CRRT and multiple vasopressors 1/30 on CRRT, multiple vasopressors, multiorgan failure 2/01 continues to be dependent on pressors, CRRT and ventilator.  Updated grandson and daughter (Marlaine Hind 2/02 worsening anemia, transfuse 1 unit of PRBCs   REVIEW OF SYSTEMS  PATIENT  IS UNABLE TO PROVIDE COMPLETE REVIEW OF SYSTEMS DUE TO  SEVERE CRITICAL ILLNESS   PHYSICAL EXAMINATION:  GENERAL:critically ill appearing, intubated mechanically ventilated  HEAD: Normocephalic, atraumatic.  EYES: Pupils equal, round, reactive to light.  Mild scleral icterus.  MOUTH: Orotracheally intubated, OG in place NECK: Supple.  Trachea midline PULMONARY: Coarse sounds with rhonchi in the upper lung zones CARDIOVASCULAR: S1 and S2.  Sinus rhythm on the monitor. No murmurs, rubs, or gallops.Mottled fingertips and toes.  GASTROINTESTINAL: Soft,non-distended.  Positive bowel sounds.   MUSCULOSKELETAL: No joint swelling, no clubbing, 3+ anasarca NEUROLOGIC: Opens eyes, does not track, unable to do SAT/SBT due to severity of illness SKIN: Mottled, anasarca as noted above  MEDICATIONS: I have reviewed all medications and confirmed regimen as documented   CULTURE RESULTS   No results found for this or any previous visit (from the past 240 hour(s)).      CBC    Component Value Date/Time   WBC 45.1 (H) 07/29/2019 0444   RBC 2.16 (L) 07/29/2019 0444   HGB 6.7 (L) 07/29/2019 0444   HCT 22.1 (L) 07/29/2019 0444   PLT 91 (L) 07/29/2019 0444   MCV 102.3 (H) 07/29/2019 0444   MCH 31.0 07/29/2019 0444   MCHC 30.3 07/29/2019 0444   RDW 22.1 (H) 07/29/2019 0444   LYMPHSABS 2.0 07/26/2019 0418   MONOABS 2.0 (H) 07/26/2019 0418   EOSABS 0.3 07/26/2019 0418   BASOSABS 0.0 07/26/2019 0418   BMP Latest Ref Rng & Units 07/29/2019 07/29/2019 07/28/2019  Glucose 70 - 99 mg/dL 460(H) 394(H) 180(H)  BUN 8 - 23 mg/dL - 33(H) 40(H)  Creatinine 0.44 - 1.00 mg/dL - 0.93 1.03(H)  Sodium 135 - 145 mmol/L - 135 136  Potassium 3.5 - 5.1 mmol/L - 3.7 3.6  Chloride 98 - 111 mmol/L - 99 100  CO2 22 - 32 mmol/L - 25 28  Calcium 8.9 - 10.3 mg/dL - 8.7(L) 8.7(L)      IMAGING    No results found.     Indwelling Urinary Catheter continued, requirement due to   Reason to continue Indwelling Urinary Catheter strict Intake/Output monitoring for hemodynamic  instability   Central Line/ continued, requirement due to  Reason to continue [place] of central venous pressure or other hemodynamic parameters and poor IV access   Ventilator continued, requirement due to severe respiratory failure   Ventilator Sedation RASS 0 to -2      ASSESSMENT AND PLAN SYNOPSIS  SEVERE ACUTE HYPOXIC AND HYPERCAPNIC RESP FAILURE FROM ACUTE COVID 19 PNEUMONITIS AND SEEVERE COPD EXACERBATION WITH SEVERE SYSTOLIC CHF EXACERBATION WITH PROGRESSIVE CARDIORENAL SYNDROME   Severe ACUTE Hypoxic and Hypercapnic Respiratory Failure Continue ventilator support, unable to wean due to severity of multiorgan failure SBT/SAT unable to be performed due to the severity of her critical illness Prognosis is dismal, have recommended transitioning to comfort care to the family  ACUTE SYSTOLIC CARDIAC FAILURE- EF 25% Persistent cardiogenic shock NSTEMI-cannot be anticoagulated: Bloody ET tube secretions, thrombocytopenia Afib with RVR, maintained NSR on amiodarone  ACUTE KIDNEY INJURY/Renal Failure On CRRT Continues with issues with volume overload Tolerating poorly requiring pressors  NEUROLOGY - intubated and sedated - minimal sedation to achieve a RASS goal: -1  SHOCK-SEPSIS/CARDIOGENIC -use vasopressors to keep MAP>65 -follow ABG and LA On piperacillin tazobactam  CARDIAC ICU monitoring  ID Continue piperacillin tazobactam Leukocytosis due to leukemoid reaction Steroids discontinued  GI GI PROPHYLAXIS as indicated  NUTRITIONAL STATUS DIET-->TF's as tolerated Constipation protocol as indicated  ENDO -On ICU hyperglycemia protocol   ELECTROLYTES -follow labs as needed -replace as needed -pharmacy consultation and following   DVT/GI PRX ordered TRANSFUSIONS AS NEEDED: Will need transfusion today due to hemoglobin 6.7 MONITOR FSBS ASSESS the need for LABS as needed   Critical Care Time devoted to patient care services described  in this note is 40 minutes.   Overall, patient is critically ill, prognosis is guarded.  Patient with acute multiorgan failure in the setting of severe pre-existing multiorgan dysfunction and at high risk for cardiac arrest and death.   VERY POOR CHANCE OF MEANINGFUL RECOVERY RECOMMEND DNR STATUS and transitioning to comfort care.  Discussed at length with patient's grandson at bedside and with patient's daughter Marlaine Hind) over the phone.  They are gathering family and are to make a decision with regards to the patient's care moving on forward.  They are aware of her exceedingly poor prognosis.   Renold Don, MD [facility]   *This note was dictated using voice recognition software/Dragon.  Despite best efforts to proofread, errors can occur which can change the meaning.  Any change was purely unintentional.

## 2019-07-29 NOTE — Progress Notes (Signed)
Dr. Juleen China notified of low potassium level. New orders received for 4k bath. Palliative care will meet with daughters tomorrow. One unit of blood administered, per Dr. Patsey Berthold will recheck hemoglobin in AM. Ultrafiltration stopped due to patients hypotension.Titrate levophed as blood pressures tolerate.

## 2019-07-29 NOTE — Progress Notes (Signed)
Inpatient Diabetes Program Recommendations  AACE/ADA: New Consensus Statement on Inpatient Glycemic Control (2015)  Target Ranges:  Prepandial:   less than 140 mg/dL      Peak postprandial:   less than 180 mg/dL (1-2 hours)      Critically ill patients:  140 - 180 mg/dL   Lab Results  Component Value Date   GLUCAP 408 (H) 07/29/2019   HGBA1C 7.9 (H) 07/25/2019    Review of Glycemic Control   Diabetes history: DM 2 Outpatient Diabetes medications: Glucotrol 10 mg bid + Januvia 50 mg qd  Current orders for Inpatient glycemic control:  Levemir 8 units bid ICU Novolog Resistant 3-9 units Q4 hours  Vital HP 50 ml/hour BUN/Creat: 33/0.93  Inpatient Diabetes Program Recommendations:     Hypoglycemia however pt edematous and Fingersticks may not be accurate. "Hypoglycemia" treated with 2 amps of D50 resulting in hyperglycemia of 408. Spoke with RN, plan to get blood from central line for glucose sample. No adjustments for now. Will continue to monitor.   Thanks,  Tama Headings RN, MSN, BC-ADM Inpatient Diabetes Coordinator Team Pager [phone] (8a-5p)

## 2019-07-30 LAB — RENAL FUNCTION PANEL
Albumin: 2 g/dL — ABNORMAL LOW (ref 3.5–5.0)
Albumin: 2 g/dL — ABNORMAL LOW (ref 3.5–5.0)
Anion gap: 7 (ref 5–15)
Anion gap: 5 (ref 5–15)
BUN: 33 mg/dL — ABNORMAL HIGH (ref 8–23)
BUN: 28 mg/dL — ABNORMAL HIGH (ref 8–23)
CO2: 28 mmol/L (ref 22–32)
CO2: 29 mmol/L (ref 22–32)
Calcium: 8 mg/dL — ABNORMAL LOW (ref 8.9–10.3)
Calcium: 7.9 mg/dL — ABNORMAL LOW (ref 8.9–10.3)
Chloride: 102 mmol/L (ref 98–111)
Chloride: 102 mmol/L (ref 98–111)
Creatinine, Ser: 0.9 mg/dL (ref 0.44–1.00)
Creatinine, Ser: 0.83 mg/dL (ref 0.44–1.00)
GFR calc Af Amer: >60 mL/min (ref >60)
GFR calc Af Amer: >60 mL/min (ref >60)
GFR calc non Af Amer: >60 mL/min (ref >60)
GFR calc non Af Amer: >60 mL/min (ref >60)
Glucose, Bld: 114 mg/dL — ABNORMAL HIGH (ref 70–99)
Glucose, Bld: 177 mg/dL — ABNORMAL HIGH (ref 70–99)
Phosphorus: 1.8 mg/dL — ABNORMAL LOW (ref 2.5–4.6)
Phosphorus: 3.2 mg/dL (ref 2.5–4.6)
Potassium: 3.4 mmol/L — ABNORMAL LOW (ref 3.5–5.1)
Potassium: 4 mmol/L (ref 3.5–5.1)
Sodium: 137 mmol/L (ref 135–145)
Sodium: 136 mmol/L (ref 135–145)

## 2019-07-30 LAB — CBC WITH DIFFERENTIAL/PLATELET
Abs Immature Granulocytes: 1.67 10*3/uL — ABNORMAL HIGH (ref 0.00–0.07)
Basophils Absolute: 0 10*3/uL (ref 0.0–0.1)
Basophils Relative: 0 %
Eosinophils Absolute: 0 10*3/uL (ref 0.0–0.5)
Eosinophils Relative: 0 %
HCT: 26.4 % — ABNORMAL LOW (ref 36.0–46.0)
Hemoglobin: 8.4 g/dL — ABNORMAL LOW (ref 12.0–15.0)
Immature Granulocytes: 4 %
Lymphocytes Relative: 8 %
Lymphs Abs: 3.2 10*3/uL (ref 0.7–4.0)
MCH: 31.7 pg (ref 26.0–34.0)
MCHC: 31.8 g/dL (ref 30.0–36.0)
MCV: 99.6 fL (ref 80.0–100.0)
Monocytes Absolute: 2.9 10*3/uL — ABNORMAL HIGH (ref 0.1–1.0)
Monocytes Relative: 7 %
Neutro Abs: 34.4 10*3/uL — ABNORMAL HIGH (ref 1.7–7.7)
Neutrophils Relative %: 81 %
Platelets: 48 10*3/uL — ABNORMAL LOW (ref 150–400)
RBC: 2.65 MIL/uL — ABNORMAL LOW (ref 3.87–5.11)
RDW: 21.4 % — ABNORMAL HIGH (ref 11.5–15.5)
WBC: 42.2 10*3/uL — ABNORMAL HIGH (ref 4.0–10.5)
nRBC: 14.5 % — ABNORMAL HIGH (ref 0.0–0.2)

## 2019-07-30 LAB — GLUCOSE, CAPILLARY
Glucose-Capillary: 129 mg/dL — ABNORMAL HIGH (ref 70–99)
Glucose-Capillary: 37 mg/dL — CL (ref 70–99)
Glucose-Capillary: 60 mg/dL — ABNORMAL LOW (ref 70–99)
Glucose-Capillary: 184 mg/dL — ABNORMAL HIGH (ref 70–99)
Glucose-Capillary: 152 mg/dL — ABNORMAL HIGH (ref 70–99)
Glucose-Capillary: 181 mg/dL — ABNORMAL HIGH (ref 70–99)
Glucose-Capillary: 101 mg/dL — ABNORMAL HIGH (ref 70–99)

## 2019-07-30 LAB — BPAM RBC
Blood Product Expiration Date: 202102232359
ISSUE DATE / TIME: 202102021445
Unit Type and Rh: 6200

## 2019-07-30 LAB — TYPE AND SCREEN
ABO/RH(D): A POS
Antibody Screen: NEGATIVE
Unit division: 0

## 2019-07-30 LAB — MAGNESIUM: Magnesium: 2.3 mg/dL (ref 1.7–2.4)

## 2019-07-30 MED ORDER — SENNOSIDES-DOCUSATE SODIUM 8.6-50 MG PO TABS: 1.0000 | ORAL_TABLET | Freq: Two times a day (BID) | ORAL | Status: DC | PRN

## 2019-07-30 MED ORDER — SODIUM PHOSPHATES 45 MMOLE/15ML IV SOLN
20.0000 mmol | Freq: Once | INTRAVENOUS | Status: DC
  Filled 2019-07-30: qty 6.67

## 2019-07-30 MED ORDER — DEXTROSE 50 % IV SOLN
INTRAVENOUS | Status: AC
  Administered 2019-07-30: 100 mL
  Filled 2019-07-30: qty 100

## 2019-07-30 MED ORDER — POTASSIUM PHOSPHATES 15 MMOLE/5ML IV SOLN
15.0000 mmol | Freq: Once | INTRAVENOUS | Status: AC
  Administered 2019-07-30: 15 mmol via INTRAVENOUS
  Filled 2019-07-30: qty 5

## 2019-07-30 NOTE — Progress Notes (Signed)
CRITICAL CARE NOTE BRIEF PATIENT DESCRIPTION:  74 yo female dx with COVID-19 04/2019 admitted with acute on chronic renal failure with hyperkalemia, elevated troponin, and acute on chronic hypoxic hypercapnic respiratory failure secondary to bacterial pneumonia vs. COVID-19 pneumonitis requiring mechanical intubation   SYNOPSIS This is a 74 yo female with a PMH of HTN, MI, CABG (07/2018)Type II Diabetes Mellitus, COPD, Chronic Home O2 '@2L'$ , Chronic Atrial Fibrillation (on eliquis), Chronic Diastolic CHF (EF 40 to 09% via Echo 05/2019), CKD Stage III (baseline creatinine 1.6-1.9), and COVID-19 (dx 05/15/2019). She presented to [facility] ER via EMS on 01/22 with worsening shortness of breath, cough, and fatigue. At baseline pt wears chronic home O2 '@2L'$ , however due to symptoms she has had to increase her O2 to 4L 24hrs prior to current ER presentation. En route to the ER EMS placed pt on 6L O2 and administered albuterol. Upon arrival to the ER pt tachypneic with increased work of breathing requiring iv steroids, duonebs, and HFNC. Lab results revealed Na+ 133, CO2 21, BUN 38, creatinine 1.78, glucose 300, anion gap 17, troponin 90, lactic acid 6.2, pct 1.11, wbc 13.7, hgb 8.4, and vbg pH 7.38/pCO2 42. CXR concerning for multifocal pneumonia and small bilateral pleural effusions. She received solumedrol, vancomycin, and cefepime. She was subsequently admitted to the stepdown unit per hospitalist team for additional workup and treatment, however remained in the ER pending bed availability. On 01/23 pt developed worsening acute hypoxic respiratory failure possibly due to flash pulmonary edema after receiving a total of 3L of fluids requiring mechanical ventilation. PCCM contacted to assume care.  Pt does have a hx of COVID-19 diagnosed 05/14/2020, which required hospitalization/treatment at [facility]. Pt discharged home on 05/20/2019. However, she required hospitalization again at [facility] on 05/31/2019  due to multifocal pneumonia, following treatment she was discharged home on 06/29/2019.   CC  follow up respiratory failure  SUBJECTIVE Patient remains critically ill, sedated on the ventilator Continues to be dependent on pressors Continues to be dependent on the ventilator Acute multiorgan failure on chronic severe multiorgan dysfunction  BP (!) 118/54   Pulse (!) 118   Temp 98.6 F (37 C) (Axillary)   Resp (!) 21   Ht '5\' 2"'$  (1.575 m)   Wt 69.6 kg   SpO2 99%   BMI 28.06 kg/m    I/O last 3 completed shifts: In: 3631.8 [I.V.:2043.3; Blood:360; NG/GT:670; IV Piggyback:558.5] Out: 3678.9 [Emesis/NG output:250; Other:3428.9] Total I/O In: 151.3 [I.V.:151.3] Out: 169 [Other:169]  SpO2: 99 % O2 Flow Rate (L/min): 40 L/min FiO2 (%): 32 %   SIGNIFICANT EVENTS 01/22: Pt admitted to the stepdown unit with acute hypoxic respiratory failure secondary to multifocal pneumonia, however remained in the ER pending bed availability 01/23: Pt developed worsening acute hypoxic respiratory failure requiring emergent mechanical intubation in the ER. PCCM contacted to assume care 1/24- parameters for SBT met, patient s/p weaning trial with successful liberation from MV. Updated daughter [name].  1/25 increased WOB and back on biPAP, re-intubated, started on CRRT, family updated multiple times 1/26 severe multiorgan failure, cardiogenic shock, on CRRT, multiple vasopressors 1/27via multiorgan failure and cardiogenic shock CRRT multiple pressors 1/73fo2 increased to 50%, unable to perform SAT/SBT Remains in cardiogenic shock On CRRT and multiple vasopressors 1/30 on CRRT, multiple vasopressors, multiorgan failure 2/01 continues to be dependent on pressors, CRRT and ventilator.  Updated grandson and daughter (Marlaine Hind 2/02 worsening anemia, transfuse 1 unit of PRBCs 2/03 Palliative Care discussing transitioning to comfort with family  REVIEW OF SYSTEMS  PATIENT IS UNABLE TO PROVIDE  COMPLETE REVIEW OF SYSTEMS DUE TO SEVERE CRITICAL ILLNESS   PHYSICAL EXAMINATION:  GENERAL:critically ill appearing, intubated mechanically ventilated  HEAD: Normocephalic, atraumatic.  EYES: Pupils equal, round, reactive to light.  Mild scleral icterus.  MOUTH: Orotracheally intubated, OG in place NECK: Supple.  Trachea midline PULMONARY: Coarse sounds with rhonchi in the upper lung zones CARDIOVASCULAR: S1 and S2.  Sinus rhythm on the monitor. No murmurs, rubs, or gallops.Mottled fingertips and toes.   GASTROINTESTINAL: Soft,non-distended.  Positive bowel sounds.  Tolerating tube feeds MUSCULOSKELETAL: No joint swelling, no clubbing, 3+ anasarca NEUROLOGIC: Opens eyes, does not track, unable to do SAT/SBT due to severity of illness SKIN: Generalized mottling, anasarca as noted above  MEDICATIONS: I have reviewed all medications and confirmed regimen as documented   CULTURE RESULTS   No results found for this or any previous visit (from the past 240 hour(s)).      CBC    Component Value Date/Time   WBC 42.2 (H) 08/17/2019 0447   RBC 2.65 (L) 08/23/2019 0447   HGB 8.4 (L) 08/06/2019 0447   HCT 26.4 (L) 08/16/2019 0447   PLT 48 (L) 08/04/2019 0447   MCV 99.6 08/18/2019 0447   MCH 31.7 08/16/2019 0447   MCHC 31.8 08/06/2019 0447   RDW 21.4 (H) 08/03/2019 0447   LYMPHSABS 3.2 08/05/2019 0447   MONOABS 2.9 (H) 08/20/2019 0447   EOSABS 0.0 08/21/2019 0447   BASOSABS 0.0 08/11/2019 0447   BMP Latest Ref Rng & Units 08/03/2019 08/19/2019 07/29/2019  Glucose 70 - 99 mg/dL 177(H) 114(H) 143(H)  BUN 8 - 23 mg/dL 28(H) 33(H) 32(H)  Creatinine 0.44 - 1.00 mg/dL 0.83 0.90 0.83  Sodium 135 - 145 mmol/L 136 137 138  Potassium 3.5 - 5.1 mmol/L 4.0 3.4(L) 2.9(L)  Chloride 98 - 111 mmol/L 102 102 101  CO2 22 - 32 mmol/L _0 Calcium 8.9 - 10.3 mg/dL 7.9(L) 8.0(L) 9.1      IMAGING    No results found.     Indwelling Urinary Catheter  she is anuric, will DC Foley catheter   Reason to continue Indwelling Urinary Catheter   Central Line/ continued, requirement due to  Reason to continue [place] of central venous pressure or other hemodynamic parameters and poor IV access   Ventilator continued, requirement due to severe respiratory failure   Ventilator Sedation RASS 0 to -2      ASSESSMENT AND PLAN SYNOPSIS  SEVERE ACUTE HYPOXIC AND HYPERCAPNIC RESP FAILURE FROM ACUTE COVID 19 PNEUMONITIS AND SEEVERE COPD EXACERBATION WITH SEVERE SYSTOLIC CHF EXACERBATION WITH PROGRESSIVE CARDIORENAL SYNDROME   Severe ACUTE Hypoxic and Hypercapnic Respiratory Failure Continue ventilator support, unable to wean due to severity of multiorgan failure SBT/SAT unable to be performed due to the severity of her critical illness Prognosis is dismal, continue to recommend transitioning to comfort care to the family  ACUTE SYSTOLIC CARDIAC FAILURE- EF 25% Persistent cardiogenic shock NSTEMI-cannot be fully anticoagulated: Bloody ET tube secretions, thrombocytopenia Afib with RVR, maintained NSR on amiodarone  ACUTE KIDNEY INJURY/Renal Failure On CRRT Continues with issues with volume overload Tolerating poorly, requiring pressors  NEUROLOGY - intubated and sedated - minimal sedation to achieve a RASS goal: -1  SHOCK-SEPSIS/CARDIOGENIC -use vasopressors to keep MAP>65 -follow ABG and LA On piperacillin tazobactam  CARDIAC ICU monitoring  ID Continue piperacillin tazobactam Leukocytosis due to leukemoid reaction Steroids discontinued  GI GI PROPHYLAXIS as indicated  NUTRITIONAL STATUS DIET-->TF's  as tolerated Constipation protocol as indicated  ENDO -On ICU hyperglycemia protocol   ELECTROLYTES -follow labs as needed -replace as needed -pharmacy consultation and following   DVT/GI PRX ordered TRANSFUSIONS AS NEEDED: Will need transfusion today due to hemoglobin 6.7 MONITOR FSBS ASSESS the need for LABS as needed   Critical Care  Time devoted to patient care services described in this note is 40 minutes.   Overall, patient is critically ill, prognosis is guarded.  Patient with acute multiorgan failure in the setting of severe pre-existing multiorgan dysfunction and at high risk for cardiac arrest and death.   VERY POOR CHANCE OF MEANINGFUL RECOVERY SHE IS NOW DNR STATUS also recommend transitioning to comfort care, family considering.  Discussed during multidisciplinary rounds, discussed with Palliative Care, discussed with Dr. Juleen China.   Renold Don, MD [facility] [facility]   *This note was dictated using voice recognition software/Dragon.  Despite best efforts to proofread, errors can occur which can change the meaning.  Any change was purely unintentional.

## 2019-07-30 NOTE — Progress Notes (Signed)
Rhine visited pt. as follow up on request for daily chaplain support; dtrs Lattie Haw and [place] @ bedside.  Lisa saud pt. has not been awake today (sedated) but talked to pt. making her aware of dtrs' presence.  CH got dtrs additional chair for anticipated arrival of third dtr ([name]).  Family did not discuss any change in pt.'s condition relative to past conversations.  CH remains available as needed.  Additional chaplain visit may be helpful after [name] arrives for family meeting.      08/05/2019 1530  Clinical Encounter Type  Visited With Patient and family together  Visit Type Follow-up;Psychological support;Social support;Critical Care  Spiritual Encounters  Spiritual Needs Emotional  Stress Factors  Family Stress Factors Health changes;Major life changes

## 2019-07-30 NOTE — Consult Note (Signed)
Pharmacy Antibiotic Note  Andrea [name] is a 74 y.o. female with medical history including MI, CAD s/p CABG, T2DM, CHF, CKD stage III, and COVID 19 (04/2019) admitted on 07/02/2019 with pneumonia. Patient is critically ill in the ICU on the ventilator for acute respiratory failure and with AKI requiring CRRT. Pharmacy has been consulted for pip/tazo dosing.  WBC trending up to 42.2 today. Procalcitonin as of 01/30 is 0.35. Patient is afebrile.   Plan: pip/tazo 3.375 g q6h (CRRT dosing)  Continue to follow antibiotic plan. Adjust dosing as indicated for RRT / renal function. Will discuss tomorrow in am rounds to continue or discontinue antibiotic therapy.   Antibiotic Day So Far for Pneumonia Therapy: 10 days Total antibiotic therapy: 13 days  Height: 5\' 2"  (157.5 cm) Weight: 153 lb 7 oz (69.6 kg) IBW/kg (Calculated) : 50.1  Temp (24hrs), Avg:98.2 F (36.8 C), Min:97.6 F (36.4 C), Max:99 F (37.2 C)  Recent Labs  Lab 07/26/19 0418 07/26/19 1451 07/27/19 0609 07/27/19 1031 07/28/19 0426 07/28/19 1624 07/29/19 0443 07/29/19 0444 07/29/19 1600 08/23/2019 0447  WBC 34.1*  --  32.0*  --  36.4*  --   --  45.1*  --  42.2*  CREATININE 1.25*   < > 1.24*   < > 1.20* 1.03* 0.93  --  0.83 0.90   < > = values in this interval not displayed.    Estimated Creatinine Clearance: 51.6 mL/min (by C-G formula based on SCr of 0.9 mg/dL).    Allergies  Allergen Reactions  . Metformin And Related Diarrhea  . Sulfur Hives    Antimicrobials this admission: Cefepime x 1 1/22 Vancomycin 1/22 >> 1/23 Levofloxacin 1/23 >> 1/25 Unasyn 1/25 >> 1/26 Erythromycin 1/28 >> 1/29 PO Vanc 1/29 x 1 Pip/tazo 1/26 >> 1/29, 1/30 >>  Dose adjustments this admission: n/a  Microbiology results: 1/22 BCx: NG x 5 days (2 sets) 1/23 Respiratory panel: negative  1/23 MRSA PCR: negative  Thank you for allowing pharmacy to be a part of this patient's care.  Roanna Banning PharmD Candidate 08/18/2019

## 2019-07-30 NOTE — Progress Notes (Signed)
[facility]  ROUNDING NOTE   Subjective:   Remains on CRRT  UF of 4239m - net even  PRBC transfusion yesterday.   Wbc 42.2 (45.1)  norepi vaso  Objective:  Vital signs in last 24 hours:  Temp:  [97.6 F (36.4 C)-99 F (37.2 C)] 97.6 F (36.4 C) (02/03 0800) Pulse Rate:  [98-122] 110 (02/03 0900) Resp:  [11-27] 19 (02/03 0900) BP: (103-157)/(40-70) 126/53 (02/03 0900) SpO2:  [93 %-100 %] 100 % (02/03 0900) FiO2 (%):  [32 %-35 %] 32 % (02/03 0800) Weight:  [69.6 kg] 69.6 kg (02/03 0435)  Weight change: 1.4 kg Filed Weights   07/28/19 0425 07/29/19 0442 08/14/2019 0435  Weight: 69.3 kg 68.2 kg 69.6 kg    Intake/Output: I/O last 3 completed shifts: In: 4030.1 [I.V.:2441.5; Blood:360; NG/GT:1010; IV Piggyback:218.6] Out: 4426 [Other:4426]   Intake/Output this shift:  Total I/O In: 248.3 [I.V.:118.3; NG/GT:130] Out: 110 [Other:110]  Physical Exam: General: Critically ill   Head: ETT, OGT  Eyes: Anicteric, PERRL  Neck: Supple, trachea midline  Lungs:  PRVC FiO2 32%  Heart: tachycardia  Abdomen:  Soft, nontender,   Extremities:  trace peripheral edema.  Neurologic: Intubated and sedated  Skin: No lesions  Access: Right IJ temp HD 1/25 Dr. KMortimer Fries   Basic Metabolic Panel: Recent Labs  Lab 07/26/19 0418 07/26/19 0547 07/27/19 0035001/31/21 1031 07/28/19 0426 07/28/19 0426 07/28/19 1624 07/28/19 1624 07/29/19 0443 07/29/19 0444 07/29/19 0758 07/29/19 1600 08/21/2019 0447  NA 134*   < > 133*   < > 136  --  136  --  135  --   --  138 137  K 5.4*   < > 4.8   < > 3.6  --  3.6  --  3.7  --   --  2.9* 3.4*  CL 101   < > 97*   < > 100  --  100  --  99  --   --  101 102  CO2 26   < > 26   < > 26  --  28  --  25  --   --  27 28  GLUCOSE 205*   < > 258*   < > 306*   < > 180*  --  394*  --  460* 143* 114*  BUN 33*   < > 39*   < > 45*  --  40*  --  33*  --   --  32* 33*  CREATININE 1.25*   < > 1.24*   < > 1.20*  --  1.03*  --  0.93  --   --  0.83 0.90   CALCIUM 7.6*   < > 7.8*   < > 8.2*   < > 8.7*   < > 8.7*  --   --  9.1 8.0*  MG 2.5*  --  2.2  --  2.2  --   --   --   --  2.1  --   --  2.3  PHOS 3.3   < > 3.6   < > 2.9  --  3.0  --  3.1  --   --  2.5 1.8*   < > = values in this interval not displayed.    Liver Function Tests: Recent Labs  Lab 07/28/19 0426 07/28/19 1624 07/29/19 0443 07/29/19 1600 08/20/2019 0447  ALBUMIN 2.3* 2.2* 2.1* 2.3* 2.0*   No results for input(s): LIPASE, AMYLASE in the last 168 hours.  No results for input(s): AMMONIA in the last 168 hours.  CBC: Recent Labs  Lab 07/26/19 0418 07/27/19 0609 07/28/19 0426 07/29/19 0444 08/21/2019 0447  WBC 34.1* 32.0* 36.4* 45.1* 42.2*  NEUTROABS 28.3*  --   --   --  34.4*  HGB 8.0* 7.2* 7.2* 6.7* 8.4*  HCT 26.1* 24.3* 23.6* 22.1* 26.4*  MCV 97.0 99.6 97.5 102.3* 99.6  PLT 242 125* 103* 91* 48*    Cardiac Enzymes: No results for input(s): CKTOTAL, CKMB, CKMBINDEX, TROPONINI in the last 168 hours.  BNP: Invalid input(s): POCBNP  CBG: Recent Labs  Lab 07/29/19 2345 08/16/2019 0337 08/05/2019 0737 08/20/2019 0812 08/20/2019 0900  GLUCAP 182* 129* 60* 37* 184*    Microbiology: Results for orders placed or performed during the hospital encounter of 07/23/2019  Culture, blood (Routine x 2)     Status: None   Collection Time: 06/29/2019  9:40 PM   Specimen: BLOOD  Result Value Ref Range Status   Specimen Description BLOOD RIGHT ANTECUBITAL  Final   Special Requests   Final    BOTTLES DRAWN AEROBIC AND ANAEROBIC Blood Culture adequate volume   Culture   Final    NO GROWTH 5 DAYS Performed at [facility], [address] [place], [place] [address]    Report Status 07/23/2019 FINAL  Final  Culture, blood (Routine x 2)     Status: None   Collection Time: 06/27/2019  9:40 PM   Specimen: BLOOD  Result Value Ref Range Status   Specimen Description BLOOD LEFT ANTECUBITAL  Final   Special Requests   Final    BOTTLES DRAWN AEROBIC AND ANAEROBIC Blood  Culture adequate volume   Culture   Final    NO GROWTH 5 DAYS Performed at [facility], [address] [place], [place] [address]    Report Status 07/23/2019 FINAL  Final  MRSA PCR Screening     Status: None   Collection Time: 07/19/19  6:45 AM   Specimen: Nasal Mucosa; Nasopharyngeal  Result Value Ref Range Status   MRSA by PCR NEGATIVE NEGATIVE Final    Comment:        The GeneXpert MRSA Assay (FDA approved for NASAL specimens only), is one component of a comprehensive MRSA colonization surveillance program. It is not intended to diagnose MRSA infection nor to guide or monitor treatment for MRSA infections. Performed at [facility], [address] [place], [place] [address]   Respiratory Panel by PCR     Status: None   Collection Time: 07/19/19 10:03 AM   Specimen: Nasopharyngeal Swab; Respiratory  Result Value Ref Range Status   Adenovirus NOT DETECTED NOT DETECTED Final   Coronavirus 229E NOT DETECTED NOT DETECTED Final    Comment: (NOTE) The Coronavirus on the Respiratory Panel, DOES NOT test for the novel  Coronavirus (2019 nCoV)    Coronavirus HKU1 NOT DETECTED NOT DETECTED Final   Coronavirus NL63 NOT DETECTED NOT DETECTED Final   Coronavirus OC43 NOT DETECTED NOT DETECTED Final   Metapneumovirus NOT DETECTED NOT DETECTED Final   Rhinovirus / Enterovirus NOT DETECTED NOT DETECTED Final   Influenza A NOT DETECTED NOT DETECTED Final   Influenza B NOT DETECTED NOT DETECTED Final   Parainfluenza Virus 1 NOT DETECTED NOT DETECTED Final   Parainfluenza Virus 2 NOT DETECTED NOT DETECTED Final   Parainfluenza Virus 3 NOT DETECTED NOT DETECTED Final   Parainfluenza Virus 4 NOT DETECTED NOT DETECTED Final   Respiratory Syncytial Virus NOT DETECTED NOT  DETECTED Final   Bordetella pertussis NOT DETECTED NOT DETECTED Final   Chlamydophila pneumoniae NOT DETECTED NOT DETECTED Final   Mycoplasma pneumoniae NOT DETECTED NOT DETECTED Final    Comment:  Performed at [facility], [place] [address], [place], [place] [address]    Coagulation Studies: No results for input(s): LABPROT, INR in the last 72 hours.  Urinalysis: No results for input(s): COLORURINE, LABSPEC, PHURINE, GLUCOSEU, HGBUR, BILIRUBINUR, KETONESUR, PROTEINUR, UROBILINOGEN, NITRITE, LEUKOCYTESUR in the last 72 hours.  Invalid input(s): APPERANCEUR    Imaging: No results found.   Medications:   .  prismasol BGK 4/2.5 300 mL/hr at 07/29/19 2013  .  prismasol BGK 4/2.5 200 mL/hr at 07/29/19 2013  . sodium chloride 10 mL/hr at 07/28/19 2000  . dexmedetomidine (PRECEDEX) IV infusion 1 mcg/kg/hr (08/07/2019 0900)  . DOBUTamine Stopped (07/28/19 1802)  . feeding supplement (VITAL HIGH PROTEIN) 50 mL/hr at 07/29/19 0700  . fentaNYL infusion INTRAVENOUS 75 mcg/hr (08/22/2019 0900)  . norepinephrine (LEVOPHED) Adult infusion 28 mcg/min (08/17/2019 0900)  . piperacillin-tazobactam Stopped (08/12/2019 0559)  . potassium PHOSPHATE IVPB (in mmol)    . prismasol BGK 2/2.5 dialysis solution 1,500 mL/hr at 07/29/19 1409  . prismasol BGK 4/2.5 1,000 mL/hr at 07/29/19 2013  . vasopressin (PITRESSIN) infusion - *FOR SHOCK* 0.03 Units/min (08/10/2019 0900)   . amiodarone  200 mg Per Tube BID  . atorvastatin  40 mg Per Tube q1800  . B-complex with vitamin C  1 tablet Per Tube Daily  . chlorhexidine gluconate (MEDLINE KIT)  15 mL Mouth Rinse BID  . Chlorhexidine Gluconate Cloth  6 each Topical Daily  . famotidine  20 mg Per Tube QHS  . feeding supplement (PRO-STAT SUGAR FREE 64)  30 mL Per Tube Daily  . insulin aspart  2-6 Units Subcutaneous Q4H  . insulin aspart  3 Units Subcutaneous Q4H  . insulin detemir  8 Units Subcutaneous BID  . ipratropium-albuterol  3 mL Nebulization BID  . mouth rinse  15 mL Mouth Rinse 10 times per day  . midodrine  10 mg Oral TID WC  . multivitamin  15 mL Per Tube Daily  . polyvinyl alcohol  2 drop Both Eyes TID  . senna-docusate  1 tablet Per Tube BID    albuterol, fentaNYL, heparin, midazolam, morphine injection, phenol, sodium chloride flush  Assessment/ Plan:  Ms. KAYLANY TESORIERO is a 74 y.o. white female with diabetes mellitus type II, COPD, systolic and diastolic congestive heart failure, hypertension, coronary artery disease on CABG, hyperlipidemia, who is admitted to [facility] on 06/29/2019 for Acute on chronic respiratory failure with hypoxemia ([place]) [J96.21] Sepsis due to pneumonia ([place]) [J18.9, A41.9] Multifocal pneumonia [J18.9]  1. Acute renal failure requiring renal replacement therapy on chronic kidney disease stage IIIB with proteinuria: baseline creatinine 1.5, GFR of 34.   Chronic Kidney Disease secondary to diabetes and hypertension Acute renal failure secondary to ATN from sepsis and pneumonia Anuric urine output - Continue CRRT: CVVHDF. -  ultrafiltration even   2. Anemia with renal failure: with thrombocytopenia. Transfusion yesterday 2/2  3. Acute respiratory failure requiring mechanical ventilation: with COPD with chronic oxygen requirements. Acute exacerbation of COPD.   4. Sepsis with shock: secondary to pneumonia. COVID-19 negative.  Requiring multiple vasopressors.  - empiric pip/tazo.   Overall prognosis remains critical.  Appreciate palliative care.    LOS: 12 Semone Orlov 2/3/20219:28 AM

## 2019-07-30 NOTE — Progress Notes (Signed)
Inpatient Diabetes Program Recommendations  AACE/ADA: New Consensus Statement on Inpatient Glycemic Control (2015)  Target Ranges:  Prepandial:   less than 140 mg/dL      Peak postprandial:   less than 180 mg/dL (1-2 hours)      Critically ill patients:  140 - 180 mg/dL   Lab Results  Component Value Date   GLUCAP 184 (H) 08/23/2019   HGBA1C 7.9 (H) 06/28/2019    Review of Glycemic Control  Diabetes history: DM 2 Outpatient Diabetes medications: Glucotrol 10 mg bid + Januvia 50 mg qd  Current orders for Inpatient glycemic control:  Levemir 8 units bid ICU Novolog Resistant 2-6 units Q4 hours Novolog 3 units Q4 hours Tube Feed Coverage  Vital HP 50 ml/hour (on hold) BUN/Creat: 33/0.90  Inpatient Diabetes Program Recommendations:    Noted Tube Feed coverage given overnight even though pt not receiving tube feeds. Hypoglycemia this am. Spoke with RN to discuss. May need to d/c tube feed coverage to assure it will not be given.  Thanks,  Tama Headings RN, MSN, BC-ADM Inpatient Diabetes Coordinator Team Pager [phone] (8a-5p)

## 2019-07-30 NOTE — Progress Notes (Signed)
   08/14/2019 1600  Clinical Encounter Type  Visited With Patient and family together  Visit Type Follow-up  Referral From Chaplain  Consult/Referral To Chaplain  While doing rounds, Chaplain visited with patient and three daughter. While there together Chaplain and daughter,  Andrea [name] put prayer shaw on patient. Chaplain offered pastoral presence, empathy, and prayer.

## 2019-07-30 NOTE — Progress Notes (Signed)
Palliative:  HPI: 74 HPI:74 y.o.femalewith past medical history of COPD, CHF, atrial fibrillation, CAD s/p CABG 2020, h/o MI, h/o PE, hypertension, CKD stage 3, diabetes, breast cancer in remissionadmitted on 1/22/2021with shortness of breath.Hospitalization 3 times over Nov-Dec 2020 with COVID pneumonia. Reportedly had steady improvement until ~3 days prior to this admission when she became more short of breath and with dry cough and diagnosed with multifocal pneumonia. Acute decompensation and required intubation 1/23 and was extubated 1/24 but now with increased work of breathing and requiring BiPAP. Continues to be high risk for re-intubation with COVID pneumonitis and severe COPD exacerbation.07/21/19 Re-intubated after my discussion and visiton1/26/21 on CRRT and ventsupport as well as requiring vasopressor support and amiodarone infusion.2/2 She continues on vent support and increasing need for vasopressor support; dialysis limited by hypotension.    I met today at Ms. Dirks's bedside after discussion with Dr. Patsey Berthold and RN Malachy Mood. Andrea [name] continues to decline as she is less responsive on same amount of sedation, fingers/toes more cyanotic, continues on high dose of vasopressors, and dialysis continues to be limited by hypotension, no urine output.   Spoke privately with all 3 daughters outside room. We spoke about the continued decline. Concern that she is at end of life. They have decided DNR already and this was placed. We discussed the next step is to consider one way extubation. They are very clear that they want her to be comfortable and do not want her to suffer. We discussed symptom management during one way extubation. They really want to take her home with hospice but I do not feel this will be possible given the level of support that she was requiring. They are also concerned about protecting her dignity and privacy.   They would like to discuss with the 2 adult  grandchildren that are very close and very involved in Ms. Schow's care this evening. We will meet together again tomorrow to discuss further as all 3 daughters plan to return to beside tomorrow.   All questions/concerns addressed to best of my ability. Emotional support provided.   Exam: Less responsive today. No distress. Dyssynchronous and sedated on vent. Vasopressors and CRRT going. Abd soft. Generalized edema. Fingers and toes cool and cyanotic.   Plan: - Family discussing and making plans for one way extubation.   52 min  Vinie Sill, NP Palliative Medicine Team Pager [phone] (Please see [email or web address] for schedule) Team Phone [phone]    Greater than 50%  of this time was spent counseling and coordinating care related to the above assessment and plan

## 2019-07-30 NOTE — Plan of Care (Addendum)
Pt's family at bedside this afternoon, spoke w/Pall NP, CRRT running w/o issues, sedation off at this time, pt unresponsive, sacral area exhibits fairly extensive DPI and small areas Stage II breakdown w/ scant bleeding.  Pressors remain, 0 UOP, foley DCd per protocol and purewick in place.  Family educated re POC.  They do not wish to extubate at this time.  Update 1810: Pt w/eyes open, tracking her daughters, unable to follow commands but seems to be aware they are in the room

## 2019-07-30 NOTE — Consult Note (Signed)
PHARMACY CONSULT NOTE - FOLLOW UP  Pharmacy Consult for Electrolyte Monitoring and Replacement   Andrea [name] is a 74 y.o. female admitted on 07/05/2019 with acute on chronic renal failure with hyperkalemia, elevated troponin, and acute on chronic hypoxic hypercapnic respiratory failure secondary to bacterial pneumonia vs COVID-19 pneumonia. PMH includes MI, Afib, HTN, CABG and MRA of 2020, T2DM, COPD on chronic home oxygen, chronic diastolic heart failure, CKD Stage III, COVID-19 (tested positive 04/2019). Per am rounds, feeding supplement and insulin coverage used for feeding supplement have been D/C'ed. Pt is intubated on fentanyl IV. Pt is also on CRRT.   Recent Labs: Potassium (mmol/L)  Date Value  08/22/2019 3.4 (L)   Magnesium (mg/dL)  Date Value  08/18/2019 2.3   Calcium (mg/dL)  Date Value  07/31/2019 8.0 (L)   Albumin (g/dL)  Date Value  08/10/2019 2.0 (L)   Phosphorus (mg/dL)  Date Value  07/28/2019 1.8 (L)   Sodium (mmol/L)  Date Value  08/18/2019 137     Assessment: 1. Electrolytes: Electrolytes WNL, except for potassium and phosphorus. Pt will receive 15 mmol x 1 dose of potassium phosphate today (will receive 21 mEq potassium). Corrected calcium level is 9.6 (WNL). Will defer electrolyte monitoring to nephrology. Continue to monitor electrolytes in am labs. Will replace to achieve goals for potassium ~4, magnesium ~2, and sodium ~140.      2. Glucose: Today's range: 37-184. Glucose levels are lower compared to yesterday. Pt is on insulin aspart 2-6 units [place] Q4H and insulin detemir 8 units [place] BID. Continue insulin administrations and monitor glucose levels daily.   3. Constipation: Last BM: 02/02. Continue Senokot-S 1 tab per tube BID PRN while pt is on continuous fentanyl IV.    Roanna Banning ,PharmD Candidate Clinical Pharmacist 08/15/2019 3:26 PM

## 2019-07-31 ENCOUNTER — Inpatient Hospital Stay: Payer: Medicare Other

## 2019-07-31 DIAGNOSIS — J9600 Acute respiratory failure, unspecified whether with hypoxia or hypercapnia: Secondary | ICD-10-CM

## 2019-07-31 LAB — CBC
HCT: 26.9 % — ABNORMAL LOW (ref 36.0–46.0)
Hemoglobin: 8.5 g/dL — ABNORMAL LOW (ref 12.0–15.0)
MCH: 32.2 pg (ref 26.0–34.0)
MCHC: 31.6 g/dL (ref 30.0–36.0)
MCV: 101.9 fL — ABNORMAL HIGH (ref 80.0–100.0)
Platelets: 39 10*3/uL — ABNORMAL LOW (ref 150–400)
RBC: 2.64 MIL/uL — ABNORMAL LOW (ref 3.87–5.11)
RDW: 23.9 % — ABNORMAL HIGH (ref 11.5–15.5)
WBC: 44 10*3/uL — ABNORMAL HIGH (ref 4.0–10.5)
nRBC: 12.6 % — ABNORMAL HIGH (ref 0.0–0.2)

## 2019-07-31 LAB — RENAL FUNCTION PANEL
Albumin: 2.1 g/dL — ABNORMAL LOW (ref 3.5–5.0)
Albumin: 2 g/dL — ABNORMAL LOW (ref 3.5–5.0)
Anion gap: 8 (ref 5–15)
Anion gap: 11 (ref 5–15)
BUN: 24 mg/dL — ABNORMAL HIGH (ref 8–23)
BUN: 24 mg/dL — ABNORMAL HIGH (ref 8–23)
CO2: 27 mmol/L (ref 22–32)
CO2: 22 mmol/L (ref 22–32)
Calcium: 7.9 mg/dL — ABNORMAL LOW (ref 8.9–10.3)
Calcium: 7.7 mg/dL — ABNORMAL LOW (ref 8.9–10.3)
Chloride: 102 mmol/L (ref 98–111)
Chloride: 104 mmol/L (ref 98–111)
Creatinine, Ser: 0.88 mg/dL (ref 0.44–1.00)
Creatinine, Ser: 0.95 mg/dL (ref 0.44–1.00)
GFR calc Af Amer: >60 mL/min (ref >60)
GFR calc Af Amer: >60 mL/min (ref >60)
GFR calc non Af Amer: >60 mL/min (ref >60)
GFR calc non Af Amer: 60 mL/min — ABNORMAL LOW (ref >60)
Glucose, Bld: 112 mg/dL — ABNORMAL HIGH (ref 70–99)
Glucose, Bld: 126 mg/dL — ABNORMAL HIGH (ref 70–99)
Phosphorus: 3 mg/dL (ref 2.5–4.6)
Phosphorus: 2.6 mg/dL (ref 2.5–4.6)
Potassium: 4.2 mmol/L (ref 3.5–5.1)
Potassium: 4.4 mmol/L (ref 3.5–5.1)
Sodium: 137 mmol/L (ref 135–145)
Sodium: 137 mmol/L (ref 135–145)

## 2019-07-31 LAB — GLUCOSE, CAPILLARY
Glucose-Capillary: 104 mg/dL — ABNORMAL HIGH (ref 70–99)
Glucose-Capillary: 106 mg/dL — ABNORMAL HIGH (ref 70–99)
Glucose-Capillary: 140 mg/dL — ABNORMAL HIGH (ref 70–99)
Glucose-Capillary: 299 mg/dL — ABNORMAL HIGH (ref 70–99)
Glucose-Capillary: 59 mg/dL — ABNORMAL LOW (ref 70–99)
Glucose-Capillary: 66 mg/dL — ABNORMAL LOW (ref 70–99)
Glucose-Capillary: 69 mg/dL — ABNORMAL LOW (ref 70–99)
Glucose-Capillary: 80 mg/dL (ref 70–99)
Glucose-Capillary: 92 mg/dL (ref 70–99)

## 2019-07-31 LAB — PATHOLOGIST SMEAR REVIEW

## 2019-07-31 LAB — MAGNESIUM: Magnesium: 2.4 mg/dL (ref 1.7–2.4)

## 2019-07-31 MED ORDER — CHLORHEXIDINE GLUCONATE 0.12 % MT SOLN
OROMUCOSAL | Status: AC
  Administered 2019-07-31: 15 mL via OROMUCOSAL
  Filled 2019-07-31: qty 15

## 2019-07-31 MED ORDER — FENTANYL CITRATE (PF) 100 MCG/2ML IJ SOLN
INTRAMUSCULAR | Status: AC
  Filled 2019-07-31: qty 2

## 2019-07-31 MED ORDER — ROCURONIUM BROMIDE 50 MG/5ML IV SOLN
INTRAVENOUS | Status: AC
  Filled 2019-07-31: qty 1

## 2019-07-31 MED ORDER — DEXTROSE 50 % IV SOLN
INTRAVENOUS | Status: AC
  Administered 2019-07-31: 09:00:00 50 mL
  Filled 2019-07-31: qty 50

## 2019-07-31 MED ORDER — LORAZEPAM 2 MG/ML IJ SOLN: 1.0000 mg | INTRAMUSCULAR | 0 refills | Status: AC | PRN

## 2019-07-31 MED ORDER — HYDROMORPHONE HCL PF 1 MG/ML IJ SOLN: 1.0000 mg | INTRAMUSCULAR | 0 refills | Status: AC | PRN

## 2019-07-31 MED ORDER — DEXTROSE 50 % IV SOLN
25.0000 mL | Freq: Once | INTRAVENOUS | Status: AC
  Administered 2019-07-31: 25 mL via INTRAVENOUS

## 2019-07-31 MED ORDER — ETOMIDATE 2 MG/ML IV SOLN
INTRAVENOUS | Status: AC
  Filled 2019-07-31: qty 10

## 2019-07-31 NOTE — Consult Note (Signed)
Pharmacy Antibiotic Note  Andrea [name] is a 74 y.o. female with medical history including MI, CAD s/p CABG, T2DM, CHF, CKD stage III, and COVID 19 (04/2019) admitted on 07/17/2019 with pneumonia. Patient is critically ill in the ICU on the ventilator for acute respiratory failure and with AKI requiring CRRT. Pharmacy has been consulted for pip/tazo dosing.  WBC trending up to 44 today. Procalcitonin as of 01/30 is 0.35. Patient is afebrile.   Plan: pip/tazo 3.375 g q6h (CRRT dosing)  Continue to follow antibiotic plan. Adjust dosing as indicated for RRT / renal function. Will discuss tomorrow in am rounds to continue or discontinue antibiotic therapy.   Antibiotic Day So Far for Pneumonia Therapy: 11 days Total antibiotic therapy: 14 days  Height: 5\' 2"  (157.5 cm) Weight: 152 lb 5.4 oz (69.1 kg) IBW/kg (Calculated) : 50.1  Temp (24hrs), Avg:98.2 F (36.8 C), Min:97.8 F (36.6 C), Max:98.6 F (37 C)  Recent Labs  Lab 07/27/19 0609 07/27/19 1031 07/28/19 0426 07/28/19 1624 07/29/19 0443 07/29/19 0444 07/29/19 1600 08/11/2019 0447 08/24/2019 1649 07/31/19 0434 07/31/19 0436  WBC 32.0*  --  36.4*  --   --  45.1*  --  42.2*  --   --  44.0*  CREATININE 1.24*   < > 1.20*   < > 0.93  --  0.83 0.90 0.83 0.88  --    < > = values in this interval not displayed.    Estimated Creatinine Clearance: 52.6 mL/min (by C-G formula based on SCr of 0.88 mg/dL).    Allergies  Allergen Reactions  . Metformin And Related Diarrhea  . Sulfur Hives    Antimicrobials this admission: Cefepime x 1 1/22 Vancomycin 1/22 >> 1/23 Levofloxacin 1/23 >> 1/25 Unasyn 1/25 >> 1/26 Erythromycin 1/28 >> 1/29 PO Vanc 1/29 x 1 Pip/tazo 1/26 >> 1/29, 1/30 >>  Dose adjustments this admission: n/a  Microbiology results: 1/22 BCx: NG x 5 days (2 sets) 1/23 Respiratory panel: negative  1/23 MRSA PCR: negative  Thank you for allowing pharmacy to be a part of this patient's care.  Roanna Banning PharmD  Candidate 07/31/2019

## 2019-07-31 NOTE — Progress Notes (Addendum)
CH briefly discussed pt.'s case w/palliative care NP @ rounds this AM.  Chaplains will work to support family as they make decisions re: pt.'s plan of care.  CH encountered pt.'s dtrs. Andrea [name] and Andrea [name]) in [employer].  Andrea [name] shared medical team had done trial weaning of ventilator and that pt. 'did OK but after a half hour she got tired.'  Appears to be assessing whether comfort care would cause suffering for pt.  Family is still in process of deciding how to proceed w/pt.'s care.  Andrea [name]'s two dtrs Andrea [name] and Andrea [name]) are [facility]; family is hopeful that Andrea [name] might be able to visit briefly tomorrow (is an Therapist, sports); unsure whether policy would allow younger granddtr Andrea [name] to visit or whether this would be traumatizing; [place] offered to inquire w/palliative care NP and unit RN about possible adjustments to visitor list; family understands this may not be possible.  Andrea [name] tearful in describing Andrea [name]'s desire to be @ pt.'s bedside.  [place] will follow up later today if possible and coordinate w/other following chaplains.  4:30pm: CH encountered family (three dtrs, grandson Andrea [name]) in ICU waiting rm., awaiting pt.'s medical equipment being cleaned; family shared plan is to discharge pt. to go to dtr. Andrea [name]'s house; family will not tell pt. this is hospice/EOL situation --> pt. is 'terrified of dying' dtrs. shared.  Family shared feeling a hope that pt. might get better, keeping w/experience of other family members experiencing remarkable and unexpected recoveries; family requested prayer that pt. would make it home safely.  [place] provided prayer and will make other unit chaplains aware of situation.       07/31/19 1340  Clinical Encounter Type  Visited With Family  Visit Type Follow-up;Psychological support;Social support  Spiritual Encounters  Spiritual Needs Emotional;Grief support  Stress Factors  Family Stress Factors Loss of control;Major life changes;Health changes;Family relationships

## 2019-07-31 NOTE — Plan of Care (Signed)
Family at bedside, pt on SBT, tolerating fairly but RR elevated in 30s and WOB gradually increasing, CRRT pressures also gradually increasing, family aware filter change may be necessary this shift.  Family continues to speak to patient about "when you come home...".  At this point they do not appear to intend to transition to comfort care. Pt with eyes open, sedation off.  She nods/shakes head appropriately. Anuria continues

## 2019-07-31 NOTE — Progress Notes (Signed)
Approx 35 minutes SBT, 10/5, 32% FIO2, family at bedside.  O2 sats WDL, but WOB increased, breathing agonal, RR 35+.  PRVC restarted, sedation remains off, pt appears comfortable, denies pain.  VS improved

## 2019-07-31 NOTE — Progress Notes (Addendum)
Palliative:  HPI: 73 HPI:73 y.o.femalewith past medical history of COPD, CHF, atrial fibrillation, CAD s/p CABG 2020, h/o MI, h/o PE, hypertension, CKD stage 3, diabetes, breast cancer in remissionadmitted on 01/09/2021with shortness of breath.Hospitalization 3 times over Nov-Dec 2020 with COVID pneumonia. Reportedly had steady improvement until ~3 days prior to this admission when she became more short of breath and with dry cough and diagnosed with multifocal pneumonia. Acute decompensation and required intubation 1/23 and was extubated 1/24 but now with increased work of breathing and requiring BiPAP. Continues to be high risk for re-intubation with COVID pneumonitis and severe COPD exacerbation.07/21/19 Re-intubated after my discussion and visiton1/26/21 on CRRT and ventsupport as well as requiring vasopressor support and amiodarone infusion.2/2 She continues on vent support and increasing need for vasopressor support; dialysis limited by hypotension.    I met today at Andrea [name]'s bedside during weaning trial. She was able to tolerate 35 minutes but was also having accessory muscle use and tachypnea during this period. I will note that she had no sedation during this time as well. She is also able to communicate with her family via head nod and attempts at reading her lips have been difficult but she is trying to communicate.   I met with all three daughters and grandson regarding plans moving forward. Andrea [name] has indicated to them that she wants to return home (Andrea [name]'s home). Family would like to respect this wish and make this happen. We discussed all scenarios and the likelihood that she could very well die in transport. They understand that this is a risk and are accepting of this risk in order to follow her wishes. Unfortunately no family will be allowed to ride home with her and I encouraged them to maybe be over cell phone so that they can speak and reassure her for the ride home. They  would like to follow transport. Unfortunately, I am told that if she dies in transport she will be brought back to the hospital and not allowed to stay at home for her family to visit with her for closure. Will see if we can allow a space at the hospital if this happens.   I have also coordinated with hospice [facility] per family request to arrange for IV medications to be available that family can administer via her midline IV site. They have multiple RNs in family that feel comfortable administering IV medication. Also hoping that transport will be able to transport her on BiPAP to give her best chance of surviving to get home.   I will follow up in the morning. All questions/concerns addressed. Emotional support provided. Updated Dr. Kasa.   Exam: Alert (when off sedation) and able to communicate via head nod. HR tachycardic. Breathing dyssynchronous on vent. Abd soft. Bilat fingers/toes cyanotic.   Plan: - Coordinating with EMS and hospice for transport home tomorrow. Will one way extubate when transport arrives so she can go straight home. Continue current care at this time.  - Prescriptions for IV dilaudid and ativan provided to hospice in preparation for home use.   1230-1300, 1630-1720 80 min   , NP Palliative Medicine Team Pager [phone] (Please see [email or web address] for schedule) Team Phone [phone]    Greater than 50%  of this time was spent counseling and coordinating care related to the above assessment and plan  

## 2019-07-31 NOTE — Progress Notes (Signed)
[facility]  ROUNDING NOTE   Subjective:   Remains on CRRT   Family trying to decide on goals of care.   Objective:  Vital signs in last 24 hours:  Temp:  [97.8 F (36.6 C)-98.6 F (37 C)] 97.8 F (36.6 C) (02/04 1200) Pulse Rate:  [101-122] 115 (02/04 1500) Resp:  [9-32] 19 (02/04 1500) BP: (99-133)/(37-62) 128/53 (02/04 1500) SpO2:  [94 %-100 %] 100 % (02/04 1500) FiO2 (%):  [32 %] 32 % (02/04 1530) Weight:  [69.1 kg] 69.1 kg (02/04 0424)  Weight change: -0.5 kg Filed Weights   07/29/19 0442 08/22/2019 0435 07/31/19 0424  Weight: 68.2 kg 69.6 kg 69.1 kg    Intake/Output: I/O last 3 completed shifts: In: 2919 [I.V.:2010.4; NG/GT:350; IV Piggyback:558.6] Out: 2918.6 [Emesis/NG output:250; Other:2668.6]   Intake/Output this shift:  Total I/O In: 576 [I.V.:406; NG/GT:120; IV Piggyback:49.9] Out: 569.8 [Other:569.8]  Physical Exam: General: Critically ill   Head: ETT, OGT  Eyes: Anicteric, PERRL  Neck: Supple, trachea midline  Lungs:  PRVC FiO2 32%  Heart: tachycardia  Abdomen:  Soft, nontender,   Extremities:  trace peripheral edema.  Neurologic: Intubated and sedated  Skin: No lesions  Access: Right IJ temp HD 1/25 Dr. Mortimer Fries    Basic Metabolic Panel: Recent Labs  Lab 07/27/19 5176 07/27/19 1031 07/28/19 0426 07/28/19 1624 07/29/19 0443 07/29/19 0443 07/29/19 0444 07/29/19 0758 07/29/19 1600 07/29/19 1600 08/13/2019 0447 08/11/2019 1649 07/31/19 0434 07/31/19 0436  NA 133*   < > 136   < > 135  --   --   --  138  --  137 136 137  --   K 4.8   < > 3.6   < > 3.7  --   --   --  2.9*  --  3.4* 4.0 4.2  --   CL 97*   < > 100   < > 99  --   --   --  101  --  102 102 102  --   CO2 26   < > 26   < > 25  --   --   --  27  --  28 29 27   --   GLUCOSE 258*   < > 306*   < > 394*   < >  --  460* 143*  --  114* 177* 112*  --   BUN 39*   < > 45*   < > 33*  --   --   --  32*  --  33* 28* 24*  --   CREATININE 1.24*   < > 1.20*   < > 0.93  --   --   --   0.83  --  0.90 0.83 0.88  --   CALCIUM 7.8*   < > 8.2*   < > 8.7*   < >  --   --  9.1   < > 8.0* 7.9* 7.9*  --   MG 2.2  --  2.2  --   --   --  2.1  --   --   --  2.3  --   --  2.4  PHOS 3.6   < > 2.9   < > 3.1  --   --   --  2.5  --  1.8* 3.2 3.0  --    < > = values in this interval not displayed.    Liver Function Tests: Recent Labs  Lab 07/29/19 0443 07/29/19 1600  08/03/2019 0447 08/13/2019 1649 07/31/19 0434  ALBUMIN 2.1* 2.3* 2.0* 2.0* 2.1*   No results for input(s): LIPASE, AMYLASE in the last 168 hours. No results for input(s): AMMONIA in the last 168 hours.  CBC: Recent Labs  Lab 07/26/19 0418 07/26/19 0418 07/27/19 0609 07/28/19 0426 07/29/19 0444 08/23/2019 0447 07/31/19 0436  WBC 34.1*   < > 32.0* 36.4* 45.1* 42.2* 44.0*  NEUTROABS 28.3*  --   --   --   --  34.4*  --   HGB 8.0*   < > 7.2* 7.2* 6.7* 8.4* 8.5*  HCT 26.1*   < > 24.3* 23.6* 22.1* 26.4* 26.9*  MCV 97.0   < > 99.6 97.5 102.3* 99.6 101.9*  PLT 242   < > 125* 103* 91* 48* 39*   < > = values in this interval not displayed.    Cardiac Enzymes: No results for input(s): CKTOTAL, CKMB, CKMBINDEX, TROPONINI in the last 168 hours.  BNP: Invalid input(s): POCBNP  CBG: Recent Labs  Lab 07/31/19 0020 07/31/19 0407 07/31/19 0811 07/31/19 1050 07/31/19 1620  GLUCAP 92 104* 65* 140* 106*    Microbiology: Results for orders placed or performed during the hospital encounter of 06/30/2019  Culture, blood (Routine x 2)     Status: None   Collection Time: 07/27/2019  9:40 PM   Specimen: BLOOD  Result Value Ref Range Status   Specimen Description BLOOD RIGHT ANTECUBITAL  Final   Special Requests   Final    BOTTLES DRAWN AEROBIC AND ANAEROBIC Blood Culture adequate volume   Culture   Final    NO GROWTH 5 DAYS Performed at [facility], [address] [place], [place] [address]    Report Status 07/23/2019 FINAL  Final  Culture, blood (Routine x 2)     Status: None   Collection Time: 07/13/2019   9:40 PM   Specimen: BLOOD  Result Value Ref Range Status   Specimen Description BLOOD LEFT ANTECUBITAL  Final   Special Requests   Final    BOTTLES DRAWN AEROBIC AND ANAEROBIC Blood Culture adequate volume   Culture   Final    NO GROWTH 5 DAYS Performed at [facility], [address] [place], [place] [address]    Report Status 07/23/2019 FINAL  Final  MRSA PCR Screening     Status: None   Collection Time: 07/19/19  6:45 AM   Specimen: Nasal Mucosa; Nasopharyngeal  Result Value Ref Range Status   MRSA by PCR NEGATIVE NEGATIVE Final    Comment:        The GeneXpert MRSA Assay (FDA approved for NASAL specimens only), is one component of a comprehensive MRSA colonization surveillance program. It is not intended to diagnose MRSA infection nor to guide or monitor treatment for MRSA infections. Performed at [facility], [address] [place], [place] [address]   Respiratory Panel by PCR     Status: None   Collection Time: 07/19/19 10:03 AM   Specimen: Nasopharyngeal Swab; Respiratory  Result Value Ref Range Status   Adenovirus NOT DETECTED NOT DETECTED Final   Coronavirus 229E NOT DETECTED NOT DETECTED Final    Comment: (NOTE) The Coronavirus on the Respiratory Panel, DOES NOT test for the novel  Coronavirus (2019 nCoV)    Coronavirus HKU1 NOT DETECTED NOT DETECTED Final   Coronavirus NL63 NOT DETECTED NOT DETECTED Final   Coronavirus OC43 NOT DETECTED NOT DETECTED Final   Metapneumovirus NOT DETECTED NOT DETECTED Final   Rhinovirus /  Enterovirus NOT DETECTED NOT DETECTED Final   Influenza A NOT DETECTED NOT DETECTED Final   Influenza B NOT DETECTED NOT DETECTED Final   Parainfluenza Virus 1 NOT DETECTED NOT DETECTED Final   Parainfluenza Virus 2 NOT DETECTED NOT DETECTED Final   Parainfluenza Virus 3 NOT DETECTED NOT DETECTED Final   Parainfluenza Virus 4 NOT DETECTED NOT DETECTED Final   Respiratory Syncytial Virus NOT DETECTED NOT DETECTED Final    Bordetella pertussis NOT DETECTED NOT DETECTED Final   Chlamydophila pneumoniae NOT DETECTED NOT DETECTED Final   Mycoplasma pneumoniae NOT DETECTED NOT DETECTED Final    Comment: Performed at [facility], [place] [address] [place], [place] [address]    Coagulation Studies: No results for input(s): LABPROT, INR in the last 72 hours.  Urinalysis: No results for input(s): COLORURINE, LABSPEC, PHURINE, GLUCOSEU, HGBUR, BILIRUBINUR, KETONESUR, PROTEINUR, UROBILINOGEN, NITRITE, LEUKOCYTESUR in the last 72 hours.  Invalid input(s): APPERANCEUR    Imaging: DG Chest Port 1 View  Result Date: 07/31/2019 CLINICAL DATA:  Check endotracheal tube placement EXAM: PORTABLE CHEST 1 VIEW COMPARISON:  07/26/2018 FINDINGS: Cardiac shadow is stable. Postsurgical changes are noted. Endotracheal tube, gastric catheter and right jugular temporary dialysis catheter are noted. Lungs are well aerated with bilateral airspace opacities stable in appearance from the prior exam. Left subclavian arterial stent is noted. IMPRESSION: Tubes and lines as described above. Stable bilateral opacities. Electronically Signed   By: Inez Catalina M.D.   On: 07/31/2019 03:35     Medications:   .  prismasol BGK 4/2.5 300 mL/hr at 07/31/19 0601  .  prismasol BGK 4/2.5 200 mL/hr at 08/21/2019 2127  . sodium chloride 10 mL/hr at 07/28/19 2000  . dexmedetomidine (PRECEDEX) IV infusion 0.4 mcg/kg/hr (07/31/19 1500)  . DOBUTamine Stopped (07/28/19 1802)  . fentaNYL infusion INTRAVENOUS 50 mcg/hr (07/31/19 1500)  . norepinephrine (LEVOPHED) Adult infusion 30 mcg/min (07/31/19 1500)  . piperacillin-tazobactam Stopped (07/31/19 1204)  . prismasol BGK 2/2.5 dialysis solution 1,500 mL/hr at 07/29/19 1409  . prismasol BGK 4/2.5 1,000 mL/hr at 07/31/19 1245  . vasopressin (PITRESSIN) infusion - *FOR SHOCK* 0.03 Units/min (07/31/19 1500)   . amiodarone  200 mg Per Tube BID  . atorvastatin  40 mg Per Tube q1800  . chlorhexidine  gluconate (MEDLINE KIT)  15 mL Mouth Rinse BID  . Chlorhexidine Gluconate Cloth  6 each Topical Daily  . famotidine  20 mg Per Tube QHS  . insulin aspart  2-6 Units Subcutaneous Q4H  . ipratropium-albuterol  3 mL Nebulization BID  . mouth rinse  15 mL Mouth Rinse 10 times per day  . midodrine  10 mg Oral TID WC  . polyvinyl alcohol  2 drop Both Eyes TID   albuterol, fentaNYL, heparin, midazolam, morphine injection, phenol, senna-docusate, sodium chloride flush  Assessment/ Plan:  Andrea [name] is a 74 y.o. white female with diabetes mellitus type II, COPD, systolic and diastolic congestive heart failure, hypertension, coronary artery disease on CABG, hyperlipidemia, who is admitted to [facility] on 07/04/2019 for Acute on chronic respiratory failure with hypoxemia ([place]) [J96.21] Sepsis due to pneumonia ([place]) [J18.9, A41.9] Multifocal pneumonia [J18.9]  1. Acute renal failure requiring renal replacement therapy on chronic kidney disease stage IIIB with proteinuria: baseline creatinine 1.5, GFR of 34.   Chronic Kidney Disease secondary to diabetes and hypertension Acute renal failure secondary to ATN from sepsis and pneumonia Anuric urine output - Continue CRRT: CVVHDF. -  ultrafiltration even   2. Anemia with renal  failure: with thrombocytopenia. Transfusion 2/2  3. Acute respiratory failure requiring mechanical ventilation: with COPD with chronic oxygen requirements. Acute exacerbation of COPD.   4. Sepsis with shock: secondary to pneumonia. COVID-19 negative.  Requiring multiple vasopressors.  - empiric pip/tazo.   Overall prognosis remains critical.  Appreciate palliative care.    LOS: [place] 2/4/20214:58 PM

## 2019-07-31 NOTE — Progress Notes (Signed)
New referral for [facility] hospice services at home received from Palliative NP Vinie Sill NP, [facility] [place] [facility] aware. Patient information given to referral. Writer met in the ICU waiting room with patient's daughter's [name], Lattie Haw and Leana Roe to initiate education regarding hospice services, philosophy and team approach to care with understanding voiced. Plan is for discharge home via EMS tomorrow post extubation. Family aware that patient may not survive transport home. PLEASE NOTE patient will be discharging to:  [place] Dr. Tyler Deis [place] Signed out of facility DNR in place in the shadow chart. Discharge prescriptions discussed and Probation officer by  Palliative NP Vinie Sill and faxed to hospice pharmacy to be filled and will be brought to the home by the hospice admission RN. TOC [place] [facility] updated. Will continue to follow thorough discharge. Thank you for the opportunity to be involved in the care of this patient and her family. Flo Shanks BSN, RN, [place] [phone]

## 2019-07-31 NOTE — Progress Notes (Signed)
CRRT paused at 15:35 for cartridge change d/t rapidly increasing pressures.  First attempt unsuccessful d/t air drawn into system from empty priming bag.  Second change successful with auto effluent bag change performed as well. CRRT restarted at 1740.  PFR rate increased to compensate for continuous infusions running while machine was paused.  Family at bedside.  Daughter Lattie Haw will spend the night, they are very fearful patient will die alone tonight.  Plan is to extubate her  tomorrow and send her home with Hospice Care.  Patient's belongings, including her dentures, eyeglasses, and purse were sent home with her family so they will not have to worry about that tomorrow.

## 2019-07-31 NOTE — Progress Notes (Signed)
CRITICAL CARE NOTE BRIEF PATIENT DESCRIPTION:  74 yo female dx with COVID-19 04/2019 admitted with acute on chronic renal failure with hyperkalemia, elevated troponin, and acute on chronic hypoxic hypercapnic respiratory failure secondary to bacterial pneumonia vs. COVID-19 pneumonitis requiring mechanical intubation   SYNOPSIS This is a 74 yo female with a PMH of HTN, MI, CABG (07/2018)Type II Diabetes Mellitus, COPD, Chronic Home O2 _0 , Chronic Atrial Fibrillation (on eliquis), Chronic Diastolic CHF (EF 40 to 33% via Echo 05/2019), CKD Stage III (baseline creatinine 1.6-1.9), and COVID-19 (dx 05/15/2019). She presented to [facility] ER via EMS on 01/22 with worsening shortness of breath, cough, and fatigue. At baseline pt wears chronic home O2 _1 , however due to symptoms she has had to increase her O2 to 4L 24hrs prior to current ER presentation. En route to the ER EMS placed pt on 6L O2 and administered albuterol. Upon arrival to the ER pt tachypneic with increased work of breathing requiring iv steroids, duonebs, and HFNC. Lab results revealed Na+ 133, CO2 21, BUN 38, creatinine 1.78, glucose 300, anion gap 17, troponin 90, lactic acid 6.2, pct 1.11, wbc 13.7, hgb 8.4, and vbg pH 7.38/pCO2 42. CXR concerning for multifocal pneumonia and small bilateral pleural effusions. She received solumedrol, vancomycin, and cefepime. She was subsequently admitted to the stepdown unit per hospitalist team for additional workup and treatment, however remained in the ER pending bed availability. On 01/23 pt developed worsening acute hypoxic respiratory failure possibly due to flash pulmonary edema after receiving a total of 3L of fluids requiring mechanical ventilation. PCCM contacted to assume care.  Pt does have a hx of COVID-19 diagnosed 05/14/2020, which required hospitalization/treatment at [facility]. Pt discharged home on 05/20/2019. However, she required hospitalization again at [facility] on 05/31/2019  due to multifocal pneumonia, following treatment she was discharged home on 06/29/2019.   CC  follow up respiratory failure  SUBJECTIVE Patient remains critically ill, sedated on the ventilator Continues to be dependent on pressors Continues to be dependent on the ventilator Acute multiorgan failure on chronic severe multiorgan dysfunction  BP (!) 124/59   Pulse (!) 116   Temp 97.8 F (36.6 C) (Axillary)   Resp (!) 22   Ht _2  (1.575 m)   Wt 69.1 kg   SpO2 97%   BMI 27.86 kg/m    I/O last 3 completed shifts: In: 2919 [I.V.:2010.4; NG/GT:350; IV Piggyback:558.6] Out: 2918.6 [Emesis/NG output:250; HLKTG:2563.6] Total I/O In: 520.1 [I.V.:350.2; NG/GT:120; IV Piggyback:49.9] Out: 534.3 [Other:534.3]  SpO2: 97 % O2 Flow Rate (L/min): 40 L/min FiO2 (%): 32 %   SIGNIFICANT EVENTS 01/22: Pt admitted to the stepdown unit with acute hypoxic respiratory failure secondary to multifocal pneumonia, however remained in the ER pending bed availability 01/23: Pt developed worsening acute hypoxic respiratory failure requiring emergent mechanical intubation in the ER. PCCM contacted to assume care 1/24- parameters for SBT met, patient s/p weaning trial with successful liberation from MV. Updated daughter [name].  1/25 increased WOB and back on biPAP, re-intubated, started on CRRT, family updated multiple times 1/26 severe multiorgan failure, cardiogenic shock, on CRRT, multiple vasopressors 1/27via multiorgan failure and cardiogenic shock CRRT multiple pressors 1/64fo2 increased to 50%, unable to perform SAT/SBT Remains in cardiogenic shock On CRRT and multiple vasopressors 1/30 on CRRT, multiple vasopressors, multiorgan failure 2/01 continues to be dependent on pressors, CRRT and ventilator.  Updated grandson and daughter (Marlaine Hind 2/02 worsening anemia, transfuse 1 unit of PRBCs 2/03 patient DNR palliative Care discussing transitioning to comfort  with family    REVIEW OF  SYSTEMS  PATIENT IS UNABLE TO PROVIDE COMPLETE REVIEW OF SYSTEMS DUE TO SEVERE CRITICAL ILLNESS   PHYSICAL EXAMINATION:  GENERAL:critically ill appearing, intubated mechanically ventilated  HEAD: Normocephalic, atraumatic.  EYES: Pupils equal, round, reactive to light.  Mild scleral icterus.  MOUTH: Orotracheally intubated, OG in place NECK: Supple.  Trachea midline PULMONARY: Coarse sounds with rhonchi in the upper lung zones CARDIOVASCULAR: S1 and S2.  Sinus rhythm on the monitor. No murmurs, rubs, or gallops.Mottled fingertips and toes.   GASTROINTESTINAL: Soft,non-distended.  Positive bowel sounds.  Tolerating tube feeds MUSCULOSKELETAL: No joint swelling, no clubbing, 3+ anasarca NEUROLOGIC: Opens eyes, does not track, unable to do SAT/SBT due to severity of illness SKIN: Generalized mottling, anasarca as noted above  MEDICATIONS: I have reviewed all medications and confirmed regimen as documented   CULTURE RESULTS   No results found for this or any previous visit (from the past 240 hour(s)).      CBC    Component Value Date/Time   WBC 44.0 (H) 07/31/2019 0436   RBC 2.64 (L) 07/31/2019 0436   HGB 8.5 (L) 07/31/2019 0436   HCT 26.9 (L) 07/31/2019 0436   PLT 39 (L) 07/31/2019 0436   MCV 101.9 (H) 07/31/2019 0436   MCH 32.2 07/31/2019 0436   MCHC 31.6 07/31/2019 0436   RDW 23.9 (H) 07/31/2019 0436   LYMPHSABS 3.2 08/04/2019 0447   MONOABS 2.9 (H) 08/15/2019 0447   EOSABS 0.0 08/15/2019 0447   BASOSABS 0.0 08/22/2019 0447   BMP Latest Ref Rng & Units 07/31/2019 08/02/2019 08/04/2019  Glucose 70 - 99 mg/dL 112(H) 177(H) 114(H)  BUN 8 - 23 mg/dL 24(H) 28(H) 33(H)  Creatinine 0.44 - 1.00 mg/dL 0.88 0.83 0.90  Sodium 135 - 145 mmol/L 137 136 137  Potassium 3.5 - 5.1 mmol/L 4.2 4.0 3.4(L)  Chloride 98 - 111 mmol/L 102 102 102  CO2 22 - 32 mmol/L _0 Calcium 8.9 - 10.3 mg/dL 7.9(L) 7.9(L) 8.0(L)      IMAGING    DG Chest Port 1 View  Result Date:  07/31/2019 CLINICAL DATA:  Check endotracheal tube placement EXAM: PORTABLE CHEST 1 VIEW COMPARISON:  07/26/2018 FINDINGS: Cardiac shadow is stable. Postsurgical changes are noted. Endotracheal tube, gastric catheter and right jugular temporary dialysis catheter are noted. Lungs are well aerated with bilateral airspace opacities stable in appearance from the prior exam. Left subclavian arterial stent is noted. IMPRESSION: Tubes and lines as described above. Stable bilateral opacities. Electronically Signed   By: Inez Catalina M.D.   On: 07/31/2019 03:35       Indwelling Urinary Catheter  she is anuric, Foley catheter DC'd 2/2  Reason to continue Indwelling Urinary Catheter   Central Line/ continued, requirement due to  Reason to continue [place] of central venous pressure or other hemodynamic parameters and poor IV access   Ventilator continued, requirement due to severe respiratory failure   Ventilator Sedation RASS 0 to -2      ASSESSMENT AND PLAN SYNOPSIS  SEVERE ACUTE HYPOXIC AND HYPERCAPNIC RESP FAILURE FROM ACUTE COVID 19 PNEUMONITIS AND SEEVERE COPD EXACERBATION WITH SEVERE SYSTOLIC CHF EXACERBATION WITH PROGRESSIVE CARDIORENAL SYNDROME   Severe ACUTE Hypoxic and Hypercapnic Respiratory Failure Continue ventilator support, unable to wean due to severity of multiorgan failure SBT/SAT unable to be performed due to the severity of her critical illness Prognosis is dismal, family has gathered Continue to recommend transitioning to comfort care  ACUTE SYSTOLIC  CARDIAC FAILURE- EF 25% Persistent cardiogenic shock NSTEMI LVEF 25% Afib with RVR, maintained NSR on amiodarone via OG  ACUTE KIDNEY INJURY/Renal Failure On CRRT Continues with issues with volume overload Tolerating poorly, requiring pressors Anuric Foley DC'd 2/2  NEUROLOGY - intubated and sedated - minimal sedation to achieve a RASS goal: -1  SHOCK-SEPSIS/CARDIOGENIC -use vasopressors to keep  MAP>65 -follow ABG and LA On piperacillin tazobactam  CARDIAC ICU monitoring  ID Continue piperacillin tazobactam Persistent leukocytosis due to leukemoid reaction Steroids discontinued  GI GI PROPHYLAXIS as indicated  NUTRITIONAL STATUS DIET-->TF's as tolerated Constipation protocol as indicated  ENDO -On ICU hyperglycemia protocol   ELECTROLYTES -follow labs as needed -replace as needed -pharmacy consultation and following   DVT/GI PRX ordered TRANSFUSIONS AS NEEDED: Transfused 1 unit PRBC 2/2  MONITOR FSBS ASSESS the need for LABS as needed   Critical Care Time devoted to patient care services described in this note is 35 minutes.   Overall, patient is critically ill, prognosis is guarded.  Patient with acute multiorgan failure in the setting of severe pre-existing multiorgan dysfunction and at high risk for cardiac arrest and death.   VERY POOR CHANCE OF MEANINGFUL RECOVERY SHE IS NOW DNR STATUS continue recommend transitioning to comfort care, family considering.  Discussed during multidisciplinary rounds, discussed with Palliative Care, discussed with Dr. Juleen China.   Renold Don, MD [facility]   *This note was dictated using voice recognition software/Dragon.  Despite best efforts to proofread, errors can occur which can change the meaning.  Any change was purely unintentional.

## 2019-08-01 LAB — GLUCOSE, CAPILLARY
Glucose-Capillary: 161 mg/dL — ABNORMAL HIGH (ref 70–99)
Glucose-Capillary: 50 mg/dL — ABNORMAL LOW (ref 70–99)
Glucose-Capillary: 103 mg/dL — ABNORMAL HIGH (ref 70–99)

## 2019-08-01 LAB — RENAL FUNCTION PANEL
Albumin: 2 g/dL — ABNORMAL LOW (ref 3.5–5.0)
Anion gap: 11 (ref 5–15)
BUN: 24 mg/dL — ABNORMAL HIGH (ref 8–23)
CO2: 22 mmol/L (ref 22–32)
Calcium: 7.5 mg/dL — ABNORMAL LOW (ref 8.9–10.3)
Chloride: 103 mmol/L (ref 98–111)
Creatinine, Ser: 0.86 mg/dL (ref 0.44–1.00)
GFR calc Af Amer: >60 mL/min (ref >60)
GFR calc non Af Amer: >60 mL/min (ref >60)
Glucose, Bld: 181 mg/dL — ABNORMAL HIGH (ref 70–99)
Phosphorus: 2.9 mg/dL (ref 2.5–4.6)
Potassium: 4.6 mmol/L (ref 3.5–5.1)
Sodium: 136 mmol/L (ref 135–145)

## 2019-08-01 LAB — MAGNESIUM: Magnesium: 2.3 mg/dL (ref 1.7–2.4)

## 2019-08-01 MED ORDER — GLYCOPYRROLATE 0.2 MG/ML IJ SOLN: 0.2000 mg | INTRAMUSCULAR | Status: DC | PRN

## 2019-08-01 MED ORDER — HALOPERIDOL LACTATE 2 MG/ML PO CONC
0.5000 mg | ORAL | Status: DC | PRN
  Filled 2019-08-01: qty 0.3

## 2019-08-01 MED ORDER — LORAZEPAM 2 MG/ML IJ SOLN
2.0000 mg | INTRAMUSCULAR | Status: DC | PRN
  Administered 2019-08-01: 13:00:00 2 mg via INTRAVENOUS
  Filled 2019-08-01: qty 1

## 2019-08-01 MED ORDER — HYDROMORPHONE HCL 1 MG/ML IJ SOLN: 1.0000 mg | INTRAMUSCULAR | Status: DC | PRN

## 2019-08-01 MED ORDER — GLYCOPYRROLATE 1 MG PO TABS
1.0000 mg | ORAL_TABLET | ORAL | Status: DC | PRN
  Filled 2019-08-01: qty 1

## 2019-08-01 MED ORDER — HALOPERIDOL LACTATE 5 MG/ML IJ SOLN: 0.5000 mg | INTRAMUSCULAR | Status: DC | PRN

## 2019-08-01 MED ORDER — ONDANSETRON 4 MG PO TBDP
4.0000 mg | ORAL_TABLET | Freq: Four times a day (QID) | ORAL | Status: DC | PRN
  Filled 2019-08-01: qty 1

## 2019-08-01 MED ORDER — BIOTENE DRY MOUTH MT LIQD: 15.0000 mL | OROMUCOSAL | Status: DC | PRN

## 2019-08-01 MED ORDER — HALOPERIDOL 0.5 MG PO TABS
0.5000 mg | ORAL_TABLET | ORAL | Status: DC | PRN
  Filled 2019-08-01: qty 1

## 2019-08-01 MED ORDER — ONDANSETRON HCL 4 MG/2ML IJ SOLN: 4.0000 mg | Freq: Four times a day (QID) | INTRAMUSCULAR | Status: DC | PRN

## 2019-08-01 MED ORDER — POLYVINYL ALCOHOL 1.4 % OP SOLN
1.0000 [drp] | Freq: Four times a day (QID) | OPHTHALMIC | Status: DC | PRN
  Filled 2019-08-01: qty 15

## 2019-08-04 LAB — GLUCOSE, CAPILLARY: Glucose-Capillary: 21 mg/dL — CL (ref 70–99)

## 2019-08-25 LAB — BLOOD GAS, VENOUS
Acid-base deficit: 0.4 mmol/L (ref 0.0–2.0)
Bicarbonate: 24.8 mmol/L (ref 20.0–28.0)
O2 Saturation: 55.8 %
Patient temperature: 37
pCO2, Ven: 42 mmHg — ABNORMAL LOW (ref 44.0–60.0)
pH, Ven: 7.38 (ref 7.250–7.430)

## 2019-08-25 NOTE — Progress Notes (Addendum)
CRITICAL CARE NOTE BRIEF PATIENT DESCRIPTION:  74 yo female dx with COVID-19 04/2019 admitted with acute on chronic renal failure with hyperkalemia, elevated troponin, and acute on chronic hypoxic hypercapnic respiratory failure secondary to bacterial pneumonia vs. COVID-19 pneumonitis requiring mechanical intubation   SYNOPSIS This is a 74 yo female with a PMH of HTN, MI, CABG (07/2018)Type II Diabetes Mellitus, COPD, Chronic Home O2 @2L , Chronic Atrial Fibrillation (on eliquis), Chronic Diastolic CHF (EF 40 to 14% via Echo 05/2019), CKD Stage III (baseline creatinine 1.6-1.9), and COVID-19 (dx 05/15/2019). She presented to [facility] ER via EMS on 01/22 with worsening shortness of breath, cough, and fatigue. At baseline pt wears chronic home O2 @2L , however due to symptoms she has had to increase her O2 to 4L 24hrs prior to current ER presentation. En route to the ER EMS placed pt on 6L O2 and administered albuterol. Upon arrival to the ER pt tachypneic with increased work of breathing requiring iv steroids, duonebs, and HFNC. Lab results revealed Na+ 133, CO2 21, BUN 38, creatinine 1.78, glucose 300, anion gap 17, troponin 90, lactic acid 6.2, pct 1.11, wbc 13.7, hgb 8.4, and vbg pH 7.38/pCO2 42. CXR concerning for multifocal pneumonia and small bilateral pleural effusions. She received solumedrol, vancomycin, and cefepime. She was subsequently admitted to the stepdown unit per hospitalist team for additional workup and treatment, however remained in the ER pending bed availability. On 01/23 pt developed worsening acute hypoxic respiratory failure possibly due to flash pulmonary edema after receiving a total of 3L of fluids requiring mechanical ventilation. PCCM contacted to assume care.  Pt does have a hx of COVID-19 diagnosed 05/14/2020, which required hospitalization/treatment at [facility]. Pt discharged home on 05/20/2019. However, she required hospitalization again at [facility] on 05/31/2019  due to multifocal pneumonia, following treatment she was discharged home on 06/29/2019.   CC  follow up respiratory failure  SUBJECTIVE Patient remains critically ill, sedated on the ventilator Continues to be dependent on pressors Continues to be dependent on the ventilator Acute multiorgan failure on chronic severe multiorgan dysfunction  BP (!) 123/56 (BP Location: Right Arm)   Pulse (!) 111   Temp 98 F (36.7 C) (Axillary)   Resp 18   Ht 5' 2"  (1.575 m)   Wt 67.2 kg   SpO2 99%   BMI 27.10 kg/m    I/O last 3 completed shifts: In: 2588.5 [I.V.:2028.6; NG/GT:260; IV Piggyback:299.9] Out: 2816.4 [Emesis/NG output:50; Other:2766.4] Total I/O In: 337.7 [I.V.:232.3; NG/GT:105; IV Piggyback:0.4] Out: 34.5 [Other:34.5]  SpO2: 99 % O2 Flow Rate (L/min): 40 L/min FiO2 (%): 32 %   SIGNIFICANT EVENTS 01/22: Pt admitted to the stepdown unit with acute hypoxic respiratory failure secondary to multifocal pneumonia, however remained in the ER pending bed availability 01/23: Pt developed worsening acute hypoxic respiratory failure requiring emergent mechanical intubation in the ER. PCCM contacted to assume care 1/24- parameters for SBT met, patient s/p weaning trial with successful liberation from MV. Updated daughter [name].  1/25 increased WOB and back on biPAP, re-intubated, started on CRRT, family updated multiple times 1/26 severe multiorgan failure, cardiogenic shock, on CRRT, multiple vasopressors 1/27via multiorgan failure and cardiogenic shock CRRT multiple pressors 1/13fo2 increased to 50%, unable to perform SAT/SBT Remains in cardiogenic shock On CRRT and multiple vasopressors 1/30 on CRRT, multiple vasopressors, multiorgan failure 2/01 continues to be dependent on pressors, CRRT and ventilator.  Updated grandson and daughter (Marlaine Hind 2/02 worsening anemia, transfuse 1 unit of PRBCs 2/03 patient DNR palliative Care discussing  transitioning to comfort with  family 2019-08-04- discussed case with palliative today, plan for possible compassionate weaning from mechanical ventilation    REVIEW OF SYSTEMS  PATIENT IS UNABLE TO PROVIDE COMPLETE REVIEW OF SYSTEMS DUE TO SEVERE CRITICAL ILLNESS   PHYSICAL EXAMINATION:  GENERAL:critically ill appearing, intubated mechanically ventilated  HEAD: Normocephalic, atraumatic.  EYES: Pupils equal, round, reactive to light.  Mild scleral icterus.  MOUTH: Orotracheally intubated, OG in place NECK: Supple.  Trachea midline PULMONARY: Coarse sounds with rhonchi in the upper lung zones CARDIOVASCULAR: S1 and S2.  Sinus rhythm on the monitor. No murmurs, rubs, or gallops.Mottled fingertips and toes.   GASTROINTESTINAL: Soft,non-distended.  Positive bowel sounds.  Tolerating tube feeds MUSCULOSKELETAL: No joint swelling, no clubbing, 3+ anasarca NEUROLOGIC: Opens eyes, does not track, unable to do SAT/SBT due to severity of illness SKIN: Generalized mottling, anasarca as noted above  MEDICATIONS: I have reviewed all medications and confirmed regimen as documented   CULTURE RESULTS   No results found for this or any previous visit (from the past 240 hour(s)).      CBC    Component Value Date/Time   WBC 44.0 (H) 07/31/2019 0436   RBC 2.64 (L) 07/31/2019 0436   HGB 8.5 (L) 07/31/2019 0436   HCT 26.9 (L) 07/31/2019 0436   PLT 39 (L) 07/31/2019 0436   MCV 101.9 (H) 07/31/2019 0436   MCH 32.2 07/31/2019 0436   MCHC 31.6 07/31/2019 0436   RDW 23.9 (H) 07/31/2019 0436   LYMPHSABS 3.2 08/15/2019 0447   MONOABS 2.9 (H) 08/03/2019 0447   EOSABS 0.0 08/20/2019 0447   BASOSABS 0.0 07/29/2019 0447   BMP Latest Ref Rng & Units August 04, 2019 07/31/2019 07/31/2019  Glucose 70 - 99 mg/dL 181(H) 126(H) 112(H)  BUN 8 - 23 mg/dL 24(H) 24(H) 24(H)  Creatinine 0.44 - 1.00 mg/dL 0.86 0.95 0.88  Sodium 135 - 145 mmol/L 136 137 137  Potassium 3.5 - 5.1 mmol/L 4.6 4.4 4.2  Chloride 98 - 111 mmol/L 103 104 102  CO2 22 - 32  mmol/L 22 22 27   Calcium 8.9 - 10.3 mg/dL 7.5(L) 7.7(L) 7.9(L)      IMAGING    No results found.     Indwelling Urinary Catheter  she is anuric, Foley catheter DC'd 2/2  Reason to continue Indwelling Urinary Catheter   Central Line/ continued, requirement due to  Reason to continue [place] of central venous pressure or other hemodynamic parameters and poor IV access   Ventilator continued, requirement due to severe respiratory failure   Ventilator Sedation RASS 0 to -2      ASSESSMENT AND PLAN SYNOPSIS  SEVERE ACUTE HYPOXIC AND HYPERCAPNIC RESP FAILURE FROM ACUTE COVID 19 PNEUMONITIS AND SEEVERE COPD EXACERBATION WITH SEVERE SYSTOLIC CHF EXACERBATION WITH PROGRESSIVE CARDIORENAL SYNDROME   Severe ACUTE Hypoxic and Hypercapnic Respiratory Failure Continue ventilator support, unable to wean due to severity of multiorgan failure SBT/SAT unable to be performed due to the severity of her critical illness Prognosis is dismal, family has gathered comfort care-terminal weaning plan today  ACUTE SYSTOLIC CARDIAC FAILURE- EF 25% Persistent cardiogenic shock NSTEMI LVEF 25% Afib with RVR, maintained NSR on amiodarone via OG  ACUTE KIDNEY INJURY/Renal Failure On CRRT Continues with issues with volume overload Tolerating poorly, requiring pressors Anuric Foley DC'd 2/2  NEUROLOGY - intubated and sedated - minimal sedation to achieve a RASS goal: -1  SHOCK-SEPSIS/CARDIOGENIC -use vasopressors to keep MAP>65 -follow ABG and LA On piperacillin tazobactam  CARDIAC ICU monitoring  ID Continue piperacillin tazobactam Persistent leukocytosis due to leukemoid reaction Steroids discontinued  GI GI PROPHYLAXIS as indicated  NUTRITIONAL STATUS DIET-->TF's as tolerated Constipation protocol as indicated  ENDO -On ICU hyperglycemia protocol   ELECTROLYTES -follow labs as needed -replace as needed -pharmacy consultation and following   DVT/GI PRX  ordered TRANSFUSIONS AS NEEDED: Transfused 1 unit PRBC 2/2  MONITOR FSBS ASSESS the need for LABS as needed   Critical Care Time devoted to patient care services described in this note is 33 minutes.   Overall, patient is critically ill, prognosis is guarded.  Patient with acute multiorgan failure in the setting of severe pre-existing multiorgan dysfunction and at high risk for cardiac arrest and death.   VERY POOR CHANCE OF MEANINGFUL RECOVERY SHE IS NOW DNR STATUS continue-plan for terminal compassionate weaning from MV  Discussed during multidisciplinary rounds, discussed with Palliative Care    Ottie Glazier, M.D.  Pulmonary & [place]    *This note was dictated using voice recognition software/Dragon.  Despite best efforts to proofread, errors can occur which can change the meaning.  Any change was purely unintentional.

## 2019-08-25 NOTE — Progress Notes (Signed)
Notified by Palliative NP Vinie Sill that patient's family had chosen for her to remain at the hospital following her extubation and not discharge home. Referral and pharmacy made aware. Flo Shanks BSN, RN, [place] [phone]

## 2019-08-25 NOTE — Progress Notes (Signed)
The patient was suctioned prior to extubation for a small amount of clear thin secretions. Per Vinie Sill NP's order, she was extubated and placed on 2L for comfort.

## 2019-08-25 NOTE — Progress Notes (Signed)
Pt extubated at 1330 to 2 liters [place] (family request), pressors left on (family request).  Daughters remained at bedside, requested to be alone with their mother, Pt with asystole at 20, confirmed with CN Adalberto Ill, pronounced at bedside.  Daughters encouraged to remain at bedside as long as they need.  CDS notified, patient is suitable for tissue donation.  Family denies need for chaplain.

## 2019-08-25 NOTE — Progress Notes (Signed)
Palliative:  I received message from family late last night that they have decided NOT to take patient home. Will follow up with them tomorrow for extubation.   No charge  Vinie Sill, NP Palliative Medicine Team Pager [phone] (Please see [email or web address] for schedule) Team Phone [phone]

## 2019-08-25 NOTE — Progress Notes (Signed)
Palliative:  HPI: 30HPI:73 y.o.femalewith past medical history of COPD, CHF, atrial fibrillation, CAD s/p CABG 2020, h/o MI, h/o PE, hypertension, CKD stage 3, diabetes, breast cancer in remissionadmitted on 1/22/2021with shortness of breath.Hospitalization 3 times over Nov-Dec 2020 with COVID pneumonia. Reportedly had steady improvement until ~3 days prior to this admission when she became more short of breath and with dry cough and diagnosed with multifocal pneumonia. Acute decompensation and required intubation 1/23 and was extubated 1/24 but now with increased work of breathing and requiring BiPAP. Continues to be high risk for re-intubation with COVID pneumonitis and severe COPD exacerbation.07/21/19 Re-intubated after my discussion and visiton1/26/21 on CRRT and ventsupport as well as requiring vasopressor support and amiodarone infusion.2/2 She continues on vent support and increasing need for vasopressor support; dialysis limited by hypotension.    I met today with daughter, [name]. She had already updated me that they had decided that they decided NOT to take her home as they understand she would not be able to live to make it home. She is less responsive today and daughters have gathered at bedside. Grandchildren have been able to visit to say good bye. Daughters gathering at bedside for extubation and CRRT being stopped. Will continue vasopressors and oxygen after extubation. Myself, Leeann Must, and [name] RT gathered at bedside for extubation. Fentanyl infusion and prn medications available as needed to ensure comfort at end of life. She was extubated successfully and appears comfortable but very minimal breathing effort. Daughters left to privately hold vigil at bedside per their request.   Exam: Unresponsive. Pale. Extubated to nasal cannula. Not much breathing effort post extubation. She appeared comfortable.   All questions/concerns addressed. Emotional support provided.   Plan:  I  was notified by RN that Ms. Schaum died peacefully surrounded by her daughters. I hope they will be able to find peace knowing that they supported and gave her every chance per her wishes.   [place], NP Palliative Medicine Team Pager [phone] (Please see [email or web address] for schedule) Team Phone [phone]    Greater than 50%  of this time was spent counseling and coordinating care related to the above assessment and plan

## 2019-08-25 NOTE — Progress Notes (Signed)
   August 15, 2019 1640  Clinical Encounter Type  Visited With Family  Visit Type Follow-up  Referral From Chaplain  Consult/Referral To Chaplain  Chaplain saw patient's daughters in the parking lot. Chaplain stopped and hugged each of them. One daughter was carried a stuffed bear that her daughter sent to her grandmother to hold whenever she was afraid. This daughter shared that when her mother closed her eyes her expression changed. It was as if she saw her [employer] and [employer]. Another daughter was holding a blanket and pray shawl that Pastoral Care laid on patient. Chaplain told them she will be praying for them. Chaplain offered pastoral presence, empathy, and a brief prayer as she embraced each daughter.

## 2019-08-25 NOTE — Death Summary Note (Signed)
CRITICAL CARE NOTE BRIEF PATIENT DESCRIPTION:    74 yo female dx with COVID-19 04/2019 admitted with acute on chronic renal failure with hyperkalemia, elevated troponin, and acute on chronic hypoxic hypercapnic respiratory failure secondary to bacterial pneumonia vs. COVID-19 pneumonitis requiring mechanical intubation.   After prolonged hospital course, family meeting with numerous providers and palliative care team, family has made choice to proceed with comfort care measures. Today plan per family was to wean off and liberate patient from mechanical ventilation terminally.  She was extubated and passed away at 05/17/1417she rest in peace.    SYNOPSIS This is a 74 yo female with a PMH of HTN, MI, CABG (07/2018)Type II Diabetes Mellitus, COPD, Chronic Home O2 _0 , Chronic Atrial Fibrillation (on eliquis), Chronic Diastolic CHF (EF 40 to 47% via Echo 05/2019), CKD Stage III (baseline creatinine 1.6-1.9), and COVID-19 (dx 05/15/2019). She presented to [facility] ER via EMS on 01/22 with worsening shortness of breath, cough, and fatigue. At baseline pt wears chronic home O2 _1 , however due to symptoms she has had to increase her O2 to 4L 24hrs prior to current ER presentation. En route to the ER EMS placed pt on 6L O2 and administered albuterol. Upon arrival to the ER pt tachypneic with increased work of breathing requiring iv steroids, duonebs, and HFNC. Lab results revealed Na+ 133, CO2 21, BUN 38, creatinine 1.78, glucose 300, anion gap 17, troponin 90, lactic acid 6.2, pct 1.11, wbc 13.7, hgb 8.4, and vbg pH 7.38/pCO2 42. CXR concerning for multifocal pneumonia and small bilateral pleural effusions. She received solumedrol, vancomycin, and cefepime. She was subsequently admitted to the stepdown unit per hospitalist team for additional workup and treatment, however remained in the ER pending bed availability. On 01/23 pt developed worsening acute hypoxic respiratory failure possibly due to flash  pulmonary edema after receiving a total of 3L of fluids requiring mechanical ventilation. PCCM contacted to assume care.  Pt does have a hx of COVID-19 diagnosed 05/14/2020, which required hospitalization/treatment at [facility]. Pt discharged home on 05/20/2019. However, she required hospitalization again at [facility] on 05/31/2019 due to multifocal pneumonia, following treatment she was discharged home on 06/29/2019.   CC  follow up respiratory failure  SUBJECTIVE Patient remains critically ill, sedated on the ventilator Continues to be dependent on pressors Continues to be dependent on the ventilator Acute multiorgan failure on chronic severe multiorgan dysfunction  BP (!) 115/55   Pulse (!) 109   Temp 98 F (36.7 C) (Axillary)   Resp 17   Ht _2  (1.575 m)   Wt 67.2 kg   SpO2 98%   BMI 27.10 kg/m    I/O last 3 completed shifts: In: 2588.5 [I.V.:2028.6; NG/GT:260; IV Piggyback:299.9] Out: 2816.4 [Emesis/NG output:50; Other:2766.4] Total I/O In: 337.7 [I.V.:232.3; NG/GT:105; IV Piggyback:0.4] Out: 34.5 [Other:34.5]  SpO2: 98 % O2 Flow Rate (L/min): 40 L/min FiO2 (%): 32 %   SIGNIFICANT EVENTS 01/22: Pt admitted to the stepdown unit with acute hypoxic respiratory failure secondary to multifocal pneumonia, however remained in the ER pending bed availability 01/23: Pt developed worsening acute hypoxic respiratory failure requiring emergent mechanical intubation in the ER. PCCM contacted to assume care 1/24- parameters for SBT met, patient s/p weaning trial with successful liberation from MV. Updated daughter [name].  1/25 increased WOB and back on biPAP, re-intubated, started on CRRT, family updated multiple times 1/26 severe multiorgan failure, cardiogenic shock, on CRRT, multiple vasopressors 1/27via multiorgan failure and cardiogenic shock CRRT multiple pressors 1/82fo2 increased  to 50%, unable to perform SAT/SBT Remains in cardiogenic shock On CRRT and  multiple vasopressors 1/30 on CRRT, multiple vasopressors, multiorgan failure 2/01 continues to be dependent on pressors, CRRT and ventilator.  Updated grandson and daughter Marlaine Hind) 2/02 worsening anemia, transfuse 1 unit of PRBCs 2/03 patient DNR palliative Care discussing transitioning to comfort with family 2019-08-16- discussed case with palliative today, plan for possible compassionate weaning from mechanical ventilation    REVIEW OF SYSTEMS  PATIENT IS UNABLE TO PROVIDE COMPLETE REVIEW OF SYSTEMS DUE TO SEVERE CRITICAL ILLNESS   PHYSICAL EXAMINATION:  GENERAL:critically ill appearing, intubated mechanically ventilated  HEAD: Normocephalic, atraumatic.  EYES: Pupils equal, round, reactive to light.  Mild scleral icterus.  MOUTH: Orotracheally intubated, OG in place NECK: Supple.  Trachea midline PULMONARY: Coarse sounds with rhonchi in the upper lung zones CARDIOVASCULAR: S1 and S2.  Sinus rhythm on the monitor. No murmurs, rubs, or gallops.Mottled fingertips and toes.   GASTROINTESTINAL: Soft,non-distended.  Positive bowel sounds.  Tolerating tube feeds MUSCULOSKELETAL: No joint swelling, no clubbing, 3+ anasarca NEUROLOGIC: Opens eyes, does not track, unable to do SAT/SBT due to severity of illness SKIN: Generalized mottling, anasarca as noted above  MEDICATIONS: I have reviewed all medications and confirmed regimen as documented   CULTURE RESULTS   No results found for this or any previous visit (from the past 240 hour(s)).      CBC    Component Value Date/Time   WBC 44.0 (H) 07/31/2019 0436   RBC 2.64 (L) 07/31/2019 0436   HGB 8.5 (L) 07/31/2019 0436   HCT 26.9 (L) 07/31/2019 0436   PLT 39 (L) 07/31/2019 0436   MCV 101.9 (H) 07/31/2019 0436   MCH 32.2 07/31/2019 0436   MCHC 31.6 07/31/2019 0436   RDW 23.9 (H) 07/31/2019 0436   LYMPHSABS 3.2 08/12/2019 0447   MONOABS 2.9 (H) 08/04/2019 0447   EOSABS 0.0 08/20/2019 0447   BASOSABS 0.0 08/07/2019 0447   BMP  Latest Ref Rng & Units 08-16-19 07/31/2019 07/31/2019  Glucose 70 - 99 mg/dL 181(H) 126(H) 112(H)  BUN 8 - 23 mg/dL 24(H) 24(H) 24(H)  Creatinine 0.44 - 1.00 mg/dL 0.86 0.95 0.88  Sodium 135 - 145 mmol/L 136 137 137  Potassium 3.5 - 5.1 mmol/L 4.6 4.4 4.2  Chloride 98 - 111 mmol/L 103 104 102  CO2 22 - 32 mmol/L _0 Calcium 8.9 - 10.3 mg/dL 7.5(L) 7.7(L) 7.9(L)      IMAGING    No results found.     Indwelling Urinary Catheter  she is anuric, Foley catheter DC'd 2/2  Reason to continue Indwelling Urinary Catheter   Central Line/ continued, requirement due to  Reason to continue [place] of central venous pressure or other hemodynamic parameters and poor IV access   Ventilator continued, requirement due to severe respiratory failure   Ventilator Sedation RASS 0 to -2      ASSESSMENT AND PLAN SYNOPSIS  SEVERE ACUTE HYPOXIC AND HYPERCAPNIC RESP FAILURE FROM ACUTE COVID 19 PNEUMONITIS AND SEEVERE COPD EXACERBATION WITH SEVERE SYSTOLIC CHF EXACERBATION WITH PROGRESSIVE CARDIORENAL SYNDROME     *This note was dictated using voice recognition software/Dragon.  Despite best efforts to proofread, errors can occur which can change the meaning.  Any change was purely unintentional.

## 2019-08-25 NOTE — Progress Notes (Signed)
[facility]  ROUNDING NOTE   Subjective:   Family is deciding about comfort care today. Daughter at bedside.   Objective:  Vital signs in last 24 hours:  Temp:  [97.8 F (36.6 C)-99.2 F (37.3 C)] 98 F (36.7 C) (02/05 0800) Pulse Rate:  [95-117] 111 (02/05 0900) Resp:  [12-32] 16 (02/05 0900) BP: (77-133)/(39-65) 123/53 (02/05 0900) SpO2:  [93 %-100 %] 95 % (02/05 0900) FiO2 (%):  [23 %-32 %] 32 % (02/05 0800) Weight:  [67.2 kg] 67.2 kg (02/05 0337)  Weight change: -1.9 kg Filed Weights   07/29/2019 0435 07/31/19 0424 22-Aug-2019 0337  Weight: 69.6 kg 69.1 kg 67.2 kg    Intake/Output: I/O last 3 completed shifts: In: 2588.5 [I.V.:2028.6; NG/GT:260; IV Piggyback:299.9] Out: 2816.4 [Emesis/NG output:50; Other:2766.4]   Intake/Output this shift:  Total I/O In: 138.4 [I.V.:88.4; NG/GT:50] Out: 3 [Other:3]  Physical Exam: General: Critically ill   Head: ETT, OGT  Eyes: Anicteric, PERRL  Neck: Supple, trachea midline  Lungs:  PRVC FiO2 32%  Heart: tachycardia  Abdomen:  Soft, nontender,   Extremities:  trace peripheral edema.  Neurologic: Intubated and sedated  Skin: No lesions  Access: Right IJ temp HD 1/25 Dr. Mortimer Fries    Basic Metabolic Panel: Recent Labs  Lab 07/28/19 1655 07/28/19 1624 07/29/19 3748 07/29/19 2707 08/09/2019 8675 08/16/2019 4492 08/10/2019 1649 08/05/2019 1649 07/31/19 0434 07/31/19 0436 07/31/19 1802 22-Aug-2019 0342  NA 136   < >  --    < > 137  --  136  --  137  --  137 136  K 3.6   < >  --    < > 3.4*  --  4.0  --  4.2  --  4.4 4.6  CL 100   < >  --    < > 102  --  102  --  102  --  104 103  CO2 26   < >  --    < > 28  --  29  --  27  --  22 22  GLUCOSE 306*   < >  --    < > 114*  --  177*  --  112*  --  126* 181*  BUN 45*   < >  --    < > 33*  --  28*  --  24*  --  24* 24*  CREATININE 1.20*   < >  --    < > 0.90  --  0.83  --  0.88  --  0.95 0.86  CALCIUM 8.2*   < >  --    < > 8.0*   < > 7.9*   < > 7.9*  --  7.7* 7.5*  MG 2.2  --   2.1  --  2.3  --   --   --   --  2.4  --  2.3  PHOS 2.9   < >  --    < > 1.8*  --  3.2  --  3.0  --  2.6 2.9   < > = values in this interval not displayed.    Liver Function Tests: Recent Labs  Lab 08/08/2019 0447 08/16/2019 1649 07/31/19 0434 07/31/19 1802 22-Aug-2019 0342  ALBUMIN 2.0* 2.0* 2.1* 2.0* 2.0*   No results for input(s): LIPASE, AMYLASE in the last 168 hours. No results for input(s): AMMONIA in the last 168 hours.  CBC: Recent Labs  Lab 07/26/19 0418 07/26/19 0418 07/27/19 0609 07/28/19  3818 07/29/19 0444 08/22/2019 0447 07/31/19 0436  WBC 34.1*   < > 32.0* 36.4* 45.1* 42.2* 44.0*  NEUTROABS 28.3*  --   --   --   --  34.4*  --   HGB 8.0*   < > 7.2* 7.2* 6.7* 8.4* 8.5*  HCT 26.1*   < > 24.3* 23.6* 22.1* 26.4* 26.9*  MCV 97.0   < > 99.6 97.5 102.3* 99.6 101.9*  PLT 242   < > 125* 103* 91* 48* 39*   < > = values in this interval not displayed.    Cardiac Enzymes: No results for input(s): CKTOTAL, CKMB, CKMBINDEX, TROPONINI in the last 168 hours.  BNP: Invalid input(s): POCBNP  CBG: Recent Labs  Lab 07/31/19 2323 07/31/19 2325 07/31/19 2349 08/07/2019 0322 2019/08/07 0715  GLUCAP 69* 50* 299* 161* 65*    Microbiology: Results for orders placed or performed during the hospital encounter of 06/28/2019  Culture, blood (Routine x 2)     Status: None   Collection Time: 07/21/2019  9:40 PM   Specimen: BLOOD  Result Value Ref Range Status   Specimen Description BLOOD RIGHT ANTECUBITAL  Final   Special Requests   Final    BOTTLES DRAWN AEROBIC AND ANAEROBIC Blood Culture adequate volume   Culture   Final    NO GROWTH 5 DAYS Performed at [facility], [address] [place], [place] [address]    Report Status 07/23/2019 FINAL  Final  Culture, blood (Routine x 2)     Status: None   Collection Time: 07/24/2019  9:40 PM   Specimen: BLOOD  Result Value Ref Range Status   Specimen Description BLOOD LEFT ANTECUBITAL  Final   Special Requests   Final     BOTTLES DRAWN AEROBIC AND ANAEROBIC Blood Culture adequate volume   Culture   Final    NO GROWTH 5 DAYS Performed at [facility], [address] [place], [place] [address]    Report Status 07/23/2019 FINAL  Final  MRSA PCR Screening     Status: None   Collection Time: 07/19/19  6:45 AM   Specimen: Nasal Mucosa; Nasopharyngeal  Result Value Ref Range Status   MRSA by PCR NEGATIVE NEGATIVE Final    Comment:        The GeneXpert MRSA Assay (FDA approved for NASAL specimens only), is one component of a comprehensive MRSA colonization surveillance program. It is not intended to diagnose MRSA infection nor to guide or monitor treatment for MRSA infections. Performed at [facility], [address] [place],  [address]   Respiratory Panel by PCR     Status: None   Collection Time: 07/19/19 10:03 AM   Specimen: Nasopharyngeal Swab; Respiratory  Result Value Ref Range Status   Adenovirus NOT DETECTED NOT DETECTED Final   Coronavirus 229E NOT DETECTED NOT DETECTED Final    Comment: (NOTE) The Coronavirus on the Respiratory Panel, DOES NOT test for the novel  Coronavirus (2019 nCoV)    Coronavirus HKU1 NOT DETECTED NOT DETECTED Final   Coronavirus NL63 NOT DETECTED NOT DETECTED Final   Coronavirus OC43 NOT DETECTED NOT DETECTED Final   Metapneumovirus NOT DETECTED NOT DETECTED Final   Rhinovirus / Enterovirus NOT DETECTED NOT DETECTED Final   Influenza A NOT DETECTED NOT DETECTED Final   Influenza B NOT DETECTED NOT DETECTED Final   Parainfluenza Virus 1 NOT DETECTED NOT DETECTED Final   Parainfluenza Virus 2 NOT DETECTED NOT DETECTED Final   Parainfluenza Virus 3  NOT DETECTED NOT DETECTED Final   Parainfluenza Virus 4 NOT DETECTED NOT DETECTED Final   Respiratory Syncytial Virus NOT DETECTED NOT DETECTED Final   Bordetella pertussis NOT DETECTED NOT DETECTED Final   Chlamydophila pneumoniae NOT DETECTED NOT DETECTED Final   Mycoplasma pneumoniae NOT  DETECTED NOT DETECTED Final    Comment: Performed at [facility], [place] [address] [place], [place] [address]    Coagulation Studies: No results for input(s): LABPROT, INR in the last 72 hours.  Urinalysis: No results for input(s): COLORURINE, LABSPEC, PHURINE, GLUCOSEU, HGBUR, BILIRUBINUR, KETONESUR, PROTEINUR, UROBILINOGEN, NITRITE, LEUKOCYTESUR in the last 72 hours.  Invalid input(s): APPERANCEUR    Imaging: DG Chest Port 1 View  Result Date: 07/31/2019 CLINICAL DATA:  Check endotracheal tube placement EXAM: PORTABLE CHEST 1 VIEW COMPARISON:  07/26/2018 FINDINGS: Cardiac shadow is stable. Postsurgical changes are noted. Endotracheal tube, gastric catheter and right jugular temporary dialysis catheter are noted. Lungs are well aerated with bilateral airspace opacities stable in appearance from the prior exam. Left subclavian arterial stent is noted. IMPRESSION: Tubes and lines as described above. Stable bilateral opacities. Electronically Signed   By: Inez Catalina M.D.   On: 07/31/2019 03:35     Medications:   .  prismasol BGK 4/2.5 300 mL/hr at 07/31/19 1730  .  prismasol BGK 4/2.5 200 mL/hr at 07/31/19 1730  . sodium chloride 10 mL/hr at 07/28/19 2000  . dexmedetomidine (PRECEDEX) IV infusion Stopped (09-Aug-2019 0704)  . DOBUTamine Stopped (07/28/19 1802)  . fentaNYL infusion INTRAVENOUS Stopped (08/09/2019 0704)  . norepinephrine (LEVOPHED) Adult infusion 35 mcg/min (2019-08-09 0900)  . piperacillin-tazobactam Stopped (08-09-19 0533)  . prismasol BGK 2/2.5 dialysis solution 1,500 mL/hr at 07/29/19 1409  . prismasol BGK 4/2.5 1,000 mL/hr at Aug 09, 2019 0358  . vasopressin (PITRESSIN) infusion - *FOR SHOCK* 0.03 Units/min (08/09/19 0900)   . amiodarone  200 mg Per Tube BID  . atorvastatin  40 mg Per Tube q1800  . chlorhexidine gluconate (MEDLINE KIT)  15 mL Mouth Rinse BID  . Chlorhexidine Gluconate Cloth  6 each Topical Daily  . famotidine  20 mg Per Tube QHS  . insulin aspart   2-6 Units Subcutaneous Q4H  . ipratropium-albuterol  3 mL Nebulization BID  . mouth rinse  15 mL Mouth Rinse 10 times per day  . midodrine  10 mg Oral TID WC  . polyvinyl alcohol  2 drop Both Eyes TID   albuterol, fentaNYL, heparin, midazolam, morphine injection, phenol, senna-docusate, sodium chloride flush  Assessment/ Plan:  Ms. Andrea [name] is a 74 y.o. 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 Acute renal failure requiring renal replacement therapy on chronic kidney disease stage IIIB with proteinuria: baseline creatinine 1.5, GFR of 34.   Chronic Kidney Disease secondary to diabetes and hypertension Acute renal failure secondary to ATN from sepsis and pneumonia Anuric urine output On CRRT: CVVHDF  2. Anemia with renal failure: with thrombocytopenia. Transfusion 2/2  3. Acute respiratory failure requiring mechanical ventilation: with COPD with chronic oxygen requirements. Acute exacerbation of COPD.   4. Sepsis with hypotension: secondary to pneumonia. COVID-19 negative.  Requiring multiple vasopressors.  - empiric pip/tazo.   Overall prognosis remains critical.  Appreciate palliative care.    LOS: 14  [place] Vanaken 02-23-219:23 AM

## 2019-08-25 NOTE — Progress Notes (Signed)
UF off at this time d/t amount pulled off last night and also hypotension/increasing pressor support

## 2019-08-25 NOTE — Progress Notes (Signed)
   Aug 29, 2019 1400  Clinical Encounter Type  Visited With Patient and family together  Visit Type Follow-up  Referral From Chaplain  Consult/Referral To Chaplain  Chaplain was on her way to see patient and family and was told that they did not want to see a Chaplain. Chaplain just stuck her head in the door, said hello, and left. The fact that they did not ask Chaplain in [place] knew family was going through a rough time. While charging, Chaplain was informed of patient's passing.

## 2019-08-25 DEATH — deceased

## 2019-09-02 ENCOUNTER — Ambulatory Visit: Payer: Medicare Other | Admitting: Family
